# Patient Record
Sex: Male | Born: 1940 | Race: White | Hispanic: No | Marital: Married | Smoking: Never smoker
Health system: Southern US, Community
[De-identification: ages and names within clinical notes are randomized; demographics above are authoritative.]

## PROBLEM LIST (undated history)

## (undated) DIAGNOSIS — S065X9A Traumatic subdural hemorrhage with loss of consciousness of unspecified duration, initial encounter: Secondary | ICD-10-CM

## (undated) DIAGNOSIS — E785 Hyperlipidemia, unspecified: Secondary | ICD-10-CM

## (undated) DIAGNOSIS — I10 Essential (primary) hypertension: Secondary | ICD-10-CM

## (undated) DIAGNOSIS — I493 Ventricular premature depolarization: Secondary | ICD-10-CM

## (undated) DIAGNOSIS — E1140 Type 2 diabetes mellitus with diabetic neuropathy, unspecified: Secondary | ICD-10-CM

## (undated) DIAGNOSIS — M1990 Unspecified osteoarthritis, unspecified site: Secondary | ICD-10-CM

## (undated) DIAGNOSIS — I2510 Atherosclerotic heart disease of native coronary artery without angina pectoris: Secondary | ICD-10-CM

## (undated) DIAGNOSIS — E669 Obesity, unspecified: Secondary | ICD-10-CM

## (undated) DIAGNOSIS — N189 Chronic kidney disease, unspecified: Secondary | ICD-10-CM

## (undated) DIAGNOSIS — S301XXA Contusion of abdominal wall, initial encounter: Secondary | ICD-10-CM

## (undated) HISTORY — DX: Essential (primary) hypertension: I10

## (undated) HISTORY — DX: Contusion of abdominal wall, initial encounter: S30.1XXA

## (undated) HISTORY — DX: Hyperlipidemia, unspecified: E78.5

## (undated) HISTORY — DX: Ventricular premature depolarization: I49.3

## (undated) HISTORY — DX: Traumatic subdural hemorrhage with loss of consciousness of unspecified duration, initial encounter: S06.5X9A

## (undated) HISTORY — DX: Chronic kidney disease, unspecified: N18.9

## (undated) HISTORY — PX: HYPOTHENAR FAT PAD TRANSFER: SHX6408

## (undated) HISTORY — DX: Obesity, unspecified: E66.9

## (undated) HISTORY — DX: Type 2 diabetes mellitus with diabetic neuropathy, unspecified: E11.40

## (undated) NOTE — *Deleted (*Deleted)
NAME:  Roberto Haley, MRN:  [id number], DOB:  12/28/1940, LOS: 0 ADMISSION DATE:  06/28/2020, CONSULTATION DATE:  11/9 REFERRING MD:  Dr. Lorrin Goodell (Neurology), CHIEF COMPLAINT:  COVID/Stroke   Brief History   72 year old male admitted to [facility] with COVID pneumonia. He had abrupt change in mental status diagnosed as stroke. Transferred to [name] 11/9 for MRI/MRA.  History of present illness   28 year old male with PMH as below, which is significant for CKD, DM, HTN, and DM2. He has received both doses of [place] vaccination. Presented to [facility] ED 11/4 with complaints of SOB with oxygen saturations found to be in the 60s on room air. COVID tested positive. He was admitted and quickly escalated to requirement of 100% NRB. Treatment regimen included decadron, remdesivir, and baricitinib. He initially ahd improved and oxygen was weaning down. 11/8 he had acute change in mental status and CT was concerning for stroke. 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 No CTA due to contrast allergy. The outside hospital was unable to obtain MRA for COVID positive patient, and thus he was transferred to [facility] ICU for further evaluation. He was admitted to the neurology service and PCCM was consulted for concerns of respiratory decline.   Past Medical History   has a past medical history of Arthritis, Chronic kidney disease, Coronary artery disease, Diabetes mellitus, Groin hematoma (02/19/12), Hyperlipidemia, Hypertension, Neuropathy due to type 2 diabetes mellitus ([place]), Obesity, PVC (premature ventricular contraction), and SDH (subdural hematoma) ([place]) (02/06/2013).   Significant Hospital Events   11/4 admit 11/9 tx to [facility]  Consults:    Procedures:    Significant Diagnostic Tests:  11/8 CT head > Low-density in the left occipital lobe suggesting acute to subacute infarction.  Possible infarction in inferior left cerebellum.  No hemorrhage  or mass-effect.  Chronic small vessel ischemic changes elsewhere throughout the brain. 11/9 MRI/MRA >   Micro Data:    Antimicrobials:  ***  Interim history/subjective:    Objective   Blood pressure (!) 146/85, pulse (!) 106, temperature 98.1 F (36.7 C), temperature source Axillary, resp. rate (!) 26, SpO2 (!) 87 %.    FiO2 (%):  [100 %] 100 %   Intake/Output Summary (Last 24 hours) at 06/12/2020 2158 Last data filed at 06/15/2020 1900 Gross per 24 hour  Intake -  Output 50 ml  Net -50 ml   There were no vitals filed for this visit.  Examination: General: *** HENT: *** Lungs: *** Cardiovascular: *** Abdomen: *** Extremities: *** Neuro: *** GU: ***  Resolved Hospital Problem list     Assessment & Plan:   Acute hypoxemic respiratory failure secondary to COVID-10 pneumonia. - Supplemental O2 to keep SpO2 > 88% - may require intubation - ongoing steroids, baricitinib - s/p 5 days remdesivir   CVA - MRI pending - Plan per neurology  PAF on Eliquis CAD s/p CABG - Will transition to heparin infusion as he is not able to take PO - Amiodarone   Hypothyroid - home synthroid   DM - hold metformin - CBG monitoring and SSI   Best practice:  Diet: NPO Pain/Anxiety/Delirium protocol (if indicated): *** VAP protocol (if indicated): *** DVT prophylaxis: heparin infusion GI prophylaxis: PPI Glucose control: CBG monitoring and SSI Mobility: BR Code Status: FULL COBE Family Communication: *** Disposition: ICU  Labs   CBC: No results for input(s): WBC, NEUTROABS, HGB, HCT, MCV, PLT in the last 168 hours.  Basic Metabolic Panel: No results for input(s): NA, K, CL, CO2, GLUCOSE, BUN, CREATININE, CALCIUM, MG, PHOS in the last 168 hours. GFR: CrCl cannot be calculated (Patient's most recent lab result is older than the maximum 21 days allowed.). No results for input(s): PROCALCITON, WBC, LATICACIDVEN in the last 168 hours.  Liver Function Tests: No  results for input(s): AST, ALT, ALKPHOS, BILITOT, PROT, ALBUMIN in the last 168 hours. No results for input(s): LIPASE, AMYLASE in the last 168 hours. No results for input(s): AMMONIA in the last 168 hours.  ABG No results found for: PHART, PCO2ART, PO2ART, HCO3, TCO2, ACIDBASEDEF, O2SAT   Coagulation Profile: No results for input(s): INR, PROTIME in the last 168 hours.  Cardiac Enzymes: No results for input(s): CKTOTAL, CKMB, CKMBINDEX, TROPONINI in the last 168 hours.  HbA1C: Hgb A1c MFr Bld  Date/Time Value Ref Range Status  11/01/2014 12:00 AM 10.7 (A) 4.0 - 6.0 % Final    CBG: Recent Labs  Lab 06/10/2020 2107  GLUCAP 204*    Review of Systems:   ***  Past Medical History  He,  has a past medical history of Arthritis, Chronic kidney disease, Coronary artery disease, Diabetes mellitus, Groin hematoma (02/19/12), Hyperlipidemia, Hypertension, Neuropathy due to type 2 diabetes mellitus ([place]), Obesity, PVC (premature ventricular contraction), and SDH (subdural hematoma) ([place]) (02/06/2013).   Surgical History    Past Surgical History:  Procedure Laterality Date  . CARDIAC CATHETERIZATION  01/14/12   LV FXN EF 50-55% W/MILD DISTAL INFERIOR HYPOKINESIS; PCI: LAD PTCA/STENT W/NEW 2.75X22 RESOLUTE DES IN MID LAD POST DIALTED TO 3.08 TO 2.97 TAPER: 99%-80% TO 0; LCX: PTCA VIA LM STENT W/2.25X12 SPRINTER BALLOON: 90%-5; LAD: PATENT PROXIMAL STENT EXTENDING INTO LM W/30-40% SMOOTH NARROWING IN DISTAL PORTION OF STENT; 99% IN STENT RESTENOSIS OF MID LAD STENT   . CARDIAC CATHETERIZATION N/A 12/02/2015   Procedure: Left Heart Cath and Coronary Angiography;  Surgeon: Troy Sine, MD;  Location: [place] CV LAB;  Service: Cardiovascular;  Laterality: N/A;  . CARDIAC CATHETERIZATION N/A 12/02/2015   Procedure: Coronary Stent Intervention;  Surgeon: Troy Sine, MD;  Location: [place] CV LAB;  Service: Cardiovascular;  Laterality: N/A;  . CARDIOVERSION N/A 09/26/2018   Procedure:  CARDIOVERSION;  Surgeon: Troy Sine, MD;  Location: [facility] ENDOSCOPY;  Service: Cardiovascular;  Laterality: N/A;  . CORONARY ARTERY BYPASS GRAFT  1997   LIMA to the LAD and vein to the RCA;  . CORONARY STENT PLACEMENT  12/02/2015   PAD  . DOPPLER ECHOCARDIOGRAPHY  01/09/12   LV NORMAL IN SIZE, NORMAL WAL THICKNESS, EF 50-55%; MILD POSTERIOR WALL HYOPKINESIS, THERE IS MILD INFERIOR WALL HYPOKINESIS  . HYPOTHENAR FAT PAD TRANSFER    . LEFT HEART CATHETERIZATION WITH CORONARY/GRAFT ANGIOGRAM N/A 01/14/2012   Procedure: LEFT HEART CATHETERIZATION WITH Beatrix Fetters;  Surgeon: Troy Sine, MD;  Location: [facility] CATH LAB;  Service: Cardiovascular;  Laterality: N/A;  . LOWER ARTERIAL DOPPLER  01/24/12   ESSENTIALLY NORMAL RIGHT GROIN DUPLEX EVALUATION S/P THROMBIN INJECTION  . LOWER VENOUS DUPLEX  02/05/12   ESSENTIALLY NORMAL RIGHT LOWER EXTRIMTY VENOUS DUPLEX DOPPLER EVALUATION  . NM MYOVIEW LTD  01/09/12   HIGH RISK SCAN. COMPARED TO PREVIOUS STUDY, THERE IS NOW ISCHEMIA PRESENT. ABNORMAL MYOCARDIAL PERFUSION STUDY. THERE IS NEW MILD INFEROLATERAL ISCHEMIA TOWARDS THE BASE; PT TO FOLLOW UP WITH DR. [name]  . PERCUTANEOUS CORONARY STENT INTERVENTION (PCI-S) N/A 01/14/2012   Procedure: PERCUTANEOUS CORONARY STENT INTERVENTION (PCI-S);  Surgeon: Troy Sine, MD;  Location: [place] CATH LAB;  Service: Cardiovascular;  Laterality: N/A;     Social History   reports that he has never smoked. He has never used smokeless tobacco. He reports that he does not drink alcohol and does not use drugs.   Family History   His family history includes Cancer in his father; Diabetes in his paternal aunt. There is no history of Heart disease.   Allergies Allergies  Allergen Reactions  . Diltiazem Other (See Comments)    Unknown  . Lovastatin Other (See Comments)    Unknown  . Proton Pump Inhibitors Other (See Comments)    Unknown   . Tramadol Nausea And Vomiting  . Codeine Rash     Home Medications  Prior  to Admission medications   Medication Sig Start Date End Date Taking? Authorizing Provider  atorvastatin (LIPITOR) 40 MG tablet TAKE 1 TABLET(40 MG) BY MOUTH DAILY 05/06/20   Troy Sine, MD  acetaminophen (TYLENOL) 500 MG tablet Take 500 mg by mouth every 6 (six) hours as needed.    [provider]  amiodarone (PACERONE) 200 MG tablet TAKE 1 TABLET(200 MG) BY MOUTH TWICE DAILY 06/06/20   Troy Sine, MD  amLODipine (NORVASC) 5 MG tablet Take 1 tablet (5 mg total) by mouth daily. 03/14/20 06/12/20  Troy Sine, MD  benazepril (LOTENSIN) 40 MG tablet Take 1 tablet by mouth daily. 01/19/19   [provider]  cholecalciferol (VITAMIN D) 1000 UNITS tablet Take 1,000 Units by mouth daily.    [provider]  clopidogrel (PLAVIX) 75 MG tablet TAKE 1 TABLET(75 MG) BY MOUTH DAILY WITH BREAKFAST 01/05/20   Troy Sine, MD  DUREZOL 0.05 % EMUL  06/23/19   [provider]  ELIQUIS 5 MG TABS tablet TAKE 1 TABLET(5 MG) BY MOUTH TWICE DAILY 04/04/20   Troy Sine, MD  furosemide (LASIX) 40 MG tablet Take one tablet (40 mg) by mouth every morning and half a tablet (20 mg) by mouth EVERY afternoon. 04/22/20   Troy Sine, MD  hydrALAZINE (APRESOLINE) 50 MG tablet TAKE 1 AND 1/2 TABLETS(75 MG) BY MOUTH TWICE DAILY 06/06/20   Troy Sine, MD  insulin aspart (NOVOLOG) 100 UNIT/ML FlexPen Inject 14 Units into the skin 2 (two) times daily with a meal.     [provider]  isosorbide mononitrate (IMDUR) 60 MG 24 hr tablet TAKE 1 AND 1/2 TABLETS BY MOUTH EVERY AM AND TAKE 0.5 TABLET BY MOUTH EVERY EVENING 01/05/20   Troy Sine, MD  ketoconazole (NIZORAL) 2 % cream Apply 1 fingertip amount to each foot daily. 12/29/19   Evelina Bucy, DPM  Lancets (ONETOUCH DELICA PLUS [id number]) MISC USE AS DIRECTED TO TEST BLOOD GLUCOSE BID 05/17/19   [provider]  LEVEMIR FLEXTOUCH 100 UNIT/ML Pen Inject 44 Units into the skin 2 (two) times daily.  10/07/14    [provider]  levothyroxine (SYNTHROID) 150 MCG tablet Take 150 mcg by mouth every morning. 10/13/19   [provider]  LINZESS 145 MCG CAPS capsule Take 145 mcg by mouth daily before breakfast.  11/15/15   [provider]  metFORMIN (GLUCOPHAGE-XR) 750 MG 24 hr tablet Take 750 mg by mouth daily with breakfast.    [provider]  metoprolol succinate (TOPROL-XL) 50 MG 24 hr tablet TAKE 1 TABLET(50 MG) BY MOUTH DAILY 04/15/20   Troy Sine, MD  Multiple Vitamin (MULTIVITAMIN WITH MINERALS) TABS Take 1 tablet by mouth daily.  [provider]  nitroGLYCERIN (NITROSTAT) 0.4 MG SL tablet Place 1 tablet (0.4 mg total) under the tongue every 5 (five) minutes as needed. For chest pain 12/03/15 04/22/20  Barrett, Evelene Croon, PA-C  NOVOFINE AUTOCOVER 30G X 8 MM MISC  05/26/14   [provider]  ofloxacin (OCUFLOX) 0.3 % ophthalmic solution  06/23/19   [provider]  ONE TOUCH ULTRA TEST test strip  09/01/14   [provider]  oxybutynin (DITROPAN) 5 MG tablet Take 1 tablet by mouth daily. 06/26/13   [provider]  potassium chloride SA (KLOR-CON) 20 MEQ tablet TAKE 1 TABLET BY MOUTH DAILY AS NEEDED 02/09/20   Almyra Deforest, PA  TRULICITY 1.5 0000000 SOPN Inject 1.5 mg as directed once a week.  03/25/15   [provider]  vitamin C (ASCORBIC ACID) 500 MG tablet Take 500 mg by mouth daily.    [provider]     Critical care time: ***      Georgann Housekeeper, AGACNP-BC [place] for personal pager PCCM on call pager [phone]  06/28/2020 9:58 PM

---

## 1995-08-07 HISTORY — PX: CORONARY ARTERY BYPASS GRAFT: SHX141

## 2002-11-16 ENCOUNTER — Inpatient Hospital Stay (HOSPITAL_COMMUNITY): Admission: EM | Admit: 2002-11-16 | Discharge: 2002-11-18 | Payer: Self-pay | Admitting: Emergency Medicine

## 2011-09-26 DIAGNOSIS — E119 Type 2 diabetes mellitus without complications: Secondary | ICD-10-CM | POA: Diagnosis not present

## 2011-09-26 DIAGNOSIS — L84 Corns and callosities: Secondary | ICD-10-CM | POA: Diagnosis not present

## 2011-10-23 DIAGNOSIS — J019 Acute sinusitis, unspecified: Secondary | ICD-10-CM | POA: Diagnosis not present

## 2011-11-07 DIAGNOSIS — Z125 Encounter for screening for malignant neoplasm of prostate: Secondary | ICD-10-CM | POA: Diagnosis not present

## 2011-11-07 DIAGNOSIS — E785 Hyperlipidemia, unspecified: Secondary | ICD-10-CM | POA: Diagnosis not present

## 2011-11-07 DIAGNOSIS — Z79899 Other long term (current) drug therapy: Secondary | ICD-10-CM | POA: Diagnosis not present

## 2011-11-07 DIAGNOSIS — I10 Essential (primary) hypertension: Secondary | ICD-10-CM | POA: Diagnosis not present

## 2011-11-07 DIAGNOSIS — E119 Type 2 diabetes mellitus without complications: Secondary | ICD-10-CM | POA: Diagnosis not present

## 2011-11-07 DIAGNOSIS — Z6835 Body mass index (BMI) 35.0-35.9, adult: Secondary | ICD-10-CM | POA: Diagnosis not present

## 2011-12-24 DIAGNOSIS — E785 Hyperlipidemia, unspecified: Secondary | ICD-10-CM | POA: Diagnosis not present

## 2011-12-24 DIAGNOSIS — R079 Chest pain, unspecified: Secondary | ICD-10-CM | POA: Diagnosis not present

## 2011-12-24 DIAGNOSIS — I2510 Atherosclerotic heart disease of native coronary artery without angina pectoris: Secondary | ICD-10-CM | POA: Diagnosis not present

## 2011-12-24 DIAGNOSIS — E119 Type 2 diabetes mellitus without complications: Secondary | ICD-10-CM | POA: Diagnosis not present

## 2012-01-09 DIAGNOSIS — R0602 Shortness of breath: Secondary | ICD-10-CM | POA: Diagnosis not present

## 2012-01-09 DIAGNOSIS — E785 Hyperlipidemia, unspecified: Secondary | ICD-10-CM | POA: Diagnosis not present

## 2012-01-09 DIAGNOSIS — R079 Chest pain, unspecified: Secondary | ICD-10-CM | POA: Diagnosis not present

## 2012-01-09 DIAGNOSIS — I2510 Atherosclerotic heart disease of native coronary artery without angina pectoris: Secondary | ICD-10-CM | POA: Diagnosis not present

## 2012-01-09 DIAGNOSIS — R0609 Other forms of dyspnea: Secondary | ICD-10-CM | POA: Diagnosis not present

## 2012-01-09 DIAGNOSIS — R42 Dizziness and giddiness: Secondary | ICD-10-CM | POA: Diagnosis not present

## 2012-01-09 DIAGNOSIS — Z79899 Other long term (current) drug therapy: Secondary | ICD-10-CM | POA: Diagnosis not present

## 2012-01-09 HISTORY — PX: DOPPLER ECHOCARDIOGRAPHY: SHX263

## 2012-01-09 HISTORY — PX: NM MYOVIEW LTD: HXRAD82

## 2012-01-11 ENCOUNTER — Ambulatory Visit
Admission: RE | Admit: 2012-01-11 | Discharge: 2012-01-11 | Disposition: A | Discharge status: Home / Self Care | Payer: Medicare Other | Source: Ambulatory Visit | Attending: Cardiovascular Disease | Admitting: Cardiovascular Disease

## 2012-01-11 ENCOUNTER — Other Ambulatory Visit: Payer: Self-pay | Admitting: Cardiovascular Disease

## 2012-01-11 DIAGNOSIS — J984 Other disorders of lung: Secondary | ICD-10-CM | POA: Diagnosis not present

## 2012-01-11 DIAGNOSIS — Z01811 Encounter for preprocedural respiratory examination: Secondary | ICD-10-CM

## 2012-01-11 DIAGNOSIS — E119 Type 2 diabetes mellitus without complications: Secondary | ICD-10-CM | POA: Diagnosis not present

## 2012-01-11 DIAGNOSIS — R9430 Abnormal result of cardiovascular function study, unspecified: Secondary | ICD-10-CM | POA: Diagnosis not present

## 2012-01-11 DIAGNOSIS — I517 Cardiomegaly: Secondary | ICD-10-CM | POA: Diagnosis not present

## 2012-01-11 DIAGNOSIS — D689 Coagulation defect, unspecified: Secondary | ICD-10-CM | POA: Diagnosis not present

## 2012-01-11 DIAGNOSIS — Z01818 Encounter for other preprocedural examination: Secondary | ICD-10-CM | POA: Diagnosis not present

## 2012-01-11 DIAGNOSIS — R079 Chest pain, unspecified: Secondary | ICD-10-CM | POA: Diagnosis not present

## 2012-01-11 IMAGING — CR DG CHEST 2V
2 series · 2 of 2 positions shown · non-contrast
Comparison: None.

CLINICAL DATA: Preop for cardiac catheterization.  Chest pain and
shortness of breath.  Prior median sternotomy for CABG.

CHEST - 2 VIEW

[w chest pa]
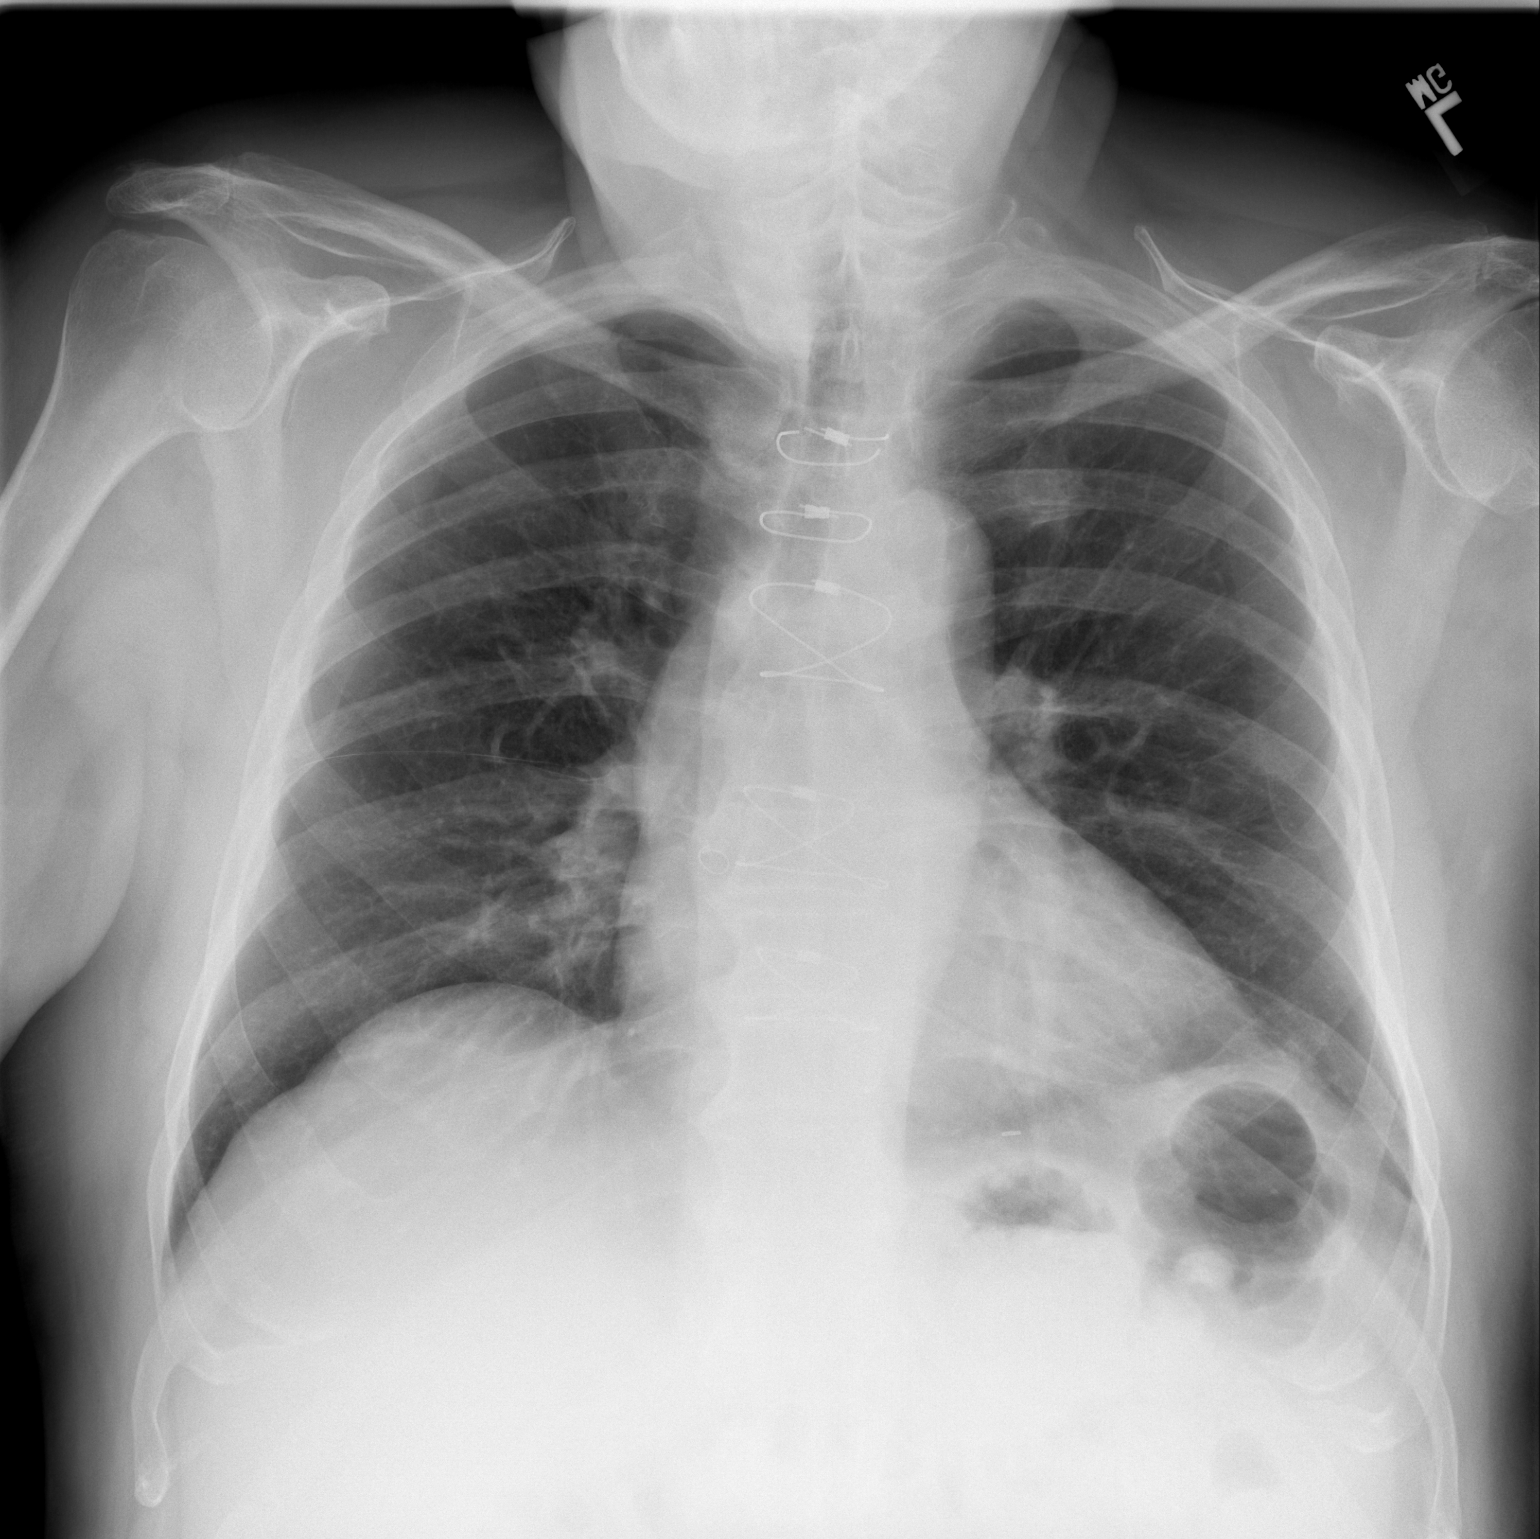

[w chest lat]
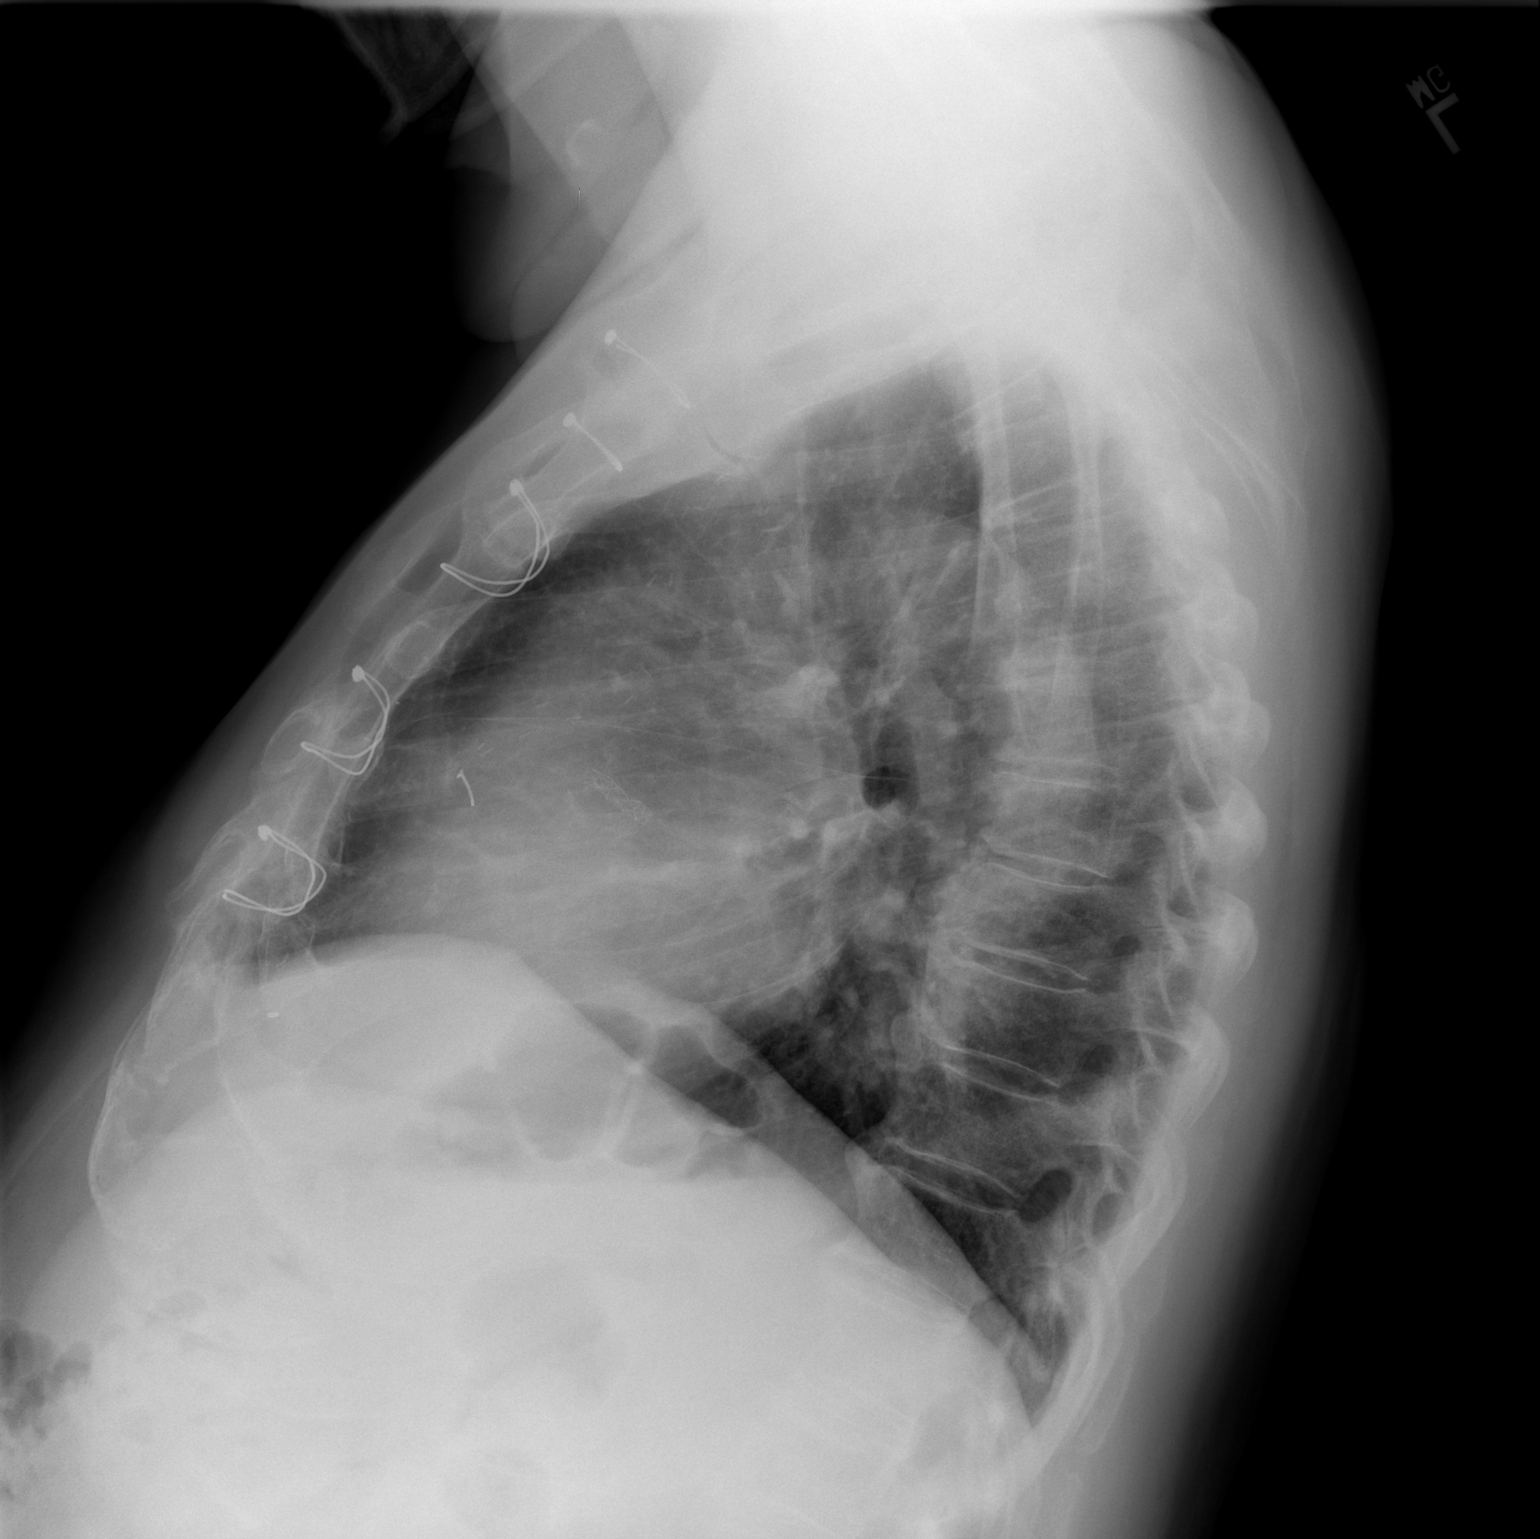

[2 of 2 positions shown; findings below may reference images not displayed]

FINDINGS: Prior median sternotomy.  Moderate lower thoracic
spondylosis.  The superior-most mediastinal wire is fractured.
Mild cardiomegaly. Mediastinal contours otherwise within normal
limits.  No congestive failure.

Mild left base volume loss/scarring.
IMPRESSION: Cardiomegaly without congestive failure.

## 2012-01-14 ENCOUNTER — Ambulatory Visit (HOSPITAL_COMMUNITY)
Admission: RE | Admit: 2012-01-14 | Discharge: 2012-01-16 | Disposition: A | Discharge status: Home / Self Care | Payer: Medicare Other | Source: Ambulatory Visit | Attending: Cardiovascular Disease | Admitting: Cardiovascular Disease

## 2012-01-14 ENCOUNTER — Encounter (HOSPITAL_COMMUNITY): Payer: Self-pay | Admitting: *Deleted

## 2012-01-14 ENCOUNTER — Encounter: Payer: Self-pay | Admitting: Cardiovascular Disease

## 2012-01-14 ENCOUNTER — Encounter (HOSPITAL_COMMUNITY)
Admission: RE | Disposition: A | Discharge status: Home / Self Care | Payer: Self-pay | Source: Ambulatory Visit | Attending: Cardiovascular Disease

## 2012-01-14 DIAGNOSIS — E119 Type 2 diabetes mellitus without complications: Secondary | ICD-10-CM | POA: Insufficient documentation

## 2012-01-14 DIAGNOSIS — I2510 Atherosclerotic heart disease of native coronary artery without angina pectoris: Secondary | ICD-10-CM | POA: Diagnosis not present

## 2012-01-14 DIAGNOSIS — N289 Disorder of kidney and ureter, unspecified: Secondary | ICD-10-CM | POA: Diagnosis present

## 2012-01-14 DIAGNOSIS — I25810 Atherosclerosis of coronary artery bypass graft(s) without angina pectoris: Secondary | ICD-10-CM | POA: Diagnosis not present

## 2012-01-14 DIAGNOSIS — E785 Hyperlipidemia, unspecified: Secondary | ICD-10-CM | POA: Insufficient documentation

## 2012-01-14 DIAGNOSIS — R9430 Abnormal result of cardiovascular function study, unspecified: Secondary | ICD-10-CM | POA: Diagnosis not present

## 2012-01-14 DIAGNOSIS — T82897A Other specified complication of cardiac prosthetic devices, implants and grafts, initial encounter: Secondary | ICD-10-CM | POA: Insufficient documentation

## 2012-01-14 DIAGNOSIS — Y831 Surgical operation with implant of artificial internal device as the cause of abnormal reaction of the patient, or of later complication, without mention of misadventure at the time of the procedure: Secondary | ICD-10-CM | POA: Insufficient documentation

## 2012-01-14 DIAGNOSIS — R079 Chest pain, unspecified: Secondary | ICD-10-CM | POA: Diagnosis not present

## 2012-01-14 DIAGNOSIS — I10 Essential (primary) hypertension: Secondary | ICD-10-CM | POA: Diagnosis present

## 2012-01-14 DIAGNOSIS — Z955 Presence of coronary angioplasty implant and graft: Secondary | ICD-10-CM

## 2012-01-14 DIAGNOSIS — I493 Ventricular premature depolarization: Secondary | ICD-10-CM | POA: Diagnosis present

## 2012-01-14 HISTORY — PX: PERCUTANEOUS CORONARY STENT INTERVENTION (PCI-S): SHX5485

## 2012-01-14 HISTORY — DX: Atherosclerotic heart disease of native coronary artery without angina pectoris: I25.10

## 2012-01-14 HISTORY — PX: LEFT HEART CATHETERIZATION WITH CORONARY/GRAFT ANGIOGRAM: SHX5450

## 2012-01-14 HISTORY — PX: CARDIAC CATHETERIZATION: SHX172

## 2012-01-14 LAB — GLUCOSE, CAPILLARY: Glucose-Capillary: 189 mg/dL — ABNORMAL HIGH (ref 70–99)

## 2012-01-14 SURGERY — LEFT HEART CATHETERIZATION WITH CORONARY/GRAFT ANGIOGRAM
Anesthesia: LOCAL

## 2012-01-14 MED ORDER — MIDAZOLAM HCL 2 MG/2ML IJ SOLN
INTRAMUSCULAR | Status: AC
  Filled 2012-01-14: qty 2

## 2012-01-14 MED ORDER — SODIUM CHLORIDE 0.9 % IV SOLN
INTRAVENOUS | Status: DC
  Administered 2012-01-14: 08:00:00 via INTRAVENOUS

## 2012-01-14 MED ORDER — SODIUM CHLORIDE 0.9 % IV SOLN: 250.0000 mL | INTRAVENOUS | Status: DC | PRN

## 2012-01-14 MED ORDER — SODIUM CHLORIDE 0.9 % IJ SOLN: 3.0000 mL | Freq: Two times a day (BID) | INTRAMUSCULAR | Status: DC

## 2012-01-14 MED ORDER — ACETAMINOPHEN 325 MG PO TABS
650.0000 mg | ORAL_TABLET | ORAL | Status: DC | PRN
  Administered 2012-01-15: 650 mg via ORAL
  Filled 2012-01-14: qty 2

## 2012-01-14 MED ORDER — CLOPIDOGREL BISULFATE 75 MG PO TABS: 75.0000 mg | ORAL_TABLET | Freq: Every day | ORAL | Status: DC

## 2012-01-14 MED ORDER — FELODIPINE ER 5 MG PO TB24
5.0000 mg | ORAL_TABLET | Freq: Every day | ORAL | Status: DC
  Administered 2012-01-15 – 2012-01-16 (×2): 5 mg via ORAL
  Filled 2012-01-14 (×3): qty 1

## 2012-01-14 MED ORDER — DIAZEPAM 5 MG PO TABS
ORAL_TABLET | ORAL | Status: AC
  Administered 2012-01-14: 5 mg
  Filled 2012-01-14: qty 1

## 2012-01-14 MED ORDER — SODIUM CHLORIDE 0.9 % IV SOLN
1.7500 mg/kg/h | INTRAVENOUS | Status: AC
  Administered 2012-01-14: 1.75 mg/kg/h via INTRAVENOUS
  Filled 2012-01-14: qty 250

## 2012-01-14 MED ORDER — BIVALIRUDIN 250 MG IV SOLR
INTRAVENOUS | Status: AC
  Filled 2012-01-14: qty 250

## 2012-01-14 MED ORDER — FENTANYL CITRATE 0.05 MG/ML IJ SOLN
INTRAMUSCULAR | Status: AC
  Filled 2012-01-14: qty 2

## 2012-01-14 MED ORDER — ATORVASTATIN CALCIUM 40 MG PO TABS
40.0000 mg | ORAL_TABLET | Freq: Every day | ORAL | Status: DC
  Filled 2012-01-14 (×3): qty 1

## 2012-01-14 MED ORDER — RANOLAZINE ER 500 MG PO TB12
500.0000 mg | ORAL_TABLET | Freq: Two times a day (BID) | ORAL | Status: DC
  Administered 2012-01-14 – 2012-01-16 (×4): 500 mg via ORAL
  Filled 2012-01-14 (×8): qty 1

## 2012-01-14 MED ORDER — NITROGLYCERIN 0.2 MG/ML ON CALL CATH LAB
INTRAVENOUS | Status: AC
  Filled 2012-01-14: qty 1

## 2012-01-14 MED ORDER — ATORVASTATIN CALCIUM 40 MG PO TABS
40.0000 mg | ORAL_TABLET | Freq: Every day | ORAL | Status: DC
  Filled 2012-01-14 (×2): qty 1

## 2012-01-14 MED ORDER — MORPHINE SULFATE 4 MG/ML IJ SOLN: 2.0000 mg | INTRAMUSCULAR | Status: DC | PRN

## 2012-01-14 MED ORDER — HEPARIN (PORCINE) IN NACL 2-0.9 UNIT/ML-% IJ SOLN
INTRAMUSCULAR | Status: AC
  Filled 2012-01-14: qty 2000

## 2012-01-14 MED ORDER — VITAMIN C 500 MG PO TABS
500.0000 mg | ORAL_TABLET | Freq: Every evening | ORAL | Status: DC | PRN
  Administered 2012-01-14 – 2012-01-15 (×2): 500 mg via ORAL
  Filled 2012-01-14 (×6): qty 1

## 2012-01-14 MED ORDER — METOPROLOL SUCCINATE ER 50 MG PO TB24
50.0000 mg | ORAL_TABLET | Freq: Every day | ORAL | Status: DC
  Administered 2012-01-14 – 2012-01-15 (×2): 50 mg via ORAL
  Filled 2012-01-14 (×3): qty 1

## 2012-01-14 MED ORDER — NITROGLYCERIN IN D5W 200-5 MCG/ML-% IV SOLN
20.0000 ug/min | INTRAVENOUS | Status: DC
  Administered 2012-01-14: 20 ug/min via INTRAVENOUS

## 2012-01-14 MED ORDER — ISOSORBIDE DINITRATE 30 MG PO TABS
30.0000 mg | ORAL_TABLET | Freq: Every day | ORAL | Status: DC
  Administered 2012-01-15 – 2012-01-16 (×2): 30 mg via ORAL
  Filled 2012-01-14 (×3): qty 1

## 2012-01-14 MED ORDER — ASPIRIN 81 MG PO CHEW
CHEWABLE_TABLET | ORAL | Status: AC
  Administered 2012-01-14: 324 mg via ORAL
  Filled 2012-01-14: qty 4

## 2012-01-14 MED ORDER — LIDOCAINE HCL (PF) 1 % IJ SOLN
INTRAMUSCULAR | Status: AC
  Filled 2012-01-14: qty 30

## 2012-01-14 MED ORDER — CLOPIDOGREL BISULFATE 75 MG PO TABS
75.0000 mg | ORAL_TABLET | Freq: Every day | ORAL | Status: DC
  Administered 2012-01-15 – 2012-01-16 (×2): 75 mg via ORAL
  Filled 2012-01-14 (×2): qty 1

## 2012-01-14 MED ORDER — DIAZEPAM 5 MG PO TABS: 5.0000 mg | ORAL_TABLET | ORAL | Status: DC

## 2012-01-14 MED ORDER — METOPROLOL SUCCINATE ER 50 MG PO TB24: 50.0000 mg | ORAL_TABLET | Freq: Two times a day (BID) | ORAL | Status: DC

## 2012-01-14 MED ORDER — SODIUM CHLORIDE 0.9 % IV SOLN
INTRAVENOUS | Status: DC
  Administered 2012-01-14 (×2): via INTRAVENOUS

## 2012-01-14 MED ORDER — INSULIN ASPART 100 UNIT/ML ~~LOC~~ SOLN
0.0000 [IU] | Freq: Three times a day (TID) | SUBCUTANEOUS | Status: DC
  Administered 2012-01-14: 8 [IU] via SUBCUTANEOUS
  Administered 2012-01-15 (×2): 2 [IU] via SUBCUTANEOUS
  Administered 2012-01-15: 3 [IU] via SUBCUTANEOUS
  Administered 2012-01-16: 5 [IU] via SUBCUTANEOUS

## 2012-01-14 MED ORDER — ONDANSETRON HCL 4 MG/2ML IJ SOLN: 4.0000 mg | Freq: Four times a day (QID) | INTRAMUSCULAR | Status: DC | PRN

## 2012-01-14 MED ORDER — SODIUM CHLORIDE 0.9 % IJ SOLN: 3.0000 mL | INTRAMUSCULAR | Status: DC | PRN

## 2012-01-14 MED ORDER — ASPIRIN 81 MG PO CHEW
324.0000 mg | CHEWABLE_TABLET | ORAL | Status: AC
  Administered 2012-01-14: 324 mg via ORAL

## 2012-01-14 MED ORDER — ASPIRIN EC 81 MG PO TBEC
81.0000 mg | DELAYED_RELEASE_TABLET | Freq: Every day | ORAL | Status: DC
  Administered 2012-01-16: 81 mg via ORAL
  Filled 2012-01-14 (×3): qty 1

## 2012-01-14 MED ORDER — METOPROLOL SUCCINATE ER 100 MG PO TB24
100.0000 mg | ORAL_TABLET | Freq: Every day | ORAL | Status: DC
  Administered 2012-01-15 – 2012-01-16 (×2): 100 mg via ORAL
  Filled 2012-01-14 (×3): qty 1

## 2012-01-14 MED ORDER — ASPIRIN 81 MG PO TABS: 81.0000 mg | ORAL_TABLET | Freq: Every day | ORAL | Status: DC

## 2012-01-14 NOTE — Progress Notes (Signed)
Site area: right groin  Site Prior to Removal:  Level 0  Pressure Applied For 84 MINUTES    Minutes Beginning at 1445  Manual:   yes  Patient Status During Pull:  stable  Post Pull Groin Site:  Level 1  Post Pull Instructions Given:  yes  Post Pull Pulses Present:  yes  Dressing Applied:  yes  Comments: Extra time for holding due bleeding and developed a hematoma.  Hematoma soften then reappeared again held  for additional time and a total of 45 min.

## 2012-01-14 NOTE — CV Procedure (Signed)
Cardiac Catherization and 2 vessel PCI   Roberto Haley, 72 y.o., male  Full note dictated; see diagram in chart  DICTATION # [phone], [id number]  LM: nl with patent stent in distal aspect from LAD LAD: patent proximal stent extending into LM with 30-405 smooth narrowing in distal portion of stent; 99% in stent restenosis of mid LAD strent LCX: 90 new mid stenosi RCA: diffuse 40 - 50% lesions SVG-PDA: patent Old occluded LIMA graft  LV FXN EF 50-55% with mild distal inferior hypokinesis AOrtoilis: nl  PCI: LAD PTCA/stent WITH NEW 2.75X22 resolute des IN MID LAD post dilated to 3.08 to 2.97 taper: 99%-80% to 0         LCX: PTCA via LM stent with 2.25x12 Sprinter balloon: 90% - 5  Roberto Barnard A, MD, [facility] 01/14/2012 11:30 AM

## 2012-01-14 NOTE — Cardiovascular Report (Signed)
NAME:  ASHFORD, MEDCALF NO.:  [id number]  MEDICAL RECORD NO.:  [id number]  LOCATION:  [facility]                         FACILITY:  [place]  PHYSICIAN:  Shelva Majestic, M.D.     DATE OF BIRTH:  1940/12/23  DATE OF PROCEDURE:  01/14/2012 DATE OF DISCHARGE:                           CARDIAC CATHETERIZATION   INDICATIONS:  Mr. Yechezkel Salva is a 71 year old gentleman who underwent CABG revascularization surgery in 1997.  At which time, he had a LIMA placed to the LAD and a vein graft to his right coronary artery.  He developed significant stenosis in his LIMA graft, which ultimately led to conclusion and consequently, in March 1998, he underwent complex coronary artery intervention with high-speed rotational atherectomy and stenting of his LAD and PTCA of his diagonal vessel.  His last catheterization was in April 2004.  At that time, he did have a patent stent at the LAD ostium, which also extended to cover the distal portion of the left main.  He had a patent LAD stent proximally and a patent diagonal vessel with an occluded small second diagonal 2.  At that time, he had 99% stenosis in the mid LAD and he underwent stenting at this site with a 2.75 x 16 mm Taxus stent.  He had mild disease in the circumflex.  He had a patent RCA graft to the right coronary artery.  In 2007, the patient had experienced 7 episodes of recurrent chest pain and he markedly benefit from EECP therapy.  He has a history of diabetes mellitus, hyperlipidemia, hypertension.  Recently, he has noticed more episodes of chest discomfort.  A nuclear perfusion study on January 09, 2012, showed an old defect in the distal anteroseptal region, but now he had additional ischemia in inferolateral region.  The patient now presents for definitive cardiac catheterization.  PROCEDURE:  After premedication with Versed 2 mg plus fentanyl 25 mcg, the patient was prepped and draped in usual fashion.  His right  femoral artery is punctured anteriorly and a 5-French sheath was inserted without difficulty.  Diagnostic cardiac catheterization was done utilizing 5-French Judkins 4 left and right coronary catheters.  Right bypass graft catheter was necessary for optimal angiography into the right vein graft.  Selective angiography was also performed to the left subclavian artery to reconfirm the LIMA occlusion.  A 5-French pigtail catheter was used for RAO ventriculography.  With the patient's risk factors of hypertension history, distal aortography was also performed to further evaluate his renal arteries.  At this time, I reviewed all angiographic findings with the patient.  He did have a 95-99% in-stent restenosis extending proximally to the mid LAD stent with mild narrowing in the proximal stent.  He also had developed progressive 90% stenosis in the bypass circumflex vessel, which was jailed by the stent in the distal left main.  The decision was made to attempt intervention.  The arterial sheath was upgraded to a 6-French sheath.  The patient received an additional 1 mg of Versed plus 25 mcg of fentanyl.  Angiomax bolus plus infusion was administered.  The patient already had received 75 mg oral dose of Plavix this morning and received an additional 75  mg of Plavix in the laboratory.  After ACT was documented to be therapeutic.  The Prowater wire was able to be advanced down the LAD and across the 99% mid stenosis.  A 2.0 x 12 mm Emerge balloon was used for predilatation.  At this point, since it did appear perhaps that there may have been some air outside the stent narrowed, decision was made to cover the entire previously placed 2.75 x 16 mm Taxus stent with a 2.75 x 22 mm Resolute DES stent.  This was deployed x2 up to 13 atmospheres. A 3.0 x 15 mm noncompliant sprinter balloon was used for post-stent dilatation with a tapering from 3.8 down to 2.97.  [facility] angiography confirmed an  excellent angiographic result.  At this point, the decision was made to attempt balloon angioplasty to the native circumflex vessel.  The Prowater wire was able to traverse the stent strut and navigate down the stent strut into the distal left main.  I was able to navigate down beyond the mid circumflex stenosis. A 2.25 x 12 mm sprinter balloon was used and 2 long inflations were made up to a maximum of 2 minutes, which resulted in excellent PTCA result. Decision was made to keep the patient on the bivalirudin until the current bag runs out.  The arterial sheath was sutured in place with plans for sheath removal later today.  HEMODYNAMIC DATA:  Central aortic pressure was 155/76, left ventricle pressure 155/80, post A-wave 27.  During the procedure, aortic pressure did increase to 176.  The patient was started on IC and IV nitroglycerin and was titrated up to 20 mcg.  ANGIOGRAPHIC DATA:  Left main coronary artery was angiographically normal and bifurcated into an LAD and left circumflex system.  The LAD had a stent proximally extending ostially and actually covering the distal left main.  This stent was patent, but there was narrowing, that was 30-40%, in the distal aspect of the stent just beyond the diagonal takeoff.  The diagonal vessel, the first was large caliber and was not restenosed.  Second diagonal vessel was very small caliber and unchanged from previously.  At the site of the mid LAD stent, there was 99% in-stent restenosis, followed by 80% in-stent restenosis.  The circumflex vessel gave rise to 2 high-marginal vessels.  The second high-marginal vessel had 40-50% narrowing.  The mid AV groove circumflex had 90% focal stenosis.  The right coronary artery had diffuse 40-50% stenoses proximally and 40% stenosis after the acute marginal branch and diffuse 40-50% stenosis distally.  The vein graft supplying the right coronary artery was widely patent and anastomosed into  the very proximal PDA.  There was luminal irregularities of 30-40% in the PDA vessel supplied by this graft.  The LIMA was again confirmed to be occluded.  RAO ventriculography revealed preserved global LV function with mild mid distal  inferior hypocontractility.  Distal aortography did reveal widely patent renal arteries without evidence for stenosis.  There was no evidence for aneurysm.  Following intervention to the LAD with PTCA and ultimate stenting with insertion of a 2.75 x 22 mm Resolute DES stent post-dilated with the stent tapered from 3.08 to 2.97, the entire region of 80-90% stenoses, were reduced to 0%.  With reference to the circumflex vessel, the 90% stenosis underwent successful PTCA with residual narrowing of 5% with brisk TIMI-3 flow and no evidence for dissection.  IMPRESSION: 1. Normal left ventricular function with mild distal inferior     hypocontractility. 2. Multivessel  coronary artery disease with widely patent stent in the     left anterior descending proximal ostium segment extending into the     distal left main with smooth 30-40% narrowing in the distal aspect     of the stent, no evidence for significant in-stent restenosis in     the mid left anterior descending stent, which had been placed in     2004 (2.75 x 16 mm Taxus stent.) 3. New 90% stenosis in the atrioventricular groove circumflex with 40%     narrowing in the high-marginal obtuse marginal 2 vessel. 4. Diffuse narrowing in the right coronary artery with 40-50% stenosis     in the proximal-to-mid segment as well as distally. 5. Patent vein graft supplying the posterior descending artery with     scattered 30-40% posterior descending artery stenosis. 6. 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 7. Successful percutaneous transluminal coronary angioplasty/stenting     of the mid left anterior descending in-stent restenosis site with     ultimate insertion of a new 2.75 x 22 mm  Resolute drug-eluting     stent post-dilated with stent taper from 3.08 to 2.97, but the     stenoses being reduced to 0%. 8. Successful percutaneous transluminal coronary angioplasty of the     native circumflex vessel done via the stent strut in the distal     left main with a 90% stenosis being reduced to 5%. 9. Bivalirudin/IC and IV nitroglycerin/Plavix.          ______________________________ Shelva Majestic, M.D.     TK/MEDQ  D:  01/14/2012  T:  01/14/2012  Job:  [id number]

## 2012-01-14 NOTE — H&P (Signed)
  Updated H&P:  See dictated note from office visit of 01/11/12.  Pt has tolerated increased medical therapy. With a rare episode of chest pain since.  No change in PEx.  Labs reviewed. Discussed procedure again with patient and family. Plan cath with possible PCI if indicated this morning.

## 2012-01-15 DIAGNOSIS — I10 Essential (primary) hypertension: Secondary | ICD-10-CM | POA: Diagnosis not present

## 2012-01-15 DIAGNOSIS — I2510 Atherosclerotic heart disease of native coronary artery without angina pectoris: Secondary | ICD-10-CM | POA: Diagnosis not present

## 2012-01-15 DIAGNOSIS — R9430 Abnormal result of cardiovascular function study, unspecified: Secondary | ICD-10-CM | POA: Diagnosis not present

## 2012-01-15 DIAGNOSIS — T82897A Other specified complication of cardiac prosthetic devices, implants and grafts, initial encounter: Secondary | ICD-10-CM | POA: Diagnosis not present

## 2012-01-15 DIAGNOSIS — R079 Chest pain, unspecified: Secondary | ICD-10-CM | POA: Diagnosis not present

## 2012-01-15 DIAGNOSIS — I25810 Atherosclerosis of coronary artery bypass graft(s) without angina pectoris: Secondary | ICD-10-CM | POA: Diagnosis not present

## 2012-01-15 DIAGNOSIS — E119 Type 2 diabetes mellitus without complications: Secondary | ICD-10-CM | POA: Diagnosis not present

## 2012-01-15 LAB — BASIC METABOLIC PANEL
BUN: 12 mg/dL (ref 6–23)
Calcium: 8.6 mg/dL (ref 8.4–10.5)
Creatinine, Ser: 0.97 mg/dL (ref 0.50–1.35)
GFR calc non Af Amer: 82 mL/min — ABNORMAL LOW (ref >90)
Glucose, Bld: 154 mg/dL — ABNORMAL HIGH (ref 70–99)
Sodium: 138 mEq/L (ref 135–145)

## 2012-01-15 LAB — CBC
HCT: 34 % — ABNORMAL LOW (ref 39.0–52.0)
Hemoglobin: 11.9 g/dL — ABNORMAL LOW (ref 13.0–17.0)
MCH: 30.6 pg (ref 26.0–34.0)
MCHC: 35 g/dL (ref 30.0–36.0)

## 2012-01-15 LAB — GLUCOSE, CAPILLARY
Glucose-Capillary: 195 mg/dL — ABNORMAL HIGH (ref 70–99)
Glucose-Capillary: 300 mg/dL — ABNORMAL HIGH (ref 70–99)
Glucose-Capillary: 284 mg/dL — ABNORMAL HIGH (ref 70–99)
Glucose-Capillary: 212 mg/dL — ABNORMAL HIGH (ref 70–99)

## 2012-01-15 MED ORDER — ASPIRIN 81 MG PO CHEW
CHEWABLE_TABLET | ORAL | Status: AC
  Filled 2012-01-15: qty 1

## 2012-01-15 MED ORDER — ALUM & MAG HYDROXIDE-SIMETH 200-200-20 MG/5ML PO SUSP
30.0000 mL | Freq: Four times a day (QID) | ORAL | Status: DC | PRN
Start: 2012-01-15 — End: 2012-01-16
  Administered 2012-01-15: 30 mL via ORAL
  Filled 2012-01-15: qty 30

## 2012-01-15 MED ORDER — KETOROLAC TROMETHAMINE 15 MG/ML IJ SOLN
15.0000 mg | Freq: Four times a day (QID) | INTRAMUSCULAR | Status: DC | PRN
  Administered 2012-01-15: 15 mg via INTRAVENOUS
  Filled 2012-01-15: qty 1

## 2012-01-15 MED FILL — Dextrose Inj 5%: INTRAVENOUS | Qty: 50 | Status: AC

## 2012-01-15 NOTE — Progress Notes (Signed)
R groin pain now a 2/10. Pressure dressing  intact. VSS 66 18 153/73

## 2012-01-15 NOTE — Progress Notes (Signed)
CARDIAC REHAB PHASE I   PRE:  Rate/Rhythm: 64 SR  BP:  Supine: 159/77  Sitting:   Standing:    SaO2:   MODE:  Ambulation: 340 ft   POST:  Rate/Rhythem: 74 SR  BP:  Supine:   Sitting: 156/56  Standing:    SaO2:  0750-0840 Assisted X 1 to ambulate. Gait steady VS stable. Pt states that groin site is tender with movement. Completed discharge education with pt and wife. Pt agrees to [place]. CRP in [place], will send referral.  Deon Pilling

## 2012-01-15 NOTE — Progress Notes (Signed)
Subjective:  Only issue last night was difficulty with groin, rebleed and groin hold. Mild groin pain this am.  Objective:  Temp:  [98 F (36.7 C)-98.7 F (37.1 C)] 98.1 F (36.7 C) (06/11 0730) Pulse Rate:  [51-63] 61  (06/11 0730) Resp:  [12-20] 18  (06/11 0700) BP: (129-178)/(69-86) 159/77 mmHg (06/11 0730) SpO2:  [92 %-97 %] 93 % (06/11 0730) Weight:  [102.5 kg (225 lb 15.5 oz)] 102.5 kg (225 lb 15.5 oz) (06/11 0350) Weight change:   Intake/Output from previous day: 06/10 0701 - 06/11 0700 In: 2558.8 [P.O.:325; I.V.:2233.8] Out: 1075 [Urine:1075]  Intake/Output from this shift:    Physical Exam: General appearance: alert and cooperative Neck: no adenopathy, no carotid bruit, no JVD, supple, symmetrical, trachea midline and thyroid not enlarged, symmetric, no tenderness/mass/nodules Lungs: clear to auscultation bilaterally Heart: regular rate and rhythm, S1, S2 normal, no murmur, click, rub or gallop Extremities: extremities normal, atraumatic, no cyanosis or edema and moderate ecchymosis right groin without bruit Pulses: 2+ and symmetric  Lab Results: Results for orders placed during the hospital encounter of 01/14/12 (from the past 48 hour(s))  POCT ACTIVATED CLOTTING TIME     Status: Normal   Collection Time   01/14/12 10:09 AM      Component Value Range Comment   Activated Clotting Time 562     GLUCOSE, CAPILLARY     Status: Abnormal   Collection Time   01/14/12 11:17 AM      Component Value Range Comment   Glucose-Capillary 189 (*) 70 - 99 (mg/dL)   CBC     Status: Abnormal   Collection Time   01/15/12  4:00 AM      Component Value Range Comment   WBC 8.4  4.0 - 10.5 (K/uL)    RBC 3.89 (*) 4.22 - 5.81 (MIL/uL)    Hemoglobin 11.9 (*) 13.0 - 17.0 (g/dL)    HCT 34.0 (*) 39.0 - 52.0 (%)    MCV 87.4  78.0 - 100.0 (fL)    MCH 30.6  26.0 - 34.0 (pg)    MCHC 35.0  30.0 - 36.0 (g/dL)    RDW 13.6  11.5 - 15.5 (%)    Platelets 161  150 - 400 (K/uL)   BASIC METABOLIC  PANEL     Status: Abnormal   Collection Time   01/15/12  4:00 AM      Component Value Range Comment   Sodium 138  135 - 145 (mEq/L)    Potassium 3.5  3.5 - 5.1 (mEq/L)    Chloride 101  96 - 112 (mEq/L)    CO2 28  19 - 32 (mEq/L)    Glucose, Bld 154 (*) 70 - 99 (mg/dL)    BUN 12  6 - 23 (mg/dL)    Creatinine, Ser 0.97  0.50 - 1.35 (mg/dL)    Calcium 8.6  8.4 - 10.5 (mg/dL)    GFR calc non Af Amer 82 (*) >90 (mL/min)    GFR calc Af Amer >90  >90 (mL/min)     Imaging: Imaging results have been reviewed  Assessment/Plan:   1. Active Problems: 2.  * No active hospital problems. *  3.   Time Spent Directly with Patient:  20 minutes  Length of Stay:  LOS: 1 day   S/P 2 Vessel PCI/stent. No CP. Labs OK. Exam benign except for ecchymosis right groin. No evidence of hematoma or pseudoaneurysm. Given difficulty with groin  Will keep one additional day. Home tomorrow AM.  Lorretta Harp 01/15/2012, 8:09 AM

## 2012-01-15 NOTE — Progress Notes (Signed)
Pt c/o increasing R groin burning and pain . Pressure dressing removed. Hematoma now a level 2. Pressure held for 25 minutes. Call placed to [facility] . Made aware of increase in size of hematoma and B/P 176/98 NTG presently at 92mcgs. New orders obtained.

## 2012-01-16 DIAGNOSIS — I10 Essential (primary) hypertension: Secondary | ICD-10-CM | POA: Diagnosis not present

## 2012-01-16 DIAGNOSIS — E119 Type 2 diabetes mellitus without complications: Secondary | ICD-10-CM | POA: Diagnosis not present

## 2012-01-16 DIAGNOSIS — I2510 Atherosclerotic heart disease of native coronary artery without angina pectoris: Secondary | ICD-10-CM | POA: Diagnosis not present

## 2012-01-16 DIAGNOSIS — I25810 Atherosclerosis of coronary artery bypass graft(s) without angina pectoris: Secondary | ICD-10-CM | POA: Diagnosis not present

## 2012-01-16 DIAGNOSIS — I493 Ventricular premature depolarization: Secondary | ICD-10-CM | POA: Diagnosis present

## 2012-01-16 DIAGNOSIS — R079 Chest pain, unspecified: Secondary | ICD-10-CM | POA: Diagnosis not present

## 2012-01-16 DIAGNOSIS — T82897A Other specified complication of cardiac prosthetic devices, implants and grafts, initial encounter: Secondary | ICD-10-CM | POA: Diagnosis not present

## 2012-01-16 DIAGNOSIS — R9430 Abnormal result of cardiovascular function study, unspecified: Secondary | ICD-10-CM | POA: Diagnosis not present

## 2012-01-16 DIAGNOSIS — N289 Disorder of kidney and ureter, unspecified: Secondary | ICD-10-CM | POA: Diagnosis present

## 2012-01-16 LAB — HEMOGLOBIN AND HEMATOCRIT, BLOOD: Hemoglobin: 11.5 g/dL — ABNORMAL LOW (ref 13.0–17.0)

## 2012-01-16 MED ORDER — NITROGLYCERIN 0.4 MG SL SUBL: 0.4000 mg | SUBLINGUAL_TABLET | SUBLINGUAL | Status: DC | PRN

## 2012-01-16 MED ORDER — GLYBURIDE-METFORMIN 5-500 MG PO TABS: 2.0000 | ORAL_TABLET | Freq: Two times a day (BID) | ORAL | Status: DC

## 2012-01-16 MED ORDER — ACETAMINOPHEN 325 MG PO TABS: 650.0000 mg | ORAL_TABLET | ORAL | Status: DC | PRN

## 2012-01-16 NOTE — Progress Notes (Signed)
[facility]  DAILY PROGRESS NOTE   Subjective:  No events overnight. Groin pain improved.  Objective:  Temp:  [97.5 F (36.4 C)-98 F (36.7 C)] 97.7 F (36.5 C) (06/12 0803) Pulse Rate:  [56-63] 63  (06/12 0803) Resp:  [17-23] 18  (06/12 0803) BP: (110-153)/(58-83) 145/67 mmHg (06/12 0803) SpO2:  [93 %-97 %] 95 % (06/12 0803) Weight:  [102.2 kg (225 lb 5 oz)] 102.2 kg (225 lb 5 oz) (06/12 0009) Weight change: 4.223 kg (9 lb 5 oz)  Intake/Output from previous day: 06/11 0701 - 06/12 0700 In: 480 [P.O.:480] Out: -   Intake/Output from this shift:    Medications: Current Facility-Administered Medications  Medication Dose Route Frequency Provider Last Rate Last Dose  . 0.9 %  sodium chloride infusion   Intravenous Continuous Troy Sine, MD 125 mL/hr at 01/14/12 1926    . acetaminophen (TYLENOL) tablet 650 mg  650 mg Oral Q4H PRN Troy Sine, MD   650 mg at 01/15/12 0357  . alum & mag hydroxide-simeth (MAALOX/MYLANTA) 200-200-20 MG/5ML suspension 30 mL  30 mL Oral Q6H PRN Troy Sine, MD   30 mL at 01/15/12 0503  . aspirin EC tablet 81 mg  81 mg Oral Daily Troy Sine, MD   81 mg at 01/16/12 0950  . atorvastatin (LIPITOR) tablet 40 mg  40 mg Oral QHS Troy Sine, MD      . clopidogrel (PLAVIX) tablet 75 mg  75 mg Oral Q breakfast Troy Sine, MD   75 mg at 01/16/12 0742  . felodipine (PLENDIL) 24 hr tablet 5 mg  5 mg Oral Daily Troy Sine, MD   5 mg at 01/16/12 0949  . insulin aspart (novoLOG) injection 0-15 Units  0-15 Units Subcutaneous TID WC Tarri Fuller, PA   5 Units at 01/16/12 0953  . isosorbide dinitrate (ISORDIL) tablet 30 mg  30 mg Oral Daily Troy Sine, MD   30 mg at 01/16/12 0950  . ketorolac (TORADOL) 15 MG/ML injection 15 mg  15 mg Intravenous Q6H PRN Tarri Fuller, PA   15 mg at 01/15/12 0612  . metoprolol succinate (TOPROL-XL) 24 hr tablet 100 mg  100 mg Oral Q breakfast Troy Sine, MD   100 mg at 01/16/12  0741  . metoprolol succinate (TOPROL-XL) 24 hr tablet 50 mg  50 mg Oral Q supper Troy Sine, MD   50 mg at 01/15/12 1842  . morphine 4 MG/ML injection 2 mg  2 mg Intravenous Q3H PRN Troy Sine, MD      . nitroGLYCERIN 0.2 mg/mL in dextrose 5 % infusion  20 mcg/min Intravenous Titrated Tarri Fuller, PA 3 mL/hr at 01/14/12 2300 10 mcg/min at 01/14/12 2300  . ondansetron (ZOFRAN) injection 4 mg  4 mg Intravenous Q6H PRN Troy Sine, MD      . ranolazine (RANEXA) 12 hr tablet 500 mg  500 mg Oral BID Troy Sine, MD   500 mg at 01/16/12 0953  . vitamin C (ASCORBIC ACID) tablet 500 mg  500 mg Oral QHS,MR X 1 Troy Sine, MD   500 mg at 01/15/12 2138    Physical Exam: General appearance: alert and no distress Neck: no adenopathy, no carotid bruit, no JVD, supple, symmetrical, trachea midline and thyroid not enlarged, symmetric, no tenderness/mass/nodules Lungs: clear to auscultation bilaterally Heart: regular rate and rhythm, S1, S2 normal, no murmur, click, rub or gallop Abdomen: soft,  non-tender; bowel sounds normal; no masses,  no organomegaly Extremities: right groin with moderate sized hematoma and ecchymosis, no bruit  Lab Results: Results for orders placed during the hospital encounter of 01/14/12 (from the past 48 hour(s))  GLUCOSE, CAPILLARY     Status: Abnormal   Collection Time   01/14/12 11:17 AM      Component Value Range Comment   Glucose-Capillary 189 (*) 70 - 99 mg/dL   GLUCOSE, CAPILLARY     Status: Abnormal   Collection Time   01/14/12  5:28 PM      Component Value Range Comment   Glucose-Capillary 297 (*) 70 - 99 mg/dL   GLUCOSE, CAPILLARY     Status: Abnormal   Collection Time   01/14/12  9:50 PM      Component Value Range Comment   Glucose-Capillary 202 (*) 70 - 99 mg/dL    Comment 1 Documented in Chart      Comment 2 Notify RN     CBC     Status: Abnormal   Collection Time   01/15/12  4:00 AM      Component Value Range Comment   WBC 8.4  4.0 - 10.5  K/uL    RBC 3.89 (*) 4.22 - 5.81 MIL/uL    Hemoglobin 11.9 (*) 13.0 - 17.0 g/dL    HCT 34.0 (*) 39.0 - 52.0 %    MCV 87.4  78.0 - 100.0 fL    MCH 30.6  26.0 - 34.0 pg    MCHC 35.0  30.0 - 36.0 g/dL    RDW 13.6  11.5 - 15.5 %    Platelets 161  150 - 400 K/uL   BASIC METABOLIC PANEL     Status: Abnormal   Collection Time   01/15/12  4:00 AM      Component Value Range Comment   Sodium 138  135 - 145 mEq/L    Potassium 3.5  3.5 - 5.1 mEq/L    Chloride 101  96 - 112 mEq/L    CO2 28  19 - 32 mEq/L    Glucose, Bld 154 (*) 70 - 99 mg/dL    BUN 12  6 - 23 mg/dL    Creatinine, Ser 0.97  0.50 - 1.35 mg/dL    Calcium 8.6  8.4 - 10.5 mg/dL    GFR calc non Af Amer 82 (*) >90 mL/min    GFR calc Af Amer >90  >90 mL/min   GLUCOSE, CAPILLARY     Status: Abnormal   Collection Time   01/15/12  7:55 AM      Component Value Range Comment   Glucose-Capillary 195 (*) 70 - 99 mg/dL   GLUCOSE, CAPILLARY     Status: Abnormal   Collection Time   01/15/12 12:12 PM      Component Value Range Comment   Glucose-Capillary 300 (*) 70 - 99 mg/dL   GLUCOSE, CAPILLARY     Status: Abnormal   Collection Time   01/15/12  5:55 PM      Component Value Range Comment   Glucose-Capillary 284 (*) 70 - 99 mg/dL   GLUCOSE, CAPILLARY     Status: Abnormal   Collection Time   01/15/12  9:36 PM      Component Value Range Comment   Glucose-Capillary 212 (*) 70 - 99 mg/dL    Comment 1 Notify RN      Comment 2 Documented in Chart     GLUCOSE, CAPILLARY     Status:  Abnormal   Collection Time   01/16/12  7:44 AM      Component Value Range Comment   Glucose-Capillary 234 (*) 70 - 99 mg/dL     Imaging: No results found.  Assessment:  1. Active Problems: 2.  * No active hospital problems. *  3.   CAD s/p PCI PCI: LAD PTCA/stent WITH NEW 2.75X22 resolute des IN MID LAD post dilated to 3.08 to 2.97 taper: 99%-80% to 0 LCX: PTCA via LM stent with 2.25x12 Sprinter balloon: 90% - 5  4.   Right groin  hematoma  Plan:  1. Check STAT H/H.  If stable, likely okay for discharge today. Follow-up with Dr. Claiborne Billings as an outpatient.  Time Spent Directly with Patient:  15 minutes  Length of Stay:  LOS: 2 days   Pixie Casino, MD, [facility] Attending Cardiologist [facility] 01/16/2012, 10:09 AM

## 2012-01-16 NOTE — Discharge Instructions (Signed)
Angiogram, Angioplasty, or Stent Placement Care After One of the following procedures was done today. ANGIOGRAM: A catheter was placed through the blood vessel in your groin, contrast was injected into the vessels, and pictures were taken. ANGIOPLASTY: A catheter was placed through the blood vessel in your groin and directed to an area of blocked blood flow. A balloon, and possibly a metal stent were used to open the blockage. If no other blockages are present below this area, your symptoms should improve. If blockages are present below this area, surgery may still be necessary. STENT: A catheter was placed in your groin through which a metal mesh tube was placed in a narrowed part of the artery to facilitate blood flow. You were given intravenous sedation. These medications are rapidly cleared from your bloodstream. You may feel some discomfort at the insertion site after the local anesthetic wears off. This discomfort should gradually improve over the next several days.  Only take over-the-counter or prescription medicines for pain, discomfort, or fever as directed by your caregiver.   Complications are very uncommon after this procedure. Go to the nearest emergency department if you develop any of the following symptoms:   Worsening pain.   Bleeding.   Swelling at the puncture site.   Lightheadedness.   Dizziness or fainting.   Fever or chills.   If oozing, bleeding, or a lump appears at the puncture site, apply firm pressure directly to the site steadily for 15 minutes and go to the emergency department.   Keep the skin around the insertion site dry. You may take showers after 24 hours. If the area does get wet, dry the skin completely. Avoid baths until the skin puncture site heals, usually 5 to 7 days.   Development of redness, increased soreness, or swelling may be signs of a skin infection. Contact your physician.   Rest for the remainder of the day and avoid any heavy  lifting (more than 10 pounds or 4.5 kg). Do not operate heavy machinery, drive, or make legal decisions for the first 24 hours after the procedure. Have a responsible person drive you home.   You may resume your usual diet after the procedure. Avoid alcoholic beverages for 24 hours after the procedure.  Document Released: 07/23/2005 Document Revised: 07/12/2011 Document Reviewed: 05/22/2006 [facility] Patient Information 2012 [place].

## 2012-01-16 NOTE — Progress Notes (Signed)
CARDIAC REHAB PHASE I   PRE:  Rate/Rhythm: 66 SR  BP:  Supine:   Sitting: 120/50  Standing:    SaO2:   MODE:  Ambulation: 680 ft   POST:  Rate/Rhythem: 72 SR  BP:  Supine:   Sitting: 136/68  Standing:    SaO2:  0915-0940 Tolerated ambulation well without c/o of cp or SOB. Pt does c/o of groin site being tender, but he states that it is no worse than yesterday.Pt back to side of bed after walk with call light in reach.  Deon Pilling

## 2012-01-16 NOTE — Progress Notes (Signed)
Patient ID: Roberto Haley,  MRN: [id number], DOB/AGE: 11-Sep-1940 71 y.o.  Admit date: 01/14/2012 Discharge date: 01/16/2012  Primary Care Provider: Dr Lucilla Lame Primary Cardiologist: Dr Corky Downs  Discharge Diagnoses Principal Problem:  *Abnormal nuclear cardiac imaging test Active Problems:  CAD, CABG '97, PCI '98, "04, with LAD DES and CFX PTCA 01/15/12 after abn Nuc.  Renal insufficiency, Scr 1.86 01/09/12 and 0.97 on 01/15/12 ?  HTN (hypertension)  Diabetes mellitus  PVC (premature ventricular contraction)    Procedures: Cath/ LAD DES and CFX PTCA 01/14/12   Hospital Course:  71 y/o male with a history of CAD, CABG in '97, PCI '98 and 4/04. He recently saw Dr Claiborne Billings as an OP with complaints of chest pain. A Myoview was done that was abnormal. He is admitted now for cath. This was done 01/14/12 and revealed LAD and CFX disease. He underwent elective LAD DES and CFX PTCA. Dr Debara Pickett feels he can be discharge 01/16/12. Interestingly, his SCr was 1.86 on 01/09/12 but repeat pre cath 01/15/12 was 0.97. For now we will hold his ACE-I and continue HCTZ. His Metformin was held for 48hrs post cath. He will need follow up BMP in week or so as an OP.  Discharge Vitals:  Blood pressure 145/67, pulse 63, temperature 97.7 F (36.5 C), temperature source Oral, resp. rate 18, height 5\' 8"  (1.727 m), weight 102.2 kg (225 lb 5 oz), SpO2 95.00%.    Labs: Results for orders placed during the hospital encounter of 01/14/12 (from the past 48 hour(s))  GLUCOSE, CAPILLARY     Status: Abnormal   Collection Time   01/14/12  5:28 PM      Component Value Range Comment   Glucose-Capillary 297 (*) 70 - 99 mg/dL   GLUCOSE, CAPILLARY     Status: Abnormal   Collection Time   01/14/12  9:50 PM      Component Value Range Comment   Glucose-Capillary 202 (*) 70 - 99 mg/dL    Comment 1 Documented in Chart      Comment 2 Notify RN     CBC     Status: Abnormal   Collection Time   01/15/12  4:00 AM      Component Value  Range Comment   WBC 8.4  4.0 - 10.5 K/uL    RBC 3.89 (*) 4.22 - 5.81 MIL/uL    Hemoglobin 11.9 (*) 13.0 - 17.0 g/dL    HCT 34.0 (*) 39.0 - 52.0 %    MCV 87.4  78.0 - 100.0 fL    MCH 30.6  26.0 - 34.0 pg    MCHC 35.0  30.0 - 36.0 g/dL    RDW 13.6  11.5 - 15.5 %    Platelets 161  150 - 400 K/uL   BASIC METABOLIC PANEL     Status: Abnormal   Collection Time   01/15/12  4:00 AM      Component Value Range Comment   Sodium 138  135 - 145 mEq/L    Potassium 3.5  3.5 - 5.1 mEq/L    Chloride 101  96 - 112 mEq/L    CO2 28  19 - 32 mEq/L    Glucose, Bld 154 (*) 70 - 99 mg/dL    BUN 12  6 - 23 mg/dL    Creatinine, Ser 0.97  0.50 - 1.35 mg/dL    Calcium 8.6  8.4 - 10.5 mg/dL    GFR calc non Af Amer 82 (*) >90 mL/min  GFR calc Af Amer >90  >90 mL/min   GLUCOSE, CAPILLARY     Status: Abnormal   Collection Time   01/15/12  7:55 AM      Component Value Range Comment   Glucose-Capillary 195 (*) 70 - 99 mg/dL   GLUCOSE, CAPILLARY     Status: Abnormal   Collection Time   01/15/12 12:12 PM      Component Value Range Comment   Glucose-Capillary 300 (*) 70 - 99 mg/dL   GLUCOSE, CAPILLARY     Status: Abnormal   Collection Time   01/15/12  5:55 PM      Component Value Range Comment   Glucose-Capillary 284 (*) 70 - 99 mg/dL   GLUCOSE, CAPILLARY     Status: Abnormal   Collection Time   01/15/12  9:36 PM      Component Value Range Comment   Glucose-Capillary 212 (*) 70 - 99 mg/dL    Comment 1 Notify RN      Comment 2 Documented in Chart     GLUCOSE, CAPILLARY     Status: Abnormal   Collection Time   01/16/12  7:44 AM      Component Value Range Comment   Glucose-Capillary 234 (*) 70 - 99 mg/dL   HEMOGLOBIN AND HEMATOCRIT, BLOOD     Status: Abnormal   Collection Time   01/16/12 10:45 AM      Component Value Range Comment   Hemoglobin 11.5 (*) 13.0 - 17.0 g/dL    HCT 33.5 (*) 39.0 - 52.0 %   GLUCOSE, CAPILLARY     Status: Abnormal   Collection Time   01/16/12 12:04 PM      Component Value  Range Comment   Glucose-Capillary 271 (*) 70 - 99 mg/dL     Disposition:  Follow-up Information    Follow up with Shelva Majestic A, MD. (office will call)    Contact information:   [facility] [place] [phone]          Discharge Medications:  Medication List  As of 01/16/2012 12:33 PM   STOP taking these medications         CINNAMON PO      multivitamin with minerals tablet      rosuvastatin 10 MG tablet         TAKE these medications         acetaminophen 325 MG tablet   Commonly known as: TYLENOL   Take 2 tablets (650 mg total) by mouth every 4 (four) hours as needed.      aspirin 81 MG tablet   Take 81 mg by mouth daily.      benazepril 40 MG tablet   Commonly known as: LOTENSIN   Take 40 mg by mouth daily.      clopidogrel 75 MG tablet   Commonly known as: PLAVIX   Take 75 mg by mouth daily.      felodipine 5 MG 24 hr tablet   Commonly known as: PLENDIL   Take 5 mg by mouth daily.      glyBURIDE-metformin 5-500 MG per tablet   Commonly known as: GLUCOVANCE   Take 2 tablets by mouth 2 (two) times daily with a meal.      hydrochlorothiazide 25 MG tablet   Commonly known as: HYDRODIURIL   Take 25 mg by mouth daily.      isosorbide dinitrate 30 MG tablet   Commonly known as: ISORDIL   Take 30  mg by mouth daily.      metoprolol succinate 100 MG 24 hr tablet   Commonly known as: TOPROL-XL   Take 50-100 mg by mouth 2 (two) times daily. Takes 100 mg in am and 50 mg at night. Take with or immediately following a meal.      nitroGLYCERIN 0.4 MG SL tablet   Commonly known as: NITROSTAT   Place 1 tablet (0.4 mg total) under the tongue every 5 (five) minutes as needed for chest pain.      omeprazole 40 MG capsule   Commonly known as: PRILOSEC   Take 40 mg by mouth daily.      ONGLYZA 5 MG Tabs tablet   Generic drug: saxagliptin HCl   Take by mouth daily.      RANEXA 500 MG 12 hr tablet   Generic drug:  ranolazine   Take 500 mg by mouth 2 (two) times daily.      sitaGLIPtin 100 MG tablet   Commonly known as: JANUVIA   Take 100 mg by mouth daily.      vitamin C 500 MG tablet   Commonly known as: ASCORBIC ACID   Take 500 mg by mouth at bedtime and may repeat dose one time if needed.             Duration of Discharge Encounter: Greater than 30 minutes including physician time.  Angelena Form PA-C 01/16/2012 12:33 PM

## 2012-01-18 ENCOUNTER — Observation Stay (HOSPITAL_COMMUNITY)
Admission: AD | Admit: 2012-01-18 | Discharge: 2012-01-19 | Disposition: A | Discharge status: Home / Self Care | Payer: Medicare Other | Source: Ambulatory Visit | Attending: Internal Medicine | Admitting: Internal Medicine

## 2012-01-18 DIAGNOSIS — I2510 Atherosclerotic heart disease of native coronary artery without angina pectoris: Secondary | ICD-10-CM | POA: Diagnosis not present

## 2012-01-18 DIAGNOSIS — I999 Unspecified disorder of circulatory system: Principal | ICD-10-CM | POA: Insufficient documentation

## 2012-01-18 DIAGNOSIS — N289 Disorder of kidney and ureter, unspecified: Secondary | ICD-10-CM | POA: Diagnosis not present

## 2012-01-18 DIAGNOSIS — R1900 Intra-abdominal and pelvic swelling, mass and lump, unspecified site: Secondary | ICD-10-CM | POA: Diagnosis not present

## 2012-01-18 DIAGNOSIS — I493 Ventricular premature depolarization: Secondary | ICD-10-CM | POA: Diagnosis present

## 2012-01-18 DIAGNOSIS — I729 Aneurysm of unspecified site: Secondary | ICD-10-CM | POA: Diagnosis present

## 2012-01-18 DIAGNOSIS — E119 Type 2 diabetes mellitus without complications: Secondary | ICD-10-CM | POA: Diagnosis present

## 2012-01-18 DIAGNOSIS — I10 Essential (primary) hypertension: Secondary | ICD-10-CM | POA: Diagnosis not present

## 2012-01-18 DIAGNOSIS — I4949 Other premature depolarization: Secondary | ICD-10-CM | POA: Insufficient documentation

## 2012-01-18 DIAGNOSIS — Z48812 Encounter for surgical aftercare following surgery on the circulatory system: Secondary | ICD-10-CM

## 2012-01-18 DIAGNOSIS — M79609 Pain in unspecified limb: Secondary | ICD-10-CM | POA: Diagnosis not present

## 2012-01-18 DIAGNOSIS — Y849 Medical procedure, unspecified as the cause of abnormal reaction of the patient, or of later complication, without mention of misadventure at the time of the procedure: Secondary | ICD-10-CM | POA: Insufficient documentation

## 2012-01-18 LAB — BASIC METABOLIC PANEL
BUN: 23 mg/dL (ref 6–23)
CO2: 28 mEq/L (ref 19–32)
Calcium: 9.1 mg/dL (ref 8.4–10.5)
Chloride: 100 mEq/L (ref 96–112)
Creatinine, Ser: 1.22 mg/dL (ref 0.50–1.35)
GFR calc Af Amer: 68 mL/min — ABNORMAL LOW (ref >90)
GFR calc non Af Amer: 59 mL/min — ABNORMAL LOW (ref >90)
Glucose, Bld: 223 mg/dL — ABNORMAL HIGH (ref 70–99)
Potassium: 3.7 mEq/L (ref 3.5–5.1)
Sodium: 138 mEq/L (ref 135–145)

## 2012-01-18 LAB — CBC
HCT: 30.3 % — ABNORMAL LOW (ref 39.0–52.0)
Hemoglobin: 10.2 g/dL — ABNORMAL LOW (ref 13.0–17.0)
MCH: 29.8 pg (ref 26.0–34.0)
MCHC: 33.7 g/dL (ref 30.0–36.0)
MCV: 88.6 fL (ref 78.0–100.0)
Platelets: 202 10*3/uL (ref 150–400)
RBC: 3.42 MIL/uL — ABNORMAL LOW (ref 4.22–5.81)
RDW: 13.9 % (ref 11.5–15.5)
WBC: 8.9 10*3/uL (ref 4.0–10.5)

## 2012-01-18 LAB — GLUCOSE, CAPILLARY
Glucose-Capillary: 340 mg/dL — ABNORMAL HIGH (ref 70–99)
Glucose-Capillary: 182 mg/dL — ABNORMAL HIGH (ref 70–99)
Glucose-Capillary: 194 mg/dL — ABNORMAL HIGH (ref 70–99)

## 2012-01-18 LAB — PROTIME-INR
INR: 1.19 (ref 0.00–1.49)
Prothrombin Time: 15.4 seconds — ABNORMAL HIGH (ref 11.6–15.2)

## 2012-01-18 MED ORDER — ZOLPIDEM TARTRATE 5 MG PO TABS: 5.0000 mg | ORAL_TABLET | Freq: Every evening | ORAL | Status: DC | PRN

## 2012-01-18 MED ORDER — PANTOPRAZOLE SODIUM 40 MG PO TBEC: 40.0000 mg | DELAYED_RELEASE_TABLET | Freq: Every day | ORAL | Status: DC

## 2012-01-18 MED ORDER — SODIUM CHLORIDE 0.9 % IJ SOLN
3.0000 mL | Freq: Two times a day (BID) | INTRAMUSCULAR | Status: DC
  Administered 2012-01-18: 3 mL via INTRAVENOUS

## 2012-01-18 MED ORDER — ISOSORBIDE MONONITRATE ER 30 MG PO TB24
30.0000 mg | ORAL_TABLET | Freq: Every day | ORAL | Status: DC
  Filled 2012-01-18 (×2): qty 1

## 2012-01-18 MED ORDER — SITAGLIPTIN PHOSPHATE 100 MG PO TABS
100.0000 mg | ORAL_TABLET | Freq: Every day | ORAL | Status: DC
  Administered 2012-01-18: 100 mg via ORAL
  Filled 2012-01-18 (×3): qty 1

## 2012-01-18 MED ORDER — METOPROLOL SUCCINATE ER 50 MG PO TB24: 50.0000 mg | ORAL_TABLET | Freq: Two times a day (BID) | ORAL | Status: DC

## 2012-01-18 MED ORDER — SODIUM CHLORIDE 0.9 % IJ SOLN: 3.0000 mL | INTRAMUSCULAR | Status: DC | PRN

## 2012-01-18 MED ORDER — ACETAMINOPHEN 325 MG PO TABS: 650.0000 mg | ORAL_TABLET | ORAL | Status: DC | PRN

## 2012-01-18 MED ORDER — HYDROMORPHONE HCL PF 1 MG/ML IJ SOLN
1.0000 mg | Freq: Once | INTRAMUSCULAR | Status: AC
  Administered 2012-01-18: 0.5 mg via INTRAVENOUS
  Filled 2012-01-18 (×2): qty 1

## 2012-01-18 MED ORDER — CLOPIDOGREL BISULFATE 75 MG PO TABS
75.0000 mg | ORAL_TABLET | Freq: Every day | ORAL | Status: DC
  Administered 2012-01-19: 75 mg via ORAL
  Filled 2012-01-18: qty 1

## 2012-01-18 MED ORDER — SODIUM CHLORIDE 0.9 % IV SOLN: 250.0000 mL | INTRAVENOUS | Status: DC | PRN

## 2012-01-18 MED ORDER — LIDOCAINE HCL 1 % IJ SOLN
5.0000 mL | Freq: Once | INTRAMUSCULAR | Status: DC
  Filled 2012-01-18: qty 5

## 2012-01-18 MED ORDER — HYDROCHLOROTHIAZIDE 25 MG PO TABS
25.0000 mg | ORAL_TABLET | Freq: Every day | ORAL | Status: DC
  Filled 2012-01-18 (×2): qty 1

## 2012-01-18 MED ORDER — METFORMIN HCL 500 MG PO TABS
1000.0000 mg | ORAL_TABLET | Freq: Two times a day (BID) | ORAL | Status: DC
  Administered 2012-01-18 – 2012-01-19 (×2): 1000 mg via ORAL
  Filled 2012-01-18 (×4): qty 2

## 2012-01-18 MED ORDER — FELODIPINE ER 5 MG PO TB24
5.0000 mg | ORAL_TABLET | Freq: Every day | ORAL | Status: DC
  Filled 2012-01-18 (×2): qty 1

## 2012-01-18 MED ORDER — RANOLAZINE ER 500 MG PO TB12
500.0000 mg | ORAL_TABLET | Freq: Two times a day (BID) | ORAL | Status: DC
  Administered 2012-01-18 – 2012-01-19 (×2): 500 mg via ORAL
  Filled 2012-01-18 (×3): qty 1

## 2012-01-18 MED ORDER — ISOSORBIDE DINITRATE 30 MG PO TABS: 30.0000 mg | ORAL_TABLET | Freq: Every day | ORAL | Status: DC

## 2012-01-18 MED ORDER — VITAMIN C 500 MG PO TABS
500.0000 mg | ORAL_TABLET | Freq: Every evening | ORAL | Status: DC | PRN
  Administered 2012-01-18: 500 mg via ORAL
  Filled 2012-01-18 (×4): qty 1

## 2012-01-18 MED ORDER — METOPROLOL SUCCINATE ER 50 MG PO TB24
50.0000 mg | ORAL_TABLET | Freq: Every day | ORAL | Status: DC
  Filled 2012-01-18 (×3): qty 1

## 2012-01-18 MED ORDER — ASPIRIN 81 MG PO CHEW
81.0000 mg | CHEWABLE_TABLET | Freq: Every day | ORAL | Status: DC
  Administered 2012-01-19: 81 mg via ORAL
  Filled 2012-01-18: qty 1

## 2012-01-18 MED ORDER — NITROGLYCERIN 0.4 MG SL SUBL: 0.4000 mg | SUBLINGUAL_TABLET | SUBLINGUAL | Status: DC | PRN

## 2012-01-18 MED ORDER — LIDOCAINE HCL (PF) 1 % IJ SOLN
5.0000 mL | Freq: Once | INTRAMUSCULAR | Status: AC
  Administered 2012-01-18: 5 mL via INTRADERMAL
  Filled 2012-01-18: qty 5

## 2012-01-18 MED ORDER — INSULIN ASPART 100 UNIT/ML ~~LOC~~ SOLN: 0.0000 [IU] | Freq: Every day | SUBCUTANEOUS | Status: DC

## 2012-01-18 MED ORDER — INSULIN ASPART 100 UNIT/ML ~~LOC~~ SOLN
0.0000 [IU] | Freq: Three times a day (TID) | SUBCUTANEOUS | Status: DC
  Administered 2012-01-18 – 2012-01-19 (×2): 2 [IU] via SUBCUTANEOUS
  Administered 2012-01-19: 3 [IU] via SUBCUTANEOUS

## 2012-01-18 MED ORDER — ATORVASTATIN CALCIUM 10 MG PO TABS
10.0000 mg | ORAL_TABLET | Freq: Every day | ORAL | Status: DC
  Administered 2012-01-18: 10 mg via ORAL
  Filled 2012-01-18 (×2): qty 1

## 2012-01-18 MED ORDER — GLYBURIDE-METFORMIN 5-500 MG PO TABS: 2.0000 | ORAL_TABLET | Freq: Two times a day (BID) | ORAL | Status: DC

## 2012-01-18 MED ORDER — ASPIRIN 81 MG PO TABS: 81.0000 mg | ORAL_TABLET | Freq: Every day | ORAL | Status: DC

## 2012-01-18 MED ORDER — THROMBIN 5000 UNITS EX KIT
1000.0000 [IU] | PACK | Freq: Once | CUTANEOUS | Status: AC
  Administered 2012-01-18: 1000 [IU] via TOPICAL
  Filled 2012-01-18: qty 1

## 2012-01-18 MED ORDER — METOPROLOL SUCCINATE ER 100 MG PO TB24
100.0000 mg | ORAL_TABLET | Freq: Every day | ORAL | Status: DC
  Filled 2012-01-18 (×3): qty 1

## 2012-01-18 MED ORDER — MIDAZOLAM HCL 2 MG/2ML IJ SOLN
2.0000 mg | Freq: Once | INTRAMUSCULAR | Status: DC
  Filled 2012-01-18: qty 2

## 2012-01-18 MED ORDER — GLYBURIDE 5 MG PO TABS
10.0000 mg | ORAL_TABLET | Freq: Two times a day (BID) | ORAL | Status: DC
  Administered 2012-01-18 – 2012-01-19 (×2): 10 mg via ORAL
  Filled 2012-01-18 (×4): qty 2

## 2012-01-18 NOTE — CV Procedure (Signed)
US Guided Thrombin Injection  Roberto Haley is a 71 year old Caucasian male patient of Dr. Ellouise Newer is who recently had a cardiac intervention performed earlier this week right femoral approach. He developed groin pain yesterday and presented to the office today ultrasound visualized a right common femoral pseudoaneurysm. He presents today for ultrasound-guided thrombin injection.  Procedure description: Access was obtained. The patient received 0.5 mg of IV Dilaudid for sedation. The pseudoaneurysm was visualized and ultrasound. The groin was then and in the usual sterile fashion style drape and Betadine. The area was anesthetized with Xylocaine. Using the includes lumbar puncture needle the pseudoaneurysm was accessed under ultrasound visualization . Agitated saline location of the needle tip was documented within the pseudoaneurysm and the neck. Following this thrombin was then injected slowly and the swelling right in the pseudoaneurysm became thrombosed. A triphasic pulses demonstrate an ultrasound of the case. The patient tolerated the procedure procedure well.  Impression: Successful ultrasound-guided thrombin injection of the right common femoral pseudoaneurysm as a result of a recent cardiac catheterization and PCI earlier this week. I shall, hours. Observed overnight and discharged home in the morning.  Lorretta Harp, M.D., [place] [place], [place]  [id number]  [phone] 01/18/2012 2:26 PM

## 2012-01-18 NOTE — H&P (Signed)
Patient ID: Roberto Haley MRN: [id number], DOB/AGE: [birth year]   Admit date: 01/18/2012   Primary Physician: Nicoletta Dress, MD Primary Cardiologist: Dr Claiborne Billings  HPI: 71 y/o male with a history of CAD, CABG in '97, PCI '98 and 4/04. He recently saw Dr Claiborne Billings as an OP with complaints of chest pain. A Myoview was done that was abnormal. He is admitted 01/14/12  for cath. This was done and revealed LAD and CFX disease. He underwent elective LAD DES and CFX PTCA. Dr Debara Pickett felt he could be discharge 01/16/12. Interestingly, his SCr was 1.86 on 01/09/12 but repeat pre cath 01/15/12 was 0.97.  We will held his ACE-I and continued HCTZ. His Metformin was held for 48hrs post cath. He will need follow up BMP in week or so as an OP. On 01/17/12 afternoon he called complaining of increasing pain and swelling in his Rt groin. He says he did have some ecchymosis at the site at discharge but no swelling. We asked him to come to the office 01/18/12. US revealed 3.1 X 3.4 X 3.1 pseudoaneurysm with surrounding hematoma. He is admitted now for thrombin injection.    Problem List: Past Medical History  Diagnosis Date  . Coronary artery disease   . Diabetes mellitus     Past Surgical History  Procedure Date  . Coronary artery bypass graft 2009     Allergies:  Allergies  Allergen Reactions  . Codeine Rash     Home Medications Prescriptions prior to admission  Medication Sig Dispense Refill  . acetaminophen (TYLENOL) 325 MG tablet Take 650 mg by mouth every 4 (four) hours as needed. For pain      . aspirin 81 MG tablet Take 81 mg by mouth daily.      . benazepril (LOTENSIN) 40 MG tablet Take 40 mg by mouth daily.      . clopidogrel (PLAVIX) 75 MG tablet Take 75 mg by mouth daily.      . felodipine (PLENDIL) 5 MG 24 hr tablet Take 5 mg by mouth daily.      Marland Kitchen glyBURIDE-metformin (GLUCOVANCE) 5-500 MG per tablet Take 2 tablets by mouth 2 (two) times daily with a meal.      . hydrochlorothiazide (HYDRODIURIL) 25  MG tablet Take 25 mg by mouth daily.      . isosorbide dinitrate (ISORDIL) 30 MG tablet Take 30 mg by mouth daily.      . metoprolol succinate (TOPROL-XL) 100 MG 24 hr tablet Take 50-100 mg by mouth 2 (two) times daily. Takes 100 mg in am and 50 mg at night. Take with or immediately following a meal.      . nitroGLYCERIN (NITROSTAT) 0.4 MG SL tablet Place 0.4 mg under the tongue every 5 (five) minutes as needed. For chest pain      . omeprazole (PRILOSEC) 40 MG capsule Take 40 mg by mouth daily.      . ranolazine (RANEXA) 500 MG 12 hr tablet Take 500 mg by mouth 2 (two) times daily.      . rosuvastatin (CRESTOR) 10 MG tablet Take 10 mg by mouth daily.      . saxagliptin HCl (ONGLYZA) 5 MG TABS tablet Take 5 mg by mouth daily.       . sitaGLIPtin (JANUVIA) 100 MG tablet Take 100 mg by mouth daily.      . vitamin C (ASCORBIC ACID) 500 MG tablet Take 500 mg by mouth at bedtime and may repeat dose one time if needed.      Marland Kitchen  DISCONTD: acetaminophen (TYLENOL) 325 MG tablet Take 2 tablets (650 mg total) by mouth every 4 (four) hours as needed.      Marland Kitchen DISCONTD: glyBURIDE-metformin (GLUCOVANCE) 5-500 MG per tablet Take 2 tablets by mouth 2 (two) times daily with a meal.      . DISCONTD: nitroGLYCERIN (NITROSTAT) 0.4 MG SL tablet Place 1 tablet (0.4 mg total) under the tongue every 5 (five) minutes as needed for chest pain.  25 tablet  2     No family history on file.   History   Social History  . Marital Status: Married    Spouse Name: N/A    Number of Children: N/A  . Years of Education: N/A   Occupational History  . Not on file.   Social History Main Topics  . Smoking status: Never Smoker   . Smokeless tobacco: Not on file  . Alcohol Use: No  . Drug Use: No  . Sexually Active:    Other Topics Concern  . Not on file   Social History Narrative  . No narrative on file     Review of Systems: General: negative for chills, fever, night sweats or weight changes.  Cardiovascular:  negative for chest pain, dyspnea on exertion, edema, orthopnea, palpitations, paroxysmal nocturnal dyspnea or shortness of breath Dermatological: negative for rash Respiratory: negative for cough or wheezing Urologic: negative for hematuria Abdominal: negative for nausea, vomiting, diarrhea, bright red blood per rectum, melena, or hematemesis Neurologic: negative for visual changes, syncope, or dizziness All other systems reviewed and are otherwise negative except as noted above.  Physical Exam: There were no vitals taken for this visit.  General appearance: alert, cooperative, no distress and moderately obese Neck: no carotid bruit, no JVD, supple, symmetrical, trachea midline and thyroid not enlarged, symmetric, no tenderness/mass/nodules Lungs: clear to auscultation bilaterally Heart: regular rate and rhythm Abdomen: obese Extremities: Rt femoral artery site echymotic with firm swelling Pulses: 2+ and symmetric Skin: Skin color, texture, turgor normal. 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  Renal insufficiency, Scr 1.86 01/09/12 and 0.97 on 01/15/12 ?  HTN (hypertension)  Diabetes mellitus  PVC (premature ventricular contraction)  Plan: Dr Gwenlyn Found to see for Thrombin injection today. Check BMP and CBC, (see note re: renal insufficiency). Continue to hold ACE-I for now.  Upon reviewing pt's medications, he lists both Januvia (300.00/month) and Onglyza, (900.00/month) as home meds.  This was likely secondary  to automatic pharmacy substitution that we are unable to override without pharmacy permission.  I have spoken to pharmacy and asked that Januvia be ordered to avoid confusion.   I have asked the patient to bring in all meds from home to review before discharge.  Henri Medal, PA-C 01/18/2012, 1:10 PM  Agree with note written by Kerin Ransom [facility]  Pt with recent cath PCI earlier this week with development of right groin pain yesterday and documentation of  a large pseudoaneurysm RCFA today in our office with anatomy suitable for US guided thrombin injection. The pt presents for that today. Otherwise he is stable w/o complaints. Exam was benign except for a tense right groin w/o bruit and a 2+ R pp.  Lorretta Harp 01/18/2012 2:15 PM

## 2012-01-18 NOTE — Progress Notes (Signed)
Utilization review completed.  

## 2012-01-19 DIAGNOSIS — R079 Chest pain, unspecified: Secondary | ICD-10-CM | POA: Diagnosis not present

## 2012-01-19 DIAGNOSIS — I2510 Atherosclerotic heart disease of native coronary artery without angina pectoris: Secondary | ICD-10-CM | POA: Diagnosis not present

## 2012-01-19 DIAGNOSIS — R9430 Abnormal result of cardiovascular function study, unspecified: Secondary | ICD-10-CM | POA: Diagnosis not present

## 2012-01-19 LAB — CBC
HCT: 30.1 % — ABNORMAL LOW (ref 39.0–52.0)
Hemoglobin: 10.1 g/dL — ABNORMAL LOW (ref 13.0–17.0)
MCH: 29.6 pg (ref 26.0–34.0)
MCHC: 33.6 g/dL (ref 30.0–36.0)
MCV: 88.3 fL (ref 78.0–100.0)
Platelets: 190 10*3/uL (ref 150–400)
RBC: 3.41 MIL/uL — ABNORMAL LOW (ref 4.22–5.81)
RDW: 14 % (ref 11.5–15.5)
WBC: 9.1 10*3/uL (ref 4.0–10.5)

## 2012-01-19 LAB — GLUCOSE, CAPILLARY: Glucose-Capillary: 188 mg/dL — ABNORMAL HIGH (ref 70–99)

## 2012-01-19 MED ORDER — PANTOPRAZOLE SODIUM 40 MG PO TBEC: 40.0000 mg | DELAYED_RELEASE_TABLET | Freq: Every day | ORAL | Status: DC

## 2012-01-19 NOTE — Plan of Care (Signed)
Problem: Phase III Progression Outcomes Goal: Vascular site scale level 0 - I Vascular Site Scale Level 0: No bruising/bleeding/hematoma Level I (Mild): Bruising/Ecchymosis, minimal bleeding/ooozing, palpable hematoma < 3 cm Level II (Moderate): Bleeding not affecting hemodynamic parameters, pseudoaneurysm, palpable hematoma > 3 cm Outcome: Adequate for Discharge Hematoma on R groin site continues but in stages of healing. MD Croitoru aware of site on his assesment. Groin at level II.

## 2012-01-19 NOTE — Discharge Instructions (Signed)
Heart Healthy Diabetic Diet.  No strenuous activity over the weekend.   Have the blood work already planned for Wed. Done on Thursday before you see Dr. Claiborne Billings.   Call if further problems with the right groin.  We will recheck your groin in the office on Thursday before or after your appt with Dr. Claiborne Billings.  The office will call you on Monday with the time.

## 2012-01-19 NOTE — Progress Notes (Signed)
Subjective: S/p thrombin injection for pseudoaneurysm.   Objective: Vital signs in last 24 hours: Temp:  [98 F (36.7 C)-98.8 F (37.1 C)] 98 F (36.7 C) (06/15 0500) Pulse Rate:  [54-69] 54  (06/15 0500) Resp:  [18] 18  (06/15 0500) BP: (134-160)/(68-82) 154/82 mmHg (06/15 0500) SpO2:  [94 %-97 %] 95 % (06/15 0500) Weight change:  Last BM Date: 01/17/12 Intake/Output from previous day:   Intake/Output this shift:    PE: General:alert and oriented, no throbbing pain now after thrombin injection. Heart:S1S2, RRR Lungs:clear Abd:+ BS, soft, non tender Ext:Rt. Groin with large hematoma, with ecchymosis to bend of the knee.    Lab Results:  Basename 01/19/12 0530 01/18/12 1500  WBC 9.1 8.9  HGB 10.1* 10.2*  HCT 30.1* 30.3*  PLT 190 202   BMET  Basename 01/18/12 1500  NA 138  K 3.7  CL 100  CO2 28  GLUCOSE 223*  BUN 23  CREATININE 1.22  CALCIUM 9.1   No results found for this basename: TROPONINI:2,CK,MB:2 in the last 72 hours  No results found for this basename: CHOL, HDL, LDLCALC, LDLDIRECT, TRIG, CHOLHDL   No results found for this basename: HGBA1C     No results found for this basename: TSH    Hepatic Function Panel No results found for this basename: PROT,ALBUMIN,AST,ALT,ALKPHOS,BILITOT,BILIDIR,IBILI in the last 72 hours No results found for this basename: CHOL in the last 72 hours No results found for this basename: PROTIME in the last 72 hours    EKG: Orders placed during the hospital encounter of 01/14/12  . EKG 12-LEAD  . EKG 12-LEAD  . EKG 12-LEAD  . EKG 12-LEAD  . EKG 12-LEAD  . EKG 12-LEAD  . EKG 12-LEAD  . EKG 12-LEAD  . EKG    Studies/Results: No results found.  Medications: I have reviewed the patient's current medications.    Marland Kitchen aspirin  81 mg Oral Daily  . atorvastatin  10 mg Oral q1800  . clopidogrel  75 mg Oral Q breakfast  . felodipine  5 mg Oral Daily  . glyBURIDE  10 mg Oral BID WC   And  . metFORMIN  1,000 mg Oral  BID WC  . hydrochlorothiazide  25 mg Oral Daily  .  HYDROmorphone (DILAUDID) injection  1 mg Intravenous Once  . insulin aspart  0-5 Units Subcutaneous QHS  . insulin aspart  0-9 Units Subcutaneous TID WC  . isosorbide mononitrate  30 mg Oral Daily  . lidocaine  5 mL Intradermal Once  . metoprolol succinate  100 mg Oral Q breakfast  . metoprolol succinate  50 mg Oral Q supper  . midazolam  2 mg Intravenous Once  . pantoprazole  40 mg Oral Q1200  . ranolazine  500 mg Oral BID  . sitaGLIPtin  100 mg Oral Daily  . sodium chloride  3 mL Intravenous Q12H  . thrombin  1,000 Units Topical Once  . vitamin C  500 mg Oral QHS,MR X 1  . DISCONTD: aspirin  81 mg Oral Daily  . DISCONTD: glyBURIDE-metformin  2 tablet Oral BID WC  . DISCONTD: isosorbide dinitrate  30 mg Oral Daily  . DISCONTD: lidocaine  5 mL Intradermal Once  . DISCONTD: metoprolol succinate  50-100 mg Oral BID   Assessment/Plan: Patient Active Problem List  Diagnosis  . CAD, CABG '97, PCI '98, "04, with LAD DES and CFX PTCA 01/15/12 after abn Nuc.  Marland Kitchen HTN (hypertension)  . Diabetes mellitus  . Renal insufficiency, Scr  1.86 01/09/12 and 0.97 on 01/15/12 ?  Marland Kitchen PVC (premature ventricular contraction)  . 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  Plan to d/c home and follow up with Dr. Claiborne Billings next week.  LOS: 1 day   INGOLD,LAURA R 01/19/2012, 9:00 AM   I have seen and examined the patient along with [facility] R, NP.  I have reviewed the chart, notes and new data.  I agree with NP's note.  Key new complaints: less sore Key examination changes: very large hematoma and extensive ecchymosis, not growing since thrombin injection as per marked edges Key new findings / data: Hgb stable at 10.1-10.2  PLAN: DC home. Recheck Doppler next Thursday when he has follow up appointment with Dr. Claiborne Billings.  Sanda Klein, MD, [place] [phone] 01/19/2012,  10:02 AM

## 2012-01-21 NOTE — Discharge Summary (Signed)
Physician Discharge Summary  Patient ID: Roberto Haley MRN: [id number] DOB/AGE: 1940/10/30 71 y.o.  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 Active Problems:  CAD, CABG '97, PCI '98, "04, with LAD DES and CFX PTCA 01/15/12 after abn Nuc.  HTN (hypertension)  Diabetes mellitus  Renal insufficiency, Scr 1.86 01/09/12 and 0.97 on 01/15/12 ?  PVC (premature ventricular contraction)   Discharged Condition: good  Hospital Course: 71 y/o male with a history of CAD, CABG in '97, PCI '98 and 4/04. He recently saw Dr Claiborne Billings as an OP with complaints of chest pain. A Myoview was done that was abnormal. He was admitted 01/14/12 for cath. This was done and revealed LAD and CFX disease. He underwent elective LAD DES and CFX PTCA. Dr Debara Pickett felt he could be discharge 01/16/12. Interestingly, his SCr was 1.86 on 01/09/12 but repeat pre cath 01/15/12 was 0.97. We will held his ACE-I and continued HCTZ. His Metformin was held for 48hrs post cath.  On 01/17/12 afternoon he called complaining of increasing pain and swelling in his Rt groin. He says he did have some ecchymosis at the site at discharge but no swelling. We asked him to come to the office 01/18/12. US revealed 3.1 X 3.4 X 3.1 pseudoaneurysm with surrounding hematoma. He was then admitted for thrombin injection.  He underwent thrombin injection without complications and had closure of his pseudoaneurysm.  He was kept overnight in the hospital for monitoring. The pain that he had previously was resolved he continued with moderate size hematoma of the right groin.  Pedal pulse on the right was 2+.  By the next morning 01/19/2012 he was stable and doing in the hall without any complications. He was seen by Dr. Sallyanne Kuster felt he was stable and ready for discharge home.    We will followup outpatient arterial Doppler of the  right groin on Thursday when he follows up with Dr. Claiborne Billings.  His creatinine remains stable we will recheck that also on Thursday prior to the appointment with Dr. Claiborne Billings.   Consults: None  Significant Diagnostic Studies: Labs at discharge sodium 138 potassium 3.7 chloride 100 CO2 28 BUN 23 creatinine 1.22 calcium 9.1 glucose 223 hemoglobin 10.1 hematocrit 30.1 WBC 9.1 platelets 190   Patient maintained sinus rhythm in the hospital.  Discharge Exam: Blood pressure 154/82, pulse 54, temperature 98 F (36.7 C), temperature source Oral, resp. rate 18, SpO2 95.00%.   General:alert and oriented, no throbbing pain now after thrombin injection.  Heart:S1S2, RRR  Lungs:clear  Abd:+ BS, soft, non tender  Ext:Rt. Groin with large hematoma, with ecchymosis to bend of the knee.     Disposition: 01-Home or Self Care   Medication List  As of 01/21/2012  2:22 PM   STOP taking these medications         omeprazole 40 MG capsule      ONGLYZA 5 MG Tabs tablet         TAKE these medications         acetaminophen 325 MG tablet   Commonly known as: TYLENOL   Take 650 mg by mouth every 4 (four) hours as needed. For pain      aspirin 81 MG tablet   Take 81 mg by mouth daily.      benazepril 40 MG tablet   Commonly known as: LOTENSIN   Take 40 mg by mouth  daily.      clopidogrel 75 MG tablet   Commonly known as: PLAVIX   Take 75 mg by mouth daily.      felodipine 10 MG 24 hr tablet   Commonly known as: PLENDIL   Take 10 mg by mouth daily.      glyBURIDE-metformin 5-500 MG per tablet   Commonly known as: GLUCOVANCE   Take 2 tablets by mouth 2 (two) times daily with a meal.      hydrochlorothiazide 25 MG tablet   Commonly known as: HYDRODIURIL   Take 25 mg by mouth daily.      isosorbide mononitrate 30 MG 24 hr tablet   Commonly known as: IMDUR   Take 30 mg by mouth daily.      metoprolol succinate 100 MG 24 hr tablet   Commonly known as: TOPROL-XL   Take 50-100 mg by mouth 2  (two) times daily. Takes 100 mg in am and 50 mg at night. Take with or immediately following a meal.      multivitamin with minerals Tabs   Take 1 tablet by mouth daily.      nitroGLYCERIN 0.4 MG SL tablet   Commonly known as: NITROSTAT   Place 0.4 mg under the tongue every 5 (five) minutes as needed. For chest pain      pantoprazole 40 MG tablet   Commonly known as: PROTONIX   Take 1 tablet (40 mg total) by mouth daily at 12 noon.      RANEXA 500 MG 12 hr tablet   Generic drug: ranolazine   Take 500 mg by mouth 2 (two) times daily.      rosuvastatin 10 MG tablet   Commonly known as: CRESTOR   Take 10 mg by mouth daily.      sitaGLIPtin 100 MG tablet   Commonly known as: JANUVIA   Take 100 mg by mouth daily.      vitamin C 500 MG tablet   Commonly known as: ASCORBIC ACID   Take 500 mg by mouth at bedtime and may repeat dose one time if needed.           Follow-up Information    Follow up with Troy Sine, MD on 01/24/2012. (at 11:30AM)    Contact information:   [address] [place] [phone]         Discharge instructions: Heart Healthy Diabetic Diet.  No strenuous activity over the weekend.   Have the blood work already planned for Wed. Done on Thursday before you see Dr. Claiborne Billings.   Call if further problems with the right groin.  We will recheck your groin in the office on Thursday before or after your appt with Dr. Claiborne Billings.  The office will call you on Monday with the time.    SignedIsaiah Serge 01/21/2012, 2:22 PM

## 2012-01-24 DIAGNOSIS — I724 Aneurysm of artery of lower extremity: Secondary | ICD-10-CM | POA: Diagnosis not present

## 2012-01-24 DIAGNOSIS — Z79899 Other long term (current) drug therapy: Secondary | ICD-10-CM | POA: Diagnosis not present

## 2012-01-24 DIAGNOSIS — E119 Type 2 diabetes mellitus without complications: Secondary | ICD-10-CM | POA: Diagnosis not present

## 2012-01-24 DIAGNOSIS — I119 Hypertensive heart disease without heart failure: Secondary | ICD-10-CM | POA: Diagnosis not present

## 2012-01-24 DIAGNOSIS — I2510 Atherosclerotic heart disease of native coronary artery without angina pectoris: Secondary | ICD-10-CM | POA: Diagnosis not present

## 2012-01-24 HISTORY — PX: OTHER SURGICAL HISTORY: SHX169

## 2012-02-05 DIAGNOSIS — I2510 Atherosclerotic heart disease of native coronary artery without angina pectoris: Secondary | ICD-10-CM | POA: Diagnosis not present

## 2012-02-05 DIAGNOSIS — M79609 Pain in unspecified limb: Secondary | ICD-10-CM | POA: Diagnosis not present

## 2012-02-05 DIAGNOSIS — R609 Edema, unspecified: Secondary | ICD-10-CM | POA: Diagnosis not present

## 2012-02-05 DIAGNOSIS — M7989 Other specified soft tissue disorders: Secondary | ICD-10-CM | POA: Diagnosis not present

## 2012-02-05 DIAGNOSIS — Z79899 Other long term (current) drug therapy: Secondary | ICD-10-CM | POA: Diagnosis not present

## 2012-02-05 DIAGNOSIS — R5381 Other malaise: Secondary | ICD-10-CM | POA: Diagnosis not present

## 2012-02-05 HISTORY — PX: OTHER SURGICAL HISTORY: SHX169

## 2012-02-13 DIAGNOSIS — I2510 Atherosclerotic heart disease of native coronary artery without angina pectoris: Secondary | ICD-10-CM | POA: Diagnosis not present

## 2012-02-13 DIAGNOSIS — E119 Type 2 diabetes mellitus without complications: Secondary | ICD-10-CM | POA: Diagnosis not present

## 2012-02-13 DIAGNOSIS — E785 Hyperlipidemia, unspecified: Secondary | ICD-10-CM | POA: Diagnosis not present

## 2012-02-13 DIAGNOSIS — Z6834 Body mass index (BMI) 34.0-34.9, adult: Secondary | ICD-10-CM | POA: Diagnosis not present

## 2012-02-14 DIAGNOSIS — E785 Hyperlipidemia, unspecified: Secondary | ICD-10-CM | POA: Diagnosis not present

## 2012-02-14 DIAGNOSIS — Z79899 Other long term (current) drug therapy: Secondary | ICD-10-CM | POA: Diagnosis not present

## 2012-02-14 DIAGNOSIS — E119 Type 2 diabetes mellitus without complications: Secondary | ICD-10-CM | POA: Diagnosis not present

## 2012-02-14 DIAGNOSIS — I2510 Atherosclerotic heart disease of native coronary artery without angina pectoris: Secondary | ICD-10-CM | POA: Diagnosis not present

## 2012-02-19 DIAGNOSIS — I2510 Atherosclerotic heart disease of native coronary artery without angina pectoris: Secondary | ICD-10-CM | POA: Diagnosis not present

## 2012-02-19 DIAGNOSIS — R002 Palpitations: Secondary | ICD-10-CM | POA: Diagnosis not present

## 2012-02-19 DIAGNOSIS — Z9861 Coronary angioplasty status: Secondary | ICD-10-CM | POA: Diagnosis not present

## 2012-02-19 DIAGNOSIS — Z79899 Other long term (current) drug therapy: Secondary | ICD-10-CM | POA: Diagnosis not present

## 2012-02-19 DIAGNOSIS — R5383 Other fatigue: Secondary | ICD-10-CM | POA: Diagnosis not present

## 2012-02-19 DIAGNOSIS — E782 Mixed hyperlipidemia: Secondary | ICD-10-CM | POA: Diagnosis not present

## 2012-02-19 DIAGNOSIS — S301XXA Contusion of abdominal wall, initial encounter: Secondary | ICD-10-CM

## 2012-02-19 HISTORY — DX: Contusion of abdominal wall, initial encounter: S30.1XXA

## 2012-03-07 DIAGNOSIS — Z6834 Body mass index (BMI) 34.0-34.9, adult: Secondary | ICD-10-CM | POA: Diagnosis not present

## 2012-03-07 DIAGNOSIS — R21 Rash and other nonspecific skin eruption: Secondary | ICD-10-CM | POA: Diagnosis not present

## 2012-03-08 DIAGNOSIS — T7840XA Allergy, unspecified, initial encounter: Secondary | ICD-10-CM | POA: Diagnosis not present

## 2012-03-18 DIAGNOSIS — R0989 Other specified symptoms and signs involving the circulatory and respiratory systems: Secondary | ICD-10-CM | POA: Diagnosis not present

## 2012-03-18 DIAGNOSIS — I2510 Atherosclerotic heart disease of native coronary artery without angina pectoris: Secondary | ICD-10-CM | POA: Diagnosis not present

## 2012-03-18 DIAGNOSIS — R609 Edema, unspecified: Secondary | ICD-10-CM | POA: Diagnosis not present

## 2012-03-25 DIAGNOSIS — I2510 Atherosclerotic heart disease of native coronary artery without angina pectoris: Secondary | ICD-10-CM | POA: Diagnosis not present

## 2012-03-25 DIAGNOSIS — R5381 Other malaise: Secondary | ICD-10-CM | POA: Diagnosis not present

## 2012-03-25 DIAGNOSIS — E119 Type 2 diabetes mellitus without complications: Secondary | ICD-10-CM | POA: Diagnosis not present

## 2012-03-25 DIAGNOSIS — R0602 Shortness of breath: Secondary | ICD-10-CM | POA: Diagnosis not present

## 2012-03-25 DIAGNOSIS — Z79899 Other long term (current) drug therapy: Secondary | ICD-10-CM | POA: Diagnosis not present

## 2012-03-25 DIAGNOSIS — R5383 Other fatigue: Secondary | ICD-10-CM | POA: Diagnosis not present

## 2012-03-25 DIAGNOSIS — I509 Heart failure, unspecified: Secondary | ICD-10-CM | POA: Diagnosis not present

## 2012-04-09 DIAGNOSIS — E119 Type 2 diabetes mellitus without complications: Secondary | ICD-10-CM | POA: Diagnosis not present

## 2012-04-09 DIAGNOSIS — E782 Mixed hyperlipidemia: Secondary | ICD-10-CM | POA: Diagnosis not present

## 2012-04-09 DIAGNOSIS — I2510 Atherosclerotic heart disease of native coronary artery without angina pectoris: Secondary | ICD-10-CM | POA: Diagnosis not present

## 2012-04-14 DIAGNOSIS — R002 Palpitations: Secondary | ICD-10-CM | POA: Diagnosis not present

## 2012-04-14 DIAGNOSIS — Z6834 Body mass index (BMI) 34.0-34.9, adult: Secondary | ICD-10-CM | POA: Diagnosis not present

## 2012-04-14 DIAGNOSIS — E119 Type 2 diabetes mellitus without complications: Secondary | ICD-10-CM | POA: Diagnosis not present

## 2012-04-14 DIAGNOSIS — D649 Anemia, unspecified: Secondary | ICD-10-CM | POA: Diagnosis not present

## 2012-04-22 DIAGNOSIS — R002 Palpitations: Secondary | ICD-10-CM | POA: Diagnosis not present

## 2012-05-07 DIAGNOSIS — E119 Type 2 diabetes mellitus without complications: Secondary | ICD-10-CM | POA: Diagnosis not present

## 2012-05-07 DIAGNOSIS — R5381 Other malaise: Secondary | ICD-10-CM | POA: Diagnosis not present

## 2012-05-07 DIAGNOSIS — I2510 Atherosclerotic heart disease of native coronary artery without angina pectoris: Secondary | ICD-10-CM | POA: Diagnosis not present

## 2012-05-07 DIAGNOSIS — R5383 Other fatigue: Secondary | ICD-10-CM | POA: Diagnosis not present

## 2012-05-12 DIAGNOSIS — E039 Hypothyroidism, unspecified: Secondary | ICD-10-CM | POA: Diagnosis not present

## 2012-05-30 DIAGNOSIS — B356 Tinea cruris: Secondary | ICD-10-CM | POA: Diagnosis not present

## 2012-06-06 DIAGNOSIS — D649 Anemia, unspecified: Secondary | ICD-10-CM | POA: Diagnosis not present

## 2012-06-06 DIAGNOSIS — Z23 Encounter for immunization: Secondary | ICD-10-CM | POA: Diagnosis not present

## 2012-06-06 DIAGNOSIS — E039 Hypothyroidism, unspecified: Secondary | ICD-10-CM | POA: Diagnosis not present

## 2012-06-06 DIAGNOSIS — Z6834 Body mass index (BMI) 34.0-34.9, adult: Secondary | ICD-10-CM | POA: Diagnosis not present

## 2012-06-06 DIAGNOSIS — E785 Hyperlipidemia, unspecified: Secondary | ICD-10-CM | POA: Diagnosis not present

## 2012-06-06 DIAGNOSIS — IMO0001 Reserved for inherently not codable concepts without codable children: Secondary | ICD-10-CM | POA: Diagnosis not present

## 2012-06-06 DIAGNOSIS — I2510 Atherosclerotic heart disease of native coronary artery without angina pectoris: Secondary | ICD-10-CM | POA: Diagnosis not present

## 2012-06-09 DIAGNOSIS — E119 Type 2 diabetes mellitus without complications: Secondary | ICD-10-CM | POA: Diagnosis not present

## 2012-06-09 DIAGNOSIS — E782 Mixed hyperlipidemia: Secondary | ICD-10-CM | POA: Diagnosis not present

## 2012-06-09 DIAGNOSIS — R609 Edema, unspecified: Secondary | ICD-10-CM | POA: Diagnosis not present

## 2012-06-09 DIAGNOSIS — I2510 Atherosclerotic heart disease of native coronary artery without angina pectoris: Secondary | ICD-10-CM | POA: Diagnosis not present

## 2012-09-23 DIAGNOSIS — E039 Hypothyroidism, unspecified: Secondary | ICD-10-CM | POA: Diagnosis not present

## 2012-09-23 DIAGNOSIS — E785 Hyperlipidemia, unspecified: Secondary | ICD-10-CM | POA: Diagnosis not present

## 2012-09-23 DIAGNOSIS — I2510 Atherosclerotic heart disease of native coronary artery without angina pectoris: Secondary | ICD-10-CM | POA: Diagnosis not present

## 2012-09-23 DIAGNOSIS — Z1331 Encounter for screening for depression: Secondary | ICD-10-CM | POA: Diagnosis not present

## 2012-09-23 DIAGNOSIS — D649 Anemia, unspecified: Secondary | ICD-10-CM | POA: Diagnosis not present

## 2012-09-23 DIAGNOSIS — Z6835 Body mass index (BMI) 35.0-35.9, adult: Secondary | ICD-10-CM | POA: Diagnosis not present

## 2012-09-23 DIAGNOSIS — Z9181 History of falling: Secondary | ICD-10-CM | POA: Diagnosis not present

## 2012-10-17 DIAGNOSIS — Z6834 Body mass index (BMI) 34.0-34.9, adult: Secondary | ICD-10-CM | POA: Diagnosis not present

## 2012-10-17 DIAGNOSIS — I10 Essential (primary) hypertension: Secondary | ICD-10-CM | POA: Diagnosis not present

## 2012-10-17 DIAGNOSIS — T887XXA Unspecified adverse effect of drug or medicament, initial encounter: Secondary | ICD-10-CM | POA: Diagnosis not present

## 2012-11-03 DIAGNOSIS — E039 Hypothyroidism, unspecified: Secondary | ICD-10-CM | POA: Diagnosis not present

## 2012-11-03 DIAGNOSIS — I2510 Atherosclerotic heart disease of native coronary artery without angina pectoris: Secondary | ICD-10-CM | POA: Diagnosis not present

## 2012-11-03 DIAGNOSIS — Z6836 Body mass index (BMI) 36.0-36.9, adult: Secondary | ICD-10-CM | POA: Diagnosis not present

## 2012-11-13 DIAGNOSIS — R51 Headache: Secondary | ICD-10-CM | POA: Diagnosis not present

## 2012-11-13 DIAGNOSIS — J019 Acute sinusitis, unspecified: Secondary | ICD-10-CM | POA: Diagnosis not present

## 2012-11-13 DIAGNOSIS — Z6834 Body mass index (BMI) 34.0-34.9, adult: Secondary | ICD-10-CM | POA: Diagnosis not present

## 2012-11-17 DIAGNOSIS — R4182 Altered mental status, unspecified: Secondary | ICD-10-CM | POA: Diagnosis not present

## 2012-11-17 DIAGNOSIS — R0602 Shortness of breath: Secondary | ICD-10-CM | POA: Diagnosis not present

## 2012-11-17 DIAGNOSIS — G319 Degenerative disease of nervous system, unspecified: Secondary | ICD-10-CM | POA: Diagnosis not present

## 2012-11-17 DIAGNOSIS — Z6834 Body mass index (BMI) 34.0-34.9, adult: Secondary | ICD-10-CM | POA: Diagnosis not present

## 2012-11-17 DIAGNOSIS — I517 Cardiomegaly: Secondary | ICD-10-CM | POA: Diagnosis not present

## 2012-11-20 DIAGNOSIS — G309 Alzheimer's disease, unspecified: Secondary | ICD-10-CM | POA: Diagnosis not present

## 2012-11-20 DIAGNOSIS — J90 Pleural effusion, not elsewhere classified: Secondary | ICD-10-CM | POA: Diagnosis not present

## 2012-11-20 DIAGNOSIS — Z6833 Body mass index (BMI) 33.0-33.9, adult: Secondary | ICD-10-CM | POA: Diagnosis not present

## 2012-11-20 DIAGNOSIS — F0280 Dementia in other diseases classified elsewhere without behavioral disturbance: Secondary | ICD-10-CM | POA: Diagnosis not present

## 2012-11-20 DIAGNOSIS — I10 Essential (primary) hypertension: Secondary | ICD-10-CM | POA: Diagnosis not present

## 2012-12-01 DIAGNOSIS — R32 Unspecified urinary incontinence: Secondary | ICD-10-CM | POA: Diagnosis not present

## 2012-12-01 DIAGNOSIS — J019 Acute sinusitis, unspecified: Secondary | ICD-10-CM | POA: Diagnosis not present

## 2012-12-01 DIAGNOSIS — Z6833 Body mass index (BMI) 33.0-33.9, adult: Secondary | ICD-10-CM | POA: Diagnosis not present

## 2012-12-09 DIAGNOSIS — H524 Presbyopia: Secondary | ICD-10-CM | POA: Diagnosis not present

## 2012-12-09 DIAGNOSIS — E119 Type 2 diabetes mellitus without complications: Secondary | ICD-10-CM | POA: Diagnosis not present

## 2012-12-12 DIAGNOSIS — R51 Headache: Secondary | ICD-10-CM | POA: Diagnosis not present

## 2012-12-12 DIAGNOSIS — R413 Other amnesia: Secondary | ICD-10-CM | POA: Diagnosis not present

## 2012-12-16 ENCOUNTER — Telehealth: Payer: Self-pay | Admitting: Cardiovascular Disease

## 2012-12-16 DIAGNOSIS — R51 Headache: Secondary | ICD-10-CM | POA: Diagnosis not present

## 2012-12-16 NOTE — Telephone Encounter (Signed)
Spoke with [name] informing her per Kerin Ransom that it is okay for the patient to get his MRI done.

## 2012-12-16 NOTE — Telephone Encounter (Signed)
Yes, OK to have MRI

## 2012-12-16 NOTE — Telephone Encounter (Signed)
S/W patients dtr, she called on behalf of her father. He couldn't come to the phone due to having a severe headache. She called to question if okay to have MRI head. States he had bypass surgery in 1997 and some "wires" holding his chest together. He also had some stent placements in June 2013. Wants to know if it is safe for MRI. I informed her that I will need to speak with the PA and call her back with recommendations.

## 2012-12-16 NOTE — Telephone Encounter (Signed)
Wants to know if it is all right for him to have a mri?

## 2012-12-18 DIAGNOSIS — Z6832 Body mass index (BMI) 32.0-32.9, adult: Secondary | ICD-10-CM | POA: Diagnosis not present

## 2012-12-18 DIAGNOSIS — I10 Essential (primary) hypertension: Secondary | ICD-10-CM | POA: Diagnosis not present

## 2012-12-18 DIAGNOSIS — G4730 Sleep apnea, unspecified: Secondary | ICD-10-CM | POA: Diagnosis not present

## 2012-12-18 DIAGNOSIS — R413 Other amnesia: Secondary | ICD-10-CM | POA: Diagnosis not present

## 2012-12-19 DIAGNOSIS — R413 Other amnesia: Secondary | ICD-10-CM | POA: Diagnosis not present

## 2012-12-19 DIAGNOSIS — R51 Headache: Secondary | ICD-10-CM | POA: Diagnosis not present

## 2012-12-22 DIAGNOSIS — J329 Chronic sinusitis, unspecified: Secondary | ICD-10-CM | POA: Diagnosis not present

## 2012-12-22 DIAGNOSIS — R51 Headache: Secondary | ICD-10-CM | POA: Diagnosis not present

## 2012-12-22 DIAGNOSIS — J3489 Other specified disorders of nose and nasal sinuses: Secondary | ICD-10-CM | POA: Diagnosis not present

## 2012-12-25 ENCOUNTER — Encounter: Payer: Self-pay | Admitting: *Deleted

## 2012-12-26 ENCOUNTER — Ambulatory Visit (INDEPENDENT_AMBULATORY_CARE_PROVIDER_SITE_OTHER): Payer: Medicare Other | Admitting: Cardiovascular Disease

## 2012-12-26 ENCOUNTER — Encounter: Payer: Self-pay | Admitting: Cardiovascular Disease

## 2012-12-26 VITALS — BP 138/82 | HR 63 | Ht 68.0 in | Wt 213.0 lb

## 2012-12-26 DIAGNOSIS — N289 Disorder of kidney and ureter, unspecified: Secondary | ICD-10-CM

## 2012-12-26 DIAGNOSIS — E119 Type 2 diabetes mellitus without complications: Secondary | ICD-10-CM

## 2012-12-26 DIAGNOSIS — I4949 Other premature depolarization: Secondary | ICD-10-CM

## 2012-12-26 DIAGNOSIS — I10 Essential (primary) hypertension: Secondary | ICD-10-CM

## 2012-12-26 DIAGNOSIS — R0602 Shortness of breath: Secondary | ICD-10-CM

## 2012-12-26 DIAGNOSIS — I2510 Atherosclerotic heart disease of native coronary artery without angina pectoris: Secondary | ICD-10-CM | POA: Diagnosis not present

## 2012-12-26 DIAGNOSIS — I493 Ventricular premature depolarization: Secondary | ICD-10-CM

## 2012-12-26 MED ORDER — METOPROLOL SUCCINATE ER 100 MG PO TB24: 100.0000 mg | ORAL_TABLET | Freq: Every day | ORAL | Status: DC

## 2012-12-26 NOTE — Patient Instructions (Signed)
Your physician has requested that you have an [facility]. For further information please visit [email or web address]. Please follow instruction sheet, as given.  Your physician has requested that you have an echocardiogram. Echocardiography is a painless test that uses sound waves to create images of your heart. It provides your doctor with information about the size and shape of your heart and how well your heart's chambers and valves are working. This procedure takes approximately one hour. There are no restrictions for this procedure.  Your physician has recommended you make the following change in your medication: Increase metoprolol to 100 mg in the morning and 50 mg in the evening.  Your physician recommends that you schedule a follow-up appointment in: 4 weeks to discuss findings of tests.

## 2012-12-28 ENCOUNTER — Encounter: Payer: Self-pay | Admitting: Cardiovascular Disease

## 2012-12-28 NOTE — Progress Notes (Signed)
Patient ID: RAESHAUN CROSS, male   DOB: [birth year], 72 y.o.   MRN: [id number]  HPI: TAYSEAN STANCZAK, is a 72 y.o. male who presents to the office for six-month cardiology followup evaluation. Patient has known coronary artery disease and underwent CABG revascularization surgery in 1997 with LIMA to LAD, vein to the right artery. In March 1998 he underwent complex intervention to his native LAD and HiSpeed rotational atherectomy and stenting of his LAD and PTCA of his diagonal vessel after he was found to have stenosis in the LIMA graft. In June 2013 due to 99% in-stent restenosis in the LAD stent and a new stenosis of 90% the distal circumflex, he underwent two-vessel intervention with insertion of a 2.75x22 mm resident stent in the LAD and PTCA of the distal circumflex via his previously placed stent which started in the mid left main coronary artery. A cardiopulmonary test did show a reduced maximum oxygen consumption. I did reduce his beta blocker therapy due to significant chronotropic incompetence, and further titrate his Ranexa to 1000 mg twice a day.  Over the past 6 months, he has had some memory issues.he has had some mild leg swelling has been taking his Lasix 20 mg every other day. He has been taking Toprol 100 mg in the morning. He does note some occasional shortness of breath with exercise mild activity. He has not been able to be active.at times he also has noticed chest pressure. He presents for evaluation.   Past Medical History  Diagnosis Date  . Coronary artery disease   . Diabetes mellitus   . Groin hematoma 02/19/12    RIGHT  . Hyperlipidemia   . Hypertension   . Obesity   . PVC (premature ventricular contraction)   . Chronic kidney disease     CKD STAGE 3;  CHRONIC RENAL INSUFFICIENCY    Past Surgical History  Procedure Laterality Date  . Coronary artery bypass graft  1997    LIMA to the LAD and vein to the RCA;  . Cardiac catheterization  01/14/12    LV FXN EF 50-55% W/MILD  DISTAL INFERIOR HYPOKINESIS; PCI: LAD PTCA/STENT W/NEW 2.75X22 RESOLUTE DES IN MID LAD POST DIALTED TO 3.08 TO 2.97 TAPER: 99%-80% TO 0; LCX: PTCA VIA LM STENT W/2.25X12 SPRINTER BALLOON: 90%-5; LAD: PATENT PROXIMAL STENT EXTENDING INTO LM W/30-40% SMOOTH NARROWING IN DISTAL PORTION OF STENT; 99% IN STENT RESTENOSIS OF MID LAD STENT   . Lower venous duplex  02/05/12    ESSENTIALLY NORMAL RIGHT LOWER EXTRIMTY VENOUS DUPLEX DOPPLER EVALUATION  . Lower arterial doppler  01/24/12    ESSENTIALLY NORMAL RIGHT GROIN DUPLEX EVALUATION S/P THROMBIN INJECTION  . Nm myoview ltd  01/09/12    HIGH RISK SCAN. COMPARED TO PREVIOUS STUDY, THERE IS NOW ISCHEMIA PRESENT. ABNORMAL MYOCARDIAL PERFUSION STUDY. THERE IS NEW MILD INFEROLATERAL ISCHEMIA TOWARDS THE BASE; PT TO FOLLOW UP WITH DR. TK  . Doppler echocardiography  01/09/12    LV NORMAL IN SIZE, NORMAL WAL THICKNESS, EF 50-55%; MILD POSTERIOR WALL HYOPKINESIS, THERE IS MILD INFERIOR WALL HYPOKINESIS    Allergies  Allergen Reactions  . Tramadol Nausea And Vomiting  . Codeine Rash    Current Outpatient Prescriptions  Medication Sig Dispense Refill  . acetaminophen (TYLENOL) 325 MG tablet Take 650 mg by mouth every 4 (four) hours as needed. For pain      . aspirin 81 MG tablet Take 81 mg by mouth daily.      . benazepril (LOTENSIN) 40 MG tablet  Take 40 mg by mouth daily.      . bisacodyl (DULCOLAX) 5 MG EC tablet Take 5 mg by mouth at bedtime as needed for constipation.      . clopidogrel (PLAVIX) 75 MG tablet Take 75 mg by mouth daily.      Marland Kitchen docusate sodium (COLACE) 100 MG capsule Take 100 mg by mouth 2 (two) times daily as needed for constipation.      . fluticasone (FLONASE) 50 MCG/ACT nasal spray 2 sprays daily.      . furosemide (LASIX) 20 MG tablet 1 tablet as needed.      . gabapentin (NEURONTIN) 100 MG capsule 1 capsule at bedtime.      Marland Kitchen glipiZIDE (GLUCOTROL) 10 MG tablet 1 tablet 2 (two) times daily.      . isosorbide mononitrate (IMDUR) 30 MG 24  hr tablet Take 30 mg by mouth daily.      . metoprolol succinate (TOPROL-XL) 100 MG 24 hr tablet Take 1 tablet (100 mg total) by mouth daily. Takes 100 mg in am and 50 mg at night. Take with or immediately following a meal.  45 tablet  6  . mirabegron ER (MYRBETRIQ) 50 MG TB24 Take 50 mg by mouth daily.      . Multiple Vitamin (MULTIVITAMIN WITH MINERALS) TABS Take 1 tablet by mouth daily.      . nitroGLYCERIN (NITROSTAT) 0.4 MG SL tablet Place 0.4 mg under the tongue every 5 (five) minutes as needed. For chest pain      . potassium chloride SA (K-DUR,KLOR-CON) 20 MEQ tablet 1 tablet daily.      . ranitidine (ZANTAC) 150 MG tablet Take 150 mg by mouth 2 (two) times daily.      . rosuvastatin (CRESTOR) 10 MG tablet Take 10 mg by mouth daily.      . sitaGLIPtin (JANUVIA) 100 MG tablet Take 100 mg by mouth daily.      Marland Kitchen SYNTHROID 75 MCG tablet 1 tablet daily.      . vitamin C (ASCORBIC ACID) 500 MG tablet Take 500 mg by mouth at bedtime and may repeat dose one time if needed.       No current facility-administered medications for this visit.    Socially, he is married has one child 2 grandchildren and 2 great-grandchildren. There is no tobacco or alcohol use.  ROV is negative for fever chills night sweats. He denies palpitations. He does note shortness of breath with activity and also does note occasional chest discomfort. At times he has some mild constipation. He does note some issues. Time she does note a headache. He denies paresthesias. He does note some mild swelling. Other systems review is negative.  PE BP 138/82  Pulse 63  Ht 5\' 8"  (1.727 m)  Wt 213 lb (96.616 kg)  BMI 32.39 kg/m2  General: Alert, oriented, no distress.  HEENT: Normocephalic, atraumatic. Pupils round and reactive; sclera anicteric; F Nose without nasal septal hypertrophy Mouth/Parynx benign; Mallinpatti scale 3 Neck: No JVD, no carotid briuts Lungs: clear to ausculatation and percussion; no wheezing or  rales Heart: RRR, s1 s2 normal 2/6 sem, unchanged Abdomen: soft, nontender; no hepatosplenomehaly, BS+; abdominal aorta nontender and not dilated by palpation. Mild central adiposity Pulses 2+ Extremities: no clubbinbg cyanosis or edema, Homan's sign negative  Neurologic: grossly nonfocal    ECG reveals sinus rhythm with premature atrial complexes. He had LVH by voltage criteria in aVL. QTc interval 452 ms.  LABS:  BMET  Component Value Date/Time   NA 138 01/18/2012 1500   K 3.7 01/18/2012 1500   CL 100 01/18/2012 1500   CO2 28 01/18/2012 1500   GLUCOSE 223* 01/18/2012 1500   BUN 23 01/18/2012 1500   CREATININE 1.22 01/18/2012 1500   CALCIUM 9.1 01/18/2012 1500   GFRNONAA 59* 01/18/2012 1500   GFRAA 68* 01/18/2012 1500     Hepatic Function Panel  No results found for this basename: prot, albumin, ast, alt, alkphos, bilitot, bilidir, ibili     CBC    Component Value Date/Time   WBC 9.1 01/19/2012 0530   RBC 3.41* 01/19/2012 0530   HGB 10.1* 01/19/2012 0530   HCT 30.1* 01/19/2012 0530   PLT 190 01/19/2012 0530   MCV 88.3 01/19/2012 0530   MCH 29.6 01/19/2012 0530   MCHC 33.6 01/19/2012 0530   RDW 14.0 01/19/2012 0530     BNP No results found for this basename: probnp    Lipid Panel  No results found for this basename: chol, trig, hdl, cholhdl, vldl, ldlcalc     RADIOLOGY: No results found.    ASSESSMENT AND PLAN: Mr. Etheredge is now 17 years status post CABG revascularization surgery as ha a documented occluded LIMA. He previously undergone intervention both to his proximal LAD extending into the left main with stenting and stenting of his mid LAD. In June 2000 at 14 he developed severe in-stent restenosis of his LAD stent and a DES stent was in place at that time. We also went through the stent struts in the left main and a PTCA of the distal circumflex. He did have a mild/moderate disease in his PDA after his SVG to his PDA insertion. He does note shortness of breath. He  has noted some episodes of chest tightness. I am scheduling  a Myoview study for further evaluation to assess for restenosis particularly since his circumflex vessel only underwent PTCA and stenting. He has lost 5 pounds since his last office visit. He is taking Lasix as needed for his leg swelling at least every other day and I did instruct that he does no additional swelling this can be taken care of. Laboratory will be rechecked. I will see him back in the office for followup evaluation.      Troy Sine, MD, [facility]  12/28/2012 11:00 AM

## 2013-01-01 DIAGNOSIS — I10 Essential (primary) hypertension: Secondary | ICD-10-CM | POA: Diagnosis not present

## 2013-01-01 DIAGNOSIS — E039 Hypothyroidism, unspecified: Secondary | ICD-10-CM | POA: Diagnosis not present

## 2013-01-01 DIAGNOSIS — Z6832 Body mass index (BMI) 32.0-32.9, adult: Secondary | ICD-10-CM | POA: Diagnosis not present

## 2013-01-01 DIAGNOSIS — Z125 Encounter for screening for malignant neoplasm of prostate: Secondary | ICD-10-CM | POA: Diagnosis not present

## 2013-01-01 DIAGNOSIS — E785 Hyperlipidemia, unspecified: Secondary | ICD-10-CM | POA: Diagnosis not present

## 2013-01-13 ENCOUNTER — Ambulatory Visit (HOSPITAL_COMMUNITY)
Admission: RE | Admit: 2013-01-13 | Discharge: 2013-01-13 | Disposition: A | Discharge status: Home / Self Care | Payer: Medicare Other | Source: Ambulatory Visit | Attending: Cardiovascular Disease | Admitting: Cardiovascular Disease

## 2013-01-13 DIAGNOSIS — N289 Disorder of kidney and ureter, unspecified: Secondary | ICD-10-CM | POA: Insufficient documentation

## 2013-01-13 DIAGNOSIS — R5381 Other malaise: Secondary | ICD-10-CM | POA: Diagnosis not present

## 2013-01-13 DIAGNOSIS — R42 Dizziness and giddiness: Secondary | ICD-10-CM | POA: Insufficient documentation

## 2013-01-13 DIAGNOSIS — R079 Chest pain, unspecified: Secondary | ICD-10-CM | POA: Diagnosis not present

## 2013-01-13 DIAGNOSIS — E785 Hyperlipidemia, unspecified: Secondary | ICD-10-CM | POA: Diagnosis not present

## 2013-01-13 DIAGNOSIS — I10 Essential (primary) hypertension: Secondary | ICD-10-CM | POA: Insufficient documentation

## 2013-01-13 DIAGNOSIS — I2510 Atherosclerotic heart disease of native coronary artery without angina pectoris: Secondary | ICD-10-CM | POA: Diagnosis not present

## 2013-01-13 DIAGNOSIS — I379 Nonrheumatic pulmonary valve disorder, unspecified: Secondary | ICD-10-CM | POA: Diagnosis not present

## 2013-01-13 DIAGNOSIS — R002 Palpitations: Secondary | ICD-10-CM | POA: Diagnosis not present

## 2013-01-13 DIAGNOSIS — R0602 Shortness of breath: Secondary | ICD-10-CM | POA: Diagnosis not present

## 2013-01-13 DIAGNOSIS — E119 Type 2 diabetes mellitus without complications: Secondary | ICD-10-CM | POA: Insufficient documentation

## 2013-01-13 DIAGNOSIS — R0609 Other forms of dyspnea: Secondary | ICD-10-CM | POA: Insufficient documentation

## 2013-01-13 DIAGNOSIS — R0989 Other specified symptoms and signs involving the circulatory and respiratory systems: Secondary | ICD-10-CM | POA: Insufficient documentation

## 2013-01-13 DIAGNOSIS — I080 Rheumatic disorders of both mitral and aortic valves: Secondary | ICD-10-CM | POA: Insufficient documentation

## 2013-01-13 DIAGNOSIS — I079 Rheumatic tricuspid valve disease, unspecified: Secondary | ICD-10-CM | POA: Insufficient documentation

## 2013-01-13 DIAGNOSIS — I4949 Other premature depolarization: Secondary | ICD-10-CM | POA: Diagnosis not present

## 2013-01-13 MED ORDER — AMINOPHYLLINE 25 MG/ML IV SOLN
75.0000 mg | Freq: Once | INTRAVENOUS | Status: AC
  Administered 2013-01-13: 75 mg via INTRAVENOUS

## 2013-01-13 MED ORDER — TECHNETIUM TC 99M SESTAMIBI GENERIC - CARDIOLITE
10.0000 | Freq: Once | INTRAVENOUS | Status: AC | PRN
  Administered 2013-01-13: 10 via INTRAVENOUS

## 2013-01-13 MED ORDER — REGADENOSON 0.4 MG/5ML IV SOLN
0.4000 mg | Freq: Once | INTRAVENOUS | Status: AC
  Administered 2013-01-13: 0.4 mg via INTRAVENOUS

## 2013-01-13 MED ORDER — TECHNETIUM TC 99M SESTAMIBI GENERIC - CARDIOLITE
30.0000 | Freq: Once | INTRAVENOUS | Status: AC | PRN
  Administered 2013-01-13: 30 via INTRAVENOUS

## 2013-01-13 NOTE — Progress Notes (Signed)
2D Echo Performed 01/13/2013    Marygrace Drought, RCS

## 2013-01-13 NOTE — Procedures (Addendum)
[facility] [address] [place] [id number]  Cardiology Nuclear Med Study  Roberto Haley is a 72 y.o. male     MRN : [id number]     DOB: January 13, 1941  Procedure Date: 01/13/2013  Nuclear Med Background Indication for Stress Test:  Graft Patency and Stent Patency History:  CAD;CABG X2--1997;STENT LAD--1998 AND 2004;[id number] AND 2013 Cardiac Risk Factors: Family History - CAD, Hypertension, Lipids, NIDDM and Obesity  Symptoms:  Chest Pain, Dizziness, DOE, Fatigue, Light-Headedness, Palpitations and SOB   Nuclear Pre-Procedure Caffeine/Decaff Intake:  10:00pm NPO After: 8:00am   IV Site: R Forearm  IV 0.9% NS with Angio Cath:  22g  Chest Size (in):  40"  IV Started by: Azucena Cecil, RN  Height: 5\' 8"  (1.727 m)  Cup Size: n/a  BMI:  Body mass index is 32.39 kg/(m^2). Weight:  213 lb (96.616 kg)   Tech Comments:  N/A    Nuclear Med Study 1 or 2 day study: 1 day  Stress Test Type:  [place]  Order Authorizing Provider:  Shelva Majestic, MD   Resting Radionuclide: Technetium 31m Sestamibi  Resting Radionuclide Dose: 10.4 mCi   Stress Radionuclide:  Technetium 8m Sestamibi  Stress Radionuclide Dose: 30.4 mCi           Stress Protocol Rest HR: 60 Stress HR: 76  Rest BP: 187/107 Stress BP: 196/111  Exercise Time (min): n/a METS: n/a   Predicted Max HR: 150 bpm % Max HR: 50.67 bpm Rate Pressure Product: 16188  Dose of Adenosine (mg):  n/a Dose of Lexiscan: 0.4 mg  Dose of Atropine (mg): n/a Dose of Dobutamine: n/a mcg/kg/min (at max HR)  Stress Test Technologist: Leane Para, CCT Nuclear Technologist: Otho Perl, CNMT   Rest Procedure:  Myocardial perfusion imaging was performed at rest 45 minutes following the intravenous administration of Technetium 16m Sestamibi. Stress Procedure:  The patient received IV Lexiscan 0.4 mg over 15-seconds.  Technetium 67m Sestamibi injected at 30-seconds. The  patient experienced marked SOB and hypertensive response; 75 mg IV Aminophylline was administered with resolution of symptom.  There were no significant changes with Lexiscan.  Quantitative spect images were obtained after a 45 minute delay.  Transient Ischemic Dilatation (Normal <1.22):  1.07 Lung/Heart Ratio (Normal <0.45):  0.44 QGS EDV:  n/a ml QGS ESV:  n/a ml LV Ejection Fraction: Study not gated  Signed by      Rest ECG: NSR - Normal EKG  Stress ECG: Sinus rhythm with PAC's and PVC's  QPS Raw Data Images:  Normal; no motion artifact; normal heart/lung ratio. Stress Images:  Distal anteroseptal defect, small in size, moderate in intensity. Rest Images:  Comparison with the stress images reveals no significant change. Subtraction (SDS):  No evidence of ischemia.  Impression Exercise Capacity:  Lexiscan with no exercise. BP Response:  Hypertensive blood pressure response. Clinical Symptoms:  Mild shortness of breath ECG Impression:  No significant ST segment change suggestive of ischemia; occasional PAC's, PVC's. Comparison with Prior Nuclear Study: No images to compare  Overall Impression:  Low risk stress nuclear study demonstrating distal anteroseptal scar (extent 10%) without significant ischemia.  LV Wall Motion:  Not gated secondary to ectopy.   Troy Sine, MD  01/13/2013 7:57 PM

## 2013-01-14 DIAGNOSIS — S5010XA Contusion of unspecified forearm, initial encounter: Secondary | ICD-10-CM | POA: Diagnosis not present

## 2013-01-21 DIAGNOSIS — Z6833 Body mass index (BMI) 33.0-33.9, adult: Secondary | ICD-10-CM | POA: Diagnosis not present

## 2013-01-25 DIAGNOSIS — S20219A Contusion of unspecified front wall of thorax, initial encounter: Secondary | ICD-10-CM | POA: Diagnosis not present

## 2013-01-28 ENCOUNTER — Encounter: Payer: Self-pay | Admitting: *Deleted

## 2013-01-28 NOTE — Progress Notes (Signed)
Quick Note:  S/w wife gave results of nuc and echo. ______

## 2013-02-06 DIAGNOSIS — S065X0A Traumatic subdural hemorrhage without loss of consciousness, initial encounter: Secondary | ICD-10-CM | POA: Diagnosis not present

## 2013-02-06 DIAGNOSIS — IMO0002 Reserved for concepts with insufficient information to code with codable children: Secondary | ICD-10-CM | POA: Diagnosis not present

## 2013-02-06 DIAGNOSIS — Z9181 History of falling: Secondary | ICD-10-CM | POA: Diagnosis not present

## 2013-02-06 DIAGNOSIS — I491 Atrial premature depolarization: Secondary | ICD-10-CM | POA: Diagnosis not present

## 2013-02-06 DIAGNOSIS — R296 Repeated falls: Secondary | ICD-10-CM | POA: Diagnosis not present

## 2013-02-06 DIAGNOSIS — F29 Unspecified psychosis not due to a substance or known physiological condition: Secondary | ICD-10-CM | POA: Diagnosis not present

## 2013-02-06 DIAGNOSIS — S065X9A Traumatic subdural hemorrhage with loss of consciousness of unspecified duration, initial encounter: Secondary | ICD-10-CM

## 2013-02-06 DIAGNOSIS — M7989 Other specified soft tissue disorders: Secondary | ICD-10-CM | POA: Diagnosis not present

## 2013-02-06 DIAGNOSIS — M546 Pain in thoracic spine: Secondary | ICD-10-CM | POA: Diagnosis not present

## 2013-02-06 DIAGNOSIS — I6200 Nontraumatic subdural hemorrhage, unspecified: Secondary | ICD-10-CM | POA: Diagnosis not present

## 2013-02-06 DIAGNOSIS — Z5189 Encounter for other specified aftercare: Secondary | ICD-10-CM | POA: Diagnosis not present

## 2013-02-06 DIAGNOSIS — S065XAA Traumatic subdural hemorrhage with loss of consciousness status unknown, initial encounter: Secondary | ICD-10-CM | POA: Diagnosis not present

## 2013-02-06 DIAGNOSIS — R143 Flatulence: Secondary | ICD-10-CM | POA: Diagnosis not present

## 2013-02-06 DIAGNOSIS — D72829 Elevated white blood cell count, unspecified: Secondary | ICD-10-CM | POA: Diagnosis not present

## 2013-02-06 DIAGNOSIS — E785 Hyperlipidemia, unspecified: Secondary | ICD-10-CM | POA: Diagnosis not present

## 2013-02-06 DIAGNOSIS — Z7982 Long term (current) use of aspirin: Secondary | ICD-10-CM | POA: Diagnosis not present

## 2013-02-06 DIAGNOSIS — R4182 Altered mental status, unspecified: Secondary | ICD-10-CM | POA: Diagnosis not present

## 2013-02-06 DIAGNOSIS — R569 Unspecified convulsions: Secondary | ICD-10-CM | POA: Diagnosis not present

## 2013-02-06 DIAGNOSIS — F0390 Unspecified dementia without behavioral disturbance: Secondary | ICD-10-CM | POA: Diagnosis not present

## 2013-02-06 DIAGNOSIS — R6889 Other general symptoms and signs: Secondary | ICD-10-CM | POA: Diagnosis not present

## 2013-02-06 DIAGNOSIS — S0993XA Unspecified injury of face, initial encounter: Secondary | ICD-10-CM | POA: Diagnosis not present

## 2013-02-06 DIAGNOSIS — Z7902 Long term (current) use of antithrombotics/antiplatelets: Secondary | ICD-10-CM | POA: Diagnosis not present

## 2013-02-06 DIAGNOSIS — N390 Urinary tract infection, site not specified: Secondary | ICD-10-CM | POA: Diagnosis present

## 2013-02-06 DIAGNOSIS — E119 Type 2 diabetes mellitus without complications: Secondary | ICD-10-CM | POA: Diagnosis not present

## 2013-02-06 DIAGNOSIS — R29810 Facial weakness: Secondary | ICD-10-CM | POA: Diagnosis present

## 2013-02-06 DIAGNOSIS — Z781 Physical restraint status: Secondary | ICD-10-CM | POA: Diagnosis not present

## 2013-02-06 DIAGNOSIS — I10 Essential (primary) hypertension: Secondary | ICD-10-CM | POA: Diagnosis present

## 2013-02-06 DIAGNOSIS — M542 Cervicalgia: Secondary | ICD-10-CM | POA: Diagnosis not present

## 2013-02-06 DIAGNOSIS — R1310 Dysphagia, unspecified: Secondary | ICD-10-CM | POA: Diagnosis not present

## 2013-02-06 DIAGNOSIS — E039 Hypothyroidism, unspecified: Secondary | ICD-10-CM | POA: Diagnosis not present

## 2013-02-06 DIAGNOSIS — R509 Fever, unspecified: Secondary | ICD-10-CM | POA: Diagnosis not present

## 2013-02-06 DIAGNOSIS — Z79899 Other long term (current) drug therapy: Secondary | ICD-10-CM | POA: Diagnosis not present

## 2013-02-06 DIAGNOSIS — R0789 Other chest pain: Secondary | ICD-10-CM | POA: Diagnosis not present

## 2013-02-06 DIAGNOSIS — R269 Unspecified abnormalities of gait and mobility: Secondary | ICD-10-CM | POA: Diagnosis not present

## 2013-02-06 DIAGNOSIS — R197 Diarrhea, unspecified: Secondary | ICD-10-CM | POA: Diagnosis not present

## 2013-02-06 DIAGNOSIS — M25439 Effusion, unspecified wrist: Secondary | ICD-10-CM | POA: Diagnosis not present

## 2013-02-06 DIAGNOSIS — R489 Unspecified symbolic dysfunctions: Secondary | ICD-10-CM | POA: Diagnosis not present

## 2013-02-06 DIAGNOSIS — I2510 Atherosclerotic heart disease of native coronary artery without angina pectoris: Secondary | ICD-10-CM | POA: Diagnosis not present

## 2013-02-06 DIAGNOSIS — R279 Unspecified lack of coordination: Secondary | ICD-10-CM | POA: Diagnosis not present

## 2013-02-06 DIAGNOSIS — E7800 Pure hypercholesterolemia, unspecified: Secondary | ICD-10-CM | POA: Diagnosis not present

## 2013-02-06 DIAGNOSIS — M6281 Muscle weakness (generalized): Secondary | ICD-10-CM | POA: Diagnosis not present

## 2013-02-06 DIAGNOSIS — Z951 Presence of aortocoronary bypass graft: Secondary | ICD-10-CM | POA: Diagnosis not present

## 2013-02-06 DIAGNOSIS — R1011 Right upper quadrant pain: Secondary | ICD-10-CM | POA: Diagnosis not present

## 2013-02-06 HISTORY — DX: Traumatic subdural hemorrhage with loss of consciousness of unspecified duration, initial encounter: S06.5X9A

## 2013-02-06 HISTORY — DX: Traumatic subdural hemorrhage with loss of consciousness status unknown, initial encounter: S06.5XAA

## 2013-02-11 DIAGNOSIS — Z9181 History of falling: Secondary | ICD-10-CM | POA: Diagnosis not present

## 2013-02-11 DIAGNOSIS — I609 Nontraumatic subarachnoid hemorrhage, unspecified: Secondary | ICD-10-CM | POA: Diagnosis not present

## 2013-02-11 DIAGNOSIS — F411 Generalized anxiety disorder: Secondary | ICD-10-CM | POA: Diagnosis not present

## 2013-02-11 DIAGNOSIS — F22 Delusional disorders: Secondary | ICD-10-CM | POA: Diagnosis not present

## 2013-02-11 DIAGNOSIS — Z79899 Other long term (current) drug therapy: Secondary | ICD-10-CM | POA: Diagnosis not present

## 2013-02-11 DIAGNOSIS — N390 Urinary tract infection, site not specified: Secondary | ICD-10-CM | POA: Diagnosis not present

## 2013-02-11 DIAGNOSIS — E1159 Type 2 diabetes mellitus with other circulatory complications: Secondary | ICD-10-CM | POA: Diagnosis not present

## 2013-02-11 DIAGNOSIS — R269 Unspecified abnormalities of gait and mobility: Secondary | ICD-10-CM | POA: Diagnosis not present

## 2013-02-11 DIAGNOSIS — M6281 Muscle weakness (generalized): Secondary | ICD-10-CM | POA: Diagnosis not present

## 2013-02-11 DIAGNOSIS — R509 Fever, unspecified: Secondary | ICD-10-CM | POA: Diagnosis not present

## 2013-02-11 DIAGNOSIS — F29 Unspecified psychosis not due to a substance or known physiological condition: Secondary | ICD-10-CM | POA: Diagnosis not present

## 2013-02-11 DIAGNOSIS — S065X9A Traumatic subdural hemorrhage with loss of consciousness of unspecified duration, initial encounter: Secondary | ICD-10-CM | POA: Diagnosis not present

## 2013-02-11 DIAGNOSIS — R4182 Altered mental status, unspecified: Secondary | ICD-10-CM | POA: Diagnosis not present

## 2013-02-11 DIAGNOSIS — I2510 Atherosclerotic heart disease of native coronary artery without angina pectoris: Secondary | ICD-10-CM | POA: Diagnosis not present

## 2013-02-11 DIAGNOSIS — R279 Unspecified lack of coordination: Secondary | ICD-10-CM | POA: Diagnosis not present

## 2013-02-11 DIAGNOSIS — R569 Unspecified convulsions: Secondary | ICD-10-CM | POA: Diagnosis not present

## 2013-02-11 DIAGNOSIS — E039 Hypothyroidism, unspecified: Secondary | ICD-10-CM | POA: Diagnosis not present

## 2013-02-11 DIAGNOSIS — R6889 Other general symptoms and signs: Secondary | ICD-10-CM | POA: Diagnosis not present

## 2013-02-11 DIAGNOSIS — R489 Unspecified symbolic dysfunctions: Secondary | ICD-10-CM | POA: Diagnosis not present

## 2013-02-11 DIAGNOSIS — Z5189 Encounter for other specified aftercare: Secondary | ICD-10-CM | POA: Diagnosis not present

## 2013-02-11 DIAGNOSIS — E785 Hyperlipidemia, unspecified: Secondary | ICD-10-CM | POA: Diagnosis not present

## 2013-02-11 DIAGNOSIS — R1310 Dysphagia, unspecified: Secondary | ICD-10-CM | POA: Diagnosis not present

## 2013-02-11 DIAGNOSIS — I10 Essential (primary) hypertension: Secondary | ICD-10-CM | POA: Diagnosis not present

## 2013-02-11 DIAGNOSIS — N039 Chronic nephritic syndrome with unspecified morphologic changes: Secondary | ICD-10-CM | POA: Diagnosis not present

## 2013-02-11 DIAGNOSIS — Z7901 Long term (current) use of anticoagulants: Secondary | ICD-10-CM | POA: Diagnosis not present

## 2013-02-11 DIAGNOSIS — F0391 Unspecified dementia with behavioral disturbance: Secondary | ICD-10-CM | POA: Diagnosis not present

## 2013-02-11 DIAGNOSIS — Z951 Presence of aortocoronary bypass graft: Secondary | ICD-10-CM | POA: Diagnosis not present

## 2013-02-11 DIAGNOSIS — I6200 Nontraumatic subdural hemorrhage, unspecified: Secondary | ICD-10-CM | POA: Diagnosis not present

## 2013-02-11 DIAGNOSIS — E119 Type 2 diabetes mellitus without complications: Secondary | ICD-10-CM | POA: Diagnosis not present

## 2013-02-13 DIAGNOSIS — E1159 Type 2 diabetes mellitus with other circulatory complications: Secondary | ICD-10-CM | POA: Diagnosis not present

## 2013-02-13 DIAGNOSIS — S065X9A Traumatic subdural hemorrhage with loss of consciousness of unspecified duration, initial encounter: Secondary | ICD-10-CM | POA: Diagnosis not present

## 2013-02-13 DIAGNOSIS — I2510 Atherosclerotic heart disease of native coronary artery without angina pectoris: Secondary | ICD-10-CM | POA: Diagnosis not present

## 2013-02-13 DIAGNOSIS — E039 Hypothyroidism, unspecified: Secondary | ICD-10-CM | POA: Diagnosis not present

## 2013-02-24 DIAGNOSIS — F0391 Unspecified dementia with behavioral disturbance: Secondary | ICD-10-CM | POA: Diagnosis not present

## 2013-02-26 DIAGNOSIS — I609 Nontraumatic subarachnoid hemorrhage, unspecified: Secondary | ICD-10-CM | POA: Diagnosis not present

## 2013-02-26 DIAGNOSIS — R4182 Altered mental status, unspecified: Secondary | ICD-10-CM | POA: Diagnosis not present

## 2013-02-26 DIAGNOSIS — F411 Generalized anxiety disorder: Secondary | ICD-10-CM | POA: Diagnosis not present

## 2013-02-27 DIAGNOSIS — F0391 Unspecified dementia with behavioral disturbance: Secondary | ICD-10-CM | POA: Diagnosis not present

## 2013-03-02 DIAGNOSIS — S065X9A Traumatic subdural hemorrhage with loss of consciousness of unspecified duration, initial encounter: Secondary | ICD-10-CM | POA: Diagnosis not present

## 2013-03-03 DIAGNOSIS — I6200 Nontraumatic subdural hemorrhage, unspecified: Secondary | ICD-10-CM | POA: Diagnosis not present

## 2013-03-26 DIAGNOSIS — F0390 Unspecified dementia without behavioral disturbance: Secondary | ICD-10-CM | POA: Diagnosis not present

## 2013-03-26 DIAGNOSIS — E1159 Type 2 diabetes mellitus with other circulatory complications: Secondary | ICD-10-CM | POA: Diagnosis not present

## 2013-03-26 DIAGNOSIS — E785 Hyperlipidemia, unspecified: Secondary | ICD-10-CM | POA: Diagnosis not present

## 2013-03-26 DIAGNOSIS — I130 Hypertensive heart and chronic kidney disease with heart failure and stage 1 through stage 4 chronic kidney disease, or unspecified chronic kidney disease: Secondary | ICD-10-CM | POA: Diagnosis not present

## 2013-03-27 DIAGNOSIS — F29 Unspecified psychosis not due to a substance or known physiological condition: Secondary | ICD-10-CM | POA: Diagnosis not present

## 2013-04-03 DIAGNOSIS — S065X9A Traumatic subdural hemorrhage with loss of consciousness of unspecified duration, initial encounter: Secondary | ICD-10-CM | POA: Diagnosis not present

## 2013-04-08 DIAGNOSIS — I6200 Nontraumatic subdural hemorrhage, unspecified: Secondary | ICD-10-CM | POA: Diagnosis not present

## 2013-05-01 DIAGNOSIS — R413 Other amnesia: Secondary | ICD-10-CM | POA: Diagnosis not present

## 2013-05-01 DIAGNOSIS — I10 Essential (primary) hypertension: Secondary | ICD-10-CM | POA: Diagnosis not present

## 2013-05-01 DIAGNOSIS — I2510 Atherosclerotic heart disease of native coronary artery without angina pectoris: Secondary | ICD-10-CM | POA: Diagnosis not present

## 2013-05-01 DIAGNOSIS — R269 Unspecified abnormalities of gait and mobility: Secondary | ICD-10-CM | POA: Diagnosis not present

## 2013-05-01 DIAGNOSIS — F329 Major depressive disorder, single episode, unspecified: Secondary | ICD-10-CM | POA: Diagnosis not present

## 2013-05-01 DIAGNOSIS — M6281 Muscle weakness (generalized): Secondary | ICD-10-CM | POA: Diagnosis not present

## 2013-05-01 DIAGNOSIS — E785 Hyperlipidemia, unspecified: Secondary | ICD-10-CM | POA: Diagnosis not present

## 2013-05-01 DIAGNOSIS — F0281 Dementia in other diseases classified elsewhere with behavioral disturbance: Secondary | ICD-10-CM | POA: Diagnosis not present

## 2013-05-01 DIAGNOSIS — R279 Unspecified lack of coordination: Secondary | ICD-10-CM | POA: Diagnosis not present

## 2013-05-03 DIAGNOSIS — E785 Hyperlipidemia, unspecified: Secondary | ICD-10-CM | POA: Diagnosis not present

## 2013-05-03 DIAGNOSIS — M6281 Muscle weakness (generalized): Secondary | ICD-10-CM | POA: Diagnosis not present

## 2013-05-03 DIAGNOSIS — F0281 Dementia in other diseases classified elsewhere with behavioral disturbance: Secondary | ICD-10-CM | POA: Diagnosis not present

## 2013-05-03 DIAGNOSIS — I10 Essential (primary) hypertension: Secondary | ICD-10-CM | POA: Diagnosis not present

## 2013-05-03 DIAGNOSIS — F329 Major depressive disorder, single episode, unspecified: Secondary | ICD-10-CM | POA: Diagnosis not present

## 2013-05-03 DIAGNOSIS — I2510 Atherosclerotic heart disease of native coronary artery without angina pectoris: Secondary | ICD-10-CM | POA: Diagnosis not present

## 2013-05-04 DIAGNOSIS — F0281 Dementia in other diseases classified elsewhere with behavioral disturbance: Secondary | ICD-10-CM | POA: Diagnosis not present

## 2013-05-04 DIAGNOSIS — E785 Hyperlipidemia, unspecified: Secondary | ICD-10-CM | POA: Diagnosis not present

## 2013-05-04 DIAGNOSIS — F329 Major depressive disorder, single episode, unspecified: Secondary | ICD-10-CM | POA: Diagnosis not present

## 2013-05-04 DIAGNOSIS — I10 Essential (primary) hypertension: Secondary | ICD-10-CM | POA: Diagnosis not present

## 2013-05-04 DIAGNOSIS — I2510 Atherosclerotic heart disease of native coronary artery without angina pectoris: Secondary | ICD-10-CM | POA: Diagnosis not present

## 2013-05-04 DIAGNOSIS — M6281 Muscle weakness (generalized): Secondary | ICD-10-CM | POA: Diagnosis not present

## 2013-05-05 DIAGNOSIS — I259 Chronic ischemic heart disease, unspecified: Secondary | ICD-10-CM | POA: Diagnosis not present

## 2013-05-05 DIAGNOSIS — I2510 Atherosclerotic heart disease of native coronary artery without angina pectoris: Secondary | ICD-10-CM | POA: Diagnosis not present

## 2013-05-05 DIAGNOSIS — F0281 Dementia in other diseases classified elsewhere with behavioral disturbance: Secondary | ICD-10-CM | POA: Diagnosis not present

## 2013-05-05 DIAGNOSIS — M6281 Muscle weakness (generalized): Secondary | ICD-10-CM | POA: Diagnosis not present

## 2013-05-05 DIAGNOSIS — E785 Hyperlipidemia, unspecified: Secondary | ICD-10-CM | POA: Diagnosis not present

## 2013-05-05 DIAGNOSIS — F329 Major depressive disorder, single episode, unspecified: Secondary | ICD-10-CM | POA: Diagnosis not present

## 2013-05-05 DIAGNOSIS — E119 Type 2 diabetes mellitus without complications: Secondary | ICD-10-CM | POA: Diagnosis not present

## 2013-05-05 DIAGNOSIS — I10 Essential (primary) hypertension: Secondary | ICD-10-CM | POA: Diagnosis not present

## 2013-05-06 DIAGNOSIS — R279 Unspecified lack of coordination: Secondary | ICD-10-CM | POA: Diagnosis not present

## 2013-05-06 DIAGNOSIS — F0281 Dementia in other diseases classified elsewhere with behavioral disturbance: Secondary | ICD-10-CM | POA: Diagnosis not present

## 2013-05-06 DIAGNOSIS — I2510 Atherosclerotic heart disease of native coronary artery without angina pectoris: Secondary | ICD-10-CM | POA: Diagnosis not present

## 2013-05-06 DIAGNOSIS — M6281 Muscle weakness (generalized): Secondary | ICD-10-CM | POA: Diagnosis not present

## 2013-05-06 DIAGNOSIS — E785 Hyperlipidemia, unspecified: Secondary | ICD-10-CM | POA: Diagnosis not present

## 2013-05-06 DIAGNOSIS — F329 Major depressive disorder, single episode, unspecified: Secondary | ICD-10-CM | POA: Diagnosis not present

## 2013-05-06 DIAGNOSIS — I10 Essential (primary) hypertension: Secondary | ICD-10-CM | POA: Diagnosis not present

## 2013-05-06 DIAGNOSIS — R269 Unspecified abnormalities of gait and mobility: Secondary | ICD-10-CM | POA: Diagnosis not present

## 2013-05-07 DIAGNOSIS — F0281 Dementia in other diseases classified elsewhere with behavioral disturbance: Secondary | ICD-10-CM | POA: Diagnosis not present

## 2013-05-07 DIAGNOSIS — I10 Essential (primary) hypertension: Secondary | ICD-10-CM | POA: Diagnosis not present

## 2013-05-07 DIAGNOSIS — M6281 Muscle weakness (generalized): Secondary | ICD-10-CM | POA: Diagnosis not present

## 2013-05-07 DIAGNOSIS — F329 Major depressive disorder, single episode, unspecified: Secondary | ICD-10-CM | POA: Diagnosis not present

## 2013-05-07 DIAGNOSIS — I2510 Atherosclerotic heart disease of native coronary artery without angina pectoris: Secondary | ICD-10-CM | POA: Diagnosis not present

## 2013-05-07 DIAGNOSIS — E785 Hyperlipidemia, unspecified: Secondary | ICD-10-CM | POA: Diagnosis not present

## 2013-05-08 DIAGNOSIS — M6281 Muscle weakness (generalized): Secondary | ICD-10-CM | POA: Diagnosis not present

## 2013-05-08 DIAGNOSIS — E785 Hyperlipidemia, unspecified: Secondary | ICD-10-CM | POA: Diagnosis not present

## 2013-05-08 DIAGNOSIS — F329 Major depressive disorder, single episode, unspecified: Secondary | ICD-10-CM | POA: Diagnosis not present

## 2013-05-08 DIAGNOSIS — F0281 Dementia in other diseases classified elsewhere with behavioral disturbance: Secondary | ICD-10-CM | POA: Diagnosis not present

## 2013-05-08 DIAGNOSIS — I2510 Atherosclerotic heart disease of native coronary artery without angina pectoris: Secondary | ICD-10-CM | POA: Diagnosis not present

## 2013-05-08 DIAGNOSIS — I10 Essential (primary) hypertension: Secondary | ICD-10-CM | POA: Diagnosis not present

## 2013-05-10 DIAGNOSIS — I2510 Atherosclerotic heart disease of native coronary artery without angina pectoris: Secondary | ICD-10-CM | POA: Diagnosis not present

## 2013-05-10 DIAGNOSIS — F329 Major depressive disorder, single episode, unspecified: Secondary | ICD-10-CM | POA: Diagnosis not present

## 2013-05-10 DIAGNOSIS — E785 Hyperlipidemia, unspecified: Secondary | ICD-10-CM | POA: Diagnosis not present

## 2013-05-10 DIAGNOSIS — I10 Essential (primary) hypertension: Secondary | ICD-10-CM | POA: Diagnosis not present

## 2013-05-10 DIAGNOSIS — M6281 Muscle weakness (generalized): Secondary | ICD-10-CM | POA: Diagnosis not present

## 2013-05-10 DIAGNOSIS — F0281 Dementia in other diseases classified elsewhere with behavioral disturbance: Secondary | ICD-10-CM | POA: Diagnosis not present

## 2013-05-11 DIAGNOSIS — F0281 Dementia in other diseases classified elsewhere with behavioral disturbance: Secondary | ICD-10-CM | POA: Diagnosis not present

## 2013-05-11 DIAGNOSIS — M6281 Muscle weakness (generalized): Secondary | ICD-10-CM | POA: Diagnosis not present

## 2013-05-11 DIAGNOSIS — I2510 Atherosclerotic heart disease of native coronary artery without angina pectoris: Secondary | ICD-10-CM | POA: Diagnosis not present

## 2013-05-11 DIAGNOSIS — E785 Hyperlipidemia, unspecified: Secondary | ICD-10-CM | POA: Diagnosis not present

## 2013-05-11 DIAGNOSIS — F329 Major depressive disorder, single episode, unspecified: Secondary | ICD-10-CM | POA: Diagnosis not present

## 2013-05-11 DIAGNOSIS — I10 Essential (primary) hypertension: Secondary | ICD-10-CM | POA: Diagnosis not present

## 2013-05-12 DIAGNOSIS — F0281 Dementia in other diseases classified elsewhere with behavioral disturbance: Secondary | ICD-10-CM | POA: Diagnosis not present

## 2013-05-12 DIAGNOSIS — M6281 Muscle weakness (generalized): Secondary | ICD-10-CM | POA: Diagnosis not present

## 2013-05-12 DIAGNOSIS — E785 Hyperlipidemia, unspecified: Secondary | ICD-10-CM | POA: Diagnosis not present

## 2013-05-12 DIAGNOSIS — I2510 Atherosclerotic heart disease of native coronary artery without angina pectoris: Secondary | ICD-10-CM | POA: Diagnosis not present

## 2013-05-12 DIAGNOSIS — I10 Essential (primary) hypertension: Secondary | ICD-10-CM | POA: Diagnosis not present

## 2013-05-12 DIAGNOSIS — F329 Major depressive disorder, single episode, unspecified: Secondary | ICD-10-CM | POA: Diagnosis not present

## 2013-05-13 DIAGNOSIS — I10 Essential (primary) hypertension: Secondary | ICD-10-CM | POA: Diagnosis not present

## 2013-05-13 DIAGNOSIS — I2510 Atherosclerotic heart disease of native coronary artery without angina pectoris: Secondary | ICD-10-CM | POA: Diagnosis not present

## 2013-05-13 DIAGNOSIS — F329 Major depressive disorder, single episode, unspecified: Secondary | ICD-10-CM | POA: Diagnosis not present

## 2013-05-13 DIAGNOSIS — E785 Hyperlipidemia, unspecified: Secondary | ICD-10-CM | POA: Diagnosis not present

## 2013-05-13 DIAGNOSIS — F0281 Dementia in other diseases classified elsewhere with behavioral disturbance: Secondary | ICD-10-CM | POA: Diagnosis not present

## 2013-05-13 DIAGNOSIS — M6281 Muscle weakness (generalized): Secondary | ICD-10-CM | POA: Diagnosis not present

## 2013-05-14 DIAGNOSIS — I2510 Atherosclerotic heart disease of native coronary artery without angina pectoris: Secondary | ICD-10-CM | POA: Diagnosis not present

## 2013-05-14 DIAGNOSIS — I10 Essential (primary) hypertension: Secondary | ICD-10-CM | POA: Diagnosis not present

## 2013-05-14 DIAGNOSIS — F0281 Dementia in other diseases classified elsewhere with behavioral disturbance: Secondary | ICD-10-CM | POA: Diagnosis not present

## 2013-05-14 DIAGNOSIS — E785 Hyperlipidemia, unspecified: Secondary | ICD-10-CM | POA: Diagnosis not present

## 2013-05-14 DIAGNOSIS — M6281 Muscle weakness (generalized): Secondary | ICD-10-CM | POA: Diagnosis not present

## 2013-05-14 DIAGNOSIS — F329 Major depressive disorder, single episode, unspecified: Secondary | ICD-10-CM | POA: Diagnosis not present

## 2013-05-15 DIAGNOSIS — I2510 Atherosclerotic heart disease of native coronary artery without angina pectoris: Secondary | ICD-10-CM | POA: Diagnosis not present

## 2013-05-15 DIAGNOSIS — M6281 Muscle weakness (generalized): Secondary | ICD-10-CM | POA: Diagnosis not present

## 2013-05-15 DIAGNOSIS — F0281 Dementia in other diseases classified elsewhere with behavioral disturbance: Secondary | ICD-10-CM | POA: Diagnosis not present

## 2013-05-15 DIAGNOSIS — E785 Hyperlipidemia, unspecified: Secondary | ICD-10-CM | POA: Diagnosis not present

## 2013-05-15 DIAGNOSIS — F329 Major depressive disorder, single episode, unspecified: Secondary | ICD-10-CM | POA: Diagnosis not present

## 2013-05-15 DIAGNOSIS — I10 Essential (primary) hypertension: Secondary | ICD-10-CM | POA: Diagnosis not present

## 2013-05-17 DIAGNOSIS — F329 Major depressive disorder, single episode, unspecified: Secondary | ICD-10-CM | POA: Diagnosis not present

## 2013-05-17 DIAGNOSIS — I10 Essential (primary) hypertension: Secondary | ICD-10-CM | POA: Diagnosis not present

## 2013-05-17 DIAGNOSIS — E785 Hyperlipidemia, unspecified: Secondary | ICD-10-CM | POA: Diagnosis not present

## 2013-05-17 DIAGNOSIS — M6281 Muscle weakness (generalized): Secondary | ICD-10-CM | POA: Diagnosis not present

## 2013-05-17 DIAGNOSIS — I2510 Atherosclerotic heart disease of native coronary artery without angina pectoris: Secondary | ICD-10-CM | POA: Diagnosis not present

## 2013-05-17 DIAGNOSIS — F0281 Dementia in other diseases classified elsewhere with behavioral disturbance: Secondary | ICD-10-CM | POA: Diagnosis not present

## 2013-05-18 DIAGNOSIS — F0281 Dementia in other diseases classified elsewhere with behavioral disturbance: Secondary | ICD-10-CM | POA: Diagnosis not present

## 2013-05-18 DIAGNOSIS — I10 Essential (primary) hypertension: Secondary | ICD-10-CM | POA: Diagnosis not present

## 2013-05-18 DIAGNOSIS — F329 Major depressive disorder, single episode, unspecified: Secondary | ICD-10-CM | POA: Diagnosis not present

## 2013-05-18 DIAGNOSIS — I2510 Atherosclerotic heart disease of native coronary artery without angina pectoris: Secondary | ICD-10-CM | POA: Diagnosis not present

## 2013-05-18 DIAGNOSIS — M6281 Muscle weakness (generalized): Secondary | ICD-10-CM | POA: Diagnosis not present

## 2013-05-18 DIAGNOSIS — E785 Hyperlipidemia, unspecified: Secondary | ICD-10-CM | POA: Diagnosis not present

## 2013-05-19 DIAGNOSIS — I10 Essential (primary) hypertension: Secondary | ICD-10-CM | POA: Diagnosis not present

## 2013-05-19 DIAGNOSIS — M6281 Muscle weakness (generalized): Secondary | ICD-10-CM | POA: Diagnosis not present

## 2013-05-19 DIAGNOSIS — F329 Major depressive disorder, single episode, unspecified: Secondary | ICD-10-CM | POA: Diagnosis not present

## 2013-05-19 DIAGNOSIS — F0281 Dementia in other diseases classified elsewhere with behavioral disturbance: Secondary | ICD-10-CM | POA: Diagnosis not present

## 2013-05-19 DIAGNOSIS — E785 Hyperlipidemia, unspecified: Secondary | ICD-10-CM | POA: Diagnosis not present

## 2013-05-19 DIAGNOSIS — I2510 Atherosclerotic heart disease of native coronary artery without angina pectoris: Secondary | ICD-10-CM | POA: Diagnosis not present

## 2013-05-20 DIAGNOSIS — F329 Major depressive disorder, single episode, unspecified: Secondary | ICD-10-CM | POA: Diagnosis not present

## 2013-05-20 DIAGNOSIS — M6281 Muscle weakness (generalized): Secondary | ICD-10-CM | POA: Diagnosis not present

## 2013-05-20 DIAGNOSIS — I2510 Atherosclerotic heart disease of native coronary artery without angina pectoris: Secondary | ICD-10-CM | POA: Diagnosis not present

## 2013-05-20 DIAGNOSIS — I10 Essential (primary) hypertension: Secondary | ICD-10-CM | POA: Diagnosis not present

## 2013-05-20 DIAGNOSIS — E785 Hyperlipidemia, unspecified: Secondary | ICD-10-CM | POA: Diagnosis not present

## 2013-05-20 DIAGNOSIS — F0281 Dementia in other diseases classified elsewhere with behavioral disturbance: Secondary | ICD-10-CM | POA: Diagnosis not present

## 2013-05-21 DIAGNOSIS — M6281 Muscle weakness (generalized): Secondary | ICD-10-CM | POA: Diagnosis not present

## 2013-05-21 DIAGNOSIS — F0281 Dementia in other diseases classified elsewhere with behavioral disturbance: Secondary | ICD-10-CM | POA: Diagnosis not present

## 2013-05-21 DIAGNOSIS — E785 Hyperlipidemia, unspecified: Secondary | ICD-10-CM | POA: Diagnosis not present

## 2013-05-21 DIAGNOSIS — I2510 Atherosclerotic heart disease of native coronary artery without angina pectoris: Secondary | ICD-10-CM | POA: Diagnosis not present

## 2013-05-21 DIAGNOSIS — I10 Essential (primary) hypertension: Secondary | ICD-10-CM | POA: Diagnosis not present

## 2013-05-21 DIAGNOSIS — F329 Major depressive disorder, single episode, unspecified: Secondary | ICD-10-CM | POA: Diagnosis not present

## 2013-05-22 DIAGNOSIS — F329 Major depressive disorder, single episode, unspecified: Secondary | ICD-10-CM | POA: Diagnosis not present

## 2013-05-22 DIAGNOSIS — M6281 Muscle weakness (generalized): Secondary | ICD-10-CM | POA: Diagnosis not present

## 2013-05-22 DIAGNOSIS — I2510 Atherosclerotic heart disease of native coronary artery without angina pectoris: Secondary | ICD-10-CM | POA: Diagnosis not present

## 2013-05-22 DIAGNOSIS — E785 Hyperlipidemia, unspecified: Secondary | ICD-10-CM | POA: Diagnosis not present

## 2013-05-22 DIAGNOSIS — F0281 Dementia in other diseases classified elsewhere with behavioral disturbance: Secondary | ICD-10-CM | POA: Diagnosis not present

## 2013-05-22 DIAGNOSIS — I10 Essential (primary) hypertension: Secondary | ICD-10-CM | POA: Diagnosis not present

## 2013-05-25 DIAGNOSIS — M6281 Muscle weakness (generalized): Secondary | ICD-10-CM | POA: Diagnosis not present

## 2013-05-25 DIAGNOSIS — I10 Essential (primary) hypertension: Secondary | ICD-10-CM | POA: Diagnosis not present

## 2013-05-25 DIAGNOSIS — E785 Hyperlipidemia, unspecified: Secondary | ICD-10-CM | POA: Diagnosis not present

## 2013-05-25 DIAGNOSIS — F0281 Dementia in other diseases classified elsewhere with behavioral disturbance: Secondary | ICD-10-CM | POA: Diagnosis not present

## 2013-05-25 DIAGNOSIS — F329 Major depressive disorder, single episode, unspecified: Secondary | ICD-10-CM | POA: Diagnosis not present

## 2013-05-25 DIAGNOSIS — I2510 Atherosclerotic heart disease of native coronary artery without angina pectoris: Secondary | ICD-10-CM | POA: Diagnosis not present

## 2013-06-06 DIAGNOSIS — I10 Essential (primary) hypertension: Secondary | ICD-10-CM | POA: Diagnosis not present

## 2013-06-06 DIAGNOSIS — I2510 Atherosclerotic heart disease of native coronary artery without angina pectoris: Secondary | ICD-10-CM | POA: Diagnosis not present

## 2013-06-06 DIAGNOSIS — E119 Type 2 diabetes mellitus without complications: Secondary | ICD-10-CM | POA: Diagnosis not present

## 2013-06-06 DIAGNOSIS — M545 Low back pain, unspecified: Secondary | ICD-10-CM | POA: Diagnosis not present

## 2013-06-08 DIAGNOSIS — M5137 Other intervertebral disc degeneration, lumbosacral region: Secondary | ICD-10-CM | POA: Diagnosis not present

## 2013-06-08 DIAGNOSIS — I6200 Nontraumatic subdural hemorrhage, unspecified: Secondary | ICD-10-CM | POA: Diagnosis not present

## 2013-06-08 DIAGNOSIS — M545 Low back pain, unspecified: Secondary | ICD-10-CM | POA: Diagnosis not present

## 2013-06-08 DIAGNOSIS — H6120 Impacted cerumen, unspecified ear: Secondary | ICD-10-CM | POA: Diagnosis not present

## 2013-06-08 DIAGNOSIS — I10 Essential (primary) hypertension: Secondary | ICD-10-CM | POA: Diagnosis not present

## 2013-06-08 DIAGNOSIS — E039 Hypothyroidism, unspecified: Secondary | ICD-10-CM | POA: Diagnosis not present

## 2013-06-27 DIAGNOSIS — N390 Urinary tract infection, site not specified: Secondary | ICD-10-CM | POA: Diagnosis not present

## 2013-06-27 DIAGNOSIS — R32 Unspecified urinary incontinence: Secondary | ICD-10-CM | POA: Diagnosis not present

## 2013-07-06 ENCOUNTER — Ambulatory Visit: Payer: Medicare Other | Admitting: Cardiovascular Disease

## 2013-07-08 ENCOUNTER — Encounter: Payer: Self-pay | Admitting: Cardiovascular Disease

## 2013-07-08 ENCOUNTER — Ambulatory Visit (INDEPENDENT_AMBULATORY_CARE_PROVIDER_SITE_OTHER): Payer: Medicare Other | Admitting: Cardiovascular Disease

## 2013-07-08 VITALS — BP 190/100 | Ht 68.0 in | Wt 215.2 lb

## 2013-07-08 DIAGNOSIS — E782 Mixed hyperlipidemia: Secondary | ICD-10-CM

## 2013-07-08 DIAGNOSIS — I2510 Atherosclerotic heart disease of native coronary artery without angina pectoris: Secondary | ICD-10-CM

## 2013-07-08 DIAGNOSIS — I4949 Other premature depolarization: Secondary | ICD-10-CM | POA: Diagnosis not present

## 2013-07-08 DIAGNOSIS — E119 Type 2 diabetes mellitus without complications: Secondary | ICD-10-CM

## 2013-07-08 DIAGNOSIS — S0990XA Unspecified injury of head, initial encounter: Secondary | ICD-10-CM

## 2013-07-08 DIAGNOSIS — E039 Hypothyroidism, unspecified: Secondary | ICD-10-CM

## 2013-07-08 DIAGNOSIS — R600 Localized edema: Secondary | ICD-10-CM

## 2013-07-08 DIAGNOSIS — I10 Essential (primary) hypertension: Secondary | ICD-10-CM

## 2013-07-08 DIAGNOSIS — I493 Ventricular premature depolarization: Secondary | ICD-10-CM

## 2013-07-08 DIAGNOSIS — R609 Edema, unspecified: Secondary | ICD-10-CM

## 2013-07-08 MED ORDER — TORSEMIDE 20 MG PO TABS: 20.0000 mg | ORAL_TABLET | Freq: Two times a day (BID) | ORAL | Status: DC

## 2013-07-08 NOTE — Patient Instructions (Signed)
Your physician recommends that you schedule a follow-up appointment in: 6 weeks  Your physician recommends that you return for lab work CMP, CBC, A1C, LIPIDS, [place] has recommended you make the following change in your medication: Stop Furosemide and Start taking an Aspirin 81 mg and start Torsemide 20 mg take 2 tablets in the morning for 3 days then 1 tablets twice a day

## 2013-07-08 NOTE — Progress Notes (Addendum)
Patient ID: Roberto Haley, male   DOB: August 25, 1940, 72 y.o.   MRN: [id number]   HPI: Roberto Haley, is a 72 y.o. male who presents to the office for six-month cardiology followup evaluation. Since I last saw him in May 2014, he apparently had fallen and developed "blood on the brain" and was hospitalized at [facility] for a week, and then spent 3 months in rehabilitation.  Patient has known coronary artery disease and underwent CABG revascularization surgery in 1997 with LIMA to LAD, vein to the right artery. In March 1998 he underwent complex intervention to his native LAD and HiSpeed rotational atherectomy and stenting of his LAD and PTCA of his diagonal vessel after he was found to have stenosis in the LIMA graft. In June 2013 due to 99% in-stent restenosis in the LAD stent and a new stenosis of 90% the distal circumflex, he underwent two-vessel intervention with insertion of a 2.75x22 mm resident stent in the LAD and PTCA of the distal circumflex via his previously placed stent which started in the mid left main coronary artery. A cardiopulmonary met test did show a reduced maximum oxygen consumption. I did reduce his beta blocker therapy due to significant chronotropic incompetence, and further titrate his Ranexa to 1000 mg twice a day.  He did undergo a nuclear perfusion study in June 2014 which showed distal anteroseptal scar without significant ischemia. He did have PACs and PVCs in gating was not done.  He apparently fell on the Fourth of July and landed on his head. He states he initially was seen at urgent care, transferred to [facility], and then transferred to [facility] where was hospitalized for a week. He did not have neurosurgery. 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 He then spent 2 months at [facility] rehabilitation unit and has spent additional time a [facility] in [place] until October 31.  He denies recent chest pain. He continues to experience  significant leg swelling. He does have memory issues. He has regained his motor strength. He presents for evaluation.   Past Medical History  Diagnosis Date  . Coronary artery disease   . Diabetes mellitus   . Groin hematoma 02/19/12    RIGHT  . Hyperlipidemia   . Hypertension   . Obesity   . PVC (premature ventricular contraction)   . Chronic kidney disease     CKD STAGE 3;  CHRONIC RENAL INSUFFICIENCY    Past Surgical History  Procedure Laterality Date  . Coronary artery bypass graft  1997    LIMA to the LAD and vein to the RCA;  . Cardiac catheterization  01/14/12    LV FXN EF 50-55% W/MILD DISTAL INFERIOR HYPOKINESIS; PCI: LAD PTCA/STENT W/NEW 2.75X22 RESOLUTE DES IN MID LAD POST DIALTED TO 3.08 TO 2.97 TAPER: 99%-80% TO 0; LCX: PTCA VIA LM STENT W/2.25X12 SPRINTER BALLOON: 90%-5; LAD: PATENT PROXIMAL STENT EXTENDING INTO LM W/30-40% SMOOTH NARROWING IN DISTAL PORTION OF STENT; 99% IN STENT RESTENOSIS OF MID LAD STENT   . Lower venous duplex  02/05/12    ESSENTIALLY NORMAL RIGHT LOWER EXTRIMTY VENOUS DUPLEX DOPPLER EVALUATION  . Lower arterial doppler  01/24/12    ESSENTIALLY NORMAL RIGHT GROIN DUPLEX EVALUATION S/P THROMBIN INJECTION  . Nm myoview ltd  01/09/12    HIGH RISK SCAN. COMPARED TO PREVIOUS STUDY, THERE IS NOW ISCHEMIA PRESENT. ABNORMAL MYOCARDIAL PERFUSION STUDY. THERE IS NEW MILD INFEROLATERAL ISCHEMIA TOWARDS THE BASE; PT TO FOLLOW UP WITH DR. [name]  . Doppler  echocardiography  01/09/12    LV NORMAL IN SIZE, NORMAL WAL THICKNESS, EF 50-55%; MILD POSTERIOR WALL HYOPKINESIS, THERE IS MILD INFERIOR WALL HYPOKINESIS    Allergies  Allergen Reactions  . Tramadol Nausea And Vomiting  . Codeine Rash    Current Outpatient Prescriptions  Medication Sig Dispense Refill  . aspirin 81 MG tablet Take 81 mg by mouth daily.      . benazepril (LOTENSIN) 40 MG tablet Take 40 mg by mouth daily.      . cholecalciferol (VITAMIN D) 1000 UNITS tablet Take 1,000 Units by mouth daily.      .  citalopram (CELEXA) 10 MG tablet Take 1 tablet by mouth daily.      . fluticasone (FLONASE) 50 MCG/ACT nasal spray 2 sprays daily.      . furosemide (LASIX) 20 MG tablet Take 1 tablet by mouth every other day.       . isosorbide mononitrate (IMDUR) 30 MG 24 hr tablet Take 30 mg by mouth daily.      . metoprolol succinate (TOPROL-XL) 100 MG 24 hr tablet Take 100 mg by mouth daily.      . Multiple Vitamin (MULTIVITAMIN WITH MINERALS) TABS Take 1 tablet by mouth daily.      Marland Kitchen NAMENDA 10 MG tablet Take 1 tablet by mouth daily.      Marland Kitchen NOVOLIN R 100 UNIT/ML injection Inject 5 Units into the skin at bedtime.      Marland Kitchen oxybutynin (DITROPAN) 5 MG tablet Take 1 tablet by mouth daily.      . polyethylene glycol powder (GLYCOLAX/MIRALAX) powder       . pravastatin (PRAVACHOL) 40 MG tablet Take 40 mg by mouth daily.      . QUEtiapine (SEROQUEL) 25 MG tablet Take 1.5 tablets by mouth daily.      . ranitidine (ZANTAC) 150 MG tablet Take 150 mg by mouth 2 (two) times daily.      . sitaGLIPtin (JANUVIA) 100 MG tablet Take 100 mg by mouth daily.      Marland Kitchen SYNTHROID 75 MCG tablet 1 tablet daily.      . VENTOLIN HFA 108 (90 BASE) MCG/ACT inhaler Inhale 1 puff into the lungs as needed.      . vitamin C (ASCORBIC ACID) 500 MG tablet Take 500 mg by mouth at bedtime and may repeat dose one time if needed.      . nitroGLYCERIN (NITROSTAT) 0.4 MG SL tablet Place 0.4 mg under the tongue every 5 (five) minutes as needed. For chest pain       No current facility-administered medications for this visit.    Socially, he is married has one child 2 grandchildren and 2 great-grandchildren. There is no tobacco or alcohol use.  ROV is negative for fever chills night sweats. He denies skin rash. He denies visual changes. He denies palpitations. He does note shortness of breath with activity.  He denies recent chest pain or angina. At times he has some mild constipation. Apparently he had leg swelling and was taking Lasix 40 mg but  recently has been taking 20 mg every other day. He has had significant leg swelling recur. He denies recent headache.. He denies paresthesias. He is diabetic per he has a history of hyperlipidemia. He also has a history of hypothyroidism on Synthroid replacement. Other comprehensive 12 point systems review is negative.  PE BP 190/100  Ht 5\' 8"  (1.727 m)  Wt 215 lb 3.2 oz (97.614 kg)  BMI 32.73 kg/m2  Repeat blood pressure was 148/88 General: Alert, oriented, no distress.  HEENT: Normocephalic, atraumatic. Pupils round and reactive; sclera anicteric; F Nose without nasal septal hypertrophy Mouth/Parynx benign; Mallinpatti scale 3 Neck: No JVD, no carotid briuts Lungs: clear to ausculatation and percussion; no wheezing or rales Heart: RRR, s1 s2 normal 2/6 sem, unchanged Abdomen: soft, nontender; no hepatosplenomehaly, BS+; abdominal aorta nontender and not dilated by palpation. Mild central adiposity Pulses 2+ Extremities: 2-3+ tense edema bilaterally  in his lower extremities extending up to his knees appear no clubbinbg cyanosis, Homan's sign negative  Neurologic: grossly nonfocal Psychologic: Normal affect and mood    ECG reveals sinus rhythm at 57 beats per minute. QTc interval 453 ms.  LABS:  BMET    Component Value Date/Time   NA 138 01/18/2012 1500   K 3.7 01/18/2012 1500   CL 100 01/18/2012 1500   CO2 28 01/18/2012 1500   GLUCOSE 223* 01/18/2012 1500   BUN 23 01/18/2012 1500   CREATININE 1.22 01/18/2012 1500   CALCIUM 9.1 01/18/2012 1500   GFRNONAA 59* 01/18/2012 1500   GFRAA 68* 01/18/2012 1500     Hepatic Function Panel  No results found for this basename: prot,  albumin,  ast,  alt,  alkphos,  bilitot,  bilidir,  ibili     CBC    Component Value Date/Time   WBC 9.1 01/19/2012 0530   RBC 3.41* 01/19/2012 0530   HGB 10.1* 01/19/2012 0530   HCT 30.1* 01/19/2012 0530   PLT 190 01/19/2012 0530   MCV 88.3 01/19/2012 0530   MCH 29.6 01/19/2012 0530   MCHC 33.6 01/19/2012  0530   RDW 14.0 01/19/2012 0530     BNP No results found for this basename: probnp    Lipid Panel  No results found for this basename: chol,  trig,  hdl,  cholhdl,  vldl,  ldlcalc     RADIOLOGY: No results found.    ASSESSMENT AND PLAN:   Mr. Fagnani is now 18 years status post CABG revascularization surgery as has a documented occluded LIMA. He previously undergone intervention both to his proximal LAD extending into the left main with stenting and stenting of his mid LAD. In June 2013 he developed severe in-stent restenosis of his LAD stent and a DES stent was in place at that time. We also went through the stent struts in the left main and a PTCA of the distal circumflex. He did have a mild/moderate disease in his PDA after his SVG to his PDA insertion. He did undergo a nuclear perfusion study in June after experiencing some vague episodes of chest pain. This was considered low risk and showed distal anteroseptal scar with the extent of 10% without associated ischemia. He did sustain trauma leading to injury of his brain. I am not certain if he had a subarachnoid bleed or subdural hematoma that ultimately he never required surgery. He's regained neurologic function. He presently has significant low some of the edema. I am discontinuing furosemide. I'm starting him on torsemide 40 mg in the morning for the next 3 days and then we'll change this to 20 mg twice a day. Please the laboratory we checked a fasting state. I see him back in the office for six-week followup evaluation or sooner problems arise.   Troy Sine, MD, [facility]  07/08/2013 11:17 AM

## 2013-07-09 ENCOUNTER — Encounter: Payer: Self-pay | Admitting: Cardiovascular Disease

## 2013-07-14 DIAGNOSIS — E785 Hyperlipidemia, unspecified: Secondary | ICD-10-CM | POA: Diagnosis not present

## 2013-07-14 DIAGNOSIS — Z6832 Body mass index (BMI) 32.0-32.9, adult: Secondary | ICD-10-CM | POA: Diagnosis not present

## 2013-07-14 DIAGNOSIS — I2510 Atherosclerotic heart disease of native coronary artery without angina pectoris: Secondary | ICD-10-CM | POA: Diagnosis not present

## 2013-07-14 DIAGNOSIS — E119 Type 2 diabetes mellitus without complications: Secondary | ICD-10-CM | POA: Diagnosis not present

## 2013-07-14 DIAGNOSIS — E039 Hypothyroidism, unspecified: Secondary | ICD-10-CM | POA: Diagnosis not present

## 2013-07-14 DIAGNOSIS — I10 Essential (primary) hypertension: Secondary | ICD-10-CM | POA: Diagnosis not present

## 2013-07-14 DIAGNOSIS — Z79899 Other long term (current) drug therapy: Secondary | ICD-10-CM | POA: Diagnosis not present

## 2013-07-16 DIAGNOSIS — IMO0001 Reserved for inherently not codable concepts without codable children: Secondary | ICD-10-CM | POA: Diagnosis not present

## 2013-07-16 DIAGNOSIS — R269 Unspecified abnormalities of gait and mobility: Secondary | ICD-10-CM | POA: Diagnosis not present

## 2013-07-16 DIAGNOSIS — M6281 Muscle weakness (generalized): Secondary | ICD-10-CM | POA: Diagnosis not present

## 2013-07-23 DIAGNOSIS — M6281 Muscle weakness (generalized): Secondary | ICD-10-CM | POA: Diagnosis not present

## 2013-07-23 DIAGNOSIS — IMO0001 Reserved for inherently not codable concepts without codable children: Secondary | ICD-10-CM | POA: Diagnosis not present

## 2013-07-23 DIAGNOSIS — R269 Unspecified abnormalities of gait and mobility: Secondary | ICD-10-CM | POA: Diagnosis not present

## 2013-07-24 DIAGNOSIS — M6281 Muscle weakness (generalized): Secondary | ICD-10-CM | POA: Diagnosis not present

## 2013-07-24 DIAGNOSIS — IMO0001 Reserved for inherently not codable concepts without codable children: Secondary | ICD-10-CM | POA: Diagnosis not present

## 2013-07-24 DIAGNOSIS — R269 Unspecified abnormalities of gait and mobility: Secondary | ICD-10-CM | POA: Diagnosis not present

## 2013-08-04 DIAGNOSIS — IMO0001 Reserved for inherently not codable concepts without codable children: Secondary | ICD-10-CM | POA: Diagnosis not present

## 2013-08-04 DIAGNOSIS — R269 Unspecified abnormalities of gait and mobility: Secondary | ICD-10-CM | POA: Diagnosis not present

## 2013-08-04 DIAGNOSIS — M6281 Muscle weakness (generalized): Secondary | ICD-10-CM | POA: Diagnosis not present

## 2013-08-12 DIAGNOSIS — M6281 Muscle weakness (generalized): Secondary | ICD-10-CM | POA: Diagnosis not present

## 2013-08-12 DIAGNOSIS — R269 Unspecified abnormalities of gait and mobility: Secondary | ICD-10-CM | POA: Diagnosis not present

## 2013-08-12 DIAGNOSIS — IMO0001 Reserved for inherently not codable concepts without codable children: Secondary | ICD-10-CM | POA: Diagnosis not present

## 2013-08-19 ENCOUNTER — Ambulatory Visit: Payer: Medicare Other | Admitting: Cardiovascular Disease

## 2013-08-21 DIAGNOSIS — R269 Unspecified abnormalities of gait and mobility: Secondary | ICD-10-CM | POA: Diagnosis not present

## 2013-08-21 DIAGNOSIS — M6281 Muscle weakness (generalized): Secondary | ICD-10-CM | POA: Diagnosis not present

## 2013-08-21 DIAGNOSIS — IMO0001 Reserved for inherently not codable concepts without codable children: Secondary | ICD-10-CM | POA: Diagnosis not present

## 2013-08-24 DIAGNOSIS — R269 Unspecified abnormalities of gait and mobility: Secondary | ICD-10-CM | POA: Diagnosis not present

## 2013-08-24 DIAGNOSIS — M6281 Muscle weakness (generalized): Secondary | ICD-10-CM | POA: Diagnosis not present

## 2013-08-24 DIAGNOSIS — IMO0001 Reserved for inherently not codable concepts without codable children: Secondary | ICD-10-CM | POA: Diagnosis not present

## 2013-09-02 DIAGNOSIS — R269 Unspecified abnormalities of gait and mobility: Secondary | ICD-10-CM | POA: Diagnosis not present

## 2013-09-02 DIAGNOSIS — M6281 Muscle weakness (generalized): Secondary | ICD-10-CM | POA: Diagnosis not present

## 2013-09-02 DIAGNOSIS — IMO0001 Reserved for inherently not codable concepts without codable children: Secondary | ICD-10-CM | POA: Diagnosis not present

## 2013-09-04 DIAGNOSIS — IMO0001 Reserved for inherently not codable concepts without codable children: Secondary | ICD-10-CM | POA: Diagnosis not present

## 2013-09-04 DIAGNOSIS — R269 Unspecified abnormalities of gait and mobility: Secondary | ICD-10-CM | POA: Diagnosis not present

## 2013-09-04 DIAGNOSIS — M6281 Muscle weakness (generalized): Secondary | ICD-10-CM | POA: Diagnosis not present

## 2013-09-10 DIAGNOSIS — R262 Difficulty in walking, not elsewhere classified: Secondary | ICD-10-CM | POA: Diagnosis not present

## 2013-09-10 DIAGNOSIS — M6281 Muscle weakness (generalized): Secondary | ICD-10-CM | POA: Diagnosis not present

## 2013-09-10 DIAGNOSIS — R269 Unspecified abnormalities of gait and mobility: Secondary | ICD-10-CM | POA: Diagnosis not present

## 2013-09-10 DIAGNOSIS — IMO0001 Reserved for inherently not codable concepts without codable children: Secondary | ICD-10-CM | POA: Diagnosis not present

## 2013-09-16 ENCOUNTER — Ambulatory Visit (INDEPENDENT_AMBULATORY_CARE_PROVIDER_SITE_OTHER): Payer: Medicare Other | Admitting: Cardiovascular Disease

## 2013-09-16 ENCOUNTER — Encounter: Payer: Self-pay | Admitting: Cardiovascular Disease

## 2013-09-16 VITALS — BP 116/70 | HR 54 | Ht 64.0 in | Wt 206.4 lb

## 2013-09-16 DIAGNOSIS — I2510 Atherosclerotic heart disease of native coronary artery without angina pectoris: Secondary | ICD-10-CM | POA: Diagnosis not present

## 2013-09-16 DIAGNOSIS — E039 Hypothyroidism, unspecified: Secondary | ICD-10-CM

## 2013-09-16 DIAGNOSIS — E119 Type 2 diabetes mellitus without complications: Secondary | ICD-10-CM

## 2013-09-16 DIAGNOSIS — S0990XA Unspecified injury of head, initial encounter: Secondary | ICD-10-CM

## 2013-09-16 DIAGNOSIS — I10 Essential (primary) hypertension: Secondary | ICD-10-CM

## 2013-09-16 DIAGNOSIS — R609 Edema, unspecified: Secondary | ICD-10-CM

## 2013-09-16 DIAGNOSIS — R600 Localized edema: Secondary | ICD-10-CM

## 2013-09-16 NOTE — Patient Instructions (Signed)
Your physician has recommended you make the following change in your medication: decrease the metoprolol 50 mg night dose down to 25 mg ( 1/2 tablet)  Your physician recommends that you schedule a follow-up appointment in: 4 months.

## 2013-09-21 ENCOUNTER — Encounter: Payer: Self-pay | Admitting: Cardiovascular Disease

## 2013-09-21 NOTE — Progress Notes (Signed)
Patient ID: Roberto Haley, male   DOB: [birth year], 73 y.o.   MRN: [id number]    HPI: Roberto Haley, is a 73 y.o. male who presents to the office for 2 month cardiology followup evaluation.   Roberto Haley has known coronary artery disease and underwent CABG revascularization surgery in 1997 with LIMA to LAD, vein to the right artery. In March 1998 he underwent complex intervention to his native LAD and high speed rotational atherectomy (HSRA) and stenting of his LAD and PTCA of his diagonal vessel after he was found to have stenosis in the LIMA graft. In June 2013 due to 99% in-stent restenosis in the LAD stent and a new stenosis of 90% the distal circumflex, he underwent two-vessel intervention with insertion of a 2.75x22 mm resident stent in the LAD and PTCA of the distal circumflex via his previously placed stent which started in the mid left main coronary artery. A cardiopulmonary met test did show a reduced maximum oxygen consumption. I did reduce his beta blocker therapy due to significant chronotropic incompetence, and further titrate his Ranexa to 1000 mg twice a day.  He underwent a nuclear perfusion study in June 2014 which showed distal anteroseptal scar without significant ischemia. He did have PACs and PVCs in gating was not done.  He  fell on the Fourth of July and landed on his head. He states he initially was seen at urgent care, transferred to [facility], and then transferred to [facility] where was hospitalized for a week. He did not have neurosurgery. He was told of having "blood on his brain . " He did have a left-sided per hemiparesis. He then spent 2 months at [facility] rehabilitation unit and has spent additional time a [facility] rehabilitation in [place] until October 31.  He denies recent chest pain. He continues to experience significant leg swelling. He does have memory issues. He has regained his motor strength. I last saw him 2 months ago at which time he was  having significant 2-3+ tense edema of his lower extremities. I discontinued his furosemide and started him on torsemide 40 mg in the morning for 3 days and then 20 mg twice a day. He presents now for followup evaluation.   Past Medical History  Diagnosis Date  . Coronary artery disease   . Diabetes mellitus   . Groin hematoma 02/19/12    RIGHT  . Hyperlipidemia   . Hypertension   . Obesity   . PVC (premature ventricular contraction)   . Chronic kidney disease     CKD STAGE 3;  CHRONIC RENAL INSUFFICIENCY    Past Surgical History  Procedure Laterality Date  . Coronary artery bypass graft  1997    LIMA to the LAD and vein to the RCA;  . Cardiac catheterization  01/14/12    LV FXN EF 50-55% W/MILD DISTAL INFERIOR HYPOKINESIS; PCI: LAD PTCA/STENT W/NEW 2.75X22 RESOLUTE DES IN MID LAD POST DIALTED TO 3.08 TO 2.97 TAPER: 99%-80% TO 0; LCX: PTCA VIA LM STENT W/2.25X12 SPRINTER BALLOON: 90%-5; LAD: PATENT PROXIMAL STENT EXTENDING INTO LM W/30-40% SMOOTH NARROWING IN DISTAL PORTION OF STENT; 99% IN STENT RESTENOSIS OF MID LAD STENT   . Lower venous duplex  02/05/12    ESSENTIALLY NORMAL RIGHT LOWER EXTRIMTY VENOUS DUPLEX DOPPLER EVALUATION  . Lower arterial doppler  01/24/12    ESSENTIALLY NORMAL RIGHT GROIN DUPLEX EVALUATION S/P THROMBIN INJECTION  . Nm myoview ltd  01/09/12    HIGH RISK SCAN. COMPARED TO PREVIOUS  STUDY, THERE IS NOW ISCHEMIA PRESENT. ABNORMAL MYOCARDIAL PERFUSION STUDY. THERE IS NEW MILD INFEROLATERAL ISCHEMIA TOWARDS THE BASE; PT TO FOLLOW UP WITH DR. TK  . Doppler echocardiography  01/09/12    LV NORMAL IN SIZE, NORMAL WAL THICKNESS, EF 50-55%; MILD POSTERIOR WALL HYOPKINESIS, THERE IS MILD INFERIOR WALL HYPOKINESIS    Allergies  Allergen Reactions  . Tramadol Nausea And Vomiting  . Codeine Rash    Current Outpatient Prescriptions  Medication Sig Dispense Refill  . amLODipine (NORVASC) 5 MG tablet Take 1 tablet by mouth daily.      Marland Kitchen aspirin 81 MG tablet Take 81 mg by  mouth daily.      . benazepril (LOTENSIN) 40 MG tablet Take 40 mg by mouth daily.      . cholecalciferol (VITAMIN D) 1000 UNITS tablet Take 1,000 Units by mouth daily.      . fluticasone (FLONASE) 50 MCG/ACT nasal spray 2 sprays daily.      . isosorbide mononitrate (IMDUR) 30 MG 24 hr tablet Take 30 mg by mouth daily.      . metoprolol succinate (TOPROL-XL) 100 MG 24 hr tablet Take 100 mg by mouth daily.      . metoprolol succinate (TOPROL-XL) 50 MG 24 hr tablet Take 50 mg by mouth. Take 1/2 tablet in the PM      . Multiple Vitamin (MULTIVITAMIN WITH MINERALS) TABS Take 1 tablet by mouth daily.      Marland Kitchen NOVOLIN R 100 UNIT/ML injection Inject 5 Units into the skin at bedtime.      Marland Kitchen oxybutynin (DITROPAN) 5 MG tablet Take 1 tablet by mouth daily.      . polyethylene glycol powder (GLYCOLAX/MIRALAX) powder       . potassium chloride SA (K-DUR,KLOR-CON) 20 MEQ tablet Take 1 tablet by mouth daily.      . QUEtiapine (SEROQUEL) 25 MG tablet Take 25 mg by mouth. Take 1/2 tablet in the morning and 1 at bedtime.      . ranitidine (ZANTAC) 150 MG tablet Take 150 mg by mouth 2 (two) times daily.      . sitaGLIPtin (JANUVIA) 100 MG tablet Take 100 mg by mouth daily.      Marland Kitchen SYNTHROID 75 MCG tablet 1 tablet daily.      Marland Kitchen torsemide (DEMADEX) 20 MG tablet Take 20 mg by mouth daily.      . VENTOLIN HFA 108 (90 BASE) MCG/ACT inhaler Inhale 1 puff into the lungs as needed.      . vitamin C (ASCORBIC ACID) 500 MG tablet Take 500 mg by mouth at bedtime and may repeat dose one time if needed.      . nitroGLYCERIN (NITROSTAT) 0.4 MG SL tablet Place 0.4 mg under the tongue every 5 (five) minutes as needed. For chest pain       No current facility-administered medications for this visit.    Socially, he is married has one child 2 grandchildren and 2 great-grandchildren. There is no tobacco or alcohol use.  ROV is negative for fever chills night sweats. He denies skin rash. He denies visual changes. He denies change in  hearing. There is no adenopathy He denies palpitations. He does note shortness of breath with activity.  He denies recent chest pain or angina. At times he has some mild constipation. His leg swelling did improve with torsemide He denies recent headache. He denies paresthesias. He is diabetic. He has a history of hyperlipidemia. He also has a history of hypothyroidism on  Synthroid replacement. There is no claudication. There are no myalgias or Other comprehensive 14 point systems review is negative.  PE BP 116/70  Pulse 54  Ht _0  (1.626 m)  Wt 206 lb 6.4 oz (93.622 kg)  BMI 35.41 kg/m2  General: Alert, oriented, no distress.  HEENT: Normocephalic, atraumatic. Pupils round and reactive; sclera anicteric; no lid lag. Atrophic muscles intact. No xanthelasmas. Nose without nasal septal hypertrophy Mouth/Parynx benign; Mallinpatti scale 3 Neck: No JVD, no carotid bruits with normal carotid Chest wall: Nontender to palpation Lungs: clear to ausculatation and percussion; no wheezing or rales Heart: RRR, s1 s2 normal 2/6 sem, unchanged Abdomen: soft, nontender; no hepatosplenomehaly, BS+; abdominal aorta nontender and not dilated by palpation. Mild central adiposity Pulses 2+ Extremities: Previous 2-3+ tense edema bilaterally  in his lower extremities significantly improved  no clubbing cyanosis, Homan's sign negative  Neurologic: grossly nonfocal Psychologic: Normal affect and mood   ECG (independently read by me): Sinus rhythm at 54 beats per minute. No ectopy. Normal intervals.  ECG from 07/08/2013 : reveals sinus rhythm at 57 beats per minute. QTc interval 453 ms.  LABS:  BMET    Component Value Date/Time   NA 138 01/18/2012 1500   K 3.7 01/18/2012 1500   CL 100 01/18/2012 1500   CO2 28 01/18/2012 1500   GLUCOSE 223* 01/18/2012 1500   BUN 23 01/18/2012 1500   CREATININE 1.22 01/18/2012 1500   CALCIUM 9.1 01/18/2012 1500   GFRNONAA 59* 01/18/2012 1500   GFRAA 68* 01/18/2012 1500      Hepatic Function Panel  No results found for this basename: prot,  albumin,  ast,  alt,  alkphos,  bilitot,  bilidir,  ibili     CBC    Component Value Date/Time   WBC 9.1 01/19/2012 0530   RBC 3.41* 01/19/2012 0530   HGB 10.1* 01/19/2012 0530   HCT 30.1* 01/19/2012 0530   PLT 190 01/19/2012 0530   MCV 88.3 01/19/2012 0530   MCH 29.6 01/19/2012 0530   MCHC 33.6 01/19/2012 0530   RDW 14.0 01/19/2012 0530     BNP No results found for this basename: probnp    Lipid Panel  No results found for this basename: chol,  trig,  hdl,  cholhdl,  vldl,  ldlcalc     RADIOLOGY: No results found.    ASSESSMENT AND PLAN:   Roberto. Sibal is now 17 years status post CABG revascularization surgery as has a documented occluded LIMA. He previously undergone intervention both to his proximal LAD extending into the left main with stenting and stenting of his mid LAD. In June 2013 he developed severe in-stent restenosis of his LAD stent and a DES stent was in place at that time. We also went through the stent struts in the left main and a PTCA of the distal circumflex. He did have a mild/moderate disease in his PDA after his SVG to his PDA insertion. His last nuclear perfusion study in June after experiencing some vague episodes of chest pain was low risk and showed distal anteroseptal scar with the extent of 10% without associated ischemia. He did sustain trauma leading to injury of his brain. I am not certain if he had a subarachnoid bleed or subdural hematoma that ultimately he never required surgery. He's regained neurologic function. his previous significant tense edema has significantly improved with the change to torsemide from furosemide. He is somewhat bradycardic today I am recommending a dose reduction of his metoprolol succinate from 50  mg daily to 25 mg. He is on Synthroid for thyroid replacement. Insulin for his diabetes. In addition to his metoprolol he is also on amlodipine and Avapro for  blood pressure control. I will see him in 4 months for cardiology reevaluation.  Time spent: 25 minutes  Troy Sine, MD, [facility]  09/21/2013 10:53 AM

## 2013-10-12 DIAGNOSIS — Z1331 Encounter for screening for depression: Secondary | ICD-10-CM | POA: Diagnosis not present

## 2013-10-12 DIAGNOSIS — E119 Type 2 diabetes mellitus without complications: Secondary | ICD-10-CM | POA: Diagnosis not present

## 2013-10-12 DIAGNOSIS — Z6831 Body mass index (BMI) 31.0-31.9, adult: Secondary | ICD-10-CM | POA: Diagnosis not present

## 2013-10-12 DIAGNOSIS — I10 Essential (primary) hypertension: Secondary | ICD-10-CM | POA: Diagnosis not present

## 2013-10-12 DIAGNOSIS — I2510 Atherosclerotic heart disease of native coronary artery without angina pectoris: Secondary | ICD-10-CM | POA: Diagnosis not present

## 2013-10-12 DIAGNOSIS — E039 Hypothyroidism, unspecified: Secondary | ICD-10-CM | POA: Diagnosis not present

## 2013-10-12 DIAGNOSIS — Z9181 History of falling: Secondary | ICD-10-CM | POA: Diagnosis not present

## 2013-10-12 DIAGNOSIS — IMO0001 Reserved for inherently not codable concepts without codable children: Secondary | ICD-10-CM | POA: Diagnosis not present

## 2013-10-12 DIAGNOSIS — E785 Hyperlipidemia, unspecified: Secondary | ICD-10-CM | POA: Diagnosis not present

## 2014-01-05 DIAGNOSIS — IMO0001 Reserved for inherently not codable concepts without codable children: Secondary | ICD-10-CM | POA: Diagnosis not present

## 2014-01-05 DIAGNOSIS — Z6832 Body mass index (BMI) 32.0-32.9, adult: Secondary | ICD-10-CM | POA: Diagnosis not present

## 2014-01-05 DIAGNOSIS — H2589 Other age-related cataract: Secondary | ICD-10-CM | POA: Diagnosis not present

## 2014-01-05 DIAGNOSIS — E11329 Type 2 diabetes mellitus with mild nonproliferative diabetic retinopathy without macular edema: Secondary | ICD-10-CM | POA: Diagnosis not present

## 2014-01-18 DIAGNOSIS — I10 Essential (primary) hypertension: Secondary | ICD-10-CM | POA: Diagnosis not present

## 2014-01-18 DIAGNOSIS — IMO0001 Reserved for inherently not codable concepts without codable children: Secondary | ICD-10-CM | POA: Diagnosis not present

## 2014-01-18 DIAGNOSIS — Z125 Encounter for screening for malignant neoplasm of prostate: Secondary | ICD-10-CM | POA: Diagnosis not present

## 2014-01-18 DIAGNOSIS — E785 Hyperlipidemia, unspecified: Secondary | ICD-10-CM | POA: Diagnosis not present

## 2014-01-18 DIAGNOSIS — E039 Hypothyroidism, unspecified: Secondary | ICD-10-CM | POA: Diagnosis not present

## 2014-01-23 ENCOUNTER — Other Ambulatory Visit: Payer: Self-pay | Admitting: Cardiovascular Disease

## 2014-01-25 ENCOUNTER — Ambulatory Visit (INDEPENDENT_AMBULATORY_CARE_PROVIDER_SITE_OTHER): Payer: Medicare Other | Admitting: Cardiovascular Disease

## 2014-01-25 ENCOUNTER — Encounter: Payer: Self-pay | Admitting: Cardiovascular Disease

## 2014-01-25 ENCOUNTER — Other Ambulatory Visit: Payer: Self-pay

## 2014-01-25 VITALS — BP 130/78 | HR 52 | Ht 68.5 in | Wt 209.3 lb

## 2014-01-25 DIAGNOSIS — I2510 Atherosclerotic heart disease of native coronary artery without angina pectoris: Secondary | ICD-10-CM | POA: Diagnosis not present

## 2014-01-25 DIAGNOSIS — N289 Disorder of kidney and ureter, unspecified: Secondary | ICD-10-CM

## 2014-01-25 DIAGNOSIS — I10 Essential (primary) hypertension: Secondary | ICD-10-CM | POA: Diagnosis not present

## 2014-01-25 DIAGNOSIS — R609 Edema, unspecified: Secondary | ICD-10-CM | POA: Diagnosis not present

## 2014-01-25 DIAGNOSIS — R600 Localized edema: Secondary | ICD-10-CM

## 2014-01-25 NOTE — Patient Instructions (Addendum)
Your physician has recommended you make the following change in your medication: stop the 1/2  metoprolol 50 mg tablet. Take the 100 mg tablet in the morning.  Your physician recommends that you schedule a follow-up appointment in: 6 months.

## 2014-01-26 DIAGNOSIS — H2589 Other age-related cataract: Secondary | ICD-10-CM | POA: Diagnosis not present

## 2014-01-26 DIAGNOSIS — I2510 Atherosclerotic heart disease of native coronary artery without angina pectoris: Secondary | ICD-10-CM | POA: Diagnosis not present

## 2014-01-26 DIAGNOSIS — H9190 Unspecified hearing loss, unspecified ear: Secondary | ICD-10-CM | POA: Diagnosis not present

## 2014-01-26 DIAGNOSIS — H269 Unspecified cataract: Secondary | ICD-10-CM | POA: Diagnosis not present

## 2014-01-26 DIAGNOSIS — G4730 Sleep apnea, unspecified: Secondary | ICD-10-CM | POA: Diagnosis not present

## 2014-01-26 DIAGNOSIS — K219 Gastro-esophageal reflux disease without esophagitis: Secondary | ICD-10-CM | POA: Diagnosis not present

## 2014-01-26 DIAGNOSIS — Z7982 Long term (current) use of aspirin: Secondary | ICD-10-CM | POA: Diagnosis not present

## 2014-01-26 DIAGNOSIS — Z9861 Coronary angioplasty status: Secondary | ICD-10-CM | POA: Diagnosis not present

## 2014-01-26 DIAGNOSIS — E785 Hyperlipidemia, unspecified: Secondary | ICD-10-CM | POA: Diagnosis not present

## 2014-01-26 DIAGNOSIS — M109 Gout, unspecified: Secondary | ICD-10-CM | POA: Diagnosis not present

## 2014-01-26 DIAGNOSIS — H268 Other specified cataract: Secondary | ICD-10-CM | POA: Diagnosis not present

## 2014-01-26 DIAGNOSIS — Z951 Presence of aortocoronary bypass graft: Secondary | ICD-10-CM | POA: Diagnosis not present

## 2014-01-26 DIAGNOSIS — E119 Type 2 diabetes mellitus without complications: Secondary | ICD-10-CM | POA: Diagnosis not present

## 2014-01-26 DIAGNOSIS — I10 Essential (primary) hypertension: Secondary | ICD-10-CM | POA: Diagnosis not present

## 2014-01-26 NOTE — Telephone Encounter (Signed)
Rx was sent to pharmacy electronically. 

## 2014-01-27 DIAGNOSIS — E119 Type 2 diabetes mellitus without complications: Secondary | ICD-10-CM | POA: Diagnosis not present

## 2014-01-27 DIAGNOSIS — H2589 Other age-related cataract: Secondary | ICD-10-CM | POA: Diagnosis not present

## 2014-01-29 ENCOUNTER — Encounter: Payer: Self-pay | Admitting: Cardiovascular Disease

## 2014-01-29 NOTE — Progress Notes (Signed)
Patient ID: Roberto Haley, male   DOB: 1941/07/25, 73 y.o.   MRN: [id number]    HPI: Roberto Haley is a 73 y.o. male who presents to the office for 2 month cardiology followup evaluation.   Mr Ebron has known coronary artery disease and underwent CABG revascularization surgery in 1997 with LIMA to LAD, vein to the right artery. In March 1998 he underwent complex intervention to his native LAD and high speed rotational atherectomy (HSRA) and stenting of his LAD and PTCA of his diagonal vessel after he was found to have stenosis in the LIMA graft. In June 2013 due to 99% in-stent restenosis in the LAD stent and a new stenosis of 90% the distal circumflex, he underwent two-vessel intervention with insertion of a 2.75x22 mm resident stent in the LAD and PTCA of the distal circumflex via his previously placed stent which started in the mid left main coronary artery. A cardiopulmonary met test showed reduced maximum oxygen consumption. I did reduce his beta blocker therapy due to significant chronotropic incompetence and further titrate his Ranexa to 1000 mg twice a day.  A nuclear perfusion study in June 2014 showed distal anteroseptal scar without significant ischemia. He did have PACs and PVCs in gating was not done.  He  fell on the Fourth of July and landed on his head. He states he initially was seen at urgent care, transferred to [facility], and then transferred to [facility] where was hospitalized for a week. He did not have neurosurgery. He was told of having "blood on his brain."  He did have a left-sided per hemiparesis. He then spent 2 months at [facility] rehabilitation unit and has spent additional time a [facility] rehabilitation in [place] until October 31.  I last saw him he denied recent chest pain. He continued to experience significant leg swelling. He does have memory issues. He has regained his motor strength.  Recently, he has been on amlodipine 5 mg, Lotensin 40 mg,  metoprolol succinate 100 mg in the morning and 25 mg at night and torsemide 20 mg.  His peripheral edema has improved.  He is scheduled to undergo cataract surgery.  Past Medical History  Diagnosis Date  . Coronary artery disease   . Diabetes mellitus   . Groin hematoma 02/19/12    RIGHT  . Hyperlipidemia   . Hypertension   . Obesity   . PVC (premature ventricular contraction)   . Chronic kidney disease     CKD STAGE 3;  CHRONIC RENAL INSUFFICIENCY    Past Surgical History  Procedure Laterality Date  . Coronary artery bypass graft  1997    LIMA to the LAD and vein to the RCA;  . Cardiac catheterization  01/14/12    LV FXN EF 50-55% W/MILD DISTAL INFERIOR HYPOKINESIS; PCI: LAD PTCA/STENT W/NEW 2.75X22 RESOLUTE DES IN MID LAD POST DIALTED TO 3.08 TO 2.97 TAPER: 99%-80% TO 0; LCX: PTCA VIA LM STENT W/2.25X12 SPRINTER BALLOON: 90%-5; LAD: PATENT PROXIMAL STENT EXTENDING INTO LM W/30-40% SMOOTH NARROWING IN DISTAL PORTION OF STENT; 99% IN STENT RESTENOSIS OF MID LAD STENT   . Lower venous duplex  02/05/12    ESSENTIALLY NORMAL RIGHT LOWER EXTRIMTY VENOUS DUPLEX DOPPLER EVALUATION  . Lower arterial doppler  01/24/12    ESSENTIALLY NORMAL RIGHT GROIN DUPLEX EVALUATION S/P THROMBIN INJECTION  . Nm myoview ltd  01/09/12    HIGH RISK SCAN. COMPARED TO PREVIOUS STUDY, THERE IS NOW ISCHEMIA PRESENT. ABNORMAL MYOCARDIAL PERFUSION STUDY. THERE IS  NEW MILD INFEROLATERAL ISCHEMIA TOWARDS THE BASE; PT TO FOLLOW UP WITH DR. TK  . Doppler echocardiography  01/09/12    LV NORMAL IN SIZE, NORMAL WAL THICKNESS, EF 50-55%; MILD POSTERIOR WALL HYOPKINESIS, THERE IS MILD INFERIOR WALL HYPOKINESIS    Allergies  Allergen Reactions  . Tramadol Nausea And Vomiting  . Codeine Rash    Current Outpatient Prescriptions  Medication Sig Dispense Refill  . amLODipine (NORVASC) 5 MG tablet Take 1 tablet by mouth daily.      Marland Kitchen aspirin 81 MG tablet Take 81 mg by mouth daily.      . benazepril (LOTENSIN) 40 MG tablet Take  40 mg by mouth daily.      . cholecalciferol (VITAMIN D) 1000 UNITS tablet Take 1,000 Units by mouth daily.      . fluticasone (FLONASE) 50 MCG/ACT nasal spray 2 sprays daily.      . insulin aspart (NOVOLOG) 100 UNIT/ML FlexPen Inject 10 Units into the skin every evening.      . Insulin Glargine (LANTUS SOLOSTAR) 100 UNIT/ML Solostar Pen Inject 35 Units into the skin daily at 10 pm.      . isosorbide mononitrate (IMDUR) 30 MG 24 hr tablet Take 30 mg by mouth daily.      . Multiple Vitamin (MULTIVITAMIN WITH MINERALS) TABS Take 1 tablet by mouth daily.      Marland Kitchen oxybutynin (DITROPAN) 5 MG tablet Take 1 tablet by mouth daily.      . polyethylene glycol powder (GLYCOLAX/MIRALAX) powder       . potassium chloride SA (K-DUR,KLOR-CON) 20 MEQ tablet Take 1 tablet by mouth daily.      . pravastatin (PRAVACHOL) 80 MG tablet Take 80 mg by mouth daily.      . QUEtiapine (SEROQUEL) 25 MG tablet Take 25 mg by mouth. Take 1/2 tablet in the morning and 1 at bedtime.      . ranitidine (ZANTAC) 150 MG tablet Take 150 mg by mouth 2 (two) times daily.      . sitaGLIPtin (JANUVIA) 100 MG tablet Take 100 mg by mouth daily.      Marland Kitchen SYNTHROID 75 MCG tablet 1 tablet daily.      Marland Kitchen torsemide (DEMADEX) 20 MG tablet Take 20 mg by mouth daily.      . VENTOLIN HFA 108 (90 BASE) MCG/ACT inhaler Inhale 1 puff into the lungs as needed.      . vitamin C (ASCORBIC ACID) 500 MG tablet Take 500 mg by mouth at bedtime and may repeat dose one time if needed.      . metoprolol succinate (TOPROL-XL) 100 MG 24 hr tablet Take 1 tablet (100 mg total) by mouth daily.  30 tablet  11  . nitroGLYCERIN (NITROSTAT) 0.4 MG SL tablet Place 0.4 mg under the tongue every 5 (five) minutes as needed. For chest pain       No current facility-administered medications for this visit.    Socially, he is married has one child 2 grandchildren and 2 great-grandchildren. There is no tobacco or alcohol use.  ROS General: Negative; No fevers, chills, or  night sweats;  HEENT: Positive for cataracts; No changes in hearing, sinus congestion, difficulty swallowing Pulmonary: Negative; No cough, wheezing, shortness of breath, hemoptysis Cardiovascular: See history of present illness; No chest pain, presyncope, syncope, palpatations GI: Negative; No nausea, vomiting, diarrhea, or abdominal pain GU: Negative; No dysuria, hematuria, or difficulty voiding Musculoskeletal: Negative; no myalgias, joint pain, or weakness Hematologic/Oncology: Negative; no easy bruising,  bleeding Endocrine: Positive for hypothyroidism, on Synthroid replacement no heat/cold intolerance; no diabetes Neuro: Negative; no changes in balance, headaches Skin: Negative; No rashes or skin lesions Psychiatric: Negative; No behavioral problems, depression Sleep: Negative; No snoring, daytime sleepiness, hypersomnolence, bruxism, restless legs, hypnogognic hallucinations, no cataplexy Other comprehensive 14 point system review is negative.  PE BP 130/78  Pulse 52  Ht 5' 8.5" (1.74 m)  Wt 209 lb 4.8 oz (94.938 kg)  BMI 31.36 kg/m2  General: Alert, oriented, no distress.  HEENT: Normocephalic, atraumatic. Pupils round and reactive; sclera anicteric; no lid lag. Atrophic muscles intact. No xanthelasmas. Nose without nasal septal hypertrophy Mouth/Parynx benign; Mallinpatti scale 3 Neck: No JVD, no carotid bruits with normal carotid Chest wall: Nontender to palpation Lungs: clear to ausculatation and percussion; no wheezing or rales Heart: RRR, s1 s2 normal 2/6 sem, unchanged, no S3, gallop Abdomen: soft, nontender; no hepatosplenomehaly, BS+; abdominal aorta nontender and not dilated by palpation. Mild central adiposity Pulses 2+ Extremities: Trivial residual edema; no clubbing cyanosis, Homan's sign negative  Neurologic: grossly nonfocal Psychologic: Normal affect and mood  ECG (independently read by me): Sinus bradycardia 52 beats per minute.  Borderline first degree AV  block with a PR interval of 200 ms.  December 2014 ECG (independently read by me): Sinus rhythm at 54 beats per minute. No ectopy. Normal intervals.  ECG from 07/08/2013 : reveals sinus rhythm at 57 beats per minute. QTc interval 453 ms.  LABS:  BMET    Component Value Date/Time   NA 138 01/18/2012 1500   K 3.7 01/18/2012 1500   CL 100 01/18/2012 1500   CO2 28 01/18/2012 1500   GLUCOSE 223* 01/18/2012 1500   BUN 23 01/18/2012 1500   CREATININE 1.22 01/18/2012 1500   CALCIUM 9.1 01/18/2012 1500   GFRNONAA 59* 01/18/2012 1500   GFRAA 68* 01/18/2012 1500     Hepatic Function Panel  No results found for this basename: prot,  albumin,  ast,  alt,  alkphos,  bilitot,  bilidir,  ibili     CBC    Component Value Date/Time   WBC 9.1 01/19/2012 0530   RBC 3.41* 01/19/2012 0530   HGB 10.1* 01/19/2012 0530   HCT 30.1* 01/19/2012 0530   PLT 190 01/19/2012 0530   MCV 88.3 01/19/2012 0530   MCH 29.6 01/19/2012 0530   MCHC 33.6 01/19/2012 0530   RDW 14.0 01/19/2012 0530     BNP No results found for this basename: probnp    Lipid Panel  No results found for this basename: chol,  trig,  hdl,  cholhdl,  vldl,  ldlcalc     RADIOLOGY: No results found.    ASSESSMENT AND PLAN:   Mr. Kimoto is 18 years status post CABG revascularization surgery as has a documented occluded LIMA. He previously undergone intervention both to his proximal LAD extending into the left main with stenting and stenting of his mid LAD. In June 2013 he developed severe in-stent restenosis of his LAD stent and a DES stent was in place at that time. We also went through the stent struts in the left main and a PTCA of the distal circumflex. He did have a mild/moderate disease in his PDA after his SVG to his PDA insertion. His last nuclear perfusion study in June after experiencing some vague episodes of chest pain was low risk and showed distal anteroseptal scar with the extent of 10% without associated ischemia. He did  sustain trauma leading to injury of his brain.  He has recovered from his head trauma. Presently, his blood pressure is well controlled on his current medical regimen, consisting of amlodipine, benazepril beta blocker therapy.  However, he is bradycardic, and I recommended he discontinue his evening dose of metoprolol succinate and only take 100 mg in the morning.  He will continue to take the torsemide 20 mg for his peripheral edema, which has significantly improved.  He's not having recent anginal symptoms.  His dose of pravastatin was recently increased by Dr. Elissa Hefty from 40-80 mg.  Subsequent blood work will need to be performed to assess efficacy and if he still is not at LDL target less than 70 I would recommend changing him to a more aggressive statin.  I will see him in 6 months for reevaluation or sooner if problems arise.   Time spent: 25 minutes  Troy Sine, MD, [facility]  01/29/2014 11:16 AM

## 2014-02-22 DIAGNOSIS — E11319 Type 2 diabetes mellitus with unspecified diabetic retinopathy without macular edema: Secondary | ICD-10-CM | POA: Diagnosis not present

## 2014-02-22 DIAGNOSIS — E1139 Type 2 diabetes mellitus with other diabetic ophthalmic complication: Secondary | ICD-10-CM | POA: Diagnosis not present

## 2014-02-22 DIAGNOSIS — H35369 Drusen (degenerative) of macula, unspecified eye: Secondary | ICD-10-CM | POA: Diagnosis not present

## 2014-03-01 DIAGNOSIS — E1129 Type 2 diabetes mellitus with other diabetic kidney complication: Secondary | ICD-10-CM | POA: Diagnosis not present

## 2014-03-01 DIAGNOSIS — R112 Nausea with vomiting, unspecified: Secondary | ICD-10-CM | POA: Diagnosis not present

## 2014-03-01 DIAGNOSIS — Z6831 Body mass index (BMI) 31.0-31.9, adult: Secondary | ICD-10-CM | POA: Diagnosis not present

## 2014-03-08 ENCOUNTER — Ambulatory Visit: Payer: Medicare Other | Admitting: Cardiovascular Disease

## 2014-03-25 DIAGNOSIS — E1039 Type 1 diabetes mellitus with other diabetic ophthalmic complication: Secondary | ICD-10-CM | POA: Diagnosis not present

## 2014-03-25 DIAGNOSIS — E11329 Type 2 diabetes mellitus with mild nonproliferative diabetic retinopathy without macular edema: Secondary | ICD-10-CM | POA: Diagnosis not present

## 2014-03-25 DIAGNOSIS — E11311 Type 2 diabetes mellitus with unspecified diabetic retinopathy with macular edema: Secondary | ICD-10-CM | POA: Diagnosis not present

## 2014-04-15 DIAGNOSIS — E11329 Type 2 diabetes mellitus with mild nonproliferative diabetic retinopathy without macular edema: Secondary | ICD-10-CM | POA: Diagnosis not present

## 2014-04-15 DIAGNOSIS — E1039 Type 1 diabetes mellitus with other diabetic ophthalmic complication: Secondary | ICD-10-CM | POA: Diagnosis not present

## 2014-04-15 DIAGNOSIS — E11311 Type 2 diabetes mellitus with unspecified diabetic retinopathy with macular edema: Secondary | ICD-10-CM | POA: Diagnosis not present

## 2014-04-22 DIAGNOSIS — E1165 Type 2 diabetes mellitus with hyperglycemia: Secondary | ICD-10-CM | POA: Diagnosis not present

## 2014-04-22 DIAGNOSIS — E039 Hypothyroidism, unspecified: Secondary | ICD-10-CM | POA: Diagnosis not present

## 2014-04-22 DIAGNOSIS — I10 Essential (primary) hypertension: Secondary | ICD-10-CM | POA: Diagnosis not present

## 2014-04-22 DIAGNOSIS — I2510 Atherosclerotic heart disease of native coronary artery without angina pectoris: Secondary | ICD-10-CM | POA: Diagnosis not present

## 2014-04-22 DIAGNOSIS — Z79899 Other long term (current) drug therapy: Secondary | ICD-10-CM | POA: Diagnosis not present

## 2014-04-22 DIAGNOSIS — E1129 Type 2 diabetes mellitus with other diabetic kidney complication: Secondary | ICD-10-CM | POA: Diagnosis not present

## 2014-04-22 DIAGNOSIS — E785 Hyperlipidemia, unspecified: Secondary | ICD-10-CM | POA: Diagnosis not present

## 2014-05-11 DIAGNOSIS — H26492 Other secondary cataract, left eye: Secondary | ICD-10-CM | POA: Diagnosis not present

## 2014-05-18 DIAGNOSIS — H26492 Other secondary cataract, left eye: Secondary | ICD-10-CM | POA: Diagnosis not present

## 2014-05-20 DIAGNOSIS — E11331 Type 2 diabetes mellitus with moderate nonproliferative diabetic retinopathy with macular edema: Secondary | ICD-10-CM | POA: Diagnosis not present

## 2014-05-20 DIAGNOSIS — E11329 Type 2 diabetes mellitus with mild nonproliferative diabetic retinopathy without macular edema: Secondary | ICD-10-CM | POA: Diagnosis not present

## 2014-05-25 ENCOUNTER — Encounter: Payer: Self-pay | Admitting: Internal Medicine

## 2014-06-02 ENCOUNTER — Ambulatory Visit (INDEPENDENT_AMBULATORY_CARE_PROVIDER_SITE_OTHER): Payer: Medicare Other | Admitting: Cardiovascular Disease

## 2014-06-02 ENCOUNTER — Encounter: Payer: Self-pay | Admitting: Cardiovascular Disease

## 2014-06-02 VITALS — BP 132/82 | HR 50 | Ht 68.0 in | Wt 203.0 lb

## 2014-06-02 DIAGNOSIS — R600 Localized edema: Secondary | ICD-10-CM

## 2014-06-02 DIAGNOSIS — I10 Essential (primary) hypertension: Secondary | ICD-10-CM | POA: Diagnosis not present

## 2014-06-02 DIAGNOSIS — I2510 Atherosclerotic heart disease of native coronary artery without angina pectoris: Secondary | ICD-10-CM

## 2014-06-02 DIAGNOSIS — I2583 Coronary atherosclerosis due to lipid rich plaque: Principal | ICD-10-CM

## 2014-06-02 DIAGNOSIS — E785 Hyperlipidemia, unspecified: Secondary | ICD-10-CM

## 2014-06-02 NOTE — Patient Instructions (Signed)
Your physician has recommended you make the following change in your medication: Decrease the metoprolol to 50 mg daily. ( 1/2 tablet).   Your physician wants you to follow-up in: 6 months or sooner if needed with Dr. Dow Adolph will receive a reminder letter in the mail two months in advance. If you don't receive a letter, please call our office to schedule the follow-up appointment.

## 2014-06-05 DIAGNOSIS — E785 Hyperlipidemia, unspecified: Secondary | ICD-10-CM | POA: Insufficient documentation

## 2014-06-05 NOTE — Progress Notes (Signed)
Patient ID: Roberto Haley, male   DOB: 1941/07/20, 73 y.o.   MRN: [id number]    HPI: Roberto Haley is a 73 y.o. male who presents to the office for 4 month cardiology followup evaluation.   Mr Crehan has known coronary artery disease and underwent CABG revascularization surgery in 1997 with LIMA to LAD, vein to the RCA. In March 1998 he underwent complex intervention to his native LAD and high speed rotational atherectomy (HSRA) and stenting of his LAD and PTCA of his diagonal vessel after he was found to have stenosis in the LIMA graft. In June 2013 due to 99% in-stent restenosis in the LAD stent and a new stenosis of 90% the distal circumflex, he underwent two-vessel intervention with insertion of a 2.75x22 mm resident stent in the LAD and PTCA of the distal circumflex via his previously placed stent which started in the mid left main coronary artery. A cardiopulmonary met test showed reduced maximum oxygen consumption. I did reduce his beta blocker therapy due to significant chronotropic incompetence and further titrated his Ranexa to 1000 mg twice a day.  A nuclear perfusion study in June 2014 showed distal anteroseptal scar without significant ischemia. He did have PACs and PVCs in gating was not done.  He  fell on the Fourth of July and landed on his head. He states he initially was seen at urgent care, transferred to [facility], and then transferred to [facility] where was hospitalized for a week. He did not have neurosurgery. He was told of having "blood on his brain."  He did have a left-sided per hemiparesis. He then spent 2 months at [facility] rehabilitation unit and [facility] additional [facility] [place] until October 31.  I last saw him he denied recent chest pain. He continued to experience significant leg swelling. He does have memory issues. He has regained his motor strength.  Recently, he has been on amlodipine 5 mg, Lotensin 40 mg,  metoprolol succinate 100 mg in the morning and 25 mg at night and torsemide 20 mg.  His peripheral edema has improved.  He is scheduled to undergo cataract surgery.  Since I last saw him, he states that he is significantly improved following his head trauma.  His memory is back.  He had cataract surgery.  He denies chest pain.  He denies PND, orthopnea.  When I last saw him, I reduced his beta blocker due to the cardia.  He continues to be on pravastatin 80 mg for hyperlipidemia.  States his blood pressure has been controlled with amlodipine 5 mg, Toprol-XL 100 mg torsemide 20 mg and Lotensin 40 mg.  He presents for evaluation.  Past Medical History  Diagnosis Date  . Coronary artery disease   . Diabetes mellitus   . Groin hematoma 02/19/12    RIGHT  . Hyperlipidemia   . Hypertension   . Obesity   . PVC (premature ventricular contraction)   . Chronic kidney disease     CKD STAGE 3;  CHRONIC RENAL INSUFFICIENCY    Past Surgical History  Procedure Laterality Date  . Coronary artery bypass graft  1997    LIMA to the LAD and vein to the RCA;  . Cardiac catheterization  01/14/12    LV FXN EF 50-55% W/MILD DISTAL INFERIOR HYPOKINESIS; PCI: LAD PTCA/STENT W/NEW 2.75X22 RESOLUTE DES IN MID LAD POST DIALTED TO 3.08 TO 2.97 TAPER: 99%-80% TO 0; LCX: PTCA VIA LM STENT W/2.25X12 SPRINTER BALLOON: 90%-5; LAD:  PATENT PROXIMAL STENT EXTENDING INTO LM W/30-40% SMOOTH NARROWING IN DISTAL PORTION OF STENT; 99% IN STENT RESTENOSIS OF MID LAD STENT   . Lower venous duplex  02/05/12    ESSENTIALLY NORMAL RIGHT LOWER EXTRIMTY VENOUS DUPLEX DOPPLER EVALUATION  . Lower arterial doppler  01/24/12    ESSENTIALLY NORMAL RIGHT GROIN DUPLEX EVALUATION S/P THROMBIN INJECTION  . Nm myoview ltd  01/09/12    HIGH RISK SCAN. COMPARED TO PREVIOUS STUDY, THERE IS NOW ISCHEMIA PRESENT. ABNORMAL MYOCARDIAL PERFUSION STUDY. THERE IS NEW MILD INFEROLATERAL ISCHEMIA TOWARDS THE BASE; PT TO FOLLOW UP WITH DR. TK  . Doppler  echocardiography  01/09/12    LV NORMAL IN SIZE, NORMAL WAL THICKNESS, EF 50-55%; MILD POSTERIOR WALL HYOPKINESIS, THERE IS MILD INFERIOR WALL HYPOKINESIS    Allergies  Allergen Reactions  . Tramadol Nausea And Vomiting  . Codeine Rash    Current Outpatient Prescriptions  Medication Sig Dispense Refill  . amLODipine (NORVASC) 5 MG tablet Take 1 tablet by mouth daily.      Marland Kitchen aspirin 81 MG tablet Take 81 mg by mouth daily.      . benazepril (LOTENSIN) 40 MG tablet Take 40 mg by mouth daily.      . cholecalciferol (VITAMIN D) 1000 UNITS tablet Take 1,000 Units by mouth daily.      . fluticasone (FLONASE) 50 MCG/ACT nasal spray 2 sprays daily.      . insulin aspart (NOVOLOG) 100 UNIT/ML FlexPen Inject 15 Units into the skin 2 (two) times daily.       . Insulin Glargine (LANTUS SOLOSTAR) 100 UNIT/ML Solostar Pen Inject 55 Units into the skin daily at 10 pm.       . isosorbide mononitrate (IMDUR) 30 MG 24 hr tablet Take 30 mg by mouth daily.      . metoprolol succinate (TOPROL-XL) 100 MG 24 hr tablet Take 50 mg by mouth daily.      . Multiple Vitamin (MULTIVITAMIN WITH MINERALS) TABS Take 1 tablet by mouth daily.      Marland Kitchen NOVOFINE AUTOCOVER 30G X 8 MM MISC       . oxybutynin (DITROPAN) 5 MG tablet Take 1 tablet by mouth daily.      . polyethylene glycol powder (GLYCOLAX/MIRALAX) powder       . potassium chloride SA (K-DUR,KLOR-CON) 20 MEQ tablet Take 1 tablet by mouth daily.      . pravastatin (PRAVACHOL) 80 MG tablet Take 80 mg by mouth daily.      . ranitidine (ZANTAC) 150 MG tablet Take 150 mg by mouth 2 (two) times daily.      . sitaGLIPtin (JANUVIA) 100 MG tablet Take 100 mg by mouth daily.      Marland Kitchen SYNTHROID 75 MCG tablet 1 tablet daily.      Marland Kitchen torsemide (DEMADEX) 20 MG tablet Take 20 mg by mouth daily.      . VENTOLIN HFA 108 (90 BASE) MCG/ACT inhaler Inhale 1 puff into the lungs as needed.      . nitroGLYCERIN (NITROSTAT) 0.4 MG SL tablet Place 0.4 mg under the tongue every 5 (five)  minutes as needed. For chest pain       No current facility-administered medications for this visit.    Socially, he is married has one child 2 grandchildren and 2 great-grandchildren. There is no tobacco or alcohol use.  ROS General: Negative; No fevers, chills, or night sweats;  HEENT: Positive for cataracts; No changes in hearing, sinus congestion, difficulty swallowing Pulmonary: Negative;  No cough, wheezing, shortness of breath, hemoptysis Cardiovascular: See history of present illness; No chest pain, presyncope, syncope, palpatations GI: Positive for GERD on ranitidine; No nausea, vomiting, diarrhea, or abdominal pain GU: Negative; No dysuria, hematuria, or difficulty voiding Musculoskeletal: Negative; no myalgias, joint pain, or weakness Hematologic/Oncology: Negative; no easy bruising, bleeding Endocrine: Positive for hypothyroidism, on Synthroid replacement no heat/cold intolerance; no diabetes Neuro: Negative; no changes in balance, headaches Skin: Negative; No rashes or skin lesions Psychiatric: Negative; No behavioral problems, depression Sleep: Negative; No snoring, daytime sleepiness, hypersomnolence, bruxism, restless legs, hypnogognic hallucinations, no cataplexy Other comprehensive 14 point system review is negative.  PE BP 132/82  Pulse 50  Ht '5\' 8"'  (1.727 m)  Wt 203 lb (92.08 kg)  BMI 30.87 kg/m2  General: Alert, oriented, no distress.  HEENT: Normocephalic, atraumatic. Pupils round and reactive; sclera anicteric; no lid lag. Atrophic muscles intact. No xanthelasmas. Nose without nasal septal hypertrophy Mouth/Parynx benign; Mallinpatti scale 3 Neck: No JVD, no carotid bruits with normal carotid upstroke Chest wall: Nontender to palpation Lungs: clear to ausculatation and percussion; no wheezing or rales Heart: RRR, s1 s2 normal 2/6 sem, unchanged, no S3, gallop; no diastolic murmur.  No rubs, thrills or heaves Abdomen: soft, nontender; no hepatosplenomehaly,  BS+; abdominal aorta nontender and not dilated by palpation. Mild central adiposity Pulses 2+ Extremities: Trivial residual edema; no clubbing cyanosis, Homan's sign negative  Neurologic: grossly nonfocal Psychologic: Normal affect and mood  ECG (independent read by me): Sinus bradycardia at 50 bpm.  No ectopy.  PR interval 196 msec,   Borderline LVH.  Prior June 2015 ECG (independently read by me): Sinus bradycardia 52 beats per minute.  Borderline first degree AV block with a PR interval of 200 ms.  December 2014 ECG (independently read by me): Sinus rhythm at 54 beats per minute. No ectopy. Normal intervals.  ECG from 07/08/2013 : reveals sinus rhythm at 57 beats per minute. QTc interval 453 ms.  LABS:  BMET    Component Value Date/Time   NA 138 01/18/2012 1500   K 3.7 01/18/2012 1500   CL 100 01/18/2012 1500   CO2 28 01/18/2012 1500   GLUCOSE 223* 01/18/2012 1500   BUN 23 01/18/2012 1500   CREATININE 1.22 01/18/2012 1500   CALCIUM 9.1 01/18/2012 1500   GFRNONAA 59* 01/18/2012 1500   GFRAA 68* 01/18/2012 1500     Hepatic Function Panel  No results found for this basename: prot,  albumin,  ast,  alt,  alkphos,  bilitot,  bilidir,  ibili     CBC    Component Value Date/Time   WBC 9.1 01/19/2012 0530   RBC 3.41* 01/19/2012 0530   HGB 10.1* 01/19/2012 0530   HCT 30.1* 01/19/2012 0530   PLT 190 01/19/2012 0530   MCV 88.3 01/19/2012 0530   MCH 29.6 01/19/2012 0530   MCHC 33.6 01/19/2012 0530   RDW 14.0 01/19/2012 0530     BNP No results found for this basename: probnp    Lipid Panel  No results found for this basename: chol,  trig,  hdl,  cholhdl,  vldl,  ldlcalc     RADIOLOGY: No results found.    ASSESSMENT AND PLAN:   Mr. Bretado is 19 years status post CABG revascularization surgery as has a documented occluded LIMA. He previously undergone intervention both to his proximal LAD extending into the left main with stenting and stenting of his mid LAD. In June 2013 he  developed severe in-stent restenosis of his  LAD stent and a DES stent was in place at that time. We also went through the stent struts in the left main and a PTCA of the distal circumflex. He did have a mild/moderate disease in his PDA after his SVG to his PDA insertion. His last nuclear perfusion study in June after experiencing some vague episodes of chest pain was low risk and showed distal anteroseptal scar with the extent of 10% without associated ischemia. He did sustain trauma leading to injury of his brain.  He has recovered from his head trauma. Presently, his blood pressure is well controlled on his current medical regimen, consisting of amlodipine, benazepril,beta blocker, and diuretic therapy.  He continues to be bradycardic and I will now further reduce his Toprol from 100 mg to just 50 mg in the morning.  He feels that he is significantly recovered following his head trauma and his memory has returned.  He now is on pravastatin 80 mg.  Target LDL is less than 70.  If on subsequent testing.  He is not at goal.  I would recommend changing him to a more aggressive statin and adding Zetia to this if necessary.  He is diabetic and tolerating Januvia in addition to his NovoLog and Lantus insulin.  He denies recent GERD symptoms on ranitidine.  I will see him in 6 months for reevaluation or sooner if problem arise.   Time spent: 25 minutes  Troy Sine, MD, [facility]  06/05/2014 3:00 PM

## 2014-06-18 DIAGNOSIS — E1165 Type 2 diabetes mellitus with hyperglycemia: Secondary | ICD-10-CM | POA: Diagnosis not present

## 2014-06-18 DIAGNOSIS — S61432A Puncture wound without foreign body of left hand, initial encounter: Secondary | ICD-10-CM | POA: Diagnosis not present

## 2014-06-18 DIAGNOSIS — L0390 Cellulitis, unspecified: Secondary | ICD-10-CM | POA: Diagnosis not present

## 2014-06-21 DIAGNOSIS — L02512 Cutaneous abscess of left hand: Secondary | ICD-10-CM | POA: Diagnosis not present

## 2014-06-21 DIAGNOSIS — Z6832 Body mass index (BMI) 32.0-32.9, adult: Secondary | ICD-10-CM | POA: Diagnosis not present

## 2014-06-21 DIAGNOSIS — S61442A Puncture wound with foreign body of left hand, initial encounter: Secondary | ICD-10-CM | POA: Diagnosis not present

## 2014-06-21 DIAGNOSIS — M79642 Pain in left hand: Secondary | ICD-10-CM | POA: Diagnosis not present

## 2014-06-21 DIAGNOSIS — M795 Residual foreign body in soft tissue: Secondary | ICD-10-CM | POA: Diagnosis not present

## 2014-07-15 ENCOUNTER — Encounter (HOSPITAL_COMMUNITY): Payer: Self-pay | Admitting: Cardiovascular Disease

## 2014-07-16 DIAGNOSIS — Z23 Encounter for immunization: Secondary | ICD-10-CM | POA: Diagnosis not present

## 2014-07-27 DIAGNOSIS — Z6831 Body mass index (BMI) 31.0-31.9, adult: Secondary | ICD-10-CM | POA: Diagnosis not present

## 2014-07-27 DIAGNOSIS — I10 Essential (primary) hypertension: Secondary | ICD-10-CM | POA: Diagnosis not present

## 2014-07-27 DIAGNOSIS — E1129 Type 2 diabetes mellitus with other diabetic kidney complication: Secondary | ICD-10-CM | POA: Diagnosis not present

## 2014-07-27 DIAGNOSIS — E039 Hypothyroidism, unspecified: Secondary | ICD-10-CM | POA: Diagnosis not present

## 2014-07-27 DIAGNOSIS — Z79899 Other long term (current) drug therapy: Secondary | ICD-10-CM | POA: Diagnosis not present

## 2014-07-27 DIAGNOSIS — E785 Hyperlipidemia, unspecified: Secondary | ICD-10-CM | POA: Diagnosis not present

## 2014-07-27 DIAGNOSIS — I2510 Atherosclerotic heart disease of native coronary artery without angina pectoris: Secondary | ICD-10-CM | POA: Diagnosis not present

## 2014-08-05 ENCOUNTER — Encounter: Payer: Self-pay | Admitting: Endocrinology

## 2014-08-05 DIAGNOSIS — E11329 Type 2 diabetes mellitus with mild nonproliferative diabetic retinopathy without macular edema: Secondary | ICD-10-CM | POA: Diagnosis not present

## 2014-08-13 ENCOUNTER — Telehealth: Payer: Self-pay | Admitting: Endocrinology

## 2014-08-16 NOTE — Telephone Encounter (Signed)
error 

## 2014-08-19 ENCOUNTER — Ambulatory Visit: Payer: Medicare Other | Admitting: Endocrinology

## 2014-08-24 ENCOUNTER — Other Ambulatory Visit: Payer: Self-pay | Admitting: Cardiovascular Disease

## 2014-08-26 ENCOUNTER — Ambulatory Visit (INDEPENDENT_AMBULATORY_CARE_PROVIDER_SITE_OTHER): Payer: Medicare Other | Admitting: Endocrinology

## 2014-08-26 ENCOUNTER — Encounter: Payer: Self-pay | Admitting: Endocrinology

## 2014-08-26 VITALS — BP 124/80 | HR 52 | Temp 97.7°F | Ht 68.0 in | Wt 203.0 lb

## 2014-08-26 DIAGNOSIS — IMO0002 Reserved for concepts with insufficient information to code with codable children: Secondary | ICD-10-CM | POA: Insufficient documentation

## 2014-08-26 DIAGNOSIS — E1165 Type 2 diabetes mellitus with hyperglycemia: Secondary | ICD-10-CM

## 2014-08-26 DIAGNOSIS — E785 Hyperlipidemia, unspecified: Secondary | ICD-10-CM

## 2014-08-26 DIAGNOSIS — G629 Polyneuropathy, unspecified: Secondary | ICD-10-CM

## 2014-08-26 DIAGNOSIS — I10 Essential (primary) hypertension: Secondary | ICD-10-CM

## 2014-08-26 DIAGNOSIS — E1142 Type 2 diabetes mellitus with diabetic polyneuropathy: Secondary | ICD-10-CM | POA: Diagnosis not present

## 2014-08-26 MED ORDER — METFORMIN HCL ER 750 MG PO TB24: 1500.0000 mg | ORAL_TABLET | Freq: Every day | ORAL | Status: DC

## 2014-08-26 NOTE — Patient Instructions (Signed)
METformin er 750 mg for 3-4 days and then 2 daily  Please check blood sugars at least half the time about 2 hours after any meal and 3-4  times per week on waking up. Please bring blood sugar monitor to each visit  Take Novolog BEFORE EACH MEAL, 10 units for small meal and 15 for large  Walking regularly as tolerated

## 2014-08-26 NOTE — Progress Notes (Signed)
Patient ID: Roberto Haley, male   DOB: 1940/09/23, 74 y.o.   MRN: [id number]           Reason for Appointment: Consultation for Type 2 Diabetes  Referring physician: Delena Bali  History of Present Illness:          Diagnosis: Type 2 diabetes mellitus, date of diagnosis: 1996?       Past history:  He is unclear about when his diabetes was diagnosed.  Initially he thinks his blood sugars were mildly increased and he gradually got higher. Was on Metformin for quite some time and this was probably discontinued only when he went on insulin in 2014 He is not sure if he has taken any other oral medications and not clear when he was started on Januvia, possibly 1-2 years ago  Recent history: Insulin was started in 2014 when he was in rehabilitation after his fall He thinks his blood sugars were not increased before that with taking metformin However since then his blood sugars have been difficult control despite progressively increasing his insulin doses He has been taking Lantus once a day which has been increased over the last few months from dose of 35 units to 60 units for the last 2-3 weeks He is taking NovoLog but only taking it after lunch and supper.  He takes the insulin up to one hour after eating Did not take any NovoLog at breakfast including today Also he checks his blood sugar only in the morning usually He is not clear what his blood sugar ranges have been, mostly over 200 and today was 293 Glucose in the office is 363 random today, has not eaten since breakfast       Oral hypoglycemic drugs the patient is taking are: Januvia     Side effects from medications have been: none INSULIN regimen is described as: Lantus 60 units, Novolog 15 pcl and hs  Compliance with the medical regimen: Fair Hypoglycemia:   never  Glucose monitoring:  done one time a day in am         Glucometer: One Touch Ultra.      Blood Glucose readings by recall  PREMEAL Breakfast Lunch Dinner Bedtime  Overall    Glucose range: 160-300      Median:        Self-care: The diet that the patient has been following is: tries to limit bread. Has hs snacks at times    Meals: 2-3 meals per day. Breakfast is hash browns, eggs and toast.  Lunch is usually a salad.  Has mixed meal at dinner with some meat and salad.  Snacks are usually fruit           Exercise: none, he thinks he stays relatively active         Dietician visit, most recent:years ago.               Weight history: 192-230  Wt Readings from Last 3 Encounters:  08/26/14 203 lb (92.08 kg)  06/02/14 203 lb (92.08 kg)  01/25/14 209 lb 4.8 oz (94.938 kg)    Glycemic control: A1c 12.8 in 6/15 and 11.7 in 12/15   No results found for: HGBA1C Lab Results  Component Value Date   CREATININE 1.22 01/18/2012   Last LDL 75.  Microalbumin/creatinine ratio was 153 in 12/15 Creatinine 1.32 in 12/15    Medication List       This list is accurate as of: 08/26/14  2:27 PM.  Always use  your most recent med list.               amLODipine 5 MG tablet  Commonly known as:  NORVASC  Take 1 tablet by mouth daily.     aspirin 81 MG tablet  Take 81 mg by mouth daily.     benazepril 40 MG tablet  Commonly known as:  LOTENSIN  Take 40 mg by mouth daily.     cholecalciferol 1000 UNITS tablet  Commonly known as:  VITAMIN D  Take 1,000 Units by mouth daily.     fluticasone 50 MCG/ACT nasal spray  Commonly known as:  FLONASE  2 sprays daily.     insulin aspart 100 UNIT/ML FlexPen  Commonly known as:  NOVOLOG  Inject 15 Units into the skin 2 (two) times daily.     isosorbide mononitrate 30 MG 24 hr tablet  Commonly known as:  IMDUR  Take 30 mg by mouth daily.     LANTUS SOLOSTAR 100 UNIT/ML Solostar Pen  Generic drug:  Insulin Glargine  Inject 55 Units into the skin daily at 10 pm.     metoprolol succinate 100 MG 24 hr tablet  Commonly known as:  TOPROL-XL  Take 50 mg by mouth daily.     multivitamin with minerals Tabs tablet  Take 1  tablet by mouth daily.     nitroGLYCERIN 0.4 MG SL tablet  Commonly known as:  NITROSTAT  Place 0.4 mg under the tongue every 5 (five) minutes as needed. For chest pain     NOVOFINE AUTOCOVER 30G X 8 MM Misc  Generic drug:  Insulin Pen Needle     oxybutynin 5 MG tablet  Commonly known as:  DITROPAN  Take 1 tablet by mouth daily.     polyethylene glycol powder powder  Commonly known as:  GLYCOLAX/MIRALAX     potassium chloride SA 20 MEQ tablet  Commonly known as:  K-DUR,KLOR-CON  Take 1 tablet by mouth daily.     pravastatin 80 MG tablet  Commonly known as:  PRAVACHOL  Take 80 mg by mouth daily.     ranitidine 150 MG tablet  Commonly known as:  ZANTAC  Take 150 mg by mouth 2 (two) times daily.     sitaGLIPtin 100 MG tablet  Commonly known as:  JANUVIA  Take 100 mg by mouth daily.     SYNTHROID 75 MCG tablet  Generic drug:  levothyroxine  1 tablet daily.     torsemide 20 MG tablet  Commonly known as:  DEMADEX  Take 20 mg by mouth daily.     torsemide 20 MG tablet  Commonly known as:  DEMADEX  TAKE 1 TABLET BY MOUTH TWICE DAILY     VENTOLIN HFA 108 (90 BASE) MCG/ACT inhaler  Generic drug:  albuterol  Inhale 1 puff into the lungs as needed.        Allergies:  Allergies  Allergen Reactions  . Tramadol Nausea And Vomiting  . Codeine Rash    Past Medical History  Diagnosis Date  . Coronary artery disease   . Diabetes mellitus   . Groin hematoma 02/19/12    RIGHT  . Hyperlipidemia   . Hypertension   . Obesity   . PVC (premature ventricular contraction)   . Chronic kidney disease     CKD STAGE 3;  CHRONIC RENAL INSUFFICIENCY    Past Surgical History  Procedure Laterality Date  . Coronary artery bypass graft  1997    LIMA to the  LAD and vein to the RCA;  . Cardiac catheterization  01/14/12    LV FXN EF 50-55% W/MILD DISTAL INFERIOR HYPOKINESIS; PCI: LAD PTCA/STENT W/NEW 2.75X22 RESOLUTE DES IN MID LAD POST DIALTED TO 3.08 TO 2.97 TAPER: 99%-80% TO 0;  LCX: PTCA VIA LM STENT W/2.25X12 SPRINTER BALLOON: 90%-5; LAD: PATENT PROXIMAL STENT EXTENDING INTO LM W/30-40% SMOOTH NARROWING IN DISTAL PORTION OF STENT; 99% IN STENT RESTENOSIS OF MID LAD STENT   . Lower venous duplex  02/05/12    ESSENTIALLY NORMAL RIGHT LOWER EXTRIMTY VENOUS DUPLEX DOPPLER EVALUATION  . Lower arterial doppler  01/24/12    ESSENTIALLY NORMAL RIGHT GROIN DUPLEX EVALUATION S/P THROMBIN INJECTION  . Nm myoview ltd  01/09/12    HIGH RISK SCAN. COMPARED TO PREVIOUS STUDY, THERE IS NOW ISCHEMIA PRESENT. ABNORMAL MYOCARDIAL PERFUSION STUDY. THERE IS NEW MILD INFEROLATERAL ISCHEMIA TOWARDS THE BASE; PT TO FOLLOW UP WITH DR. TK  . Doppler echocardiography  01/09/12    LV NORMAL IN SIZE, NORMAL WAL THICKNESS, EF 50-55%; MILD POSTERIOR WALL HYOPKINESIS, THERE IS MILD INFERIOR WALL HYPOKINESIS  . Left heart catheterization with coronary/graft angiogram N/A 01/14/2012    Procedure: LEFT HEART CATHETERIZATION WITH Beatrix Fetters;  Surgeon: Troy Sine, MD;  Location: [facility] CATH LAB;  Service: Cardiovascular;  Laterality: N/A;  . Percutaneous coronary stent intervention (pci-s) N/A 01/14/2012    Procedure: PERCUTANEOUS CORONARY STENT INTERVENTION (PCI-S);  Surgeon: Troy Sine, MD;  Location: [facility] CATH LAB;  Service: Cardiovascular;  Laterality: N/A;    No family history on file.  Social History:  reports that he has never smoked. He has never used smokeless tobacco. He reports that he does not drink alcohol or use illicit drugs.    Review of Systems       Vision is normal. Most recent eye exam was 1/16 at [facility] , no known retinopathy       Lipids: His last LDL was 75 and is taking pravastatin 80 mg      No results found for: CHOL, HDL, LDLCALC, LDLDIRECT, TRIG, CHOLHDL                Skin: No rash or infections     Thyroid:  No  unusual fatigue.  He thinks he has been on thyroid supplements for several years and does not know why.  Did not start feeling any  better with taking thyroid medication initially.  His last TSH in 12/15 was 1.2 from PCP      The blood pressure has been Controlled with Lotensin, metoprolol and amlodipine     No swelling of feet  Recently, has been controlled with Demadex.     No shortness of breath or chest tightness on exertion. Has history of CAD treated with bypass surgery        Bowel habits: Mild chronic constipation present      Left foot joint  pains  At times.       No calf pain On walking          He has a 2 year history of Numbness in his feet,  no significant tingling or burning in feet   .     He may have difficulty with balance at times        He has had difficulty with bladder control or nocturia treated with oxybutynin   Physical Examination:  BP 124/80 mmHg  Pulse 52  Temp(Src) 97.7 F (36.5 C) (Oral)  Ht 5\' 8"  (1.727 m)  Wt 203 lb (92.08 kg)  BMI 30.87 kg/m2  SpO2 97%  GENERAL:         Patient has mild generalized obesity.  looks well, well groomed  HEENT:         Eye exam shows normal external appearance. Fundus exam shows no retinopathy. Oral exam shows normal mucosa .  NECK:         General:  Neck exam shows no lymphadenopathy. Carotids are normal to palpation and no bruit heard.  Thyroid is not enlarged and no nodules felt.   LUNGS:         Chest is symmetrical. Lungs are clear to auscultation.Marland Kitchen   HEART:         Heart sounds:  S1 and S2 are normal. No murmurs or clicks heard., no S3 or S4.   ABDOMEN:   There is no distention present. Liver and spleen are not palpable. No other mass or tenderness present.  EXTREMITIES:     There is no edema. No skin lesions present.Marland Kitchen  NEUROLOGICAL:   Vibration sense is  moderately  reduced in toes. Ankle jerks are absent bilaterally.          Diabetic foot exam shows decreased monofilament sensation in the toes and plantar surfaces, no skin lesions or ulcers on the feet and normal pedal pulses MUSCULOSKELETAL:       There is no enlargement or  deformity of the joints. Spine is normal to inspection.Marland Kitchen   SKIN:       No rash or lesions of concern.        ASSESSMENT:  Diabetes type 2, uncontrolled  With obesity    The patient has significant insulin resistance as he is taking about 90 units of insulin a day with consistently poor control including fasting glucose. Currently only on Januvia in addition to his Basal bolus insulin  Regimen which is unlikely to be helping much. He is not taking his NovoLog in a physiological mother and is generally taking it after his light lunch and evening meal and not at breakfast. Glucose in the office is 363 random, probably higher because of not taking his real-time insulin for his breakfast today He does not appear to have much understanding of insulin especially mealtime doses. Currently not doing any postprandial readings and these are likely to be significantly high also. For his insulin resistance he may respond to adding metformin to his insulin; probably not a candidate for Actos first line because of history of edema He does not have much education regarding day-to-day management of diabetes including meal planning. He is not motivated to exercise.  COMPLICATIONS: peripheral neuropathy with mild sensory loss.  Coronary artery disease   Hyperlipidemia: LDL still 75 and his target should be under 70  Hypertension: Appears well controlled Mild sinus bradycardia secondary to metoprolol.  Probable hypothyroidism by history the last TSH normal on low dose of 75 g  Borderline renal function with creatinine 1.3, not related to diabetic nephropathy  PLAN:   Start walking either outside or on treadmill.  Change NovoLog timing from after meals to at least 10-15 minutes before each meal.  He may need to take as much as 20 units at his main meal especially if postprandial readings are over 200.  Detailed instructions given on this as outlined in patient instructions.  Increase Lantus to at least  65 for now.  May consider increasing this further or using Toujeo for better 24 hour effect  Trial of metformin ER  starting with 750 mg daily and then going up to 1500 mg. discussed benefits, possible side effects and dosage titration.  This should be safe considering his current renal function  May stop the Januvia when finished as it is unlikely to be helping control  Consultation with dietitian for detailed meal planning and general diabetes education.  He can do somewhat better with his snacks and higher fat meals  Consider adding Victoza to help with overall diabetes control especially postprandial and also facilitate weight loss.  Follow-up in 3 weeks.  Start walking, discussed options to do this in the winter months also   Counseling time over 50% of today's 60 minute visit  Patient Instructions  METformin er 750 mg for 3-4 days and then 2 daily  Please check blood sugars at least half the time about 2 hours after any meal and 3-4  times per week on waking up. Please bring blood sugar monitor to each visit  Take Novolog BEFORE EACH MEAL, 10 units for small meal and 15 for large  Walking regularly as tolerated     Shamal Stracener 08/26/2014, 2:27 PM   Note: This office note was prepared with Dragon voice recognition system technology. Any transcriptional errors that result from this process are unintentional.

## 2014-08-27 DIAGNOSIS — E1142 Type 2 diabetes mellitus with diabetic polyneuropathy: Secondary | ICD-10-CM | POA: Insufficient documentation

## 2014-08-27 DIAGNOSIS — I10 Essential (primary) hypertension: Secondary | ICD-10-CM | POA: Insufficient documentation

## 2014-08-27 DIAGNOSIS — E785 Hyperlipidemia, unspecified: Secondary | ICD-10-CM | POA: Insufficient documentation

## 2014-09-30 ENCOUNTER — Encounter: Payer: Self-pay | Admitting: Dietician

## 2014-09-30 ENCOUNTER — Encounter: Payer: Medicare Other | Attending: Endocrinology | Admitting: Dietician

## 2014-09-30 ENCOUNTER — Encounter: Payer: Self-pay | Admitting: Endocrinology

## 2014-09-30 ENCOUNTER — Ambulatory Visit (INDEPENDENT_AMBULATORY_CARE_PROVIDER_SITE_OTHER): Payer: Medicare Other | Admitting: Endocrinology

## 2014-09-30 VITALS — Ht 68.0 in | Wt 202.0 lb

## 2014-09-30 VITALS — BP 122/78 | HR 54 | Temp 98.7°F | Ht 68.0 in | Wt 204.0 lb

## 2014-09-30 DIAGNOSIS — E1165 Type 2 diabetes mellitus with hyperglycemia: Secondary | ICD-10-CM

## 2014-09-30 DIAGNOSIS — Z6830 Body mass index (BMI) 30.0-30.9, adult: Secondary | ICD-10-CM | POA: Diagnosis not present

## 2014-09-30 DIAGNOSIS — IMO0002 Reserved for concepts with insufficient information to code with codable children: Secondary | ICD-10-CM

## 2014-09-30 DIAGNOSIS — Z713 Dietary counseling and surveillance: Secondary | ICD-10-CM | POA: Insufficient documentation

## 2014-09-30 DIAGNOSIS — I10 Essential (primary) hypertension: Secondary | ICD-10-CM | POA: Diagnosis not present

## 2014-09-30 NOTE — Progress Notes (Signed)
  Medical Nutrition Therapy:  Appt start time: 1500 end time:  1600.  Assessment:  Primary concerns today: Patient is here alone today.  He wishes to get better control of his blood sugar.  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   Hx includes type 2 diabetes, renal insufficiency, HTN.     Weight today 202 lbs.  UBW:  195 lbs.  States that he would like to lose to 175 lbs.    Preferred Learning Style:   No preference indicated  Learning Readiness:   Ready  Change in progress  MEDICATIONS: see list   DIETARY INTAKE: Baked or grilled meat 24-hr recall:  B (AM): oatmeal with sweet and low and fruit (pear or 1/2 banana) Snk ( AM):   L ( PM): restaurant:  Side salad with small amount of cheese Snk ( PM):  D ( PM): vegetables, sugar free jello, meat at times, occasional starch Snk ( PM): ice cream (No sugar [place] or no sugar added ice cream Beverages: unsweetened tea and water  Usual physical activity: Neuropathy pain interferes with walking.  Walked 5 miles per day years ago.  Occasional problems with balance.  Estimated energy needs: 1600 calories 170 g carbohydrates 94 g protein 50 g fat  Progress Towards Goal(s):  In progress.   Nutritional Diagnosis:  NB-1.1 Food and nutrition-related knowledge deficit As related to balance of carbohydrate, protein, and fat.  As evidenced by diet hx and patient report.    Intervention:  Nutrition counseling and diabetes education initiated. Discussed Carb Counting by food group as method of portion control, reading food labels, and benefits of increased activity. Also discussed basic physiology of Diabetes, target BG ranges pre and post meals, and A1c.   Aim for 3 Carb Choices per meal (45 grams) +/- 1 either way  Aim for 0-1 Carbs per snack if hungry  Include protein in moderation with your meals and  snacks Consider reading food labels for Total Carbohydrate and Fat Grams of foods Consider walking for 10 minutes after a meal daily. Be sure to have balanced meals with protein at each meal.   Teaching Method Utilized:  Visual Auditory Hands on  Handouts given during visit include:  Meal plan card  Label reading  Snack list  Barriers to learning/adherence to lifestyle change: none  Demonstrated degree of understanding via:  Teach Back   Monitoring/Evaluation:  Dietary intake, exercise, label reading, and body weight prn.

## 2014-09-30 NOTE — Progress Notes (Signed)
Patient ID: Roberto Haley, male   DOB: 1941-04-01, 74 y.o.   MRN: [id number]           Reason for Appointment: Follow-up for Type 2 Diabetes  Referring physician: Delena Bali  History of Present Illness:          Diagnosis: Type 2 diabetes mellitus, date of diagnosis: 1996?       Past history:  He is unclear about when his diabetes was diagnosed.  Initially he thinks his blood sugars were mildly increased and he gradually got higher. Was on Metformin for quite some time and this was probably discontinued only when he went on insulin in 2014 He is not sure if he has taken any other oral medications and not clear when he was started on Januvia, possibly 1-2 years ago Insulin was started in 2014 when he was in rehabilitation after his fall  Recent history:  He was seen in the office for consultation regarding poor control in 08/2014; A1c was 11.7 Because of his insulin resistance and significantly high readings he was told to start metformin ER 750 mg He has done this but did not increase the dose to 2 tablets daily as directed He was also asked to start checking blood sugars after meals but he has not done this Also his current monitor is being shared with his wife and not clear which readings are his.  He did not bring a record of his home blood sugars He is checking his blood sugar early morning when he wakes up and these appear to be mostly over 200  Also the following changes were recommended with his insulin: Lantus 65 and NovoLog 10-15 units before meals consistently He has not increased his Lantus; also has taken the same amount of NovoLog 15 units However he has stopped taking the NovoLog at bedtime which he was doing and also has taken the NovoLog before eating instead of after  He was seen by the dietitian today for me planning education Although he was asked to start walking he says he has difficulty doing this because of lower extremity pain       Oral hypoglycemic drugs the  patient is taking are: Metformin ER 750 mg daily      Side effects from medications have been: none INSULIN regimen is described as: Lantus 60 units, Novolog 15 pcl  Compliance with the medical regimen: Fair Hypoglycemia:   never  Glucose monitoring:  done one time a day in am, recent results range from 192-357, median 262           Glucometer: One Touch Ultra.       Self-care: The diet that the patient has been following is: tries to limit bread. Has hs snacks at times    Meals: 2-3 meals per day. Breakfast is hash browns, eggs and toast.  Lunch is usually a salad.   Has mixed meal at dinner with some meat and salad.  Snacks are usually fruit           Exercise: none, he thinks he stays relatively active         Dietician visit last: 08/30/14                Weight history: 192-230  Wt Readings from Last 3 Encounters:  09/30/14 204 lb (92.534 kg)  09/30/14 202 lb (91.627 kg)  08/26/14 203 lb (92.08 kg)    Glycemic control: A1c 12.8 in 6/15 and 11.7 in 12/15   No  results found for: HGBA1C Lab Results  Component Value Date   CREATININE 1.22 01/18/2012   Last LDL 75.  Microalbumin/creatinine ratio was 153 in 12/15 Creatinine 1.32 in 12/15    Medication List       This list is accurate as of: 09/30/14  8:54 PM.  Always use your most recent med list.               amLODipine 5 MG tablet  Commonly known as:  NORVASC  Take 1 tablet by mouth daily.     aspirin 81 MG tablet  Take 81 mg by mouth daily.     benazepril 40 MG tablet  Commonly known as:  LOTENSIN  Take 40 mg by mouth daily.     cholecalciferol 1000 UNITS tablet  Commonly known as:  VITAMIN D  Take 1,000 Units by mouth daily.     fluticasone 50 MCG/ACT nasal spray  Commonly known as:  FLONASE  2 sprays daily.     insulin aspart 100 UNIT/ML FlexPen  Commonly known as:  NOVOLOG  Inject 15 Units into the skin 2 (two) times daily.     isosorbide mononitrate 30 MG 24 hr tablet  Commonly known as:  IMDUR    Take 30 mg by mouth daily.     LANTUS SOLOSTAR 100 UNIT/ML Solostar Pen  Generic drug:  Insulin Glargine  Inject 55 Units into the skin daily at 10 pm.     metFORMIN 750 MG 24 hr tablet  Commonly known as:  GLUCOPHAGE-XR  Take 2 tablets (1,500 mg total) by mouth daily with breakfast.     metoprolol succinate 100 MG 24 hr tablet  Commonly known as:  TOPROL-XL  Take 50 mg by mouth daily.     multivitamin with minerals Tabs tablet  Take 1 tablet by mouth daily.     nitroGLYCERIN 0.4 MG SL tablet  Commonly known as:  NITROSTAT  Place 0.4 mg under the tongue every 5 (five) minutes as needed. For chest pain     NOVOFINE AUTOCOVER 30G X 8 MM Misc  Generic drug:  Insulin Pen Needle     oxybutynin 5 MG tablet  Commonly known as:  DITROPAN  Take 1 tablet by mouth daily.     polyethylene glycol powder powder  Commonly known as:  GLYCOLAX/MIRALAX     potassium chloride SA 20 MEQ tablet  Commonly known as:  K-DUR,KLOR-CON  Take 1 tablet by mouth daily.     pravastatin 80 MG tablet  Commonly known as:  PRAVACHOL  Take 80 mg by mouth daily.     ranitidine 150 MG tablet  Commonly known as:  ZANTAC  Take 150 mg by mouth 2 (two) times daily.     SYNTHROID 75 MCG tablet  Generic drug:  levothyroxine  1 tablet daily.     torsemide 20 MG tablet  Commonly known as:  DEMADEX  Take 20 mg by mouth daily.     torsemide 20 MG tablet  Commonly known as:  DEMADEX  TAKE 1 TABLET BY MOUTH TWICE DAILY     VENTOLIN HFA 108 (90 BASE) MCG/ACT inhaler  Generic drug:  albuterol  Inhale 1 puff into the lungs as needed.     vitamin C 500 MG tablet  Commonly known as:  ASCORBIC ACID  Take 500 mg by mouth daily.        Allergies:  Allergies  Allergen Reactions  . Tramadol Nausea And Vomiting  . Codeine Rash  Past Medical History  Diagnosis Date  . Coronary artery disease   . Diabetes mellitus   . Groin hematoma 02/19/12    RIGHT  . Hyperlipidemia   . Hypertension   .  Obesity   . PVC (premature ventricular contraction)   . Chronic kidney disease     CKD STAGE 3;  CHRONIC RENAL INSUFFICIENCY  . Neuropathy due to type 2 diabetes mellitus     Past Surgical History  Procedure Laterality Date  . Coronary artery bypass graft  1997    LIMA to the LAD and vein to the RCA;  . Cardiac catheterization  01/14/12    LV FXN EF 50-55% W/MILD DISTAL INFERIOR HYPOKINESIS; PCI: LAD PTCA/STENT W/NEW 2.75X22 RESOLUTE DES IN MID LAD POST DIALTED TO 3.08 TO 2.97 TAPER: 99%-80% TO 0; LCX: PTCA VIA LM STENT W/2.25X12 SPRINTER BALLOON: 90%-5; LAD: PATENT PROXIMAL STENT EXTENDING INTO LM W/30-40% SMOOTH NARROWING IN DISTAL PORTION OF STENT; 99% IN STENT RESTENOSIS OF MID LAD STENT   . Lower venous duplex  02/05/12    ESSENTIALLY NORMAL RIGHT LOWER EXTRIMTY VENOUS DUPLEX DOPPLER EVALUATION  . Lower arterial doppler  01/24/12    ESSENTIALLY NORMAL RIGHT GROIN DUPLEX EVALUATION S/P THROMBIN INJECTION  . Nm myoview ltd  01/09/12    HIGH RISK SCAN. COMPARED TO PREVIOUS STUDY, THERE IS NOW ISCHEMIA PRESENT. ABNORMAL MYOCARDIAL PERFUSION STUDY. THERE IS NEW MILD INFEROLATERAL ISCHEMIA TOWARDS THE BASE; PT TO FOLLOW UP WITH DR. TK  . Doppler echocardiography  01/09/12    LV NORMAL IN SIZE, NORMAL WAL THICKNESS, EF 50-55%; MILD POSTERIOR WALL HYOPKINESIS, THERE IS MILD INFERIOR WALL HYPOKINESIS  . Left heart catheterization with coronary/graft angiogram N/A 01/14/2012    Procedure: LEFT HEART CATHETERIZATION WITH Beatrix Fetters;  Surgeon: Troy Sine, MD;  Location: [facility] CATH LAB;  Service: Cardiovascular;  Laterality: N/A;  . Percutaneous coronary stent intervention (pci-s) N/A 01/14/2012    Procedure: PERCUTANEOUS CORONARY STENT INTERVENTION (PCI-S);  Surgeon: Troy Sine, MD;  Location: [facility] CATH LAB;  Service: Cardiovascular;  Laterality: N/A;    Family History  Problem Relation Age of Onset  . Cancer Father   . Diabetes Paternal Aunt   . Heart disease Neg Hx     Social  History:  reports that he has never smoked. He has never used smokeless tobacco. He reports that he does not drink alcohol or use illicit drugs.    Review of Systems   Most recent eye exam was 1/16 at [facility] , no known retinopathy       Lipids: His last LDL was 75 and is taking pravastatin 80 mg      No results found for: CHOL, HDL, LDLCALC, LDLDIRECT, TRIG, CHOLHDL                Thyroid:  No  unusual fatigue.  He thinks he has been on thyroid supplements for several years and does not know why.   Did not start feeling any better with taking thyroid medication initially.   His last TSH in 12/15 was 1.2 from PCP      The blood pressure has been Controlled with Lotensin, metoprolol and amlodipine  Last foot exam done in 08/2014 showing decreased monofilament sensation in the toes and plantar surfaces, no skin lesions or ulcers on the feet and normal pedal pulses  Last serum creatinine  was 1.3  Physical Examination:  BP 122/78 mmHg  Pulse 54  Temp(Src) 98.7 F (37.1 C) (Oral)  Ht  5\' 8"  (1.727 m)  Wt 204 lb (92.534 kg)  BMI 31.03 kg/m2  SpO2 95%  No ankle edema present  ASSESSMENT:  Diabetes type 2, uncontrolled with mild obesity and history of neuropathy    The patient has has not followed instructions for his insulin doses, adjustment of mealtime doses based on meal size, timing of glucose monitoring or increasing the dose of metformin to 1500 mg He has brought his monitor today and this shows fasting readings averaging about 260. Not clear if some of the readings on his monitor are belonging to his wife  He has little understanding of basic treatment of diabetes especially insulin; was educated by dietitian today regarding meal planning Discussed with him the importance of using metformin for insulin resistance and also need for increased activity for weight loss  PLAN:   His instructions were reviewed on the printout in detail today  Start checking blood  sugars after meals as directed at least once a day  Increase Lantus by 15 units.  Given  titration sheet for adjusting Lantus every 5 days to get morning sugars are below 150 at least.  Discussed how to do this in detail  He will increase suppertime dose of NovoLog to at least 20 units  Discussed that he may need to adjust mealtime doses further if postprandial readings are consistently high  Increase metformin ER to 2 tablets  Consider changing Lantus to Toujeo if covered by insurance and he will check on this He will need to have more education done through the diabetes educator on the next visit  Counseling time over 50% of today's 25 minute visit  Patient Instructions  Metformin 750 mg 2 daily  Please check blood sugars at least half the time about 2 hours after any meal and 3-4 times per week on waking up. Please bring blood sugar monitor to each visit  Use your own meter  Take Novolog (orange) BEFORE EACH MEAL, 15 for Bfst and lunch and 20 before supper, just before eating  LANTUS 75 DAILY and adjust based on am sugar as on work sheet  Walking regularly as tolerated  May stop Januvia  Check coverage for [place] 09/30/2014, 8:54 PM   Note: This office note was prepared with Dragon voice recognition system technology. Any transcriptional errors that result from this process are unintentional.

## 2014-09-30 NOTE — Patient Instructions (Addendum)
Metformin 750 mg 2 daily  Please check blood sugars at least half the time about 2 hours after any meal and 3-4 times per week on waking up. Please bring blood sugar monitor to each visit  Use your own meter  Take Novolog (orange) BEFORE EACH MEAL, 15 for Bfst and lunch and 20 before supper, just before eating  LANTUS 75 DAILY and adjust based on am sugar as on work sheet  Walking regularly as tolerated  May stop Januvia  Check coverage for TOUJEO INSULIN

## 2014-09-30 NOTE — Patient Instructions (Signed)
Plan:  Aim for 3 Carb Choices per meal (45 grams) +/- 1 either way  Aim for 0-1 Carbs per snack if hungry  Include protein in moderation with your meals and snacks Consider reading food labels for Total Carbohydrate and Fat Grams of foods Consider walking for 10 minutes after a meal daily. Be sure to have balanced meals with protein at each meal.

## 2014-10-18 DIAGNOSIS — M79671 Pain in right foot: Secondary | ICD-10-CM | POA: Diagnosis not present

## 2014-10-18 DIAGNOSIS — Z6832 Body mass index (BMI) 32.0-32.9, adult: Secondary | ICD-10-CM | POA: Diagnosis not present

## 2014-10-18 DIAGNOSIS — M79672 Pain in left foot: Secondary | ICD-10-CM | POA: Diagnosis not present

## 2014-10-22 ENCOUNTER — Telehealth: Payer: Self-pay | Admitting: Cardiovascular Disease

## 2014-10-25 NOTE — Telephone Encounter (Signed)
Closed encounter °

## 2014-11-01 ENCOUNTER — Other Ambulatory Visit: Payer: Self-pay | Admitting: *Deleted

## 2014-11-01 ENCOUNTER — Encounter: Payer: Self-pay | Admitting: Endocrinology

## 2014-11-01 ENCOUNTER — Encounter: Payer: Medicare Other | Admitting: Nutrition

## 2014-11-01 ENCOUNTER — Ambulatory Visit (INDEPENDENT_AMBULATORY_CARE_PROVIDER_SITE_OTHER): Payer: Medicare Other | Admitting: Endocrinology

## 2014-11-01 VITALS — BP 130/72 | HR 59 | Temp 98.5°F | Resp 14 | Ht 68.0 in | Wt 204.8 lb

## 2014-11-01 DIAGNOSIS — I2510 Atherosclerotic heart disease of native coronary artery without angina pectoris: Secondary | ICD-10-CM | POA: Diagnosis not present

## 2014-11-01 DIAGNOSIS — E1165 Type 2 diabetes mellitus with hyperglycemia: Secondary | ICD-10-CM | POA: Diagnosis not present

## 2014-11-01 DIAGNOSIS — G629 Polyneuropathy, unspecified: Secondary | ICD-10-CM | POA: Diagnosis not present

## 2014-11-01 DIAGNOSIS — E785 Hyperlipidemia, unspecified: Secondary | ICD-10-CM | POA: Diagnosis not present

## 2014-11-01 DIAGNOSIS — E1142 Type 2 diabetes mellitus with diabetic polyneuropathy: Secondary | ICD-10-CM | POA: Diagnosis not present

## 2014-11-01 DIAGNOSIS — E1129 Type 2 diabetes mellitus with other diabetic kidney complication: Secondary | ICD-10-CM | POA: Diagnosis not present

## 2014-11-01 DIAGNOSIS — I10 Essential (primary) hypertension: Secondary | ICD-10-CM | POA: Diagnosis not present

## 2014-11-01 DIAGNOSIS — Z6831 Body mass index (BMI) 31.0-31.9, adult: Secondary | ICD-10-CM | POA: Diagnosis not present

## 2014-11-01 DIAGNOSIS — E039 Hypothyroidism, unspecified: Secondary | ICD-10-CM | POA: Diagnosis not present

## 2014-11-01 DIAGNOSIS — IMO0002 Reserved for concepts with insufficient information to code with codable children: Secondary | ICD-10-CM

## 2014-11-01 DIAGNOSIS — Z79899 Other long term (current) drug therapy: Secondary | ICD-10-CM | POA: Diagnosis not present

## 2014-11-01 LAB — LIPID PANEL
CHOLESTEROL: 138 mg/dL (ref 0–200)
HDL: 39 mg/dL (ref 35–70)

## 2014-11-01 LAB — BASIC METABOLIC PANEL: Creatinine: 1.6 mg/dL — AB (ref 0.6–1.3)

## 2014-11-01 LAB — HEMOGLOBIN A1C: HEMOGLOBIN A1C: 10.7 % — AB (ref 4.0–6.0)

## 2014-11-01 MED ORDER — INSULIN DEGLUDEC 100 UNIT/ML ~~LOC~~ SOPN
75.0000 [IU] | PEN_INJECTOR | Freq: Every day | SUBCUTANEOUS | Status: DC
Start: 2014-11-01 — End: 2015-01-13

## 2014-11-01 NOTE — Patient Instructions (Addendum)
Please check blood sugars at least half the time about 2 hours after any meal and 3 times per week on waking up. Please bring blood sugar monitor to each visit. Recommended blood sugar levels about 2 hours after meal is 140-180 and on waking up 90-130  Levemir 75 at bedtime and hold pen for 10 sec in skin after finishing pushing knob in  [place] will be same dose   Call sugar reading on Friday

## 2014-11-01 NOTE — Progress Notes (Signed)
Patient ID: Roberto Haley, male   DOB: October 23, 1940, 74 y.o.   MRN: [id number]           Reason for Appointment: Follow-up for Type 2 Diabetes  Referring physician: Delena Bali  History of Present Illness:          Diagnosis: Type 2 diabetes mellitus, date of diagnosis: 1996?       Past history:  He is unclear about when his diabetes was diagnosed.  Initially he thinks his blood sugars were mildly increased and he gradually got higher. Was on Metformin for quite some time and this was probably discontinued only when he went on insulin in 2014 He is not sure if he has taken any other oral medications and not clear when he was started on Januvia, possibly 1-2 years ago Insulin was started in 2014 when he was in rehabilitation after his fall  Recent history:  He was seen in the office for consultation regarding poor control in 08/2014; A1c was 11.7 Because of his insulin resistance and significantly high readings he was started on metformin ER which was increased to 1500 mg on his follow-up Insulin: He was advised to increase his dose of Lantus to 75 units on his last visit because of fasting readings being over 200 He was also asked to start checking blood sugars after meals but he has not done this He is not taking Levemir instead of Lantus because of the cost.  His insulin prescription was changed by his PCP and he does not know how long ago. The dose was supposed to be adjusted based on his fasting blood sugar trend and he was given a flowsheet for this but he has not used it and is still taking the same dose He is concerned about the fact that when he tries to do the Levemir injection some of the medication comes out at the injection site; he is rotating the sites but is probably not holding the insulin pen at the site of injection after finishing the injection process  Not clear why his blood sugars are inconsistent but they are relatively better in the last week or so NOVOLOG insulin: He was  supposed to take 20 units at suppertime but is taking only 15, however he is taking insulin before eating as directed consistently He does think that he felt a little weak last night but was also having abdominal discomfort.  No associated sweating or shakiness and did not check his blood sugar  He has not had an A1c recently but apparently had labs done with PCP today He was seen by the dietitian recently for planning education Although he was asked to start walking he says he has difficulty doing this because of lower extremity pain       Oral hypoglycemic drugs the patient is taking are: Metformin ER 750 mg, 2 daily      Side effects from medications have been: none INSULIN regimen is described as: Levemir 75 units hs, Novolog 15 tid Compliance with the medical regimen: Fair Hypoglycemia: None  Glucose monitoring:  done one time a day in am, recent results range from 98-299, median 220         Glucometer: One Touch Ultra.       Self-care: The diet that the patient has been following is: tries to limit bread. Has hs snacks at times    Meals: 2-3 meals per day. Breakfast is hash browns, eggs and toast.  Lunch is usually a salad.  Has mixed meal at dinner with some meat and salad.  Snacks are usually fruit           Exercise: none, he thinks he is starting now      Dietician visit last: 08/30/14                Weight history: 192-230  Wt Readings from Last 3 Encounters:  11/01/14 204 lb 12.8 oz (92.897 kg)  09/30/14 204 lb (92.534 kg)  09/30/14 202 lb (91.627 kg)    Glycemic control: A1c 12.8 in 6/15 and 11.7 in 12/15   No results found for: HGBA1C Lab Results  Component Value Date   CREATININE 1.22 01/18/2012   Last LDL 75.  Microalbumin/creatinine ratio was 153 in 12/15  Creatinine 1.32 in 12/15    Medication List       This list is accurate as of: 11/01/14  5:26 PM.  Always use your most recent med list.               amLODipine 5 MG tablet  Commonly known as:   NORVASC  Take 1 tablet by mouth daily.     aspirin 81 MG tablet  Take 81 mg by mouth daily.     benazepril 40 MG tablet  Commonly known as:  LOTENSIN  Take 40 mg by mouth daily.     Vitamin D (Cholecalciferol) 1000 UNITS Caps     cholecalciferol 1000 UNITS tablet  Commonly known as:  VITAMIN D  Take 1,000 Units by mouth daily.     fluticasone 50 MCG/ACT nasal spray  Commonly known as:  FLONASE  2 sprays daily.     insulin aspart 100 UNIT/ML FlexPen  Commonly known as:  NOVOLOG  Inject 15 Units into the skin 2 (two) times daily.     Insulin Degludec 100 UNIT/ML Sopn  Commonly known as:  TRESIBA FLEXTOUCH  Inject 75 Units into the skin at bedtime.     isosorbide mononitrate 30 MG 24 hr tablet  Commonly known as:  IMDUR  Take 30 mg by mouth daily.     JANUVIA 100 MG tablet  Generic drug:  sitaGLIPtin     LANTUS SOLOSTAR 100 UNIT/ML Solostar Pen  Generic drug:  Insulin Glargine  Inject 55 Units into the skin daily at 10 pm.     LEVEMIR FLEXTOUCH 100 UNIT/ML Pen  Generic drug:  Insulin Detemir     metFORMIN 750 MG 24 hr tablet  Commonly known as:  GLUCOPHAGE-XR  Take 2 tablets (1,500 mg total) by mouth daily with breakfast.     metoprolol succinate 100 MG 24 hr tablet  Commonly known as:  TOPROL-XL  Take 50 mg by mouth daily.     multivitamin with minerals Tabs tablet  Take 1 tablet by mouth daily.     nitroGLYCERIN 0.4 MG SL tablet  Commonly known as:  NITROSTAT  Place 0.4 mg under the tongue every 5 (five) minutes as needed. For chest pain     NOVOFINE AUTOCOVER 30G X 8 MM Misc  Generic drug:  Insulin Pen Needle     ONE TOUCH ULTRA TEST test strip  Generic drug:  glucose blood     oxybutynin 5 MG tablet  Commonly known as:  DITROPAN  Take 1 tablet by mouth daily.     polyethylene glycol powder powder  Commonly known as:  GLYCOLAX/MIRALAX     potassium chloride SA 20 MEQ tablet  Commonly known as:  K-DUR,KLOR-CON  Take 1  tablet by mouth daily.      pravastatin 80 MG tablet  Commonly known as:  PRAVACHOL  Take 80 mg by mouth daily.     ranitidine 150 MG tablet  Commonly known as:  ZANTAC  Take 150 mg by mouth 2 (two) times daily.     SYNTHROID 75 MCG tablet  Generic drug:  levothyroxine  1 tablet daily.     torsemide 20 MG tablet  Commonly known as:  DEMADEX  Take 20 mg by mouth daily.     torsemide 20 MG tablet  Commonly known as:  DEMADEX  TAKE 1 TABLET BY MOUTH TWICE DAILY     VENTOLIN HFA 108 (90 BASE) MCG/ACT inhaler  Generic drug:  albuterol  Inhale 1 puff into the lungs as needed.     vitamin C 500 MG tablet  Commonly known as:  ASCORBIC ACID  Take 500 mg by mouth daily.        Allergies:  Allergies  Allergen Reactions  . Tramadol Nausea And Vomiting  . Codeine Rash    Past Medical History  Diagnosis Date  . Coronary artery disease   . Diabetes mellitus   . Groin hematoma 02/19/12    RIGHT  . Hyperlipidemia   . Hypertension   . Obesity   . PVC (premature ventricular contraction)   . Chronic kidney disease     CKD STAGE 3;  CHRONIC RENAL INSUFFICIENCY  . Neuropathy due to type 2 diabetes mellitus     Past Surgical History  Procedure Laterality Date  . Coronary artery bypass graft  1997    LIMA to the LAD and vein to the RCA;  . Cardiac catheterization  01/14/12    LV FXN EF 50-55% W/MILD DISTAL INFERIOR HYPOKINESIS; PCI: LAD PTCA/STENT W/NEW 2.75X22 RESOLUTE DES IN MID LAD POST DIALTED TO 3.08 TO 2.97 TAPER: 99%-80% TO 0; LCX: PTCA VIA LM STENT W/2.25X12 SPRINTER BALLOON: 90%-5; LAD: PATENT PROXIMAL STENT EXTENDING INTO LM W/30-40% SMOOTH NARROWING IN DISTAL PORTION OF STENT; 99% IN STENT RESTENOSIS OF MID LAD STENT   . Lower venous duplex  02/05/12    ESSENTIALLY NORMAL RIGHT LOWER EXTRIMTY VENOUS DUPLEX DOPPLER EVALUATION  . Lower arterial doppler  01/24/12    ESSENTIALLY NORMAL RIGHT GROIN DUPLEX EVALUATION S/P THROMBIN INJECTION  . Nm myoview ltd  01/09/12    HIGH RISK SCAN. COMPARED TO PREVIOUS  STUDY, THERE IS NOW ISCHEMIA PRESENT. ABNORMAL MYOCARDIAL PERFUSION STUDY. THERE IS NEW MILD INFEROLATERAL ISCHEMIA TOWARDS THE BASE; PT TO FOLLOW UP WITH DR. TK  . Doppler echocardiography  01/09/12    LV NORMAL IN SIZE, NORMAL WAL THICKNESS, EF 50-55%; MILD POSTERIOR WALL HYOPKINESIS, THERE IS MILD INFERIOR WALL HYPOKINESIS  . Left heart catheterization with coronary/graft angiogram N/A 01/14/2012    Procedure: LEFT HEART CATHETERIZATION WITH Beatrix Fetters;  Surgeon: Troy Sine, MD;  Location: [facility] CATH LAB;  Service: Cardiovascular;  Laterality: N/A;  . Percutaneous coronary stent intervention (pci-s) N/A 01/14/2012    Procedure: PERCUTANEOUS CORONARY STENT INTERVENTION (PCI-S);  Surgeon: Troy Sine, MD;  Location: [facility] CATH LAB;  Service: Cardiovascular;  Laterality: N/A;    Family History  Problem Relation Age of Onset  . Cancer Father   . Diabetes Paternal Aunt   . Heart disease Neg Hx     Social History:  reports that he has never smoked. He has never used smokeless tobacco. He reports that he does not drink alcohol or use illicit drugs.    Review of  Systems   Last eye exam was 1/16 at [facility] , no known retinopathy       Lipids: His last LDL was 75 and is taking pravastatin 80 mg      No results found for: CHOL, HDL, LDLCALC, LDLDIRECT, TRIG, CHOLHDL                Thyroid:  No  unusual fatigue.  He thinks he has been on thyroid supplements for several years and does not know why.   Did not start feeling any better with taking thyroid medication initially.   His last TSH in 12/15 was 1.2 from PCP      The blood pressure has been treated with Lotensin, metoprolol and amlodipine  Last foot exam done in 08/2014 showing decreased monofilament sensation in the toes and plantar surfaces, no skin lesions or ulcers on the feet and normal pedal pulses    Physical Examination:  BP 130/72 mmHg  Pulse 59  Temp(Src) 98.5 F (36.9 C)  Resp 14  Ht 5\' 8"   (1.727 m)  Wt 204 lb 12.8 oz (92.897 kg)  BMI 31.15 kg/m2  SpO2 97%  No ankle edema present  ASSESSMENT:  Diabetes type 2, uncontrolled with mild obesity and history of neuropathy    His blood sugars are starting to improve with increasing his basal insulin and also metformin He was changed to Levemir instead of Lantus by PCP because of insurance preference However he is not liking the new FlexPen as the injection is more rapid and somewhat the insulin comes out of the injection site; discussed correct technique of doing the injection and holding the pen at the site for 10 seconds after the injection Discussed again the need for postprandial monitoring to help adjust mealtime insulin Also needs to start more regular exercise  PLAN:   His instructions were reviewed twice to make sure he understands  Start checking blood sugars after meals alternating with fasting  Use the correct technique for doing the injection with Levemir as discussed above; meanwhile he can try splitting the dose into 2 different sites  Try Tresiba when Levemir runs out as this is also covered by his insurance  He will call blood sugar readings for review on Friday to help adjust his mealtime dose  Consistent diet and exercise  Counseling time over 50% of today's 25 minute visit  Patient Instructions  Please check blood sugars at least half the time about 2 hours after any meal and 3 times per week on waking up. Please bring blood sugar monitor to each visit. Recommended blood sugar levels about 2 hours after meal is 140-180 and on waking up 90-130  Levemir 75 at bedtime and hold pen for 10 sec in skin after finishing pushing knob in  [place] will be same dose   Call sugar reading on Friday  Please check blood sugars at least half the time about 2 hours after any meal and 3 times per week on waking up. Please bring blood sugar monitor to each visit. Recommended blood sugar levels about 2 hours after  meal is 140-180 and on waking up 90-130     Aanika Defoor 11/01/2014, 5:26 PM   Note: This office note was prepared with Estate agent. Any transcriptional errors that result from this process are unintentional.

## 2014-11-04 ENCOUNTER — Encounter: Payer: Self-pay | Admitting: *Deleted

## 2014-11-17 DIAGNOSIS — Z23 Encounter for immunization: Secondary | ICD-10-CM | POA: Diagnosis not present

## 2014-11-25 ENCOUNTER — Ambulatory Visit: Payer: Medicare Other | Admitting: Cardiovascular Disease

## 2014-11-29 ENCOUNTER — Ambulatory Visit: Payer: Medicare Other | Admitting: Cardiovascular Disease

## 2014-12-02 DIAGNOSIS — I10 Essential (primary) hypertension: Secondary | ICD-10-CM | POA: Diagnosis not present

## 2014-12-02 DIAGNOSIS — E1129 Type 2 diabetes mellitus with other diabetic kidney complication: Secondary | ICD-10-CM | POA: Diagnosis not present

## 2014-12-02 DIAGNOSIS — N183 Chronic kidney disease, stage 3 (moderate): Secondary | ICD-10-CM | POA: Diagnosis not present

## 2014-12-02 DIAGNOSIS — Z6831 Body mass index (BMI) 31.0-31.9, adult: Secondary | ICD-10-CM | POA: Diagnosis not present

## 2014-12-02 DIAGNOSIS — E039 Hypothyroidism, unspecified: Secondary | ICD-10-CM | POA: Diagnosis not present

## 2014-12-13 ENCOUNTER — Encounter: Payer: Self-pay | Admitting: Endocrinology

## 2014-12-13 ENCOUNTER — Ambulatory Visit (INDEPENDENT_AMBULATORY_CARE_PROVIDER_SITE_OTHER): Payer: Medicare Other | Admitting: Endocrinology

## 2014-12-13 ENCOUNTER — Other Ambulatory Visit: Payer: Self-pay | Admitting: *Deleted

## 2014-12-13 VITALS — BP 126/64 | HR 66 | Temp 98.2°F | Resp 16 | Ht 68.0 in | Wt 203.8 lb

## 2014-12-13 DIAGNOSIS — N289 Disorder of kidney and ureter, unspecified: Secondary | ICD-10-CM

## 2014-12-13 DIAGNOSIS — E1165 Type 2 diabetes mellitus with hyperglycemia: Secondary | ICD-10-CM

## 2014-12-13 DIAGNOSIS — IMO0002 Reserved for concepts with insufficient information to code with codable children: Secondary | ICD-10-CM

## 2014-12-13 DIAGNOSIS — I10 Essential (primary) hypertension: Secondary | ICD-10-CM

## 2014-12-13 MED ORDER — INSULIN ISOPHANE HUMAN 100 UNIT/ML KWIKPEN: PEN_INJECTOR | SUBCUTANEOUS | Status: DC

## 2014-12-13 NOTE — Patient Instructions (Addendum)
Please check blood sugars at least half the time about 2 hours after any meal and 3 times per week on waking up.  Please bring blood sugar monitor to each visit.  Recommended blood sugar levels about 2 hours after meal is 140-180 and on waking up 90-130  REDUCE BENAZEPRIL TO 1/2 TAB  STOP GLIMEPERIDE  Green pen: take 50 units at night and 35 in am  NOVOLN N WILL REPLACE GREEN PEN  Humalog or Novolog 20 at bf and lunch and 25 at supper  Start exercise of any kind, see foot doctor

## 2014-12-13 NOTE — Progress Notes (Signed)
Patient ID: Roberto Haley, male   DOB: Jun 21, 1941, 74 y.o.   MRN: [id number]           Reason for Appointment: Follow-up for Type 2 Diabetes  Referring physician: Delena Bali  History of Present Illness:          Diagnosis: Type 2 diabetes mellitus, date of diagnosis: 1996?       Past history:  He is unclear about when his diabetes was diagnosed.  Initially he thinks his blood sugars were mildly increased and he gradually got higher. Was on Metformin for quite some time and this was probably discontinued only when he went on insulin in 2014 He is not sure if he has taken any other oral medications and not clear when he was started on Januvia, possibly 1-2 years ago Insulin was started in 2014 when he was in rehabilitation after his fall He was seen in the office for consultation regarding poor control in 08/2014; A1c was 11.7  Recent history:   INSULIN regimen is described as: Levemir 35 bid, Novolog 20 tid  Because of his insulin resistance and significantly high readings he was started on metformin ER which was increased to 1500 mg on his follow-up He was also asked to start checking blood sugars after meals but he has not done this He is still taking Levemir instead of the [place] that was prescribed after his last visit and not clear why he did not change He was also asked to take 15-20 units of Novolog based on his meal size at every meal consistently  Current problems identified and blood sugar patterns:  His primary care physician stopped his metformin possibly because of renal dysfunction although his GFR is not below 35   Since then his fasting blood sugars are much higher at home and he did not call to report this  His insulin dose was supposed to be 75 units of Levemir at bedtime but he is taking 30 units twice a day instead  He does not want to continue Levemir because of the cost in the donut hole    He has been told several times to check his blood sugars at different  times of the day and he still checks them only in the morning  He is still reluctant to start any kind of exercise despite reminders  Not able to lose any weight  He was seen by the dietitian  for planning education       Oral hypoglycemic drugs the patient is taking are: not on Metformin ER 750 mg, 2 daily      Side effects from medications have been: none Compliance with the medical regimen: Fair Hypoglycemia: None  Glucose monitoring:  done one time a day in am,   PRE-MEAL Breakfast Lunch Dinner Bedtime Overall  Glucose range:  246-364       Mean/median:  295          Glucometer: One Touch Ultra.       Self-care: The diet that the patient has been following is: tries to limit bread. Has hs snacks at times    Meals: 2-3 meals per day. Breakfast is hash browns, eggs and toast.  Lunch is usually a salad.   Has mixed meal at dinner with some meat and salad.  Snacks are usually fruit           Exercise:  he is not doing now      Dietician visit last: 08/30/14  Weight history:    Wt Readings from Last 3 Encounters:  12/13/14 203 lb 12.8 oz (92.443 kg)  11/01/14 204 lb 12.8 oz (92.897 kg)  09/30/14 204 lb (92.534 kg)    Glycemic control: A1c 12.8 in 6/15 and 11.7 in 12/15    Lab Results  Component Value Date   HGBA1C 10.7* 11/01/2014   Lab Results  Component Value Date   CREATININE 1.6* 11/01/2014   Last LDL 75.  Microalbumin/creatinine ratio was 153 in 12/15     Medication List       This list is accurate as of: 12/13/14  9:07 PM.  Always use your most recent med list.               amLODipine 5 MG tablet  Commonly known as:  NORVASC  Take 1 tablet by mouth daily.     aspirin 81 MG tablet  Take 81 mg by mouth daily.     atorvastatin 40 MG tablet  Commonly known as:  LIPITOR     benazepril 40 MG tablet  Commonly known as:  LOTENSIN  Take 40 mg by mouth daily.     Vitamin D (Cholecalciferol) 1000 UNITS Caps     cholecalciferol 1000  UNITS tablet  Commonly known as:  VITAMIN D  Take 1,000 Units by mouth daily.     fluticasone 50 MCG/ACT nasal spray  Commonly known as:  FLONASE  2 sprays daily.     insulin aspart 100 UNIT/ML FlexPen  Commonly known as:  NOVOLOG  Inject 20 Units into the skin 2 (two) times daily.     Insulin Degludec 100 UNIT/ML Sopn  Commonly known as:  TRESIBA FLEXTOUCH  Inject 75 Units into the skin at bedtime.     Insulin NPH (Human) (Isophane) 100 UNIT/ML Kiwkpen  Commonly known as:  HUMULIN N PEN  Inject 35 units every morning and 50 units at night     isosorbide mononitrate 30 MG 24 hr tablet  Commonly known as:  IMDUR  Take 30 mg by mouth daily.     LANTUS SOLOSTAR 100 UNIT/ML Solostar Pen  Generic drug:  Insulin Glargine  Inject 55 Units into the skin daily at 10 pm.     LEVEMIR FLEXTOUCH 100 UNIT/ML Pen  Generic drug:  Insulin Detemir  Inject 35 Units into the skin 2 (two) times daily.     metFORMIN 750 MG 24 hr tablet  Commonly known as:  GLUCOPHAGE-XR  Take 2 tablets (1,500 mg total) by mouth daily with breakfast.     metoprolol succinate 100 MG 24 hr tablet  Commonly known as:  TOPROL-XL  Take 50 mg by mouth daily.     multivitamin with minerals Tabs tablet  Take 1 tablet by mouth daily.     nitroGLYCERIN 0.4 MG SL tablet  Commonly known as:  NITROSTAT  Place 0.4 mg under the tongue every 5 (five) minutes as needed. For chest pain     NOVOFINE AUTOCOVER 30G X 8 MM Misc  Generic drug:  Insulin Pen Needle     ONE TOUCH ULTRA TEST test strip  Generic drug:  glucose blood     oxybutynin 5 MG tablet  Commonly known as:  DITROPAN  Take 1 tablet by mouth daily.     polyethylene glycol powder powder  Commonly known as:  GLYCOLAX/MIRALAX     potassium chloride SA 20 MEQ tablet  Commonly known as:  K-DUR,KLOR-CON  Take 1 tablet by mouth daily.  PREVNAR 13 Susp injection  Generic drug:  pneumococcal 13-valent conjugate vaccine     ranitidine 150 MG tablet    Commonly known as:  ZANTAC  Take 150 mg by mouth 2 (two) times daily.     SYNTHROID 75 MCG tablet  Generic drug:  levothyroxine  1 tablet daily.     torsemide 20 MG tablet  Commonly known as:  DEMADEX  Take 20 mg by mouth daily.     VENTOLIN HFA 108 (90 BASE) MCG/ACT inhaler  Generic drug:  albuterol  Inhale 1 puff into the lungs as needed.     vitamin C 500 MG tablet  Commonly known as:  ASCORBIC ACID  Take 500 mg by mouth daily.        Allergies:  Allergies  Allergen Reactions  . Tramadol Nausea And Vomiting  . Codeine Rash    Past Medical History  Diagnosis Date  . Coronary artery disease   . Diabetes mellitus   . Groin hematoma 02/19/12    RIGHT  . Hyperlipidemia   . Hypertension   . Obesity   . PVC (premature ventricular contraction)   . Chronic kidney disease     CKD STAGE 3;  CHRONIC RENAL INSUFFICIENCY  . Neuropathy due to type 2 diabetes mellitus     Past Surgical History  Procedure Laterality Date  . Coronary artery bypass graft  1997    LIMA to the LAD and vein to the RCA;  . Cardiac catheterization  01/14/12    LV FXN EF 50-55% W/MILD DISTAL INFERIOR HYPOKINESIS; PCI: LAD PTCA/STENT W/NEW 2.75X22 RESOLUTE DES IN MID LAD POST DIALTED TO 3.08 TO 2.97 TAPER: 99%-80% TO 0; LCX: PTCA VIA LM STENT W/2.25X12 SPRINTER BALLOON: 90%-5; LAD: PATENT PROXIMAL STENT EXTENDING INTO LM W/30-40% SMOOTH NARROWING IN DISTAL PORTION OF STENT; 99% IN STENT RESTENOSIS OF MID LAD STENT   . Lower venous duplex  02/05/12    ESSENTIALLY NORMAL RIGHT LOWER EXTRIMTY VENOUS DUPLEX DOPPLER EVALUATION  . Lower arterial doppler  01/24/12    ESSENTIALLY NORMAL RIGHT GROIN DUPLEX EVALUATION S/P THROMBIN INJECTION  . Nm myoview ltd  01/09/12    HIGH RISK SCAN. COMPARED TO PREVIOUS STUDY, THERE IS NOW ISCHEMIA PRESENT. ABNORMAL MYOCARDIAL PERFUSION STUDY. THERE IS NEW MILD INFEROLATERAL ISCHEMIA TOWARDS THE BASE; PT TO FOLLOW UP WITH DR. TK  . Doppler echocardiography  01/09/12    LV NORMAL  IN SIZE, NORMAL WAL THICKNESS, EF 50-55%; MILD POSTERIOR WALL HYOPKINESIS, THERE IS MILD INFERIOR WALL HYPOKINESIS  . Left heart catheterization with coronary/graft angiogram N/A 01/14/2012    Procedure: LEFT HEART CATHETERIZATION WITH Beatrix Fetters;  Surgeon: Troy Sine, MD;  Location: [facility] CATH LAB;  Service: Cardiovascular;  Laterality: N/A;  . Percutaneous coronary stent intervention (pci-s) N/A 01/14/2012    Procedure: PERCUTANEOUS CORONARY STENT INTERVENTION (PCI-S);  Surgeon: Troy Sine, MD;  Location: [facility] CATH LAB;  Service: Cardiovascular;  Laterality: N/A;    Family History  Problem Relation Age of Onset  . Cancer Father   . Diabetes Paternal Aunt   . Heart disease Neg Hx     Social History:  reports that he has never smoked. He has never used smokeless tobacco. He reports that he does not drink alcohol or use illicit drugs.    Review of Systems   Last eye exam was 1/16 at [facility] , no known retinopathy       Lipids: His last LDL was 75 and is taking pravastatin 80 mg  Lab Results  Component Value Date   CHOL 138 11/01/2014   HDL 39 11/01/2014                  Thyroid:  No  unusual fatigue.  He thinks he has been on thyroid supplements for several years and does not know why.   Did not start feeling any better with taking thyroid medication initially.   His last TSH in 12/15 was 1.2 from PCP      The blood pressure has been treated with Lotensin, metoprolol and amlodipine with good control  Last foot exam done in 08/2014 showing decreased monofilament sensation in the toes and plantar surfaces, no skin lesions or ulcers on the feet and normal pedal pulses    Physical Examination:  BP 126/64 mmHg  Pulse 66  Temp(Src) 98.2 F (36.8 C)  Resp 16  Ht 5\' 8"  (1.727 m)  Wt 203 lb 12.8 oz (92.443 kg)  BMI 30.99 kg/m2  SpO2 96%  No ankle edema present  ASSESSMENT:  Diabetes type 2, uncontrolled with mild obesity and history of  neuropathy    His blood sugars are markedly increased with his stopping metformin because of instructions from PCP.  He may have had some change in his renal function  For some reason he is taking Levemir twice a day instead of at bedtime, possibly after visiting his PCP He is still appears to be markedly insulin resistant with blood sugars averaging almost 300 in the morning recently He is also concerned about the cost of the Levemir insulin in the donut hole and is not able to get samples  Discussed with the patient that he can take the [employer] brand NPH insulin with a syringe and showed him how to draw and inject with the syringe.  However he is very reluctant to do this and wants to continue using an insulin pen He should be able to switch to [place] when he finishes his donut hole situation and this should be more effective with once a day dosage  Discussed again the need for postprandial monitoring to help adjust mealtime insulin Also needs to start more regular exercise  PLAN:   His instructions were reviewed on the printout to make sure he understands  Start checking blood sugars after meals alternating with fasting  Increase bedtime Levemir to 50 units  After Levemir finished taken switched to Humulin NPH  When starting NPH he will use the same dose of 35 units in the morning and 50 at night  He will call blood sugar readings for review at the end of the week  Check renal function again today and consider at least low dose metformin to help his insulin resistance.    Consistent diet  Start exercise bike or other exercises for improving insulin sensitivity.  He needs to see a podiatrist if having problems with heel spurs  Stop Amaryl as it isn't effective in patients with advanced insulin deficiency and insulin resistance Discussed in detail that metformin does not cause kidney problems and new guidelines are available for metformin use in renal dysfunction  Will forward  information to PCP regarding metformin being  considered safe to GFR 35  Patient Instructions  Please check blood sugars at least half the time about 2 hours after any meal and 3 times per week on waking up.  Please bring blood sugar monitor to each visit.  Recommended blood sugar levels about 2 hours after meal is 140-180 and on waking up  90-130  REDUCE BENAZEPRIL TO 1/2 TAB  STOP GLIMEPERIDE  Green pen: take 50 units at night and 35 in am  NOVOLN N WILL REPLACE GREEN PEN  Humalog or Novolog 20 at bf and lunch and 25 at supper  Start exercise of any kind, see foot doctor   Counseling time on subjects discussed above is over 50% of today's 25 minute visit   Shanda Cadotte 12/13/2014, 9:07 PM   Note: This office note was prepared with Estate agent. Any transcriptional errors that result from this process are unintentional.

## 2014-12-15 ENCOUNTER — Telehealth: Payer: Self-pay | Admitting: Endocrinology

## 2014-12-15 NOTE — Telephone Encounter (Signed)
Please tell the patient that I have checked the labs from Dr. Carolanne Grumbling office and his kidney function will permit him to start taking 1 metformin tablet daily and will inform Dr. Carolanne Grumbling about FDA recommendations

## 2014-12-15 NOTE — Telephone Encounter (Signed)
Noted, patient is aware. 

## 2014-12-23 DIAGNOSIS — E1165 Type 2 diabetes mellitus with hyperglycemia: Secondary | ICD-10-CM | POA: Diagnosis not present

## 2014-12-23 DIAGNOSIS — N183 Chronic kidney disease, stage 3 (moderate): Secondary | ICD-10-CM | POA: Diagnosis not present

## 2014-12-23 DIAGNOSIS — E782 Mixed hyperlipidemia: Secondary | ICD-10-CM | POA: Diagnosis not present

## 2014-12-23 DIAGNOSIS — Z79899 Other long term (current) drug therapy: Secondary | ICD-10-CM | POA: Diagnosis not present

## 2014-12-29 ENCOUNTER — Telehealth: Payer: Self-pay | Admitting: Cardiovascular Disease

## 2014-12-29 DIAGNOSIS — Z794 Long term (current) use of insulin: Secondary | ICD-10-CM | POA: Diagnosis not present

## 2014-12-29 DIAGNOSIS — R809 Proteinuria, unspecified: Secondary | ICD-10-CM | POA: Diagnosis not present

## 2014-12-29 DIAGNOSIS — E1122 Type 2 diabetes mellitus with diabetic chronic kidney disease: Secondary | ICD-10-CM | POA: Diagnosis not present

## 2014-12-29 DIAGNOSIS — E782 Mixed hyperlipidemia: Secondary | ICD-10-CM | POA: Diagnosis not present

## 2014-12-29 DIAGNOSIS — N183 Chronic kidney disease, stage 3 (moderate): Secondary | ICD-10-CM | POA: Diagnosis not present

## 2014-12-29 DIAGNOSIS — E1165 Type 2 diabetes mellitus with hyperglycemia: Secondary | ICD-10-CM | POA: Diagnosis not present

## 2014-12-29 DIAGNOSIS — I10 Essential (primary) hypertension: Secondary | ICD-10-CM | POA: Diagnosis not present

## 2014-12-29 DIAGNOSIS — I2510 Atherosclerotic heart disease of native coronary artery without angina pectoris: Secondary | ICD-10-CM | POA: Diagnosis not present

## 2014-12-29 NOTE — Telephone Encounter (Signed)
Received records from [facility] for appointment with Dr Claiborne Billings on 01/13/15.  Records given to [facility] (medical records) for Dr Evette Georges schedule on 01/13/15.

## 2014-12-30 DIAGNOSIS — E1129 Type 2 diabetes mellitus with other diabetic kidney complication: Secondary | ICD-10-CM | POA: Diagnosis not present

## 2014-12-30 DIAGNOSIS — Z6831 Body mass index (BMI) 31.0-31.9, adult: Secondary | ICD-10-CM | POA: Diagnosis not present

## 2015-01-11 ENCOUNTER — Ambulatory Visit: Payer: Medicare Other | Admitting: Nutrition

## 2015-01-12 ENCOUNTER — Ambulatory Visit: Payer: Medicare Other | Admitting: Nutrition

## 2015-01-12 ENCOUNTER — Ambulatory Visit: Payer: Medicare Other | Admitting: Endocrinology

## 2015-01-12 DIAGNOSIS — E1165 Type 2 diabetes mellitus with hyperglycemia: Secondary | ICD-10-CM | POA: Diagnosis not present

## 2015-01-13 ENCOUNTER — Encounter: Payer: Self-pay | Admitting: Cardiovascular Disease

## 2015-01-13 ENCOUNTER — Ambulatory Visit (INDEPENDENT_AMBULATORY_CARE_PROVIDER_SITE_OTHER): Payer: Medicare Other | Admitting: Cardiovascular Disease

## 2015-01-13 VITALS — BP 126/84 | HR 58 | Ht 68.0 in | Wt 204.3 lb

## 2015-01-13 DIAGNOSIS — E1142 Type 2 diabetes mellitus with diabetic polyneuropathy: Secondary | ICD-10-CM

## 2015-01-13 DIAGNOSIS — E1165 Type 2 diabetes mellitus with hyperglycemia: Secondary | ICD-10-CM

## 2015-01-13 DIAGNOSIS — E785 Hyperlipidemia, unspecified: Secondary | ICD-10-CM

## 2015-01-13 DIAGNOSIS — G629 Polyneuropathy, unspecified: Secondary | ICD-10-CM

## 2015-01-13 DIAGNOSIS — I10 Essential (primary) hypertension: Secondary | ICD-10-CM

## 2015-01-13 DIAGNOSIS — E039 Hypothyroidism, unspecified: Secondary | ICD-10-CM

## 2015-01-13 DIAGNOSIS — I2510 Atherosclerotic heart disease of native coronary artery without angina pectoris: Secondary | ICD-10-CM

## 2015-01-13 DIAGNOSIS — IMO0002 Reserved for concepts with insufficient information to code with codable children: Secondary | ICD-10-CM

## 2015-01-13 DIAGNOSIS — I2583 Coronary atherosclerosis due to lipid rich plaque: Principal | ICD-10-CM

## 2015-01-13 MED ORDER — ISOSORBIDE MONONITRATE ER 60 MG PO TB24: 60.0000 mg | ORAL_TABLET | Freq: Every day | ORAL | Status: DC

## 2015-01-13 NOTE — Patient Instructions (Signed)
Your physician recommends that you schedule a follow-up appointment in: 3 Months  Your physician has recommended you make the following change in your medication: Increase Isosorbide to 60 mg daily

## 2015-01-14 ENCOUNTER — Encounter: Payer: Self-pay | Admitting: Cardiovascular Disease

## 2015-01-14 NOTE — Progress Notes (Signed)
Patient ID: Roberto Haley, male   DOB: 1941-03-29, 74 y.o.   MRN: [id number]    HPI: [name] is a 74 y.o. male who presents to the office for an 8 month cardiology followup evaluation.   [name] has known CAD and underwent CABG revascularization surgery in 1997 with LIMA to LAD, vein to the RCA. In March 1998 he underwent complex intervention to his native LAD and high speed rotational atherectomy (HSRA) and stenting of his LAD and PTCA of his diagonal vessel after he was found to have stenosis in the LIMA graft. In June 2013 due to 99% in-stent restenosis in the LAD stent and a new stenosis of 90% the distal circumflex, he underwent two-vessel intervention with insertion of a 2.75x22 mm resident stent in the LAD and PTCA of the distal circumflex via his previously placed stent which started in the mid left main coronary artery. A cardiopulmonary met test showed reduced maximum oxygen consumption. I  reduced his beta blocker therapy due to significant chronotropic incompetence and further titrated his Ranexa to 1000 mg twice a day.  A nuclear perfusion study in June 2014 showed distal anteroseptal scar without significant ischemia. He did have PACs and PVCs in gating was not done.  He  fell on the Fourth of July 2014 and landed on his head. He states he initially was seen at urgent care, transferred to [facility], and then transferred to [facility] where was hospitalized for a week. He did not have neurosurgery. He was told of having "blood on his brain."  He did have a left-sided per hemiparesis. He then spent 2 months at [facility] rehabilitation unit and has [facility] additional [facility] [place] until October 31.  He is significant improved following his head trauma with return of his memory.  When I last saw him, I reduced his beta blocker therapy due to bradycardia.  Recently, he has been on amlodipine 5 mg, Lotensin 40 mg, metoprolol succinate 50 mg,  isosorbide 30 mg and torsemide 20 mg.  His peripheral edema has improved.   He is now on 8 atorvastatin 40 mg in place of pravastatin.  He also is on thyroid replacement therapy with Synthroid.  He has had significant difficulty with his diabetes control.  He recently has switched from seeing Dr. Dwyane Dee and apparently just established endocrinologic care with Dr. Elyse Hsu on 12/29/2014.  He does admit to occasional palpitations at night.  He also admits to vague symptoms of chest discomfort at night, but none with activity.  Past Medical History  Diagnosis Date  . Coronary artery disease   . Diabetes mellitus   . Groin hematoma 02/19/12    RIGHT  . Hyperlipidemia   . Hypertension   . Obesity   . PVC (premature ventricular contraction)   . Chronic kidney disease     CKD STAGE 3;  CHRONIC RENAL INSUFFICIENCY  . Neuropathy due to type 2 diabetes mellitus     Past Surgical History  Procedure Laterality Date  . Coronary artery bypass graft  1997    LIMA to the LAD and vein to the RCA;  . Cardiac catheterization  01/14/12    LV FXN EF 50-55% W/MILD DISTAL INFERIOR HYPOKINESIS; PCI: LAD PTCA/STENT W/NEW 2.75X22 RESOLUTE DES IN MID LAD POST DIALTED TO 3.08 TO 2.97 TAPER: 99%-80% TO 0; LCX: PTCA VIA LM STENT W/2.25X12 SPRINTER BALLOON: 90%-5; LAD: PATENT PROXIMAL STENT EXTENDING INTO LM W/30-40% SMOOTH NARROWING IN DISTAL PORTION OF  STENT; 99% IN STENT RESTENOSIS OF MID LAD STENT   . Lower venous duplex  02/05/12    ESSENTIALLY NORMAL RIGHT LOWER EXTRIMTY VENOUS DUPLEX DOPPLER EVALUATION  . Lower arterial doppler  01/24/12    ESSENTIALLY NORMAL RIGHT GROIN DUPLEX EVALUATION S/P THROMBIN INJECTION  . Nm myoview ltd  01/09/12    HIGH RISK SCAN. COMPARED TO PREVIOUS STUDY, THERE IS NOW ISCHEMIA PRESENT. ABNORMAL MYOCARDIAL PERFUSION STUDY. THERE IS NEW MILD INFEROLATERAL ISCHEMIA TOWARDS THE BASE; PT TO FOLLOW UP WITH DR. TK  . Doppler echocardiography  01/09/12    LV NORMAL IN SIZE, NORMAL WAL THICKNESS,  EF 50-55%; MILD POSTERIOR WALL HYOPKINESIS, THERE IS MILD INFERIOR WALL HYPOKINESIS  . Left heart catheterization with coronary/graft angiogram N/A 01/14/2012    Procedure: LEFT HEART CATHETERIZATION WITH Beatrix Fetters;  Surgeon: Troy Sine, MD;  Location: [facility] CATH LAB;  Service: Cardiovascular;  Laterality: N/A;  . Percutaneous coronary stent intervention (pci-s) N/A 01/14/2012    Procedure: PERCUTANEOUS CORONARY STENT INTERVENTION (PCI-S);  Surgeon: Troy Sine, MD;  Location: [facility] CATH LAB;  Service: Cardiovascular;  Laterality: N/A;    Allergies  Allergen Reactions  . Tramadol Nausea And Vomiting  . Codeine Rash    Current Outpatient Prescriptions  Medication Sig Dispense Refill  . amLODipine (NORVASC) 5 MG tablet Take 1 tablet by mouth daily.    Marland Kitchen aspirin 81 MG tablet Take 81 mg by mouth daily.    Marland Kitchen atorvastatin (LIPITOR) 40 MG tablet   0  . benazepril (LOTENSIN) 40 MG tablet Take 40 mg by mouth daily.    . cholecalciferol (VITAMIN D) 1000 UNITS tablet Take 1,000 Units by mouth daily.    . Dulaglutide (TRULICITY) 1.61 WR/6.0AV SOPN Inject into the skin. Once weekly on thursday    . fluticasone (FLONASE) 50 MCG/ACT nasal spray 2 sprays daily.    . insulin aspart (NOVOLOG) 100 UNIT/ML FlexPen Inject 10 Units into the skin 3 (three) times daily with meals.     . Insulin Glargine (LANTUS SOLOSTAR) 100 UNIT/ML Solostar Pen Inject 55 Units into the skin daily at 10 pm.     . LEVEMIR FLEXTOUCH 100 UNIT/ML Pen Inject 35 Units into the skin 2 (two) times daily.   12  . metFORMIN (GLUCOPHAGE-XR) 750 MG 24 hr tablet Take 750 mg by mouth daily with breakfast.    . metoprolol succinate (TOPROL-XL) 100 MG 24 hr tablet Take 50 mg by mouth daily.    . Multiple Vitamin (MULTIVITAMIN WITH MINERALS) TABS Take 1 tablet by mouth daily.    Marland Kitchen NOVOFINE AUTOCOVER 30G X 8 MM MISC     . ONE TOUCH ULTRA TEST test strip   4  . oxybutynin (DITROPAN) 5 MG tablet Take 1 tablet by mouth daily.    .  polyethylene glycol powder (GLYCOLAX/MIRALAX) powder     . potassium chloride SA (K-DUR,KLOR-CON) 20 MEQ tablet Take 1 tablet by mouth daily.    Marland Kitchen PREVNAR 13 SUSP injection   0  . ranitidine (ZANTAC) 150 MG tablet Take 150 mg by mouth 2 (two) times daily.    Marland Kitchen SYNTHROID 75 MCG tablet 1 tablet daily.    Marland Kitchen torsemide (DEMADEX) 20 MG tablet Take 20 mg by mouth daily.    . VENTOLIN HFA 108 (90 BASE) MCG/ACT inhaler Inhale 1 puff into the lungs as needed.    . vitamin C (ASCORBIC ACID) 500 MG tablet Take 500 mg by mouth daily.    . Vitamin D, Cholecalciferol, 1000 UNITS  CAPS   12  . isosorbide mononitrate (IMDUR) 60 MG 24 hr tablet Take 1 tablet (60 mg total) by mouth daily. 30 tablet 9  . nitroGLYCERIN (NITROSTAT) 0.4 MG SL tablet Place 0.4 mg under the tongue every 5 (five) minutes as needed. For chest pain     No current facility-administered medications for this visit.    Socially, he is married has one child 2 grandchildren and 2 great-grandchildren. There is no tobacco or alcohol use.  ROS General: Negative; No fevers, chills, or night sweats;  HEENT: Positive for cataracts; No changes in hearing, sinus congestion, difficulty swallowing Pulmonary: Negative; No cough, wheezing, shortness of breath, hemoptysis Cardiovascular: See history of present illness;  GI: Positive for GERD on ranitidine; No nausea, vomiting, diarrhea, or abdominal pain GU: Negative; No dysuria, hematuria, or difficulty voiding Musculoskeletal: Negative; no myalgias, joint pain, or weakness Hematologic/Oncology: Negative; no easy bruising, bleeding Endocrine: Positive for hypothyroidism, on Synthroid replacement; diabetes not well controlled Neuro: Negative; no changes in balance, headaches Skin: Negative; No rashes or skin lesions Psychiatric: Negative; No behavioral problems, depression Sleep: Negative; No snoring, daytime sleepiness, hypersomnolence, bruxism, restless legs, hypnogognic hallucinations, no  cataplexy Other comprehensive 14 point system review is negative.  PE BP 126/84 mmHg  Pulse 58  Ht '5\' 8"'  (1.727 m)  Wt 204 lb 4.8 oz (92.67 kg)  BMI 31.07 kg/m2   Wt Readings from Last 3 Encounters:  01/13/15 204 lb 4.8 oz (92.67 kg)  12/13/14 203 lb 12.8 oz (92.443 kg)  11/01/14 204 lb 12.8 oz (92.897 kg)   General: Alert, oriented, no distress.  HEENT: Normocephalic, atraumatic. Pupils round and reactive; sclera anicteric; no lid lag. Atrophic muscles intact. No xanthelasmas. Nose without nasal septal hypertrophy Mouth/Parynx benign; Mallinpatti scale 3 Neck: No JVD, no carotid bruits with normal carotid upstroke Chest wall: Nontender to palpation Lungs: clear to ausculatation and percussion; no wheezing or rales Heart: RRR, s1 s2 normal 2/6 sem, unchanged, no S3, gallop; no diastolic murmur.  No rubs, thrills or heaves Abdomen: soft, nontender; no hepatosplenomehaly, BS+; abdominal aorta nontender and not dilated by palpation. Mild central adiposity Pulses 2+ Extremities: Trivial residual edema; no clubbing cyanosis, Homan's sign negative  Neurologic: grossly nonfocal Psychologic: Normal affect and mood  ECG (independently read by me): Sinus bradycardia 58 bpm.  Normal intervals.  October 2015 ECG (independent read by me): Sinus bradycardia at 50 bpm.  No ectopy.  PR interval 196 msec,   Borderline LVH.  Prior June 2015 ECG (independently read by me): Sinus bradycardia 52 beats per minute.  Borderline first degree AV block with a PR interval of 200 ms.  December 2014 ECG (independently read by me): Sinus rhythm at 54 beats per minute. No ectopy. Normal intervals.  ECG from 07/08/2013 : reveals sinus rhythm at 57 beats per minute. QTc interval 453 ms.  LABS: BMP Latest Ref Rng 11/01/2014 01/18/2012 01/15/2012  Glucose 70 - 99 mg/dL - 223(H) 154(H)  BUN 6 - 23 mg/dL - 23 12  Creatinine 0.6 - 1.3 mg/dL 1.6(A) 1.22 0.97  Sodium 135 - 145 mEq/L - 138 138  Potassium 3.5 - 5.1  mEq/L - 3.7 3.5  Chloride 96 - 112 mEq/L - 100 101  CO2 19 - 32 mEq/L - 28 28  Calcium 8.4 - 10.5 mg/dL - 9.1 8.6   No flowsheet data found. CBC Latest Ref Rng 01/19/2012 01/18/2012 01/16/2012  WBC 4.0 - 10.5 K/uL 9.1 8.9 -  Hemoglobin 13.0 - 17.0 g/dL 10.1(L) 10.2(L)  11.5(L)  Hematocrit 39.0 - 52.0 % 30.1(L) 30.3(L) 33.5(L)  Platelets 150 - 400 K/uL 190 202 -   Lab Results  Component Value Date   MCV 88.3 01/19/2012   MCV 88.6 01/18/2012   MCV 87.4 01/15/2012   No results found for: TSH  Lab Results  Component Value Date   HGBA1C 10.7* 11/01/2014   Lipid Panel     Component Value Date/Time   CHOL 138 11/01/2014   HDL 39 11/01/2014     RADIOLOGY: No results found.    ASSESSMENT AND PLAN:   Mr. Felten is a 74 year old white male who is 19 years status post CABG revascularization surgery as has a documented occluded LIMA. He previously undergone intervention both to his proximal LAD extending into the left main with stenting and stenting of his mid LAD. In June 2013 he developed severe in-stent restenosis of his LAD stent and a DES stent was in place at that time. We also went through the stent struts in the left main and a PTCA of the distal circumflex. He did have a mild/moderate disease in his PDA after his SVG to his PDA insertion. His last nuclear perfusion study  after experiencing some vague episodes of chest pain was low risk and showed distal anteroseptal scar with the extent of 10% without associated ischemia. He did sustain trauma leading to injury of his brain.  He has recovered from his head trauma. His blood pressure is well controlled on his current medical regimen, consisting of amlodipine, benazepril, beta blocker, and diuretic therapy.  His significant bradycardia has improved with reduction of his beta blocker dose.  Her, he has noticed some vague mild palpitations at nighttime.  He also notes some vague chest sensation at night, but denies any definitive  exertional precipitation.  I am electing to increase his isosorbide mononitrate to 60 mg daily.  He tells me that yesterday he had a complete set of blood work obtained.  He is now following Dr. Elyse Hsu for his diabetes has recently been poorly controlled.  He is mildly obese with a body mass index of 31.07, and weight reduction and increased exercise was recommended.  He is on lipid-lowering therapy, now consisting of atorvastatin 40 mg for hyperlipidemia with target LDL less than 70.  I will see him in 2-3 months for follow-up evaluation.    Time spent: 25 minutes  Troy Sine, MD, [facility]  01/14/2015 8:08 AM

## 2015-01-18 DIAGNOSIS — I2510 Atherosclerotic heart disease of native coronary artery without angina pectoris: Secondary | ICD-10-CM | POA: Diagnosis not present

## 2015-01-18 DIAGNOSIS — E1165 Type 2 diabetes mellitus with hyperglycemia: Secondary | ICD-10-CM | POA: Diagnosis not present

## 2015-01-18 DIAGNOSIS — E1122 Type 2 diabetes mellitus with diabetic chronic kidney disease: Secondary | ICD-10-CM | POA: Diagnosis not present

## 2015-01-18 DIAGNOSIS — Z794 Long term (current) use of insulin: Secondary | ICD-10-CM | POA: Diagnosis not present

## 2015-01-18 DIAGNOSIS — R809 Proteinuria, unspecified: Secondary | ICD-10-CM | POA: Diagnosis not present

## 2015-01-18 DIAGNOSIS — N183 Chronic kidney disease, stage 3 (moderate): Secondary | ICD-10-CM | POA: Diagnosis not present

## 2015-01-18 DIAGNOSIS — I10 Essential (primary) hypertension: Secondary | ICD-10-CM | POA: Diagnosis not present

## 2015-01-18 DIAGNOSIS — E782 Mixed hyperlipidemia: Secondary | ICD-10-CM | POA: Diagnosis not present

## 2015-01-24 ENCOUNTER — Telehealth: Payer: Self-pay | Admitting: Cardiovascular Disease

## 2015-01-24 NOTE — Telephone Encounter (Signed)
Received records from [facility] Endocr[facility]ology for appointment on 04/22/15 with Dr Claiborne Billings.  Records given to [facility] (medical records) for Dr Evette Georges schedulel on 04/22/15.  lp

## 2015-02-03 DIAGNOSIS — E039 Hypothyroidism, unspecified: Secondary | ICD-10-CM | POA: Diagnosis not present

## 2015-02-03 DIAGNOSIS — L989 Disorder of the skin and subcutaneous tissue, unspecified: Secondary | ICD-10-CM | POA: Diagnosis not present

## 2015-02-03 DIAGNOSIS — E785 Hyperlipidemia, unspecified: Secondary | ICD-10-CM | POA: Diagnosis not present

## 2015-02-03 DIAGNOSIS — E1129 Type 2 diabetes mellitus with other diabetic kidney complication: Secondary | ICD-10-CM | POA: Diagnosis not present

## 2015-02-03 DIAGNOSIS — Z125 Encounter for screening for malignant neoplasm of prostate: Secondary | ICD-10-CM | POA: Diagnosis not present

## 2015-02-03 DIAGNOSIS — Z6831 Body mass index (BMI) 31.0-31.9, adult: Secondary | ICD-10-CM | POA: Diagnosis not present

## 2015-02-03 DIAGNOSIS — I10 Essential (primary) hypertension: Secondary | ICD-10-CM | POA: Diagnosis not present

## 2015-02-15 DIAGNOSIS — E1165 Type 2 diabetes mellitus with hyperglycemia: Secondary | ICD-10-CM | POA: Diagnosis not present

## 2015-02-15 DIAGNOSIS — E782 Mixed hyperlipidemia: Secondary | ICD-10-CM | POA: Diagnosis not present

## 2015-02-15 DIAGNOSIS — N183 Chronic kidney disease, stage 3 (moderate): Secondary | ICD-10-CM | POA: Diagnosis not present

## 2015-02-15 DIAGNOSIS — R809 Proteinuria, unspecified: Secondary | ICD-10-CM | POA: Diagnosis not present

## 2015-02-15 DIAGNOSIS — I10 Essential (primary) hypertension: Secondary | ICD-10-CM | POA: Diagnosis not present

## 2015-02-15 DIAGNOSIS — Z794 Long term (current) use of insulin: Secondary | ICD-10-CM | POA: Diagnosis not present

## 2015-02-15 DIAGNOSIS — E1122 Type 2 diabetes mellitus with diabetic chronic kidney disease: Secondary | ICD-10-CM | POA: Diagnosis not present

## 2015-02-15 DIAGNOSIS — I2510 Atherosclerotic heart disease of native coronary artery without angina pectoris: Secondary | ICD-10-CM | POA: Diagnosis not present

## 2015-02-16 ENCOUNTER — Telehealth: Payer: Self-pay | Admitting: Cardiovascular Disease

## 2015-02-16 NOTE — Telephone Encounter (Signed)
Received records from [facility]ocr[facility] for appointment on 04/22/15 with Dr Claiborne Billings.  Records given to [facility] (medical records) for Dr Evette Georges schedule on 04/22/15. lp

## 2015-03-30 DIAGNOSIS — E1165 Type 2 diabetes mellitus with hyperglycemia: Secondary | ICD-10-CM | POA: Diagnosis not present

## 2015-03-31 DIAGNOSIS — L821 Other seborrheic keratosis: Secondary | ICD-10-CM | POA: Diagnosis not present

## 2015-03-31 DIAGNOSIS — L570 Actinic keratosis: Secondary | ICD-10-CM | POA: Diagnosis not present

## 2015-04-01 DIAGNOSIS — R809 Proteinuria, unspecified: Secondary | ICD-10-CM | POA: Diagnosis not present

## 2015-04-01 DIAGNOSIS — E782 Mixed hyperlipidemia: Secondary | ICD-10-CM | POA: Diagnosis not present

## 2015-04-01 DIAGNOSIS — I2510 Atherosclerotic heart disease of native coronary artery without angina pectoris: Secondary | ICD-10-CM | POA: Diagnosis not present

## 2015-04-01 DIAGNOSIS — E1165 Type 2 diabetes mellitus with hyperglycemia: Secondary | ICD-10-CM | POA: Diagnosis not present

## 2015-04-01 DIAGNOSIS — E1122 Type 2 diabetes mellitus with diabetic chronic kidney disease: Secondary | ICD-10-CM | POA: Diagnosis not present

## 2015-04-01 DIAGNOSIS — I10 Essential (primary) hypertension: Secondary | ICD-10-CM | POA: Diagnosis not present

## 2015-04-01 DIAGNOSIS — N183 Chronic kidney disease, stage 3 (moderate): Secondary | ICD-10-CM | POA: Diagnosis not present

## 2015-04-01 DIAGNOSIS — Z794 Long term (current) use of insulin: Secondary | ICD-10-CM | POA: Diagnosis not present

## 2015-04-08 DIAGNOSIS — K5909 Other constipation: Secondary | ICD-10-CM | POA: Diagnosis not present

## 2015-04-08 DIAGNOSIS — Z6831 Body mass index (BMI) 31.0-31.9, adult: Secondary | ICD-10-CM | POA: Diagnosis not present

## 2015-04-08 DIAGNOSIS — E860 Dehydration: Secondary | ICD-10-CM | POA: Diagnosis not present

## 2015-04-08 DIAGNOSIS — Z1211 Encounter for screening for malignant neoplasm of colon: Secondary | ICD-10-CM | POA: Diagnosis not present

## 2015-04-22 ENCOUNTER — Ambulatory Visit (INDEPENDENT_AMBULATORY_CARE_PROVIDER_SITE_OTHER): Payer: Medicare Other | Admitting: Cardiovascular Disease

## 2015-04-22 VITALS — BP 170/82 | HR 65 | Ht 67.0 in | Wt 197.0 lb

## 2015-04-22 DIAGNOSIS — IMO0002 Reserved for concepts with insufficient information to code with codable children: Secondary | ICD-10-CM

## 2015-04-22 DIAGNOSIS — I25810 Atherosclerosis of coronary artery bypass graft(s) without angina pectoris: Secondary | ICD-10-CM

## 2015-04-22 DIAGNOSIS — E1165 Type 2 diabetes mellitus with hyperglycemia: Secondary | ICD-10-CM

## 2015-04-22 DIAGNOSIS — N289 Disorder of kidney and ureter, unspecified: Secondary | ICD-10-CM

## 2015-04-22 DIAGNOSIS — E785 Hyperlipidemia, unspecified: Secondary | ICD-10-CM | POA: Diagnosis not present

## 2015-04-22 DIAGNOSIS — I10 Essential (primary) hypertension: Secondary | ICD-10-CM | POA: Diagnosis not present

## 2015-04-22 DIAGNOSIS — E1142 Type 2 diabetes mellitus with diabetic polyneuropathy: Secondary | ICD-10-CM

## 2015-04-22 DIAGNOSIS — G629 Polyneuropathy, unspecified: Secondary | ICD-10-CM

## 2015-04-22 DIAGNOSIS — I2510 Atherosclerotic heart disease of native coronary artery without angina pectoris: Secondary | ICD-10-CM

## 2015-04-22 MED ORDER — AMLODIPINE BESYLATE 5 MG PO TABS: 7.5000 mg | ORAL_TABLET | Freq: Every day | ORAL | Status: DC

## 2015-04-22 NOTE — Patient Instructions (Addendum)
Your physician has recommended you make the following change in your medication: increase the amlodipine to 7.5 mg daily ( 1& 1/2 tablet)  Your physician recommends that you schedule a follow-up appointment and Lexiscan Myoview in 6 months.

## 2015-04-25 ENCOUNTER — Encounter: Payer: Self-pay | Admitting: Cardiovascular Disease

## 2015-04-25 NOTE — Progress Notes (Signed)
Patient ID: Roberto Haley, male   DOB: 1941-07-24, 74 y.o.   MRN: [id number]     HPI: Roberto Haley is a 74 y.o. male who presents to the office for a 3 month cardiology followup evaluation.   Roberto Haley has known CAD and underwent CABG revascularization surgery in 1997 with LIMA to LAD, vein to the RCA. In March 1998 he underwent complex intervention to his native LAD and high speed rotational atherectomy (HSRA) and stenting of his LAD and PTCA of his diagonal vessel after he was found to have stenosis in the LIMA graft. In June 2013 due to 99% in-stent restenosis in the LAD stent and a new stenosis of 90% the distal circumflex, he underwent two-vessel intervention with insertion of a 2.75x22 mm resident stent in the LAD and PTCA of the distal circumflex via his previously placed stent which started in the mid left main coronary artery. A cardiopulmonary met test showed reduced maximum oxygen consumption. I  reduced his beta blocker therapy due to significant chronotropic incompetence and further titrated his Ranexa to 1000 mg twice a day.  A nuclear perfusion study in June 2014 showed distal anteroseptal scar without significant ischemia. He did have PACs and PVCs in gating was not done.  He  fell on the Fourth of July 2014 and landed on his head. He states he initially was seen at urgent care, transferred to [facility], and then transferred to [facility] where was hospitalized for a week. He did not have neurosurgery. He was told of having "blood on his brain."  He did have a left-sided per hemiparesis. He then spent 2 months at [facility] rehabilitation unit and has [facility] additional [facility] [place] until October 31.  He is significant improved following his head trauma with return of his memory.  Last year, I reduced his beta blocker therapy due to bradycardia.  He now sees Dr. Adele Barthel or for his endocrinology and remotely had seen Dr. Dwyane Dee.  He is on  thyroid place and 4.  Hypothyroidism.  He states that his blood sugars are slightly improving , and his hemoglobin has improved from 10 down to 8.  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  He denies recent chest pain.  He does note some mild shortness of breath. He is unaware of significant swelling. He tells me he had blood work done by Dr. Delena Bali 2 weeks ago. He presents for evaluation.  Past Medical History  Diagnosis Date  . Coronary artery disease   . Diabetes mellitus   . Groin hematoma 02/19/12    RIGHT  . Hyperlipidemia   . Hypertension   . Obesity   . PVC (premature ventricular contraction)   . Chronic kidney disease     CKD STAGE 3;  CHRONIC RENAL INSUFFICIENCY  . Neuropathy due to type 2 diabetes mellitus     Past Surgical History  Procedure Laterality Date  . Coronary artery bypass graft  1997    LIMA to the LAD and vein to the RCA;  . Cardiac catheterization  01/14/12    LV FXN EF 50-55% W/MILD DISTAL INFERIOR HYPOKINESIS; PCI: LAD PTCA/STENT W/NEW 2.75X22 RESOLUTE DES IN MID LAD POST DIALTED TO 3.08 TO 2.97 TAPER: 99%-80% TO 0; LCX: PTCA VIA LM STENT W/2.25X12 SPRINTER BALLOON: 90%-5; LAD: PATENT PROXIMAL STENT EXTENDING INTO LM W/30-40% SMOOTH NARROWING IN DISTAL PORTION OF STENT; 99% IN STENT RESTENOSIS OF MID LAD STENT   . Lower venous duplex  02/05/12    ESSENTIALLY NORMAL RIGHT LOWER EXTRIMTY VENOUS DUPLEX DOPPLER EVALUATION  . Lower arterial doppler  01/24/12    ESSENTIALLY NORMAL RIGHT GROIN DUPLEX EVALUATION S/P THROMBIN INJECTION  . Nm myoview ltd  01/09/12    HIGH RISK SCAN. COMPARED TO PREVIOUS STUDY, THERE IS NOW ISCHEMIA PRESENT. ABNORMAL MYOCARDIAL PERFUSION STUDY. THERE IS NEW MILD INFEROLATERAL ISCHEMIA TOWARDS THE BASE; PT TO FOLLOW UP WITH DR. TK  . Doppler echocardiography  01/09/12    LV NORMAL IN SIZE, NORMAL WAL THICKNESS, EF 50-55%; MILD POSTERIOR WALL HYOPKINESIS, THERE IS MILD INFERIOR WALL HYPOKINESIS  . Left heart catheterization with  coronary/graft angiogram N/A 01/14/2012    Procedure: LEFT HEART CATHETERIZATION WITH Beatrix Fetters;  Surgeon: Troy Sine, MD;  Location: [facility] CATH LAB;  Service: Cardiovascular;  Laterality: N/A;  . Percutaneous coronary stent intervention (pci-s) N/A 01/14/2012    Procedure: PERCUTANEOUS CORONARY STENT INTERVENTION (PCI-S);  Surgeon: Troy Sine, MD;  Location: [facility] CATH LAB;  Service: Cardiovascular;  Laterality: N/A;    Allergies  Allergen Reactions  . Tramadol Nausea And Vomiting  . Codeine Rash    Current Outpatient Prescriptions  Medication Sig Dispense Refill  . amLODipine (NORVASC) 5 MG tablet Take 1.5 tablets (7.5 mg total) by mouth daily. 45 tablet 11  . aspirin 81 MG tablet Take 81 mg by mouth daily.    Marland Kitchen atorvastatin (LIPITOR) 40 MG tablet   0  . benazepril (LOTENSIN) 40 MG tablet Take 40 mg by mouth daily.    . cholecalciferol (VITAMIN D) 1000 UNITS tablet Take 1,000 Units by mouth daily.    . Dulaglutide (TRULICITY) 2.12 YQ/8.2NO SOPN Inject into the skin. Once weekly on thursday    . fluticasone (FLONASE) 50 MCG/ACT nasal spray 2 sprays daily.    . insulin aspart (NOVOLOG) 100 UNIT/ML FlexPen Inject 10 Units into the skin 3 (three) times daily with meals.     . Insulin Glargine (LANTUS SOLOSTAR) 100 UNIT/ML Solostar Pen Inject 55 Units into the skin daily at 10 pm.     . isosorbide mononitrate (IMDUR) 30 MG 24 hr tablet Take 1 tablet by mouth daily.  12  . LEVEMIR FLEXTOUCH 100 UNIT/ML Pen Inject 35 Units into the skin 2 (two) times daily.   12  . metFORMIN (GLUCOPHAGE-XR) 750 MG 24 hr tablet Take 750 mg by mouth daily with breakfast.    . metoprolol succinate (TOPROL-XL) 100 MG 24 hr tablet Take 50 mg by mouth daily.    . Multiple Vitamin (MULTIVITAMIN WITH MINERALS) TABS Take 1 tablet by mouth daily.    Marland Kitchen NOVOFINE AUTOCOVER 30G X 8 MM MISC     . ONE TOUCH ULTRA TEST test strip   4  . oxybutynin (DITROPAN) 5 MG tablet Take 1 tablet by mouth daily.    .  polyethylene glycol powder (GLYCOLAX/MIRALAX) powder     . PREVNAR 13 SUSP injection   0  . ranitidine (ZANTAC) 150 MG tablet Take 150 mg by mouth 2 (two) times daily.    Marland Kitchen SYNTHROID 75 MCG tablet 1 tablet daily.    . TRULICITY 1.5 IB/7.0WU SOPN Inject 1.6 mg as directed once a week.  12  . VENTOLIN HFA 108 (90 BASE) MCG/ACT inhaler Inhale 1 puff into the lungs as needed.    . vitamin C (ASCORBIC ACID) 500 MG tablet Take 500 mg by mouth daily.    . Vitamin D, Cholecalciferol, 1000 UNITS CAPS   12  . nitroGLYCERIN (NITROSTAT) 0.4 MG  SL tablet Place 0.4 mg under the tongue every 5 (five) minutes as needed. For chest pain     No current facility-administered medications for this visit.    Socially, he is married has one child 2 grandchildren and 2 great-grandchildren. There is no tobacco or alcohol use.  ROS General: Negative; No fevers, chills, or night sweats;  HEENT: Positive for cataracts; No changes in hearing, sinus congestion, difficulty swallowing Pulmonary: Negative; No cough, wheezing, shortness of breath, hemoptysis Cardiovascular: See history of present illness;  GI: Positive for GERD on ranitidine; No nausea, vomiting, diarrhea, or abdominal pain GU: Negative; No dysuria, hematuria, or difficulty voiding Musculoskeletal: Negative; no myalgias, joint pain, or weakness Hematologic/Oncology: Negative; no easy bruising, bleeding Endocrine: Positive for hypothyroidism, on Synthroid replacement; diabetes not well controlled Neuro: Negative; no changes in balance, headaches Skin: Negative; No rashes or skin lesions Psychiatric: Negative; No behavioral problems, depression Sleep: Negative; No snoring, daytime sleepiness, hypersomnolence, bruxism, restless legs, hypnogognic hallucinations, no cataplexy Other comprehensive 14 point system review is negative.  PE BP 170/82 mmHg  Pulse 65  Ht '5\' 7"'  (1.702 m)  Wt 197 lb (89.359 kg)  BMI 30.85 kg/m2  Repeat blood pressure by me was  152/80. He states that he did not sleep well last night  Wt Readings from Last 3 Encounters:  04/22/15 197 lb (89.359 kg)  01/13/15 204 lb 4.8 oz (92.67 kg)  12/13/14 203 lb 12.8 oz (92.443 kg)   General: Alert, oriented, no distress.  HEENT: Normocephalic, atraumatic. Pupils round and reactive; sclera anicteric; no lid lag. Atrophic muscles intact. No xanthelasmas. Nose without nasal septal hypertrophy Mouth/Parynx benign; Mallinpatti scale 3 Neck: No JVD, no carotid bruits with normal carotid upstroke Chest wall: Nontender to palpation Lungs: clear to ausculatation and percussion; no wheezing or rales Heart: RRR, s1 s2 normal 2/6 sem, unchanged, no S3, gallop; no diastolic murmur.  No rubs, thrills or heaves Abdomen: soft, nontender; no hepatosplenomehaly, BS+; abdominal aorta nontender and not dilated by palpation. Mild central adiposity Pulses 2+ Extremities: Trivial residual edema; no clubbing cyanosis, Homan's sign negative  Neurologic: grossly nonfocal Psychologic: Normal affect and mood  ECG (independently read by me):  Normal sinus rhythm at 65 bpm. Mild voltage for LVH.  01/13/2015 ECG (independently read by me): Sinus bradycardia 58 bpm.  Normal intervals.  October 2015 ECG (independent read by me): Sinus bradycardia at 50 bpm.  No ectopy.  PR interval 196 msec,   Borderline LVH.  Prior June 2015 ECG (independently read by me): Sinus bradycardia 52 beats per minute.  Borderline first degree AV block with a PR interval of 200 ms.  December 2014 ECG (independently read by me): Sinus rhythm at 54 beats per minute. No ectopy. Normal intervals.  ECG from 07/08/2013 : reveals sinus rhythm at 57 beats per minute. QTc interval 453 ms.  LABS: BMP Latest Ref Rng 11/01/2014 01/18/2012 01/15/2012  Glucose 70 - 99 mg/dL - 223(H) 154(H)  BUN 6 - 23 mg/dL - 23 12  Creatinine 0.6 - 1.3 mg/dL 1.6(A) 1.22 0.97  Sodium 135 - 145 mEq/L - 138 138  Potassium 3.5 - 5.1 mEq/L - 3.7 3.5    Chloride 96 - 112 mEq/L - 100 101  CO2 19 - 32 mEq/L - 28 28  Calcium 8.4 - 10.5 mg/dL - 9.1 8.6   No flowsheet data found. CBC Latest Ref Rng 01/19/2012 01/18/2012 01/16/2012  WBC 4.0 - 10.5 K/uL 9.1 8.9 -  Hemoglobin 13.0 - 17.0 g/dL 10.1(L) 10.2(L)  11.5(L)  Hematocrit 39.0 - 52.0 % 30.1(L) 30.3(L) 33.5(L)  Platelets 150 - 400 K/uL 190 202 -   Lab Results  Component Value Date   MCV 88.3 01/19/2012   MCV 88.6 01/18/2012   MCV 87.4 01/15/2012   No results found for: TSH  Lab Results  Component Value Date   HGBA1C 10.7* 11/01/2014   Lipid Panel     Component Value Date/Time   CHOL 138 11/01/2014   HDL 39 11/01/2014     RADIOLOGY: No results found.    ASSESSMENT AND PLAN:  Roberto Haley is a 74 year old white male who is 19 years status post CABG revascularization surgery as has a documented occluded LIMA. He previously undergone intervention both to his proximal LAD extending into the left main with stenting and stenting of his mid LAD. In June 2013 he developed severe in-stent restenosis of his LAD stent and a DES stent was in place at that time. We also went through the stent struts in the left main and a PTCA of the distal circumflex. He did have a mild/moderate disease in his PDA after his SVG to his PDA insertion. His last nuclear perfusion study was in June 2014 after experiencing some vague episodes of chest pain was low risk and showed distal anteroseptal scar with the extent of 10% without associated ischemia.  His blood pressure  Today is elevated, but he states he slept very poorly last night.  He has been on Toprol-XL 50 mg, isosorbide 30 mg, benazepril 40 mg and amlodipine 5 mg.  He never increased his isosorbide from his visit.  I will try to obtain the results of his blood work.  Target LDL is less than 70.  He denies myalgias on atorvastatin. I am recommending further titration of his amlodipine to 7.5 mg for improved blood pressure control.  He does have renal  insufficiency with creatinine of 1.6 in March 2016.  He is diabetic  And is now followed by Dr. Adele Barthel or and has recently had some improvement in his previous significant glucose elevations.  In 6 months, I am recommending that he undergo a low nuclear perfusion study.  I will see him back in the office for follow-up evaluation.  Time spent: 25 minutes  Troy Sine, MD, [facility]  04/25/2015 5:02 PM

## 2015-04-28 DIAGNOSIS — K219 Gastro-esophageal reflux disease without esophagitis: Secondary | ICD-10-CM | POA: Diagnosis not present

## 2015-05-18 DIAGNOSIS — E1129 Type 2 diabetes mellitus with other diabetic kidney complication: Secondary | ICD-10-CM | POA: Diagnosis not present

## 2015-05-18 DIAGNOSIS — E785 Hyperlipidemia, unspecified: Secondary | ICD-10-CM | POA: Diagnosis not present

## 2015-05-18 DIAGNOSIS — E039 Hypothyroidism, unspecified: Secondary | ICD-10-CM | POA: Diagnosis not present

## 2015-05-18 DIAGNOSIS — Z6831 Body mass index (BMI) 31.0-31.9, adult: Secondary | ICD-10-CM | POA: Diagnosis not present

## 2015-05-18 DIAGNOSIS — I10 Essential (primary) hypertension: Secondary | ICD-10-CM | POA: Diagnosis not present

## 2015-05-26 DIAGNOSIS — E1165 Type 2 diabetes mellitus with hyperglycemia: Secondary | ICD-10-CM | POA: Diagnosis not present

## 2015-05-26 DIAGNOSIS — R809 Proteinuria, unspecified: Secondary | ICD-10-CM | POA: Diagnosis not present

## 2015-05-26 DIAGNOSIS — N183 Chronic kidney disease, stage 3 (moderate): Secondary | ICD-10-CM | POA: Diagnosis not present

## 2015-05-26 DIAGNOSIS — Z794 Long term (current) use of insulin: Secondary | ICD-10-CM | POA: Diagnosis not present

## 2015-05-26 DIAGNOSIS — E782 Mixed hyperlipidemia: Secondary | ICD-10-CM | POA: Diagnosis not present

## 2015-05-26 DIAGNOSIS — I2510 Atherosclerotic heart disease of native coronary artery without angina pectoris: Secondary | ICD-10-CM | POA: Diagnosis not present

## 2015-05-26 DIAGNOSIS — E1122 Type 2 diabetes mellitus with diabetic chronic kidney disease: Secondary | ICD-10-CM | POA: Diagnosis not present

## 2015-05-26 DIAGNOSIS — I10 Essential (primary) hypertension: Secondary | ICD-10-CM | POA: Diagnosis not present

## 2015-06-03 DIAGNOSIS — E1139 Type 2 diabetes mellitus with other diabetic ophthalmic complication: Secondary | ICD-10-CM | POA: Diagnosis not present

## 2015-06-09 DIAGNOSIS — K219 Gastro-esophageal reflux disease without esophagitis: Secondary | ICD-10-CM | POA: Diagnosis not present

## 2015-06-09 DIAGNOSIS — K5900 Constipation, unspecified: Secondary | ICD-10-CM | POA: Diagnosis not present

## 2015-06-23 DIAGNOSIS — T63301A Toxic effect of unspecified spider venom, accidental (unintentional), initial encounter: Secondary | ICD-10-CM | POA: Diagnosis not present

## 2015-07-21 DIAGNOSIS — E113391 Type 2 diabetes mellitus with moderate nonproliferative diabetic retinopathy without macular edema, right eye: Secondary | ICD-10-CM | POA: Diagnosis not present

## 2015-07-21 DIAGNOSIS — E113312 Type 2 diabetes mellitus with moderate nonproliferative diabetic retinopathy with macular edema, left eye: Secondary | ICD-10-CM | POA: Diagnosis not present

## 2015-07-25 DIAGNOSIS — E782 Mixed hyperlipidemia: Secondary | ICD-10-CM | POA: Diagnosis not present

## 2015-07-25 DIAGNOSIS — E1165 Type 2 diabetes mellitus with hyperglycemia: Secondary | ICD-10-CM | POA: Diagnosis not present

## 2015-07-26 DIAGNOSIS — I2510 Atherosclerotic heart disease of native coronary artery without angina pectoris: Secondary | ICD-10-CM | POA: Diagnosis not present

## 2015-07-26 DIAGNOSIS — I10 Essential (primary) hypertension: Secondary | ICD-10-CM | POA: Diagnosis not present

## 2015-07-26 DIAGNOSIS — E782 Mixed hyperlipidemia: Secondary | ICD-10-CM | POA: Diagnosis not present

## 2015-07-26 DIAGNOSIS — Z794 Long term (current) use of insulin: Secondary | ICD-10-CM | POA: Diagnosis not present

## 2015-07-26 DIAGNOSIS — E1122 Type 2 diabetes mellitus with diabetic chronic kidney disease: Secondary | ICD-10-CM | POA: Diagnosis not present

## 2015-07-26 DIAGNOSIS — R809 Proteinuria, unspecified: Secondary | ICD-10-CM | POA: Diagnosis not present

## 2015-07-26 DIAGNOSIS — E1165 Type 2 diabetes mellitus with hyperglycemia: Secondary | ICD-10-CM | POA: Diagnosis not present

## 2015-07-26 DIAGNOSIS — N183 Chronic kidney disease, stage 3 (moderate): Secondary | ICD-10-CM | POA: Diagnosis not present

## 2015-08-17 DIAGNOSIS — E113312 Type 2 diabetes mellitus with moderate nonproliferative diabetic retinopathy with macular edema, left eye: Secondary | ICD-10-CM | POA: Diagnosis not present

## 2015-08-25 DIAGNOSIS — Z6831 Body mass index (BMI) 31.0-31.9, adult: Secondary | ICD-10-CM | POA: Diagnosis not present

## 2015-08-25 DIAGNOSIS — E1129 Type 2 diabetes mellitus with other diabetic kidney complication: Secondary | ICD-10-CM | POA: Diagnosis not present

## 2015-08-25 DIAGNOSIS — I10 Essential (primary) hypertension: Secondary | ICD-10-CM | POA: Diagnosis not present

## 2015-08-25 DIAGNOSIS — E785 Hyperlipidemia, unspecified: Secondary | ICD-10-CM | POA: Diagnosis not present

## 2015-08-25 DIAGNOSIS — E039 Hypothyroidism, unspecified: Secondary | ICD-10-CM | POA: Diagnosis not present

## 2015-08-25 DIAGNOSIS — E669 Obesity, unspecified: Secondary | ICD-10-CM | POA: Diagnosis not present

## 2015-11-09 ENCOUNTER — Telehealth (HOSPITAL_COMMUNITY): Payer: Self-pay

## 2015-11-09 NOTE — Telephone Encounter (Signed)
Encounter complete. 

## 2015-11-10 ENCOUNTER — Inpatient Hospital Stay (HOSPITAL_COMMUNITY): Admission: RE | Admit: 2015-11-10 | Payer: Medicare Other | Source: Ambulatory Visit

## 2015-11-16 ENCOUNTER — Telehealth (HOSPITAL_COMMUNITY): Payer: Self-pay | Admitting: *Deleted

## 2015-11-16 NOTE — Telephone Encounter (Signed)
Left message on voicemail in reference to upcoming appointment scheduled for 11/22/15. Phone number given for a call back so details instructions can be given. Hubbard Robinson, RN

## 2015-11-17 DIAGNOSIS — E113393 Type 2 diabetes mellitus with moderate nonproliferative diabetic retinopathy without macular edema, bilateral: Secondary | ICD-10-CM | POA: Diagnosis not present

## 2015-11-21 ENCOUNTER — Telehealth (HOSPITAL_COMMUNITY): Payer: Self-pay | Admitting: *Deleted

## 2015-11-21 NOTE — Telephone Encounter (Signed)
Patient given detailed instructions per Myocardial Perfusion Study Information Sheet for the test on 11/22/15 at 9:45. Patient notified to arrive 15 minutes early and that it is imperative to arrive on time for appointment to keep from having the test rescheduled.  If you need to cancel or reschedule your appointment, please call the office within 24 hours of your appointment. Failure to do so may result in a cancellation of your appointment, and a $50 no show fee. Patient verbalized understanding.Roberto Haley

## 2015-11-22 ENCOUNTER — Encounter (HOSPITAL_COMMUNITY): Payer: Medicare Other

## 2015-11-22 ENCOUNTER — Ambulatory Visit (HOSPITAL_COMMUNITY): Payer: Medicare Other | Attending: Internal Medicine

## 2015-11-22 DIAGNOSIS — I10 Essential (primary) hypertension: Secondary | ICD-10-CM | POA: Diagnosis not present

## 2015-11-22 DIAGNOSIS — R079 Chest pain, unspecified: Secondary | ICD-10-CM | POA: Diagnosis not present

## 2015-11-22 DIAGNOSIS — I25810 Atherosclerosis of coronary artery bypass graft(s) without angina pectoris: Secondary | ICD-10-CM | POA: Diagnosis not present

## 2015-11-22 DIAGNOSIS — R0602 Shortness of breath: Secondary | ICD-10-CM

## 2015-11-22 DIAGNOSIS — E109 Type 1 diabetes mellitus without complications: Secondary | ICD-10-CM | POA: Diagnosis not present

## 2015-11-22 DIAGNOSIS — R9439 Abnormal result of other cardiovascular function study: Secondary | ICD-10-CM | POA: Insufficient documentation

## 2015-11-22 LAB — MYOCARDIAL PERFUSION IMAGING
CHL CUP RESTING HR STRESS: 61 {beats}/min
CSEPPHR: 81 {beats}/min
LV sys vol: 75 mL
LVDIAVOL: 127 mL (ref 62–150)
NUC STRESS TID: 1.07
RATE: 0.34
SDS: 0
SRS: 19
SSS: 19

## 2015-11-22 IMAGING — NM NM MISC PROCEDURE
9 series · 54 of 54 positions shown · non-contrast
Comparison: none

[Series 1: wbr_r-proj_st rest · 6.51mm/px · 6 of 64 frames shown]
[frame 6/64]
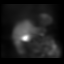
[frame 16/64]
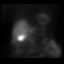
[frame 27/64]
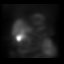
[frame 38/64]
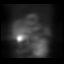
[frame 48/64]
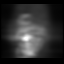
[frame 59/64]
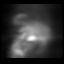

[Series 1: wbr_r-proj_st rest_(id)_sa · 6.5mm · 6.51mm/px · 6 of 64 frames shown]
[frame 6/64]
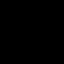
[frame 16/64]
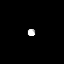
[frame 27/64]
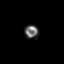
[frame 38/64]
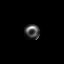
[frame 48/64]
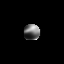
[frame 59/64]
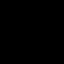

[Series 1: wbr_s-proj_st stress_(id)_sa · 6.5mm · 6.51mm/px · 6 of 512 frames shown (1 of 2)]
[frame 43/512]
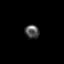
[frame 128/512]
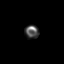
[frame 214/512]
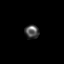
[frame 299/512]
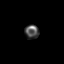
[frame 384/512]
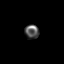
[frame 470/512]
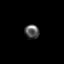

[Series 1: rest · 6.51mm/px · 6 of 64 frames shown]
[frame 6/64]
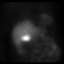
[frame 16/64]
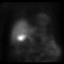
[frame 27/64]
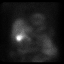
[frame 38/64]
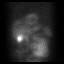
[frame 48/64]
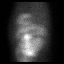
[frame 59/64]
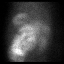

[Series 1: wbr_s-proj_st stress_(id)_sa · 6.5mm · 6.51mm/px · 6 of 64 frames shown (2 of 2)]
[frame 6/64]
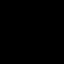
[frame 16/64]
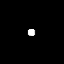
[frame 27/64]
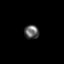
[frame 38/64]
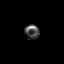
[frame 48/64]
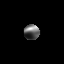
[frame 59/64]
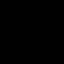

[Series 2: stress · 6.51mm/px · 6 of 512 frames shown (1 of 2)]
[frame 43/512]
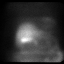
[frame 128/512]
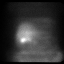
[frame 214/512]
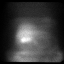
[frame 299/512]
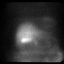
[frame 384/512]
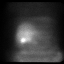
[frame 470/512]
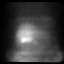

[Series 2: stress · 6.51mm/px · 6 of 64 frames shown (2 of 2)]
[frame 6/64]
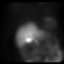
[frame 16/64]
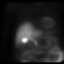
[frame 27/64]
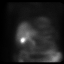
[frame 38/64]
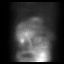
[frame 48/64]
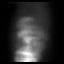
[frame 59/64]
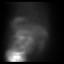

[Series 2: wbr_s-proj_st stress · 6.51mm/px · 6 of 512 frames shown (1 of 2)]
[frame 43/512]
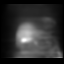
[frame 128/512]
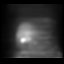
[frame 214/512]
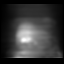
[frame 299/512]
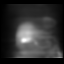
[frame 384/512]
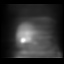
[frame 470/512]
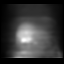

[Series 2: wbr_s-proj_st stress · 6.51mm/px · 6 of 64 frames shown (2 of 2)]
[frame 6/64]
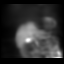
[frame 16/64]
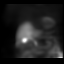
[frame 27/64]
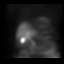
[frame 38/64]
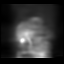
[frame 48/64]
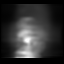
[frame 59/64]
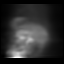

[54 of 54 positions shown; findings below may reference images not displayed]

Canned report from images found in remote index.

Refer to host system for actual result text.

## 2015-11-22 MED ORDER — TECHNETIUM TC 99M SESTAMIBI GENERIC - CARDIOLITE
33.0000 | Freq: Once | INTRAVENOUS | Status: AC | PRN
  Administered 2015-11-22: 33 via INTRAVENOUS

## 2015-11-22 MED ORDER — REGADENOSON 0.4 MG/5ML IV SOLN
0.4000 mg | Freq: Once | INTRAVENOUS | Status: AC
  Administered 2015-11-22: 0.4 mg via INTRAVENOUS

## 2015-11-22 MED ORDER — AMINOPHYLLINE 25 MG/ML IV SOLN
75.0000 mg | Freq: Once | INTRAVENOUS | Status: AC
  Administered 2015-11-22: 75 mg via INTRAVENOUS

## 2015-11-22 MED ORDER — TECHNETIUM TC 99M SESTAMIBI GENERIC - CARDIOLITE
10.7000 | Freq: Once | INTRAVENOUS | Status: AC | PRN
  Administered 2015-11-22: 11 via INTRAVENOUS

## 2015-11-24 DIAGNOSIS — Z6831 Body mass index (BMI) 31.0-31.9, adult: Secondary | ICD-10-CM | POA: Diagnosis not present

## 2015-11-24 DIAGNOSIS — E785 Hyperlipidemia, unspecified: Secondary | ICD-10-CM | POA: Diagnosis not present

## 2015-11-24 DIAGNOSIS — I10 Essential (primary) hypertension: Secondary | ICD-10-CM | POA: Diagnosis not present

## 2015-11-24 DIAGNOSIS — E669 Obesity, unspecified: Secondary | ICD-10-CM | POA: Diagnosis not present

## 2015-11-24 DIAGNOSIS — E039 Hypothyroidism, unspecified: Secondary | ICD-10-CM | POA: Diagnosis not present

## 2015-11-24 DIAGNOSIS — Z1389 Encounter for screening for other disorder: Secondary | ICD-10-CM | POA: Diagnosis not present

## 2015-11-24 DIAGNOSIS — Z9181 History of falling: Secondary | ICD-10-CM | POA: Diagnosis not present

## 2015-11-24 DIAGNOSIS — E1129 Type 2 diabetes mellitus with other diabetic kidney complication: Secondary | ICD-10-CM | POA: Diagnosis not present

## 2015-11-25 ENCOUNTER — Ambulatory Visit
Admission: RE | Admit: 2015-11-25 | Discharge: 2015-11-25 | Disposition: A | Discharge status: Home / Self Care | Payer: Medicare Other | Source: Ambulatory Visit | Attending: Cardiovascular Disease | Admitting: Cardiovascular Disease

## 2015-11-25 ENCOUNTER — Ambulatory Visit (INDEPENDENT_AMBULATORY_CARE_PROVIDER_SITE_OTHER): Payer: Medicare Other | Admitting: Cardiovascular Disease

## 2015-11-25 ENCOUNTER — Encounter: Payer: Self-pay | Admitting: Cardiovascular Disease

## 2015-11-25 VITALS — BP 102/70 | HR 72 | Ht 67.0 in | Wt 203.0 lb

## 2015-11-25 DIAGNOSIS — I493 Ventricular premature depolarization: Secondary | ICD-10-CM

## 2015-11-25 DIAGNOSIS — Z01812 Encounter for preprocedural laboratory examination: Secondary | ICD-10-CM | POA: Diagnosis not present

## 2015-11-25 DIAGNOSIS — D689 Coagulation defect, unspecified: Secondary | ICD-10-CM

## 2015-11-25 DIAGNOSIS — R9439 Abnormal result of other cardiovascular function study: Secondary | ICD-10-CM

## 2015-11-25 DIAGNOSIS — I25810 Atherosclerosis of coronary artery bypass graft(s) without angina pectoris: Secondary | ICD-10-CM | POA: Diagnosis not present

## 2015-11-25 DIAGNOSIS — N289 Disorder of kidney and ureter, unspecified: Secondary | ICD-10-CM

## 2015-11-25 DIAGNOSIS — E785 Hyperlipidemia, unspecified: Secondary | ICD-10-CM

## 2015-11-25 DIAGNOSIS — Z01818 Encounter for other preprocedural examination: Secondary | ICD-10-CM | POA: Diagnosis not present

## 2015-11-25 DIAGNOSIS — Z7901 Long term (current) use of anticoagulants: Secondary | ICD-10-CM | POA: Diagnosis not present

## 2015-11-25 DIAGNOSIS — I10 Essential (primary) hypertension: Secondary | ICD-10-CM

## 2015-11-25 DIAGNOSIS — R931 Abnormal findings on diagnostic imaging of heart and coronary circulation: Secondary | ICD-10-CM

## 2015-11-25 DIAGNOSIS — E1142 Type 2 diabetes mellitus with diabetic polyneuropathy: Secondary | ICD-10-CM

## 2015-11-25 IMAGING — CR DG CHEST 2V
2 series · 2 of 2 positions shown · non-contrast
Comparison: [DATE]

CLINICAL DATA: Essential hypertension. Preprocedure for cardiac
catheterization

EXAM:
CHEST  2 VIEW

[view not recorded (1 of 2)]
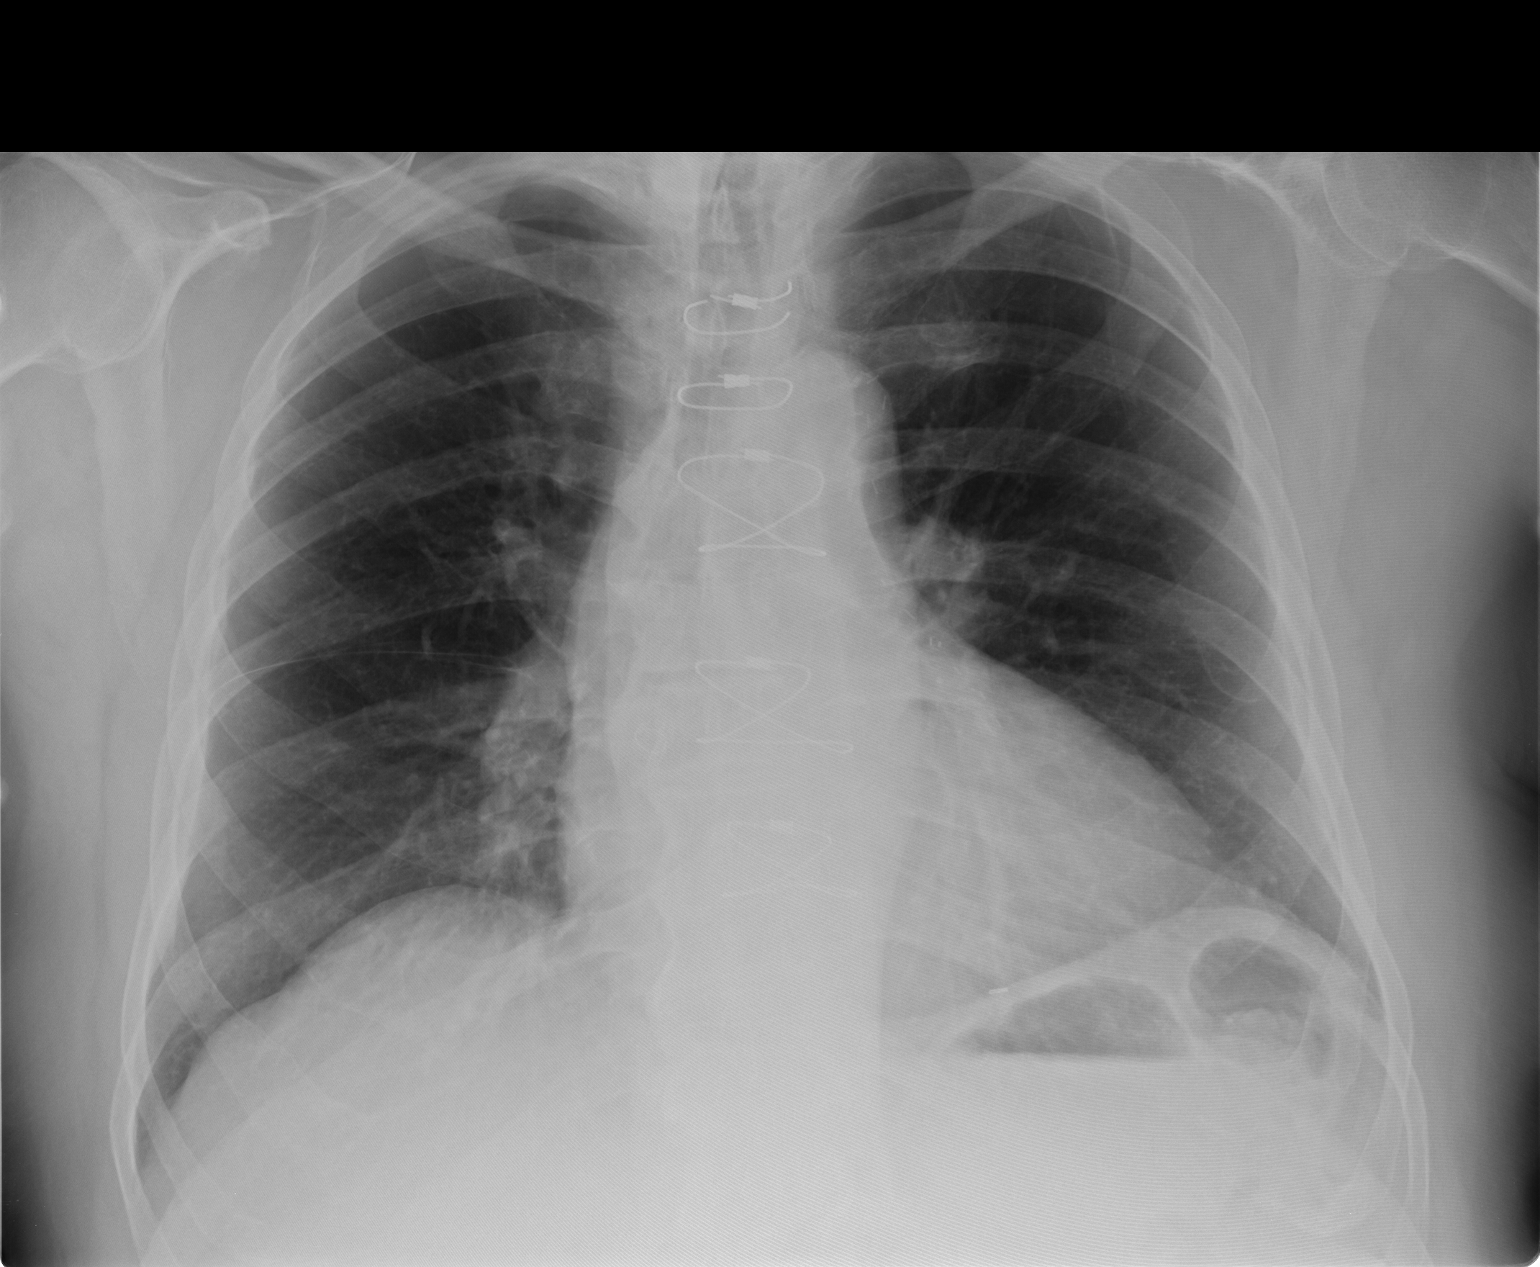

[view not recorded (2 of 2)]
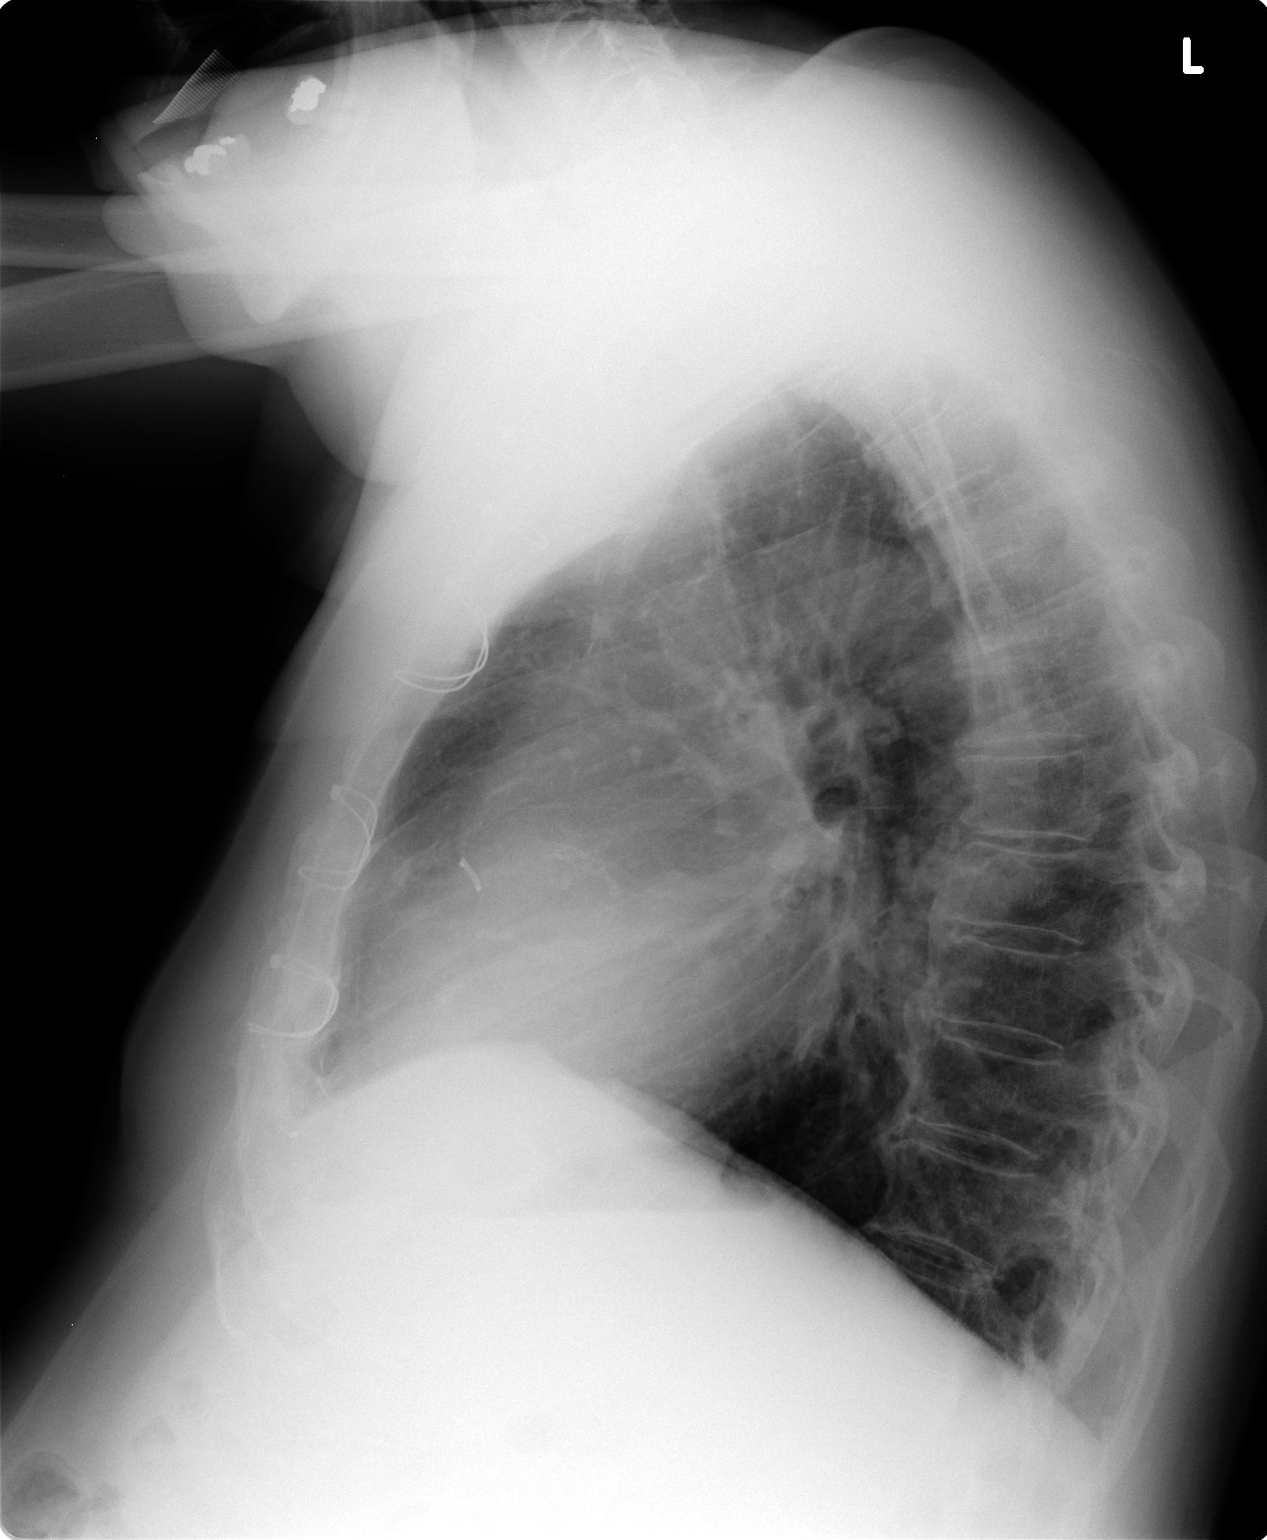

[2 of 2 positions shown; findings below may reference images not displayed]

FINDINGS: Median sternotomy and CABG. Heart size upper normal. Negative for
heart failure

Lungs are clear without infiltrate or effusion. No mass or
adenopathy. Mild hyperinflation lungs suggesting COPD.
IMPRESSION: No active cardiopulmonary disease.

## 2015-11-25 MED ORDER — ISOSORBIDE MONONITRATE ER 60 MG PO TB24: ORAL_TABLET | ORAL | Status: DC

## 2015-11-25 NOTE — Patient Instructions (Addendum)
Your physician has requested that you have a cardiac catheterization. Cardiac catheterization is used to diagnose and/or treat various heart conditions. Doctors may recommend this procedure for a number of different reasons. The most common reason is to evaluate chest pain. Chest pain can be a symptom of coronary artery disease (CAD), and cardiac catheterization can show whether plaque is narrowing or blocking your heart's arteries. This procedure is also used to evaluate the valves, as well as measure the blood flow and oxygen levels in different parts of your heart. For further information please visit [email or web address]. This will be done by Dr Claiborne Billings next week.  Following your catheterization, you will not be allowed to drive for 3 days.  No lifting, pushing, or pulling greater that 10 pounds is allowed for 1 week.  You will be required to have the following tests prior to the procedure:  1. Blood work-the blood work can be done no more than 7 days prior to the procedure.  It can be done at any [facility] lab.  There is one downstairs on the first floor of this building and one in the [facility] building [phone]).  2. Chest Xray-the chest xray order has already been placed at the [facility].      Your physician has recommended you make the following change in your medication: the isosorbide has been changed to 60 mg in the morning and 30 mg in the evening.

## 2015-11-26 LAB — COMPREHENSIVE METABOLIC PANEL
ALK PHOS: 58 U/L (ref 40–115)
ALT: 16 U/L (ref 9–46)
AST: 15 U/L (ref 10–35)
Albumin: 4.3 g/dL (ref 3.6–5.1)
BILIRUBIN TOTAL: 0.6 mg/dL (ref 0.2–1.2)
BUN: 20 mg/dL (ref 7–25)
CO2: 29 mmol/L (ref 20–31)
Calcium: 9.1 mg/dL (ref 8.6–10.3)
Chloride: 103 mmol/L (ref 98–110)
Creat: 1.46 mg/dL — ABNORMAL HIGH (ref 0.70–1.18)
GLUCOSE: 174 mg/dL — AB (ref 65–99)
POTASSIUM: 4.3 mmol/L (ref 3.5–5.3)
Sodium: 140 mmol/L (ref 135–146)
Total Protein: 7.1 g/dL (ref 6.1–8.1)

## 2015-11-26 LAB — CBC
HEMATOCRIT: 41.9 % (ref 38.5–50.0)
HEMOGLOBIN: 14.2 g/dL (ref 13.2–17.1)
MCH: 30.5 pg (ref 27.0–33.0)
MCHC: 33.9 g/dL (ref 32.0–36.0)
MCV: 89.9 fL (ref 80.0–100.0)
MPV: 11 fL (ref 7.5–12.5)
Platelets: 239 10*3/uL (ref 140–400)
RBC: 4.66 MIL/uL (ref 4.20–5.80)
RDW: 13.9 % (ref 11.0–15.0)
WBC: 8.6 10*3/uL (ref 3.8–10.8)

## 2015-11-26 LAB — APTT: aPTT: 33 seconds (ref 24–37)

## 2015-11-26 LAB — PROTIME-INR
INR: 1.07 (ref <1.50)
PROTHROMBIN TIME: 14 s (ref 11.6–15.2)

## 2015-11-27 ENCOUNTER — Encounter: Payer: Self-pay | Admitting: Cardiovascular Disease

## 2015-11-27 DIAGNOSIS — R9439 Abnormal result of other cardiovascular function study: Secondary | ICD-10-CM | POA: Insufficient documentation

## 2015-11-27 NOTE — Progress Notes (Signed)
Patient ID: MARKIS LANGLAND, male   DOB: 1941-04-04, 75 y.o.   MRN: [id number]     Primary M.D.: Dr. Foye Deer  HPI: TREYVEON MOCHIZUKI is a 75 y.o. male who presents to the office for a 7 month cardiology followup evaluation.   Mr Maye has known CAD and underwent CABG revascularization surgery in 1997 with LIMA to LAD, vein to the RCA. In March 1998 he underwent complex intervention to his native LAD and high speed rotational atherectomy (HSRA) and stenting of his LAD and PTCA of his diagonal vessel after he was found to have stenosis in the LIMA graft. In June 2013 due to 99% in-stent restenosis in the LAD stent and a new stenosis of 90% the distal circumflex, he underwent two-vessel intervention with insertion of a 2.75x22 mm resident stent in the LAD and PTCA of the distal circumflex via his previously placed stent which started in the mid left main coronary artery. A cardiopulmonary met test showed reduced maximum oxygen consumption. I  reduced his beta blocker therapy due to significant chronotropic incompetence and further titrated his Ranexa to 1000 mg twice a day.  A nuclear perfusion study in June 2014 showed distal anteroseptal scar without significant ischemia. He did have PACs and PVCs in gating was not done.  He  fell on the Fourth of July 2014 and landed on his head. He ultimately was transferred to [facility] where was hospitalized for a week. He did not have neurosurgery. He was told of having "blood on his brain."  He did have a left-sided per hemiparesis. He then spent 2 months at [facility] rehabilitation unit and has spent additional time a [facility] [place] until October 31.  He is significant improved following his head trauma with return of his memory.  Last year, I reduced his beta blocker therapy due to bradycardia.  I last saw him he was doing well and did not have any problems with recurrent chest pain.  I had recommended that this year he  undergo a follow-up nuclear perfusion study to follow his CAD and grafts.  This was done on 11/22/2015 and now showed an ejection fraction of 42% with inferolateral hypokinesis.  There was a new large defect in the inferolateral wall and there was no change in his previous anteroseptal defect.  There was concern for scar/ischemia inferolaterally which was new.  Upon further questioning, Mr. Baker Pierini admits to experiencing episodes of chest tightness over the last month.  He has noticed increasing shortness of breath with activity.  He presents for evaluation.  Past Medical History  Diagnosis Date  . Coronary artery disease   . Diabetes mellitus   . Groin hematoma 02/19/12    RIGHT  . Hyperlipidemia   . Hypertension   . Obesity   . PVC (premature ventricular contraction)   . Chronic kidney disease     CKD STAGE 3;  CHRONIC RENAL INSUFFICIENCY  . Neuropathy due to type 2 diabetes mellitus [facility])     Past Surgical History  Procedure Laterality Date  . Coronary artery bypass graft  1997    LIMA to the LAD and vein to the RCA;  . Cardiac catheterization  01/14/12    LV FXN EF 50-55% W/MILD DISTAL INFERIOR HYPOKINESIS; PCI: LAD PTCA/STENT W/NEW 2.75X22 RESOLUTE DES IN MID LAD POST DIALTED TO 3.08 TO 2.97 TAPER: 99%-80% TO 0; LCX: PTCA VIA LM STENT W/2.25X12 SPRINTER BALLOON: 90%-5; LAD: PATENT PROXIMAL STENT EXTENDING INTO LM W/30-40% SMOOTH NARROWING  IN DISTAL PORTION OF STENT; 99% IN STENT RESTENOSIS OF MID LAD STENT   . Lower venous duplex  02/05/12    ESSENTIALLY NORMAL RIGHT LOWER EXTRIMTY VENOUS DUPLEX DOPPLER EVALUATION  . Lower arterial doppler  01/24/12    ESSENTIALLY NORMAL RIGHT GROIN DUPLEX EVALUATION S/P THROMBIN INJECTION  . Nm myoview ltd  01/09/12    HIGH RISK SCAN. COMPARED TO PREVIOUS STUDY, THERE IS NOW ISCHEMIA PRESENT. ABNORMAL MYOCARDIAL PERFUSION STUDY. THERE IS NEW MILD INFEROLATERAL ISCHEMIA TOWARDS THE BASE; PT TO FOLLOW UP WITH DR. TK  . Doppler echocardiography  01/09/12     LV NORMAL IN SIZE, NORMAL WAL THICKNESS, EF 50-55%; MILD POSTERIOR WALL HYOPKINESIS, THERE IS MILD INFERIOR WALL HYPOKINESIS  . Left heart catheterization with coronary/graft angiogram N/A 01/14/2012    Procedure: LEFT HEART CATHETERIZATION WITH Beatrix Fetters;  Surgeon: Troy Sine, MD;  Location: [facility] CATH LAB;  Service: Cardiovascular;  Laterality: N/A;  . Percutaneous coronary stent intervention (pci-s) N/A 01/14/2012    Procedure: PERCUTANEOUS CORONARY STENT INTERVENTION (PCI-S);  Surgeon: Troy Sine, MD;  Location: [facility] CATH LAB;  Service: Cardiovascular;  Laterality: N/A;    Allergies  Allergen Reactions  . Tramadol Nausea And Vomiting  . Codeine Rash    Current Outpatient Prescriptions  Medication Sig Dispense Refill  . amLODipine (NORVASC) 5 MG tablet Take 1.5 tablets (7.5 mg total) by mouth daily. 45 tablet 11  . aspirin 81 MG tablet Take 81 mg by mouth daily.    Marland Kitchen atorvastatin (LIPITOR) 40 MG tablet   0  . benazepril (LOTENSIN) 40 MG tablet Take 40 mg by mouth daily.    . cholecalciferol (VITAMIN D) 1000 UNITS tablet Take 1,000 Units by mouth daily.    . Dulaglutide (TRULICITY) 2.97 LG/9.2JJ SOPN Inject into the skin. Once weekly on thursday    . insulin aspart (NOVOLOG) 100 UNIT/ML FlexPen Inject 10 Units into the skin 3 (three) times daily with meals.     . Insulin Glargine (LANTUS SOLOSTAR) 100 UNIT/ML Solostar Pen Inject 55 Units into the skin daily at 10 pm.     . LEVEMIR FLEXTOUCH 100 UNIT/ML Pen Inject 35 Units into the skin 2 (two) times daily.   12  . LINZESS 145 MCG CAPS capsule TK 1 C PO D  11  . metFORMIN (GLUCOPHAGE-XR) 750 MG 24 hr tablet Take 750 mg by mouth daily with breakfast.    . metoprolol succinate (TOPROL-XL) 100 MG 24 hr tablet Take 50 mg by mouth daily.    . Multiple Vitamin (MULTIVITAMIN WITH MINERALS) TABS Take 1 tablet by mouth daily.    Marland Kitchen NOVOFINE AUTOCOVER 30G X 8 MM MISC     . ONE TOUCH ULTRA TEST test strip   4  . oxybutynin  (DITROPAN) 5 MG tablet Take 1 tablet by mouth daily.    . polyethylene glycol powder (GLYCOLAX/MIRALAX) powder     . PREVNAR 13 SUSP injection   0  . ranitidine (ZANTAC) 150 MG tablet Take 150 mg by mouth 2 (two) times daily.    Marland Kitchen SYNTHROID 75 MCG tablet 1 tablet daily.    . TRULICITY 1.5 HE/1.7EY SOPN Inject 1.6 mg as directed once a week.  12  . vitamin C (ASCORBIC ACID) 500 MG tablet Take 500 mg by mouth daily.    . Vitamin D, Cholecalciferol, 1000 UNITS CAPS   12  . isosorbide mononitrate (IMDUR) 60 MG 24 hr tablet Take 1 tablet in the morning and 1/2 tablet at night. [place]  tablet 11  . nitroGLYCERIN (NITROSTAT) 0.4 MG SL tablet Place 0.4 mg under the tongue every 5 (five) minutes as needed. For chest pain     No current facility-administered medications for this visit.    Socially, he is married has one child 2 grandchildren and 2 great-grandchildren. There is no tobacco or alcohol use.  ROS General: Negative; No fevers, chills, or night sweats;  HEENT: Positive for cataracts; No changes in hearing, sinus congestion, difficulty swallowing Pulmonary: Negative; No cough, wheezing, shortness of breath, hemoptysis Cardiovascular: See history of present illness;  GI: Positive for GERD on ranitidine; No nausea, vomiting, diarrhea, or abdominal pain GU: Negative; No dysuria, hematuria, or difficulty voiding Musculoskeletal: Negative; no myalgias, joint pain, or weakness Hematologic/Oncology: Negative; no easy bruising, bleeding Endocrine: Positive for hypothyroidism, on Synthroid replacement; diabetes not well controlled Neuro: Negative; no changes in balance, headaches Skin: Negative; No rashes or skin lesions Psychiatric: Negative; No behavioral problems, depression Sleep: Negative; No snoring, daytime sleepiness, hypersomnolence, bruxism, restless legs, hypnogognic hallucinations, no cataplexy Other comprehensive 14 point system review is negative.  PE BP 102/70 mmHg  Pulse 72  Ht 5'  7" (1.702 m)  Wt 203 lb (92.08 kg)  BMI 31.79 kg/m2  Repeat blood pressure by me was 122/80.   Wt Readings from Last 3 Encounters:  11/25/15 203 lb (92.08 kg)  04/22/15 197 lb (89.359 kg)  01/13/15 204 lb 4.8 oz (92.67 kg)   General: Alert, oriented, no distress.  HEENT: Normocephalic, atraumatic. Pupils round and reactive; sclera anicteric; no lid lag. Atrophic muscles intact. No xanthelasmas. Nose without nasal septal hypertrophy Mouth/Parynx benign; Mallinpatti scale 3 Neck: No JVD, no carotid bruits with normal carotid upstroke Chest wall: Nontender to palpation Lungs: clear to ausculatation and percussion; no wheezing or rales Heart: RRR, s1 s2 normal 2/6 sem, unchanged, no S3, gallop; no diastolic murmur.  No rubs, thrills or heaves Abdomen: soft, nontender; no hepatosplenomehaly, BS+; abdominal aorta nontender and not dilated by palpation. Mild central adiposity Pulses 2+ Extremities: Trivial residual edema; no clubbing cyanosis, Homan's sign negative  Neurologic: grossly nonfocal Psychologic: Normal affect and mood  September 2016 ECG (independently read by me):  Normal sinus rhythm at 65 bpm. Mild voltage for LVH.  01/13/2015 ECG (independently read by me): Sinus bradycardia 58 bpm.  Normal intervals.  October 2015 ECG (independent read by me): Sinus bradycardia at 50 bpm.  No ectopy.  PR interval 196 msec,   Borderline LVH.  Prior June 2015 ECG (independently read by me): Sinus bradycardia 52 beats per minute.  Borderline first degree AV block with a PR interval of 200 ms.  December 2014 ECG (independently read by me): Sinus rhythm at 54 beats per minute. No ectopy. Normal intervals.  ECG from 07/08/2013 : reveals sinus rhythm at 57 beats per minute. QTc interval 453 ms.  LABS: BMP Latest Ref Rng 11/25/2015 11/01/2014 01/18/2012  Glucose 65 - 99 mg/dL 174(H) - 223(H)  BUN 7 - 25 mg/dL 20 - 23  Creatinine 0.70 - 1.18 mg/dL 1.46(H) 1.6(A) 1.22  Sodium 135 - 146 mmol/L  140 - 138  Potassium 3.5 - 5.3 mmol/L 4.3 - 3.7  Chloride 98 - 110 mmol/L 103 - 100  CO2 20 - 31 mmol/L 29 - 28  Calcium 8.6 - 10.3 mg/dL 9.1 - 9.1   Hepatic Function Latest Ref Rng 11/25/2015  Total Protein 6.1 - 8.1 g/dL 7.1  Albumin 3.6 - 5.1 g/dL 4.3  AST 10 - 35 U/L 15  ALT  9 - 46 U/L 16  Alk Phosphatase 40 - 115 U/L 58  Total Bilirubin 0.2 - 1.2 mg/dL 0.6   CBC Latest Ref Rng 11/25/2015 01/19/2012 01/18/2012  WBC 3.8 - 10.8 K/uL 8.6 9.1 8.9  Hemoglobin 13.2 - 17.1 g/dL 14.2 10.1(L) 10.2(L)  Hematocrit 38.5 - 50.0 % 41.9 30.1(L) 30.3(L)  Platelets 140 - 400 K/uL 239 190 202   Lab Results  Component Value Date   MCV 89.9 11/25/2015   MCV 88.3 01/19/2012   MCV 88.6 01/18/2012   No results found for: TSH  Lab Results  Component Value Date   HGBA1C 10.7* 11/01/2014   Lipid Panel     Component Value Date/Time   CHOL 138 11/01/2014   HDL 39 11/01/2014     RADIOLOGY: No results found.    ASSESSMENT AND PLAN:  Mr. Aguayo is a 75 year old white male who is status post CABG revascularization surgery in 1997 and has a documented occluded LIMA. He has  previously undergone intervention both to his proximal LAD extending into the left main with stenting and stenting of his mid LAD. In June 2013 he developed severe in-stent restenosis of his LAD stent and a DES stent was in place at that time. We also went through the stent struts in the left main and performed PTCA of the distal circumflex. He had mild/moderate disease in his PDA after his SVG to his PDA insertion. His previous nuclear perfusion study was in June 2014 after experiencing some vague episodes of chest pain was low risk and showed distal anteroseptal scar with the extent of 10% without associated ischemia.  I reviewed his most recent nuclear study, which now reveals a large new defect inferolaterally with scar/ischemia that was not present in 2014.  I showed both he and his wife the scintigraphic images from 2014 as  well as the most recent ones for their better understanding of the significance of this new abnormality.  He has been experiencing episodes of chest pain for several months as well as increasing shortness of breath with exertion.  I am increasing his isosorbide from 30 mg daily to 60 mg in the morning and 30 mg at night.  His blood pressure today is controlled on amlodipine 7.5 mg, benazepril 40 mg and Toprol 50 mg.  I have recommended definitive cardiac catheterization to reevaluate his new abnormality and recurrent symptomatology.  He is 20 years status post CABG revascularization surgery.  He is diabetic on metformin and insulin.  I am scheduling him for a chest x-ray.  Admission laboratory will be obtained.  He has a history of renal insufficiency , which mostly recently was stage III chronic kidney disease and will need adequate hydration.  Catheterization will be planned for as soon as possible within the next week. The risks and benefits of a cardiac catheterization including, but not limited to, death, stroke, MI, kidney damage and bleeding were discussed with the patient who indicates understanding and agrees to proceed.  Time spent: 40 minutes  Troy Sine, MD, [facility]  11/27/2015 12:31 PM

## 2015-11-28 ENCOUNTER — Telehealth: Payer: Self-pay | Admitting: Cardiovascular Disease

## 2015-11-28 NOTE — Telephone Encounter (Signed)
New Messafe  Pt called req a call back to determine if it is time to schedule the CATH//shanti

## 2015-11-29 ENCOUNTER — Encounter: Payer: Self-pay | Admitting: Cardiovascular Disease

## 2015-11-29 ENCOUNTER — Other Ambulatory Visit: Payer: Self-pay | Admitting: *Deleted

## 2015-11-29 DIAGNOSIS — Z01818 Encounter for other preprocedural examination: Secondary | ICD-10-CM

## 2015-11-29 NOTE — Telephone Encounter (Signed)
Returned a call to patient to ask if he stopped at check out when he left last week and he said yes, but they just told him he didn't owe any money and let him go. I told him that I would inform the scheduler that he needs to be set up and call him with the details.

## 2015-11-30 ENCOUNTER — Telehealth: Payer: Self-pay | Admitting: Cardiovascular Disease

## 2015-11-30 NOTE — Telephone Encounter (Signed)
Returned call to pt. He wanted to go over his instructions for his heart cath. Letter for heart cath explained to pt over the phone. Pt verbalized understanding.

## 2015-11-30 NOTE — Telephone Encounter (Signed)
New message   Pt verbalized that he needs to speak to rn about instructions on his upcoming cath surgery

## 2015-12-02 ENCOUNTER — Encounter (HOSPITAL_COMMUNITY): Payer: Self-pay | Admitting: General Practice

## 2015-12-02 ENCOUNTER — Ambulatory Visit (HOSPITAL_COMMUNITY)
Admission: RE | Admit: 2015-12-02 | Discharge: 2015-12-03 | Disposition: A | Discharge status: Home / Self Care | Payer: Medicare Other | Source: Ambulatory Visit | Attending: Cardiovascular Disease | Admitting: Cardiovascular Disease

## 2015-12-02 ENCOUNTER — Encounter (HOSPITAL_COMMUNITY)
Admission: RE | Disposition: A | Discharge status: Home / Self Care | Payer: Self-pay | Source: Ambulatory Visit | Attending: Cardiovascular Disease

## 2015-12-02 DIAGNOSIS — I2510 Atherosclerotic heart disease of native coronary artery without angina pectoris: Secondary | ICD-10-CM | POA: Diagnosis not present

## 2015-12-02 DIAGNOSIS — Z794 Long term (current) use of insulin: Secondary | ICD-10-CM | POA: Insufficient documentation

## 2015-12-02 DIAGNOSIS — Z7982 Long term (current) use of aspirin: Secondary | ICD-10-CM | POA: Diagnosis not present

## 2015-12-02 DIAGNOSIS — I129 Hypertensive chronic kidney disease with stage 1 through stage 4 chronic kidney disease, or unspecified chronic kidney disease: Secondary | ICD-10-CM | POA: Diagnosis not present

## 2015-12-02 DIAGNOSIS — Z6831 Body mass index (BMI) 31.0-31.9, adult: Secondary | ICD-10-CM | POA: Insufficient documentation

## 2015-12-02 DIAGNOSIS — N183 Chronic kidney disease, stage 3 (moderate): Secondary | ICD-10-CM | POA: Diagnosis not present

## 2015-12-02 DIAGNOSIS — Z7902 Long term (current) use of antithrombotics/antiplatelets: Secondary | ICD-10-CM | POA: Insufficient documentation

## 2015-12-02 DIAGNOSIS — E669 Obesity, unspecified: Secondary | ICD-10-CM | POA: Insufficient documentation

## 2015-12-02 DIAGNOSIS — I25810 Atherosclerosis of coronary artery bypass graft(s) without angina pectoris: Secondary | ICD-10-CM

## 2015-12-02 DIAGNOSIS — E1140 Type 2 diabetes mellitus with diabetic neuropathy, unspecified: Secondary | ICD-10-CM | POA: Diagnosis not present

## 2015-12-02 DIAGNOSIS — E1122 Type 2 diabetes mellitus with diabetic chronic kidney disease: Secondary | ICD-10-CM | POA: Diagnosis not present

## 2015-12-02 DIAGNOSIS — Z955 Presence of coronary angioplasty implant and graft: Secondary | ICD-10-CM | POA: Diagnosis not present

## 2015-12-02 DIAGNOSIS — Z01818 Encounter for other preprocedural examination: Secondary | ICD-10-CM

## 2015-12-02 DIAGNOSIS — E1142 Type 2 diabetes mellitus with diabetic polyneuropathy: Secondary | ICD-10-CM

## 2015-12-02 DIAGNOSIS — E785 Hyperlipidemia, unspecified: Secondary | ICD-10-CM | POA: Diagnosis not present

## 2015-12-02 HISTORY — DX: Unspecified osteoarthritis, unspecified site: M19.90

## 2015-12-02 HISTORY — PX: CARDIAC CATHETERIZATION: SHX172

## 2015-12-02 HISTORY — PX: CORONARY STENT PLACEMENT: SHX1402

## 2015-12-02 LAB — GLUCOSE, CAPILLARY
Glucose-Capillary: 184 mg/dL — ABNORMAL HIGH (ref 65–99)
Glucose-Capillary: 141 mg/dL — ABNORMAL HIGH (ref 65–99)
Glucose-Capillary: 180 mg/dL — ABNORMAL HIGH (ref 65–99)
Glucose-Capillary: 201 mg/dL — ABNORMAL HIGH (ref 65–99)

## 2015-12-02 LAB — TROPONIN I: Troponin I: <0.03 ng/mL (ref <0.031)

## 2015-12-02 LAB — POCT ACTIVATED CLOTTING TIME: ACTIVATED CLOTTING TIME: 523 s

## 2015-12-02 SURGERY — LEFT HEART CATH AND CORONARY ANGIOGRAPHY
Anesthesia: LOCAL

## 2015-12-02 MED ORDER — IOPAMIDOL (ISOVUE-370) INJECTION 76%
INTRAVENOUS | Status: AC
  Filled 2015-12-02: qty 100

## 2015-12-02 MED ORDER — ISOSORBIDE MONONITRATE ER 60 MG PO TB24
60.0000 mg | ORAL_TABLET | Freq: Every day | ORAL | Status: DC
Start: 2015-12-03 — End: 2015-12-03
  Administered 2015-12-03: 60 mg via ORAL
  Filled 2015-12-02: qty 1

## 2015-12-02 MED ORDER — CLOPIDOGREL BISULFATE 300 MG PO TABS
ORAL_TABLET | ORAL | Status: DC | PRN
  Administered 2015-12-02: 300 mg via ORAL

## 2015-12-02 MED ORDER — INSULIN DETEMIR 100 UNIT/ML ~~LOC~~ SOLN
35.0000 [IU] | Freq: Two times a day (BID) | SUBCUTANEOUS | Status: DC
  Administered 2015-12-02 – 2015-12-03 (×2): 35 [IU] via SUBCUTANEOUS
  Filled 2015-12-02 (×3): qty 0.35

## 2015-12-02 MED ORDER — FENTANYL CITRATE (PF) 100 MCG/2ML IJ SOLN
INTRAMUSCULAR | Status: AC
  Filled 2015-12-02: qty 2

## 2015-12-02 MED ORDER — ANGIOPLASTY BOOK
Freq: Once | Status: AC
Start: 2015-12-02 — End: 2015-12-02
  Administered 2015-12-02: 22:00:00
  Filled 2015-12-02: qty 1

## 2015-12-02 MED ORDER — VITAMIN D 1000 UNITS PO TABS
1000.0000 [IU] | ORAL_TABLET | Freq: Every day | ORAL | Status: DC
  Administered 2015-12-03: 11:00:00 1000 [IU] via ORAL
  Filled 2015-12-02: qty 1

## 2015-12-02 MED ORDER — ADULT MULTIVITAMIN W/MINERALS CH
1.0000 | ORAL_TABLET | Freq: Every day | ORAL | Status: DC
  Administered 2015-12-03: 1 via ORAL
  Filled 2015-12-02: qty 1

## 2015-12-02 MED ORDER — HYDRALAZINE HCL 20 MG/ML IJ SOLN
10.0000 mg | Freq: Once | INTRAMUSCULAR | Status: AC
  Administered 2015-12-02: 10 mg via INTRAVENOUS

## 2015-12-02 MED ORDER — MIDAZOLAM HCL 2 MG/2ML IJ SOLN
INTRAMUSCULAR | Status: DC | PRN
  Administered 2015-12-02 (×3): 1 mg via INTRAVENOUS

## 2015-12-02 MED ORDER — BENAZEPRIL HCL 20 MG PO TABS
40.0000 mg | ORAL_TABLET | Freq: Every day | ORAL | Status: DC
  Administered 2015-12-03: 11:00:00 40 mg via ORAL
  Filled 2015-12-02: qty 2

## 2015-12-02 MED ORDER — LABETALOL HCL 5 MG/ML IV SOLN
INTRAVENOUS | Status: AC
Start: 2015-12-02 — End: 2015-12-02
  Filled 2015-12-02: qty 4

## 2015-12-02 MED ORDER — NITROGLYCERIN 0.4 MG SL SUBL: 0.4000 mg | SUBLINGUAL_TABLET | SUBLINGUAL | Status: DC | PRN

## 2015-12-02 MED ORDER — ASPIRIN 81 MG PO TABS: 81.0000 mg | ORAL_TABLET | Freq: Every day | ORAL | Status: DC

## 2015-12-02 MED ORDER — CLOPIDOGREL BISULFATE 300 MG PO TABS
ORAL_TABLET | ORAL | Status: AC
  Filled 2015-12-02: qty 2

## 2015-12-02 MED ORDER — CLOPIDOGREL BISULFATE 75 MG PO TABS
75.0000 mg | ORAL_TABLET | Freq: Every day | ORAL | Status: DC
  Administered 2015-12-03: 10:00:00 75 mg via ORAL
  Filled 2015-12-02: qty 1

## 2015-12-02 MED ORDER — SODIUM CHLORIDE 0.9 % IV SOLN
INTRAVENOUS | Status: DC
  Administered 2015-12-02: 09:00:00 via INTRAVENOUS

## 2015-12-02 MED ORDER — ASPIRIN 81 MG PO CHEW: 81.0000 mg | CHEWABLE_TABLET | ORAL | Status: DC

## 2015-12-02 MED ORDER — FENTANYL CITRATE (PF) 100 MCG/2ML IJ SOLN
INTRAMUSCULAR | Status: DC | PRN
  Administered 2015-12-02 (×3): 25 ug via INTRAVENOUS

## 2015-12-02 MED ORDER — INSULIN ASPART 100 UNIT/ML ~~LOC~~ SOLN
12.0000 [IU] | Freq: Three times a day (TID) | SUBCUTANEOUS | Status: DC
  Administered 2015-12-03: 12 [IU] via SUBCUTANEOUS

## 2015-12-02 MED ORDER — ATORVASTATIN CALCIUM 40 MG PO TABS: 40.0000 mg | ORAL_TABLET | Freq: Every day | ORAL | Status: DC

## 2015-12-02 MED ORDER — SODIUM CHLORIDE 0.9 % IV SOLN: 250.0000 mL | INTRAVENOUS | Status: DC | PRN

## 2015-12-02 MED ORDER — HYDRALAZINE HCL 20 MG/ML IJ SOLN
10.0000 mg | INTRAMUSCULAR | Status: DC | PRN
  Administered 2015-12-02: 10 mg via INTRAVENOUS
  Filled 2015-12-02 (×2): qty 1

## 2015-12-02 MED ORDER — NITROGLYCERIN 1 MG/10 ML FOR IR/CATH LAB
INTRA_ARTERIAL | Status: DC | PRN
  Administered 2015-12-02 (×3): 200 ug via INTRACORONARY

## 2015-12-02 MED ORDER — IOPAMIDOL (ISOVUE-370) INJECTION 76%
INTRAVENOUS | Status: AC
  Filled 2015-12-02: qty 125

## 2015-12-02 MED ORDER — ACETAMINOPHEN 325 MG PO TABS
650.0000 mg | ORAL_TABLET | ORAL | Status: DC | PRN
  Administered 2015-12-02: 14:00:00 650 mg via ORAL
  Filled 2015-12-02: qty 2

## 2015-12-02 MED ORDER — SODIUM CHLORIDE 0.9% FLUSH: 3.0000 mL | INTRAVENOUS | Status: DC | PRN

## 2015-12-02 MED ORDER — SODIUM CHLORIDE 0.9% FLUSH
3.0000 mL | Freq: Two times a day (BID) | INTRAVENOUS | Status: DC
  Administered 2015-12-02 – 2015-12-03 (×2): 3 mL via INTRAVENOUS

## 2015-12-02 MED ORDER — ATROPINE SULFATE 1 MG/10ML IJ SOSY
PREFILLED_SYRINGE | INTRAMUSCULAR | Status: AC
  Filled 2015-12-02: qty 10

## 2015-12-02 MED ORDER — HEPARIN (PORCINE) IN NACL 2-0.9 UNIT/ML-% IJ SOLN
INTRAMUSCULAR | Status: AC
  Filled 2015-12-02: qty 1000

## 2015-12-02 MED ORDER — VITAMIN C 500 MG PO TABS
500.0000 mg | ORAL_TABLET | Freq: Every day | ORAL | Status: DC
  Administered 2015-12-03: 500 mg via ORAL
  Filled 2015-12-02: qty 1

## 2015-12-02 MED ORDER — LABETALOL HCL 5 MG/ML IV SOLN
20.0000 mg | Freq: Once | INTRAVENOUS | Status: AC
  Administered 2015-12-02: 20 mg via INTRAVENOUS

## 2015-12-02 MED ORDER — METOPROLOL SUCCINATE ER 50 MG PO TB24
50.0000 mg | ORAL_TABLET | Freq: Every day | ORAL | Status: DC
  Administered 2015-12-03: 50 mg via ORAL
  Filled 2015-12-02: qty 1

## 2015-12-02 MED ORDER — LIDOCAINE HCL (PF) 1 % IJ SOLN
INTRAMUSCULAR | Status: DC | PRN
  Administered 2015-12-02: 18 mL

## 2015-12-02 MED ORDER — HEPARIN (PORCINE) IN NACL 2-0.9 UNIT/ML-% IJ SOLN
INTRAMUSCULAR | Status: AC
  Filled 2015-12-02: qty 500

## 2015-12-02 MED ORDER — MIDAZOLAM HCL 2 MG/2ML IJ SOLN
INTRAMUSCULAR | Status: AC
  Filled 2015-12-02: qty 2

## 2015-12-02 MED ORDER — ASPIRIN EC 81 MG PO TBEC
81.0000 mg | DELAYED_RELEASE_TABLET | Freq: Every day | ORAL | Status: DC
  Administered 2015-12-03: 11:00:00 81 mg via ORAL
  Filled 2015-12-02: qty 1

## 2015-12-02 MED ORDER — HEPARIN (PORCINE) IN NACL 2-0.9 UNIT/ML-% IJ SOLN
INTRAMUSCULAR | Status: DC | PRN
  Administered 2015-12-02: 2000 mL

## 2015-12-02 MED ORDER — ONDANSETRON HCL 4 MG/2ML IJ SOLN: 4.0000 mg | Freq: Four times a day (QID) | INTRAMUSCULAR | Status: DC | PRN

## 2015-12-02 MED ORDER — METFORMIN HCL ER 750 MG PO TB24
750.0000 mg | ORAL_TABLET | Freq: Every day | ORAL | Status: DC
  Filled 2015-12-02: qty 1

## 2015-12-02 MED ORDER — ZOLPIDEM TARTRATE 5 MG PO TABS: 5.0000 mg | ORAL_TABLET | Freq: Every evening | ORAL | Status: DC | PRN

## 2015-12-02 MED ORDER — BIVALIRUDIN 250 MG IV SOLR
INTRAVENOUS | Status: AC
Start: 2015-12-02 — End: 2015-12-02
  Filled 2015-12-02: qty 250

## 2015-12-02 MED ORDER — FAMOTIDINE 20 MG PO TABS
20.0000 mg | ORAL_TABLET | Freq: Every day | ORAL | Status: DC
  Administered 2015-12-03: 11:00:00 20 mg via ORAL
  Filled 2015-12-02: qty 1

## 2015-12-02 MED ORDER — LIDOCAINE HCL (PF) 1 % IJ SOLN
INTRAMUSCULAR | Status: AC
  Filled 2015-12-02: qty 30

## 2015-12-02 MED ORDER — LEVOTHYROXINE SODIUM 75 MCG PO TABS
75.0000 ug | ORAL_TABLET | Freq: Every day | ORAL | Status: DC
  Administered 2015-12-03: 75 ug via ORAL
  Filled 2015-12-02: qty 1

## 2015-12-02 MED ORDER — IOPAMIDOL (ISOVUE-370) INJECTION 76%
INTRAVENOUS | Status: DC | PRN
  Administered 2015-12-02: 190 mL via INTRAVENOUS

## 2015-12-02 MED ORDER — SODIUM CHLORIDE 0.9% FLUSH: 3.0000 mL | Freq: Two times a day (BID) | INTRAVENOUS | Status: DC

## 2015-12-02 MED ORDER — SODIUM CHLORIDE 0.9 % IV SOLN
250.0000 mg | INTRAVENOUS | Status: DC | PRN
  Administered 2015-12-02: 1.75 mg/kg/h via INTRAVENOUS

## 2015-12-02 MED ORDER — NITROGLYCERIN 1 MG/10 ML FOR IR/CATH LAB
INTRA_ARTERIAL | Status: AC
  Filled 2015-12-02: qty 10

## 2015-12-02 MED ORDER — BIVALIRUDIN BOLUS VIA INFUSION - CUPID
INTRAVENOUS | Status: DC | PRN
  Administered 2015-12-02: 66.375 mg via INTRAVENOUS

## 2015-12-02 MED ORDER — SODIUM CHLORIDE 0.9 % IV SOLN
INTRAVENOUS | Status: DC
  Administered 2015-12-02: 18:00:00 via INTRAVENOUS

## 2015-12-02 MED ORDER — OXYBUTYNIN CHLORIDE 5 MG PO TABS
5.0000 mg | ORAL_TABLET | Freq: Every day | ORAL | Status: DC
  Administered 2015-12-03: 5 mg via ORAL
  Filled 2015-12-02: qty 1

## 2015-12-02 MED ORDER — AMLODIPINE BESYLATE 5 MG PO TABS
7.5000 mg | ORAL_TABLET | Freq: Every day | ORAL | Status: DC
  Administered 2015-12-03: 07:00:00 7.5 mg via ORAL
  Filled 2015-12-02: qty 2

## 2015-12-02 MED ORDER — DULAGLUTIDE 1.5 MG/0.5ML ~~LOC~~ SOAJ: 1.5000 mg | SUBCUTANEOUS | Status: DC

## 2015-12-02 MED ORDER — DIAZEPAM 5 MG PO TABS: 5.0000 mg | ORAL_TABLET | ORAL | Status: DC

## 2015-12-02 SURGICAL SUPPLY — 17 items
BALLN EUPHORA RX 2.0X15 (BALLOONS) ×2
BALLOON EUPHORA RX 2.0X15 (BALLOONS) ×1 IMPLANT
BIOFREEDOM 2.25X18 (Permanent Stent) ×2 IMPLANT
CATH INFINITI 5 FR RCB (CATHETERS) ×2 IMPLANT
CATH INFINITI 5FR JL5 (CATHETERS) ×2 IMPLANT
CATH INFINITI MULTIPACK ST 5F (CATHETERS) ×2 IMPLANT
CATH VISTA GUIDE RCB (CATHETERS) ×2 IMPLANT
KIT ENCORE 26 ADVANTAGE (KITS) ×2 IMPLANT
KIT HEART LEFT (KITS) ×2 IMPLANT
PACK CARDIAC CATHETERIZATION (CUSTOM PROCEDURE TRAY) ×2 IMPLANT
SHEATH PINNACLE 5F 10CM (SHEATH) ×2 IMPLANT
SHEATH PINNACLE 6F 10CM (SHEATH) ×2 IMPLANT
TRANSDUCER W/STOPCOCK (MISCELLANEOUS) ×2 IMPLANT
TUBING CIL FLEX 10 FLL-RA (TUBING) ×2 IMPLANT
WIRE ASAHI PROWATER 180CM (WIRE) ×2 IMPLANT
WIRE EMERALD 3MM-J .035X150CM (WIRE) ×2 IMPLANT
WIRE PT2 MS 185 (WIRE) ×2 IMPLANT

## 2015-12-02 NOTE — Research (Signed)
LEADERS FREE Informed Consent   Subject Name: Roberto Haley  Subject met inclusion and exclusion criteria.  The informed consent form, study requirements and expectations were reviewed with the subject and questions and concerns were addressed prior to the signing of the consent form.  The subject verbalized understanding of the trail requirements.  The subject agreed to participate in the LEADERS FREE trial and signed the informed consent.  The informed consent was obtained prior to performance of any protocol-specific procedures for the subject.  A copy of the signed informed consent was given to the subject and a copy was placed in the subject's medical record. If the patient meets angiographic criteria and requires a stent then will be enrolled in the study.  [name] 12/02/2015, [phone]

## 2015-12-02 NOTE — H&P (View-Only) (Signed)
Patient ID: ELIGE SHOUSE, male   DOB: 1941/02/03, 75 y.o.   MRN: [id number]     Primary M.D.: Dr. Nelda Bucks  HPI: Roberto Haley is a 75 y.o. male who presents to the office for a 7 month cardiology followup evaluation.   Roberto Haley has known CAD and underwent CABG revascularization surgery in 1997 with LIMA to LAD, vein to the RCA. In March 1998 he underwent complex intervention to his native LAD and high speed rotational atherectomy (HSRA) and stenting of his LAD and PTCA of his diagonal vessel after he was found to have stenosis in the LIMA graft. In June 2013 due to 99% in-stent restenosis in the LAD stent and a new stenosis of 90% the distal circumflex, he underwent two-vessel intervention with insertion of a 2.75x22 mm resident stent in the LAD and PTCA of the distal circumflex via his previously placed stent which started in the mid left main coronary artery. A cardiopulmonary met test showed reduced maximum oxygen consumption. I  reduced his beta blocker therapy due to significant chronotropic incompetence and further titrated his Ranexa to 1000 mg twice a day.  A nuclear perfusion study in June 2014 showed distal anteroseptal scar without significant ischemia. He did have PACs and PVCs in gating was not done.  He  fell on the Fourth of July 2014 and landed on his head. He ultimately was transferred to [facility] where was hospitalized for a week. He did not have neurosurgery. He was told of having "blood on his brain."  He did have a left-sided per hemiparesis. He then spent 2 months at [facility] rehabilitation unit and has spent additional time a universal rehabilitation in [place] until October 31.  He is significant improved following his head trauma with return of his memory.  Last year, I reduced his beta blocker therapy due to bradycardia.  I last saw him he was doing well and did not have any problems with recurrent chest pain.  I had recommended that this year he  undergo a follow-up nuclear perfusion study to follow his CAD and grafts.  This was done on 11/22/2015 and now showed an ejection fraction of 42% with inferolateral hypokinesis.  There was a new large defect in the inferolateral wall and there was no change in his previous anteroseptal defect.  There was concern for scar/ischemia inferolaterally which was new.  Upon further questioning, Roberto Haley admits to experiencing episodes of chest tightness over the last month.  He has noticed increasing shortness of breath with activity.  He presents for evaluation.  Past Medical History  Diagnosis Date  . Coronary artery disease   . Diabetes mellitus   . Groin hematoma 02/19/12    RIGHT  . Hyperlipidemia   . Hypertension   . Obesity   . PVC (premature ventricular contraction)   . Chronic kidney disease     CKD STAGE 3;  CHRONIC RENAL INSUFFICIENCY  . Neuropathy due to type 2 diabetes mellitus [facility])     Past Surgical History  Procedure Laterality Date  . Coronary artery bypass graft  1997    LIMA to the LAD and vein to the RCA;  . Cardiac catheterization  01/14/12    LV FXN EF 50-55% W/MILD DISTAL INFERIOR HYPOKINESIS; PCI: LAD PTCA/STENT W/NEW 2.75X22 RESOLUTE DES IN MID LAD POST DIALTED TO 3.08 TO 2.97 TAPER: 99%-80% TO 0; LCX: PTCA VIA LM STENT W/2.25X12 SPRINTER BALLOON: 90%-5; LAD: PATENT PROXIMAL STENT EXTENDING INTO LM W/30-40% SMOOTH NARROWING  IN DISTAL PORTION OF STENT; 99% IN STENT RESTENOSIS OF MID LAD STENT   . Lower venous duplex  02/05/12    ESSENTIALLY NORMAL RIGHT LOWER EXTRIMTY VENOUS DUPLEX DOPPLER EVALUATION  . Lower arterial doppler  01/24/12    ESSENTIALLY NORMAL RIGHT GROIN DUPLEX EVALUATION S/P THROMBIN INJECTION  . Nm myoview ltd  01/09/12    HIGH RISK SCAN. COMPARED TO PREVIOUS STUDY, THERE IS NOW ISCHEMIA PRESENT. ABNORMAL MYOCARDIAL PERFUSION STUDY. THERE IS NEW MILD INFEROLATERAL ISCHEMIA TOWARDS THE BASE; PT TO FOLLOW UP WITH DR. TK  . Doppler echocardiography  01/09/12     LV NORMAL IN SIZE, NORMAL WAL THICKNESS, EF 50-55%; MILD POSTERIOR WALL HYOPKINESIS, THERE IS MILD INFERIOR WALL HYPOKINESIS  . Left heart catheterization with coronary/graft angiogram N/A 01/14/2012    Procedure: LEFT HEART CATHETERIZATION WITH Beatrix Fetters;  Surgeon: Troy Sine, MD;  Location: [facility] CATH LAB;  Service: Cardiovascular;  Laterality: N/A;  . Percutaneous coronary stent intervention (pci-s) N/A 01/14/2012    Procedure: PERCUTANEOUS CORONARY STENT INTERVENTION (PCI-S);  Surgeon: Troy Sine, MD;  Location: [facility] CATH LAB;  Service: Cardiovascular;  Laterality: N/A;    Allergies  Allergen Reactions  . Tramadol Nausea And Vomiting  . Codeine Rash    Current Outpatient Prescriptions  Medication Sig Dispense Refill  . amLODipine (NORVASC) 5 MG tablet Take 1.5 tablets (7.5 mg total) by mouth daily. 45 tablet 11  . aspirin 81 MG tablet Take 81 mg by mouth daily.    Marland Kitchen atorvastatin (LIPITOR) 40 MG tablet   0  . benazepril (LOTENSIN) 40 MG tablet Take 40 mg by mouth daily.    . cholecalciferol (VITAMIN D) 1000 UNITS tablet Take 1,000 Units by mouth daily.    . Dulaglutide (TRULICITY) 0.99 IP/3.8SN SOPN Inject into the skin. Once weekly on thursday    . insulin aspart (NOVOLOG) 100 UNIT/ML FlexPen Inject 10 Units into the skin 3 (three) times daily with meals.     . Insulin Glargine (LANTUS SOLOSTAR) 100 UNIT/ML Solostar Pen Inject 55 Units into the skin daily at 10 pm.     . LEVEMIR FLEXTOUCH 100 UNIT/ML Pen Inject 35 Units into the skin 2 (two) times daily.   12  . LINZESS 145 MCG CAPS capsule TK 1 C PO D  11  . metFORMIN (GLUCOPHAGE-XR) 750 MG 24 hr tablet Take 750 mg by mouth daily with breakfast.    . metoprolol succinate (TOPROL-XL) 100 MG 24 hr tablet Take 50 mg by mouth daily.    . Multiple Vitamin (MULTIVITAMIN WITH MINERALS) TABS Take 1 tablet by mouth daily.    Marland Kitchen NOVOFINE AUTOCOVER 30G X 8 MM MISC     . ONE TOUCH ULTRA TEST test strip   4  . oxybutynin  (DITROPAN) 5 MG tablet Take 1 tablet by mouth daily.    . polyethylene glycol powder (GLYCOLAX/MIRALAX) powder     . PREVNAR 13 SUSP injection   0  . ranitidine (ZANTAC) 150 MG tablet Take 150 mg by mouth 2 (two) times daily.    Marland Kitchen SYNTHROID 75 MCG tablet 1 tablet daily.    . TRULICITY 1.5 KN/3.9JQ SOPN Inject 1.6 mg as directed once a week.  12  . vitamin C (ASCORBIC ACID) 500 MG tablet Take 500 mg by mouth daily.    . Vitamin D, Cholecalciferol, 1000 UNITS CAPS   12  . isosorbide mononitrate (IMDUR) 60 MG 24 hr tablet Take 1 tablet in the morning and 1/2 tablet at night. [place]  tablet 11  . nitroGLYCERIN (NITROSTAT) 0.4 MG SL tablet Place 0.4 mg under the tongue every 5 (five) minutes as needed. For chest pain     No current facility-administered medications for this visit.    Socially, he is married has one child 2 grandchildren and 2 great-grandchildren. There is no tobacco or alcohol use.  ROS General: Negative; No fevers, chills, or night sweats;  HEENT: Positive for cataracts; No changes in hearing, sinus congestion, difficulty swallowing Pulmonary: Negative; No cough, wheezing, shortness of breath, hemoptysis Cardiovascular: See history of present illness;  GI: Positive for GERD on ranitidine; No nausea, vomiting, diarrhea, or abdominal pain GU: Negative; No dysuria, hematuria, or difficulty voiding Musculoskeletal: Negative; no myalgias, joint pain, or weakness Hematologic/Oncology: Negative; no easy bruising, bleeding Endocrine: Positive for hypothyroidism, on Synthroid replacement; diabetes not well controlled Neuro: Negative; no changes in balance, headaches Skin: Negative; No rashes or skin lesions Psychiatric: Negative; No behavioral problems, depression Sleep: Negative; No snoring, daytime sleepiness, hypersomnolence, bruxism, restless legs, hypnogognic hallucinations, no cataplexy Other comprehensive 14 point system review is negative.  PE BP 102/70 mmHg  Pulse 72  Ht 5'  7" (1.702 m)  Wt 203 lb (92.08 kg)  BMI 31.79 kg/m2  Repeat blood pressure by me was 122/80.   Wt Readings from Last 3 Encounters:  11/25/15 203 lb (92.08 kg)  04/22/15 197 lb (89.359 kg)  01/13/15 204 lb 4.8 oz (92.67 kg)   General: Alert, oriented, no distress.  HEENT: Normocephalic, atraumatic. Pupils round and reactive; sclera anicteric; no lid lag. Atrophic muscles intact. No xanthelasmas. Nose without nasal septal hypertrophy Mouth/Parynx benign; Mallinpatti scale 3 Neck: No JVD, no carotid bruits with normal carotid upstroke Chest wall: Nontender to palpation Lungs: clear to ausculatation and percussion; no wheezing or rales Heart: RRR, s1 s2 normal 2/6 sem, unchanged, no S3, gallop; no diastolic murmur.  No rubs, thrills or heaves Abdomen: soft, nontender; no hepatosplenomehaly, BS+; abdominal aorta nontender and not dilated by palpation. Mild central adiposity Pulses 2+ Extremities: Trivial residual edema; no clubbing cyanosis, Homan's sign negative  Neurologic: grossly nonfocal Psychologic: Normal affect and mood  September 2016 ECG (independently read by me):  Normal sinus rhythm at 65 bpm. Mild voltage for LVH.  01/13/2015 ECG (independently read by me): Sinus bradycardia 58 bpm.  Normal intervals.  October 2015 ECG (independent read by me): Sinus bradycardia at 50 bpm.  No ectopy.  PR interval 196 msec,   Borderline LVH.  Prior June 2015 ECG (independently read by me): Sinus bradycardia 52 beats per minute.  Borderline first degree AV block with a PR interval of 200 ms.  December 2014 ECG (independently read by me): Sinus rhythm at 54 beats per minute. No ectopy. Normal intervals.  ECG from 07/08/2013 : reveals sinus rhythm at 57 beats per minute. QTc interval 453 ms.  LABS: BMP Latest Ref Rng 11/25/2015 11/01/2014 01/18/2012  Glucose 65 - 99 mg/dL 174(H) - 223(H)  BUN 7 - 25 mg/dL 20 - 23  Creatinine 0.70 - 1.18 mg/dL 1.46(H) 1.6(A) 1.22  Sodium 135 - 146 mmol/L  140 - 138  Potassium 3.5 - 5.3 mmol/L 4.3 - 3.7  Chloride 98 - 110 mmol/L 103 - 100  CO2 20 - 31 mmol/L 29 - 28  Calcium 8.6 - 10.3 mg/dL 9.1 - 9.1   Hepatic Function Latest Ref Rng 11/25/2015  Total Protein 6.1 - 8.1 g/dL 7.1  Albumin 3.6 - 5.1 g/dL 4.3  AST 10 - 35 U/L 15  ALT  9 - 46 U/L 16  Alk Phosphatase 40 - 115 U/L 58  Total Bilirubin 0.2 - 1.2 mg/dL 0.6   CBC Latest Ref Rng 11/25/2015 01/19/2012 01/18/2012  WBC 3.8 - 10.8 K/uL 8.6 9.1 8.9  Hemoglobin 13.2 - 17.1 g/dL 14.2 10.1(L) 10.2(L)  Hematocrit 38.5 - 50.0 % 41.9 30.1(L) 30.3(L)  Platelets 140 - 400 K/uL 239 190 202   Lab Results  Component Value Date   MCV 89.9 11/25/2015   MCV 88.3 01/19/2012   MCV 88.6 01/18/2012   No results found for: TSH  Lab Results  Component Value Date   HGBA1C 10.7* 11/01/2014   Lipid Panel     Component Value Date/Time   CHOL 138 11/01/2014   HDL 39 11/01/2014     RADIOLOGY: No results found.    ASSESSMENT AND PLAN:  Roberto Haley is a 75 year old white male who is status post CABG revascularization surgery in 1997 and has a documented occluded LIMA. He has  previously undergone intervention both to his proximal LAD extending into the left main with stenting and stenting of his mid LAD. In June 2013 he developed severe in-stent restenosis of his LAD stent and a DES stent was in place at that time. We also went through the stent struts in the left main and performed PTCA of the distal circumflex. He had mild/moderate disease in his PDA after his SVG to his PDA insertion. His previous nuclear perfusion study was in June 2014 after experiencing some vague episodes of chest pain was low risk and showed distal anteroseptal scar with the extent of 10% without associated ischemia.  I reviewed his most recent nuclear study, which now reveals a large new defect inferolaterally with scar/ischemia that was not present in 2014.  I showed both he and his wife the scintigraphic images from 2014 as  well as the most recent ones for their better understanding of the significance of this new abnormality.  He has been experiencing episodes of chest pain for several months as well as increasing shortness of breath with exertion.  I am increasing his isosorbide from 30 mg daily to 60 mg in the morning and 30 mg at night.  His blood pressure today is controlled on amlodipine 7.5 mg, benazepril 40 mg and Toprol 50 mg.  I have recommended definitive cardiac catheterization to reevaluate his new abnormality and recurrent symptomatology.  He is 20 years status post CABG revascularization surgery.  He is diabetic on metformin and insulin.  I am scheduling him for a chest x-ray.  Admission laboratory will be obtained.  He has a history of renal insufficiency , which mostly recently was stage III chronic kidney disease and will need adequate hydration.  Catheterization will be planned for as soon as possible within the next week. The risks and benefits of a cardiac catheterization including, but not limited to, death, stroke, MI, kidney damage and bleeding were discussed with the patient who indicates understanding and agrees to proceed.  Time spent: 40 minutes  Troy Sine, MD, [facility]  11/27/2015 12:31 PM

## 2015-12-02 NOTE — Care Management Note (Signed)
Case Management Note  Patient Details  Name: Roberto Haley MRN: [id number] Date of Birth: 1941/07/28  Subjective/Objective:    Patient s/pCoronary Stent Intervention, from home, pta indep. NCM will cont to follow for dc needs.                  Action/Plan:   Expected Discharge Date:                  Expected Discharge Plan:  Home/Self Care  In-House Referral:     Discharge planning Services  CM Consult  Post Acute Care Choice:    Choice offered to:     DME Arranged:    DME Agency:     HH Arranged:    [place] Agency:     Status of Service:  Completed, signed off  Medicare Important Message Given:    Date Medicare IM Given:    Medicare IM give by:    Date Additional Medicare IM Given:    Additional Medicare Important Message give by:     If discussed at [place] of Stay Meetings, dates discussed:    Additional Comments:  Zenon Mayo, RN 12/02/2015, 2:31 PM

## 2015-12-02 NOTE — Progress Notes (Signed)
Site area: right groin  Site Prior to Removal:  Level 0  Pressure Applied For 20 MINUTES    Minutes Beginning at 20:15  Manual:   Yes.    Patient Status During Pull:  WNL  Post Pull Groin Site:  Level 0  Post Pull Instructions Given:  Yes.    Post Pull Pulses Present:  Yes.    Dressing Applied:  Yes.    Comments:

## 2015-12-02 NOTE — Interval H&P Note (Signed)
History and Physical Interval Note:  12/02/2015 10:54 AM Cath Lab Visit (complete for each Cath Lab visit)  Clinical Evaluation Leading to the Procedure:   ACS: No.  Non-ACS:    Anginal Classification: CCS III  Anti-ischemic medical therapy: Maximal Therapy (2 or more classes of medications)  Non-Invasive Test Results: High-risk stress test findings: cardiac mortality >3%/year  Prior CABG: Previous CABG       Roberto Haley  has presented today for surgery, with the diagnosis of c/p  The various methods of treatment have been discussed with the patient and family. After consideration of risks, benefits and other options for treatment, the patient has consented to  Procedure(s): Left Heart Cath and Coronary Angiography (N/A) as a surgical intervention .  The patient's history has been reviewed, patient examined, no change in status, stable for surgery.  I have reviewed the patient's chart and labs.  Questions were answered to the patient's satisfaction.     Filicia Scogin A

## 2015-12-03 DIAGNOSIS — I2510 Atherosclerotic heart disease of native coronary artery without angina pectoris: Secondary | ICD-10-CM | POA: Diagnosis not present

## 2015-12-03 DIAGNOSIS — E785 Hyperlipidemia, unspecified: Secondary | ICD-10-CM | POA: Diagnosis not present

## 2015-12-03 DIAGNOSIS — Z7902 Long term (current) use of antithrombotics/antiplatelets: Secondary | ICD-10-CM | POA: Diagnosis not present

## 2015-12-03 DIAGNOSIS — Z955 Presence of coronary angioplasty implant and graft: Secondary | ICD-10-CM | POA: Diagnosis not present

## 2015-12-03 DIAGNOSIS — I129 Hypertensive chronic kidney disease with stage 1 through stage 4 chronic kidney disease, or unspecified chronic kidney disease: Secondary | ICD-10-CM | POA: Diagnosis not present

## 2015-12-03 DIAGNOSIS — N183 Chronic kidney disease, stage 3 (moderate): Secondary | ICD-10-CM | POA: Diagnosis not present

## 2015-12-03 LAB — BASIC METABOLIC PANEL
ANION GAP: 11 (ref 5–15)
BUN: 16 mg/dL (ref 6–20)
CO2: 22 mmol/L (ref 22–32)
Calcium: 8.7 mg/dL — ABNORMAL LOW (ref 8.9–10.3)
Chloride: 108 mmol/L (ref 101–111)
Creatinine, Ser: 1.22 mg/dL (ref 0.61–1.24)
GFR, EST NON AFRICAN AMERICAN: 57 mL/min — AB (ref >60)
Glucose, Bld: 110 mg/dL — ABNORMAL HIGH (ref 65–99)
POTASSIUM: 3.3 mmol/L — AB (ref 3.5–5.1)
SODIUM: 141 mmol/L (ref 135–145)

## 2015-12-03 LAB — CBC
HEMATOCRIT: 36.9 % — AB (ref 39.0–52.0)
Hemoglobin: 12.2 g/dL — ABNORMAL LOW (ref 13.0–17.0)
MCH: 29.8 pg (ref 26.0–34.0)
MCHC: 33.1 g/dL (ref 30.0–36.0)
MCV: 90 fL (ref 78.0–100.0)
Platelets: 187 10*3/uL (ref 150–400)
RBC: 4.1 MIL/uL — AB (ref 4.22–5.81)
RDW: 13.4 % (ref 11.5–15.5)
WBC: 9.3 10*3/uL (ref 4.0–10.5)

## 2015-12-03 LAB — TROPONIN I: Troponin I: 0.03 ng/mL (ref <0.031)

## 2015-12-03 LAB — GLUCOSE, CAPILLARY: Glucose-Capillary: 111 mg/dL — ABNORMAL HIGH (ref 65–99)

## 2015-12-03 MED ORDER — NITROGLYCERIN 0.4 MG SL SUBL: 0.4000 mg | SUBLINGUAL_TABLET | SUBLINGUAL | Status: AC | PRN

## 2015-12-03 MED ORDER — CLOPIDOGREL BISULFATE 75 MG PO TABS: 75.0000 mg | ORAL_TABLET | Freq: Every day | ORAL | Status: DC

## 2015-12-03 MED ORDER — POTASSIUM CHLORIDE CRYS ER 20 MEQ PO TBCR
40.0000 meq | EXTENDED_RELEASE_TABLET | Freq: Once | ORAL | Status: AC
  Administered 2015-12-03: 40 meq via ORAL
  Filled 2015-12-03: qty 2

## 2015-12-03 NOTE — Progress Notes (Addendum)
CARDIAC REHAB PHASE I   PRE:  Rate/Rhythm: 26 SR c rare PVC's  BP:   Sitting: 120/68      SaO2: 97% RA  MODE:  Ambulation: 522 ft   POST:  Rate/Rhythm: 83 SR c rare PVC's  BP:   Sitting: 155/73   10 recheck 159/70     SaO2: 98%RA [phone] Pt ambulated 552 feet within min A/ gaitbelt. Pt maintained a somewhat steady gait. Returned pt to recliner with LE elevated and VSS. Did education/review for pt. Discussed risk factors, activity and lifting restrictions,antiplatelets (plaxix/ASA), HH diet and diabetic diet, exercise guidelines, NTG use and emergency precautions.Stent card was given. Referred pt to cardiac rehab at [facility]. Pt has done the program a few times, may not do the program due to lack of time. The patient is participating in the [place] stent study with insertion of a DES polymer free stent.   Hortence Charter D Maahir Horst,MS,ACSM-RCEP 12/03/2015 10:20 AM

## 2015-12-03 NOTE — Discharge Summary (Signed)
Discharge Summary    Patient ID: Roberto Haley,  MRN: [id number], DOB/AGE: October 05, 1940 75 y.o.  Admit date: 12/02/2015 Discharge date: 12/03/2015  Primary Care Provider: [id number] Primary Cardiologist: Dr Claiborne Billings  Discharge Diagnoses    Principal Problem:   CAD in native artery   Allergies Allergies  Allergen Reactions  . Tramadol Nausea And Vomiting  . Codeine Rash    Diagnostic Studies/Procedures    CATH: 04/28  Ost 2nd Mrg to 2nd Mrg lesion, 50% stenosed.  Prox LAD lesion, 50% stenosed.  Ost Cx lesion, 10% stenosed.  Prox RCA lesion, 100% stenosed.  SVG was injected .  Dist RCA-1 lesion, 95% stenosed. Post intervention, there is a 0% residual stenosis.  Dist RCA-2 lesion, 75% stenosed. Post intervention, there is a 0% residual stenosis. Significant native coronary obstructive disease with a patent stent extending from the mid left main into the proximal LAD, 50% segmental narrowings in the LAD after the first diagonal vessel and widely patent mid LAD stent; patent PTCA sites in the left circumflex proximally and at the midsegment with 50% stenosis of a small OM branch and new total occlusion of the proximal native RCA with faint bridging collaterals and also collateralization to a distal branch of the RCA which had not been supplied by the vein graft. Previously documented occluded LIMA graft which was not reinjected today. Patent SVG supplying the PDA and PLA vessel. There are new 95 and 75% stenoses in the mid and distal PDA branch after the SVG anastomosis. Successful PCI with PTCA/DES polymer free [employer] stent insertion in the mid PDA site with the 95% stenosis being reduced to 0% and PTCA of the very distal 75% stenosis to 0%. RECOMMENDATION: The patient is participating in the [place] stent study with insertion of a DES polymer free stent.    _____________   History of Present Illness     75 yo male w/ hx CABG  1997, subsequent stenting LAD x 2 w/ PTCA D1, PTCA CFX, brain injury 2014, bradycardia on BB. Recent eval w/ abnl MV>>admitted for cath 04/28.  Hospital Course     Consultants: None   Roberto Haley had a cath, results above. He had a DES inserted into the mPDA with PTCA of the dPDA and tolerated the procedure well. He is participating in the [place].  On 04/29, he was seen by Dr Radford Pax and all data were reviewed. He was ambulating without chest pain or SOB. His cath site was without ecchymosis or hematoma but he did have some tape damage around it.   His BP was running high in the hospital, but the patient says it is normal at home. He will track it more carefully and bring results to the office.   No further inpatient workup was indicated and he is considered stable for discharge, to follow up as an outpatient. _____________  Discharge Vitals Blood pressure 174/77, pulse 72, temperature 97.2 F (36.2 C), temperature source Oral, resp. rate 13, height 5\' 8"  (1.727 m), weight 206 lb 12.7 oz (93.8 kg), SpO2 94 %.  Filed Weights   12/02/15 0816 12/03/15 0417  Weight: 195 lb (88.451 kg) 206 lb 12.7 oz (93.8 kg)  General: Well developed, well nourished, male in no acute distress Head: Eyes PERRLA, No xanthomas.   Normocephalic and atraumatic  Lungs: Clear bilaterally to auscultation. Heart: HRRR S1 S2, without MRG.  Pulses are 2+ & equal. No carotid bruit. No JVD. Abdomen: Bowel sounds are present,  abdomen soft and non-tender without masses or  hernias noted. Msk: Normal strength and tone for age. Extremities: No clubbing, cyanosis or edema.  Cath site without ecchymosis or hematoma, minor abrasions around it.  Skin:  No rashes or lesions noted. Neuro: Alert and oriented X 3. Psych:  Good affect, responds appropriately   Labs & Radiologic Studies    CBC  Recent Labs  12/03/15 0517  WBC 9.3  HGB 12.2*  HCT 36.9*  MCV 90.0  PLT 123XX123   Basic Metabolic Panel  Recent Labs   12/03/15 0517  NA 141  K 3.3*  CL 108  CO2 22  GLUCOSE 110*  BUN 16  CREATININE 1.22  CALCIUM 8.7*   Cardiac Enzymes  Recent Labs  12/02/15 1140 12/03/15 0517  TROPONINI <0.03 0.03   _____________  Dg Chest 2 View  11/25/2015  CLINICAL DATA:  Essential hypertension. Preprocedure for cardiac catheterization EXAM: CHEST  2 VIEW COMPARISON:  02/06/2013 FINDINGS: Median sternotomy and CABG. Heart size upper normal. Negative for heart failure Lungs are clear without infiltrate or effusion. No mass or adenopathy. Mild hyperinflation lungs suggesting COPD. IMPRESSION: No active cardiopulmonary disease. Electronically Signed   By: Franchot Gallo M.D.   On: 11/25/2015 16:57   Disposition   Pt is being discharged home today in good condition.  Follow-up Plans & Appointments    Follow-up Information    Follow up with Troy Sine, MD.   Specialty:  Cardiology   Why:  The office will call.   Contact information:   [address] [place] [address] [phone]        Discharge Medications   Current Discharge Medication List    START taking these medications   Details  clopidogrel (PLAVIX) 75 MG tablet Take 1 tablet (75 mg total) by mouth daily with breakfast. Qty: 30 tablet, Refills: 11      CONTINUE these medications which have CHANGED   Details  nitroGLYCERIN (NITROSTAT) 0.4 MG SL tablet Place 1 tablet (0.4 mg total) under the tongue every 5 (five) minutes as needed. For chest pain Qty: 25 tablet, Refills: 12      CONTINUE these medications which have NOT CHANGED   Details  amLODipine (NORVASC) 5 MG tablet Take 1.5 tablets (7.5 mg total) by mouth daily. Qty: 45 tablet, Refills: 11    aspirin 81 MG tablet Take 81 mg by mouth daily.    atorvastatin (LIPITOR) 40 MG tablet Take 40 mg by mouth daily.  Refills: 0    benazepril (LOTENSIN) 40 MG tablet Take 40 mg by mouth daily.    cholecalciferol (VITAMIN D) 1000 UNITS tablet Take 1,000 Units by  mouth daily.    insulin aspart (NOVOLOG) 100 UNIT/ML FlexPen Inject 12 Units into the skin 3 (three) times daily with meals.     isosorbide mononitrate (IMDUR) 60 MG 24 hr tablet Take 1 tablet in the morning and 1/2 tablet at night. Qty: 45 tablet, Refills: 11    LEVEMIR FLEXTOUCH 100 UNIT/ML Pen Inject 35 Units into the skin 2 (two) times daily.  Refills: 12    LINZESS 145 MCG CAPS capsule TK 1 C PO D Refills: 11    metFORMIN (GLUCOPHAGE-XR) 750 MG 24 hr tablet Take 750 mg by mouth daily with breakfast.    metoprolol succinate (TOPROL-XL) 100 MG 24 hr tablet Take 50 mg by mouth daily.    Multiple Vitamin (MULTIVITAMIN WITH MINERALS) TABS Take 1 tablet by mouth daily.    oxybutynin (DITROPAN)  5 MG tablet Take 1 tablet by mouth daily.    ranitidine (ZANTAC) 150 MG tablet Take 150 mg by mouth 2 (two) times daily.    SYNTHROID 75 MCG tablet Take 1 tablet by mouth daily.     TRULICITY 1.5 0000000 SOPN Inject 1.5 mg as directed once a week.  Refills: 12    Vitamin D, Cholecalciferol, 1000 UNITS CAPS Refills: 12    Dulaglutide (TRULICITY) A999333 0000000 SOPN Inject into the skin. Once weekly on thursday    Insulin Glargine (LANTUS SOLOSTAR) 100 UNIT/ML Solostar Pen Inject 55 Units into the skin daily at 10 pm.     NOVOFINE AUTOCOVER 30G X 8 MM MISC     ONE TOUCH ULTRA TEST test strip Refills: 4    polyethylene glycol powder (GLYCOLAX/MIRALAX) powder     PREVNAR 13 SUSP injection Refills: 0    vitamin C (ASCORBIC ACID) 500 MG tablet Take 500 mg by mouth daily.         Outstanding Labs/Studies   None  Duration of Discharge Encounter   Greater than 30 minutes including physician time.  Jonetta Speak NP 12/03/2015, 8:36 AM

## 2015-12-03 NOTE — Discharge Instructions (Signed)

## 2015-12-05 ENCOUNTER — Telehealth: Payer: Self-pay | Admitting: Cardiovascular Disease

## 2015-12-05 ENCOUNTER — Encounter (HOSPITAL_COMMUNITY): Payer: Self-pay | Admitting: Cardiovascular Disease

## 2015-12-05 NOTE — Telephone Encounter (Signed)
TCM phone call .Marland Kitchen Appt is 12/15/15 at 1:30pm w/ Rosaria Ferries at the [facility] .Marland Kitchen  Thanks

## 2015-12-06 NOTE — Telephone Encounter (Signed)
Pt returning call to [employer] call [phone]

## 2015-12-06 NOTE — Telephone Encounter (Signed)
Patient contacted regarding discharge from [facility] on 12/03/15.  Patient understands to follow up with provider rhonda barrett on 12/15/15 at 1:30 pm  at [facility]. Patient understands discharge instructions? yes  Patient understands medications and regiment? yes  Patient understands to bring all medications to this visit? yes   PATIENT PRAISED [place]

## 2015-12-06 NOTE — Telephone Encounter (Signed)
LEFT MESSAGE TO CALL BACK - IN REGARDS TO TOC CALL 

## 2015-12-15 ENCOUNTER — Encounter: Payer: Self-pay | Admitting: Physician Assistant

## 2015-12-15 ENCOUNTER — Ambulatory Visit (INDEPENDENT_AMBULATORY_CARE_PROVIDER_SITE_OTHER): Payer: Medicare Other | Admitting: Physician Assistant

## 2015-12-15 VITALS — BP 140/80 | HR 61 | Ht 68.0 in | Wt 204.0 lb

## 2015-12-15 DIAGNOSIS — I25700 Atherosclerosis of coronary artery bypass graft(s), unspecified, with unstable angina pectoris: Secondary | ICD-10-CM | POA: Diagnosis not present

## 2015-12-15 DIAGNOSIS — I2510 Atherosclerotic heart disease of native coronary artery without angina pectoris: Secondary | ICD-10-CM

## 2015-12-15 DIAGNOSIS — E785 Hyperlipidemia, unspecified: Secondary | ICD-10-CM

## 2015-12-15 DIAGNOSIS — I10 Essential (primary) hypertension: Secondary | ICD-10-CM

## 2015-12-15 NOTE — Patient Instructions (Addendum)
Medication Instructions:  Continue current medication  Labwork: NONE  Testing/Procedures: NONE  Follow-Up: Your physician wants you to follow-up: First available appointment with Dr. Dow Adolph have been referred to Cardiac Rehab in [place]   Any Other Special Instructions Will Be Listed Below (If Applicable).   If you need a refill on your cardiac medications before your next appointment, please call your pharmacy.

## 2015-12-15 NOTE — Progress Notes (Signed)
Cardiology Office Note   Date:  12/15/2015   ID:  Roberto Haley, DOB September 12, 1940, MRN [id number]  PCP:  Nicoletta Dress, MD  Cardiologist:  Dr Meredeth Ide, PA-C   Chief Complaint  Patient presents with  . Follow-up    DOE, elevated BP's,     History of Present Illness: Roberto Haley is a 75 y.o. 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   CP eval>>MV abnl>>cath w/ new occlusion pRCA, SVG-RCA OK, 75% PDA s/p [employer] study stent>>0%  Roberto Haley presents for Post hospital follow-up  Since discharge from the hospital, he has not had any chest pain. He has had some right groin pain and bruising, but this is getting better. He is gradually increasing his activity. He has had some dyspnea on exertion, but no lower extremity edema, orthopnea or PND. He thinks he is recovering well and would like to gain strength. He is interested in cardiac rehabilitation, but wishes to do this in [place].  He is tolerating his medications well and not having any problems with dual antiplatelet therapy. He is participating in this study and will keep his follow-up appointments with research.   Past Medical History  Diagnosis Date  . Coronary artery disease   . Diabetes mellitus   . Groin hematoma 02/19/12    RIGHT  . Hyperlipidemia   . Hypertension   . Obesity   . PVC (premature ventricular contraction)   . Chronic kidney disease     CKD STAGE 3;  CHRONIC RENAL INSUFFICIENCY  . Neuropathy due to type 2 diabetes mellitus ([place])   . Arthritis   . SDH (subdural hematoma) ([place]) 02/06/2013    R parafalcine SDH after a fall, rx at [facility]    Past Surgical History  Procedure Laterality Date  . Coronary artery bypass graft  1997    LIMA to the LAD and vein to the RCA;  . Cardiac catheterization  01/14/12    LV FXN EF 50-55% W/MILD DISTAL INFERIOR HYPOKINESIS;  PCI: LAD PTCA/STENT W/NEW 2.75X22 RESOLUTE DES IN MID LAD POST DIALTED TO 3.08 TO 2.97 TAPER: 99%-80% TO 0; LCX: PTCA VIA LM STENT W/2.25X12 SPRINTER BALLOON: 90%-5; LAD: PATENT PROXIMAL STENT EXTENDING INTO LM W/30-40% SMOOTH NARROWING IN DISTAL PORTION OF STENT; 99% IN STENT RESTENOSIS OF MID LAD STENT   . Lower venous duplex  02/05/12    ESSENTIALLY NORMAL RIGHT LOWER EXTRIMTY VENOUS DUPLEX DOPPLER EVALUATION  . Lower arterial doppler  01/24/12    ESSENTIALLY NORMAL RIGHT GROIN DUPLEX EVALUATION S/P THROMBIN INJECTION  . Nm myoview ltd  01/09/12    HIGH RISK SCAN. COMPARED TO PREVIOUS STUDY, THERE IS NOW ISCHEMIA PRESENT. ABNORMAL MYOCARDIAL PERFUSION STUDY. THERE IS NEW MILD INFEROLATERAL ISCHEMIA TOWARDS THE BASE; PT TO FOLLOW UP WITH DR. TK  . Doppler echocardiography  01/09/12    LV NORMAL IN SIZE, NORMAL WAL THICKNESS, EF 50-55%; MILD POSTERIOR WALL HYOPKINESIS, THERE IS MILD INFERIOR WALL HYPOKINESIS  . Left heart catheterization with coronary/graft angiogram N/A 01/14/2012    Procedure: LEFT HEART CATHETERIZATION WITH Beatrix Fetters;  Surgeon: Troy Sine, MD;  Location: [facility] C[facility] LAB;  Service: Cardiovascular;  Laterality: N/A;  . Percutaneous coronary stent intervention (pci-s) N/A 01/14/2012    Procedure: PERCUTANEOUS CORONARY STENT INTERVENTION (PCI-S);  Surgeon: Troy Sine, MD;  Location: [facility] CATH LAB;  Service: Cardiovascular;  Laterality: N/A;  . Hypothenar fat pad transfer    . Coronary stent placement  12/02/2015    PAD  . Cardiac catheterization N/A 12/02/2015    Procedure: Left Heart Cath and Coronary Angiography;  Surgeon: Troy Sine, MD;  Location: [place] CV LAB;  Service: Cardiovascular;  Laterality: N/A;  . Cardiac catheterization N/A 12/02/2015    Procedure: Coronary Stent Intervention;  Surgeon: Troy Sine, MD;  Location: [place] CV LAB;  Service: Cardiovascular;  Laterality: N/A;    Current Outpatient Prescriptions  Medication Sig Dispense Refill   . amLODipine (NORVASC) 5 MG tablet Take 1.5 tablets (7.5 mg total) by mouth daily. (Patient taking differently: Take 5 mg by mouth daily. ) 45 tablet 11  . aspirin 81 MG tablet Take 81 mg by mouth daily.    Marland Kitchen atorvastatin (LIPITOR) 40 MG tablet Take 40 mg by mouth daily.   0  . benazepril (LOTENSIN) 40 MG tablet Take 40 mg by mouth daily.    . cholecalciferol (VITAMIN D) 1000 UNITS tablet Take 1,000 Units by mouth daily.    . clopidogrel (PLAVIX) 75 MG tablet Take 1 tablet (75 mg total) by mouth daily with breakfast. 30 tablet 11  . Dulaglutide (TRULICITY) A999333 0000000 SOPN Inject into the skin. Once weekly on thursday    . insulin aspart (NOVOLOG) 100 UNIT/ML FlexPen Inject 12 Units into the skin 3 (three) times daily with meals.     . isosorbide mononitrate (IMDUR) 60 MG 24 hr tablet Take 1 tablet in the morning and 1/2 tablet at night. (Patient taking differently: Take 30-60 mg by mouth daily. Take 1 tablet in the morning and 1/2 tablet at night.) 45 tablet 11  . LEVEMIR FLEXTOUCH 100 UNIT/ML Pen Inject 35 Units into the skin 2 (two) times daily.   12  . LINZESS 145 MCG CAPS capsule TK 1 C PO D  11  . metFORMIN (GLUCOPHAGE-XR) 750 MG 24 hr tablet Take 750 mg by mouth daily with breakfast.    . metoprolol succinate (TOPROL-XL) 100 MG 24 hr tablet Take 50 mg by mouth daily.    . Multiple Vitamin (MULTIVITAMIN WITH MINERALS) TABS Take 1 tablet by mouth daily.    . nitroGLYCERIN (NITROSTAT) 0.4 MG SL tablet Place 1 tablet (0.4 mg total) under the tongue every 5 (five) minutes as needed. For chest pain 25 tablet 12  . NOVOFINE AUTOCOVER 30G X 8 MM MISC     . ONE TOUCH ULTRA TEST test strip   4  . oxybutynin (DITROPAN) 5 MG tablet Take 1 tablet by mouth daily.    . polyethylene glycol powder (GLYCOLAX/MIRALAX) powder     . PREVNAR 13 SUSP injection   0  . ranitidine (ZANTAC) 150 MG tablet Take 150 mg by mouth 2 (two) times daily.    Marland Kitchen SYNTHROID 75 MCG tablet Take 1 tablet by mouth daily.     .  TRULICITY 1.5 0000000 SOPN Inject 1.5 mg as directed once a week.   12  . vitamin C (ASCORBIC ACID) 500 MG tablet Take 500 mg by mouth daily.    . Vitamin D, Cholecalciferol, 1000 UNITS CAPS   12   No current facility-administered medications for this visit.    Allergies:   Tramadol and Codeine    Social History:  The patient  reports that he has never smoked. He has never used smokeless tobacco. He reports that he does not drink alcohol or use illicit drugs.   Family History:  The patient's family history includes Cancer in his father; Diabetes in his paternal aunt. There is no history of Heart disease.    ROS:  Please see the history of present illness. All other systems are reviewed and negative.    PHYSICAL EXAM: VS:  BP 140/80 mmHg  Pulse 61  Ht 5\' 8"  (1.727 m)  Wt 204 lb (92.534 kg)  BMI 31.03 kg/m2 , BMI Body mass index is 31.03 kg/(m^2). GEN: Well nourished, well developed, male in no acute distress HEENT: normal for age  Neck: no JVD, no carotid bruit, no masses Cardiac: RRR; soft murmur, no rubs, or gallops Respiratory: Decreased breath sounds bases but clear bilaterally, normal work of breathing GI: soft, nontender, nondistended, + BS MS: no deformity or atrophy; no edema; distal pulses are 2+ in all 4 extremities; right groin with ecchymosis that has almost completely resolved, no hematoma and no bruit  Skin: warm and dry, no rash Neuro:  Strength and sensation are intact Psych: euthymic mood, full affect   EKG:  EKG is ordered today. The ekg ordered today demonstrates sinus rhythm, no acute ischemic changes, no significant change from previous ECG   Recent Labs: 11/25/2015: ALT 16 12/03/2015: BUN 16; Creatinine, Ser 1.22; Hemoglobin 12.2*; Platelets 187; Potassium 3.3*; Sodium 141    Lipid Panel    Component Value Date/Time   CHOL 138 11/01/2014   HDL 39 11/01/2014     Wt Readings from Last 3 Encounters:  12/15/15 204 lb (92.534 kg)  12/03/15 206 lb  12.7 oz (93.8 kg)  11/25/15 203 lb (92.08 kg)     Other studies Reviewed: Additional studies/ records that were reviewed today include: Office notes, hospital records and testing.  ASSESSMENT AND PLAN:  1.  Unstable anginal pain: He had a study stent placed recently and is not having any ischemic symptoms after that. Medical therapy further distal disease is recommended. He is on good medical therapy with aspirin, Plavix, Imdur, Lipitor, an ACE inhibitor, and metoprolol. Continue current therapy. Contact us for symptoms.  He is appropriate for cardiac rehabilitation. He wishes to do it in [place], we will make the referral.   He is in the [employer] study, he is to go to single antiplatelet therapy after 30 days. I will clarify with the research team if he is to discontinue the aspirin or the Plavix.  2. Hypertension: His blood pressure is mildly elevated here in the office today, but he states it does not run that high at home. Continue current therapy.  3. Hyperlipidemia: His lipid status is followed by his primary care physician in [place]. The last records we have are from March 2016. He is encouraged to get the most recent profile and send to [place].   Current medicines are reviewed at length with the patient today.  The patient does not have concerns regarding medicines.  The following changes have been made:  no change  Labs/ tests ordered today include:   Orders Placed This Encounter  Procedures  . EKG 12-Lead     Disposition:   FU with Dr. Claiborne Billings and the research team  Signed, Lenoard Aden  12/15/2015 3:05 PM    [place] Phone: [phone]; Fax: [phone]  This note was written with the assistance of speech recognition software. Please excuse any transcriptional errors.

## 2015-12-20 DIAGNOSIS — I10 Essential (primary) hypertension: Secondary | ICD-10-CM | POA: Diagnosis not present

## 2015-12-20 DIAGNOSIS — Z951 Presence of aortocoronary bypass graft: Secondary | ICD-10-CM | POA: Diagnosis not present

## 2015-12-20 DIAGNOSIS — Z79899 Other long term (current) drug therapy: Secondary | ICD-10-CM | POA: Diagnosis not present

## 2015-12-20 DIAGNOSIS — E785 Hyperlipidemia, unspecified: Secondary | ICD-10-CM | POA: Diagnosis not present

## 2015-12-20 DIAGNOSIS — Z9861 Coronary angioplasty status: Secondary | ICD-10-CM | POA: Diagnosis not present

## 2015-12-20 DIAGNOSIS — E119 Type 2 diabetes mellitus without complications: Secondary | ICD-10-CM | POA: Diagnosis not present

## 2015-12-20 DIAGNOSIS — Z794 Long term (current) use of insulin: Secondary | ICD-10-CM | POA: Diagnosis not present

## 2015-12-21 ENCOUNTER — Ambulatory Visit (INDEPENDENT_AMBULATORY_CARE_PROVIDER_SITE_OTHER): Payer: Medicare Other | Admitting: Cardiovascular Disease

## 2015-12-21 VITALS — BP 153/83 | HR 60 | Ht 67.0 in | Wt 202.8 lb

## 2015-12-21 DIAGNOSIS — I2510 Atherosclerotic heart disease of native coronary artery without angina pectoris: Secondary | ICD-10-CM

## 2015-12-21 DIAGNOSIS — I25810 Atherosclerosis of coronary artery bypass graft(s) without angina pectoris: Secondary | ICD-10-CM

## 2015-12-21 DIAGNOSIS — E785 Hyperlipidemia, unspecified: Secondary | ICD-10-CM

## 2015-12-21 DIAGNOSIS — I10 Essential (primary) hypertension: Secondary | ICD-10-CM

## 2015-12-21 MED ORDER — AMLODIPINE BESYLATE 10 MG PO TABS: 10.0000 mg | ORAL_TABLET | Freq: Every day | ORAL | Status: DC

## 2015-12-21 NOTE — Patient Instructions (Signed)
Your physician has recommended you make the following change in your medication:   1.) the amlodipine has been changed to 10mg . And has been sent to your pharmacy.  Your physician recommends that you schedule a follow-up appointment in: 3 months with Dr Claiborne Billings

## 2015-12-22 ENCOUNTER — Encounter: Payer: Self-pay | Admitting: Cardiovascular Disease

## 2015-12-22 NOTE — Progress Notes (Signed)
Patient ID: Roberto Haley, male   DOB: 09/03/1940, 75 y.o.   MRN: [id number]     Primary M.D.: Dr. Nelda Bucks  HPI: Roberto Haley is a 75 y.o. male who presents to the office for a 7 month cardiology followup evaluation.   Roberto Haley has known CAD and underwent CABG revascularization surgery in 1997 with LIMA to LAD, vein to the RCA. In March 1998 he underwent complex intervention to his native LAD and high speed rotational atherectomy (HSRA) and stenting of his LAD and PTCA of his diagonal vessel after he was found to have stenosis in the LIMA graft. In June 2013 due to 99% in-stent restenosis in the LAD stent and a new stenosis of 90% the distal circumflex, he underwent two-vessel intervention with insertion of a 2.75x22 mm resident stent in the LAD and PTCA of the distal circumflex via his previously placed stent which started in the mid left main coronary artery. A cardiopulmonary met test showed reduced maximum oxygen consumption. I  reduced his beta blocker therapy due to significant chronotropic incompetence and further titrated his Ranexa to 1000 mg twice a day.  A nuclear perfusion study in June 2014 showed distal anteroseptal scar without significant ischemia. He did have PACs and PVCs in gating was not done.  He  fell on the Fourth of July 2014 and landed on his head. He ultimately was transferred to [facility] where was hospitalized for a week. He did not have neurosurgery. He was told of having "blood on his brain."  He did have a left-sided per hemiparesis. He then spent 2 months at [facility] rehabilitation unit and has spent additional time a [facility] in [place] until October 31.  He is significant improved following his head trauma with return of his memory.  Last year, I reduced his beta blocker therapy due to bradycardia.  He underwent a nuclear perfusion study on 11/22/2015 which now showed an ejection fraction of 42% with inferolateral hypokinesis.   There was a new large defect in the inferolateral wall and there was no change in his previous anteroseptal defect.  There was concern for scar/ischemia inferolaterally which was new.   He underwent cardiac catheterization by me on 12/02/2015.  He was found to have significant native CAD with a patent stent extending from the mid left main into the proximal LAD, 50% narrowings in the LAD after the first diagonal vessel and widely patent mid LAD stent.  There was pain PTCA sites in the left circumflex proximally at the mid segment with 50% stenosis a small OM branch.  There was new total occlusion of his proximal native RCA with faint bridging collaterals and faint collateralization to the distal branch of the RCA which had not been supplied by the vein graft.  He had previously documented occluded LIMA graft.  The vein graft supplying the PDA and PLA vessel was patent but he had new distal disease with 95% stenosis in the mid PDA and very distal 75% stenosis.  He underwent successful intervention to his PDA and due to his prior history of head trauma and CNS bleed.  He underwent insertion of a research bile freedoms stent where he will at 30 days be transitioned to only single anti platelet therapy rather than DAPT.  Since undergoing his PCI to his mid PDA.  He has felt well.  He denies recurrent chest pain.  He has started cardiac rehabilitation.  He presents for evaluation.   Past Medical History  Diagnosis Date  . Coronary artery disease   . Diabetes mellitus   . Groin hematoma 02/19/12    RIGHT  . Hyperlipidemia   . Hypertension   . Obesity   . PVC (premature ventricular contraction)   . Chronic kidney disease     CKD STAGE 3;  CHRONIC RENAL INSUFFICIENCY  . Neuropathy due to type 2 diabetes mellitus ([place])   . Arthritis   . SDH (subdural hematoma) ([place]) 02/06/2013    R parafalcine SDH after a fall, rx at [facility]    Past Surgical History  Procedure Laterality Date  . Coronary artery bypass  graft  1997    LIMA to the LAD and vein to the RCA;  . Cardiac catheterization  01/14/12    LV FXN EF 50-55% W/MILD DISTAL INFERIOR HYPOKINESIS; PCI: LAD PTCA/STENT W/NEW 2.75X22 RESOLUTE DES IN MID LAD POST DIALTED TO 3.08 TO 2.97 TAPER: 99%-80% TO 0; LCX: PTCA VIA LM STENT W/2.25X12 SPRINTER BALLOON: 90%-5; LAD: PATENT PROXIMAL STENT EXTENDING INTO LM W/30-40% SMOOTH NARROWING IN DISTAL PORTION OF STENT; 99% IN STENT RESTENOSIS OF MID LAD STENT   . Lower venous duplex  02/05/12    ESSENTIALLY NORMAL RIGHT LOWER EXTRIMTY VENOUS DUPLEX DOPPLER EVALUATION  . Lower arterial doppler  01/24/12    ESSENTIALLY NORMAL RIGHT GROIN DUPLEX EVALUATION S/P THROMBIN INJECTION  . Nm myoview ltd  01/09/12    HIGH RISK SCAN. COMPARED TO PREVIOUS STUDY, THERE IS NOW ISCHEMIA PRESENT. ABNORMAL MYOCARDIAL PERFUSION STUDY. THERE IS NEW MILD INFEROLATERAL ISCHEMIA TOWARDS THE BASE; PT TO FOLLOW UP WITH DR. TK  . Doppler echocardiography  01/09/12    LV NORMAL IN SIZE, NORMAL WAL THICKNESS, EF 50-55%; MILD POSTERIOR WALL HYOPKINESIS, THERE IS MILD INFERIOR WALL HYPOKINESIS  . Left heart catheterization with coronary/graft angiogram N/A 01/14/2012    Procedure: LEFT HEART CATHETERIZATION WITH Beatrix Fetters;  Surgeon: Troy Sine, MD;  Location: [facility] CATH LAB;  Service: Cardiovascular;  Laterality: N/A;  . Percutaneous coronary stent intervention (pci-s) N/A 01/14/2012    Procedure: PERCUTANEOUS CORONARY STENT INTERVENTION (PCI-S);  Surgeon: Troy Sine, MD;  Location: [facility] CATH LAB;  Service: Cardiovascular;  Laterality: N/A;  . Hypothenar fat pad transfer    . Coronary stent placement  12/02/2015    PAD  . Cardiac catheterization N/A 12/02/2015    Procedure: Left Heart Cath and Coronary Angiography;  Surgeon: Troy Sine, MD;  Location: [place] CV LAB;  Service: Cardiovascular;  Laterality: N/A;  . Cardiac catheterization N/A 12/02/2015    Procedure: Coronary Stent Intervention;  Surgeon: Troy Sine, MD;   Location: [place] CV LAB;  Service: Cardiovascular;  Laterality: N/A;    Allergies  Allergen Reactions  . Tramadol Nausea And Vomiting  . Codeine Rash    Current Outpatient Prescriptions  Medication Sig Dispense Refill  . aspirin 81 MG tablet Take 81 mg by mouth daily.    Marland Kitchen atorvastatin (LIPITOR) 40 MG tablet Take 40 mg by mouth daily.   0  . benazepril (LOTENSIN) 40 MG tablet Take 40 mg by mouth daily.    . cholecalciferol (VITAMIN D) 1000 UNITS tablet Take 1,000 Units by mouth daily.    . clopidogrel (PLAVIX) 75 MG tablet Take 1 tablet (75 mg total) by mouth daily with breakfast. 30 tablet 11  . Dulaglutide (TRULICITY) 3.29 VB/1.6OM SOPN Inject into the skin. Once weekly on thursday    . insulin aspart (NOVOLOG) 100 UNIT/ML FlexPen Inject 12 Units into the skin 3 (three)  times daily with meals.     . isosorbide mononitrate (IMDUR) 60 MG 24 hr tablet Take 1 tablet in the morning and 1/2 tablet at night. (Patient taking differently: Take 30-60 mg by mouth daily. Take 1 tablet in the morning and 1/2 tablet at night.) 45 tablet 11  . LEVEMIR FLEXTOUCH 100 UNIT/ML Pen Inject 35 Units into the skin 2 (two) times daily.   12  . LINZESS 145 MCG CAPS capsule TK 1 C PO D  11  . metFORMIN (GLUCOPHAGE-XR) 750 MG 24 hr tablet Take 750 mg by mouth daily with breakfast.    . metoprolol succinate (TOPROL-XL) 100 MG 24 hr tablet Take 50 mg by mouth daily.    . Multiple Vitamin (MULTIVITAMIN WITH MINERALS) TABS Take 1 tablet by mouth daily.    . nitroGLYCERIN (NITROSTAT) 0.4 MG SL tablet Place 1 tablet (0.4 mg total) under the tongue every 5 (five) minutes as needed. For chest pain 25 tablet 12  . NOVOFINE AUTOCOVER 30G X 8 MM MISC     . ONE TOUCH ULTRA TEST test strip   4  . oxybutynin (DITROPAN) 5 MG tablet Take 1 tablet by mouth daily.    . polyethylene glycol powder (GLYCOLAX/MIRALAX) powder     . PREVNAR 13 SUSP injection   0  . ranitidine (ZANTAC) 150 MG tablet Take 150 mg by mouth 2 (two)  times daily.    Marland Kitchen SYNTHROID 75 MCG tablet Take 1 tablet by mouth daily.     . TRULICITY 1.5 OZ/3.6UY SOPN Inject 1.5 mg as directed once a week.   12  . vitamin C (ASCORBIC ACID) 500 MG tablet Take 500 mg by mouth daily.    . Vitamin D, Cholecalciferol, 1000 UNITS CAPS   12  . amLODipine (NORVASC) 10 MG tablet Take 1 tablet (10 mg total) by mouth daily. 30 tablet 11   No current facility-administered medications for this visit.    Socially, he is married has one child 2 grandchildren and 2 great-grandchildren. There is no tobacco or alcohol use.  ROS General: Negative; No fevers, chills, or night sweats;  HEENT: Positive for cataracts; No changes in hearing, sinus congestion, difficulty swallowing Pulmonary: Negative; No cough, wheezing, shortness of breath, hemoptysis Cardiovascular: See history of present illness;  GI: Positive for GERD on ranitidine; No nausea, vomiting, diarrhea, or abdominal pain GU: Negative; No dysuria, hematuria, or difficulty voiding Musculoskeletal: Negative; no myalgias, joint pain, or weakness Hematologic/Oncology: Negative; no easy bruising, bleeding Endocrine: Positive for hypothyroidism, on Synthroid replacement; diabetes not well controlled Neuro: Negative; no changes in balance, headaches Skin: Negative; No rashes or skin lesions Psychiatric: Negative; No behavioral problems, depression Sleep: Negative; No snoring, daytime sleepiness, hypersomnolence, bruxism, restless legs, hypnogognic hallucinations, no cataplexy Other comprehensive 14 point system review is negative.  PE BP 153/83 mmHg  Pulse 60  Ht '5\' 7"'  (1.702 m)  Wt 202 lb 12.8 oz (91.989 kg)  BMI 31.76 kg/m2  Repeat blood pressure by me was 174/88.  Wt Readings from Last 3 Encounters:  12/21/15 202 lb 12.8 oz (91.989 kg)  12/15/15 204 lb (92.534 kg)  12/03/15 206 lb 12.7 oz (93.8 kg)   General: Alert, oriented, no distress.  HEENT: Normocephalic, atraumatic. Pupils round and reactive;  sclera anicteric; no lid lag. Atrophic muscles intact. No xanthelasmas. Nose without nasal septal hypertrophy Mouth/Parynx benign; Mallinpatti scale 3 Neck: No JVD, no carotid bruits with normal carotid upstroke Chest wall: Nontender to palpation Lungs: clear to ausculatation and percussion;  no wheezing or rales Heart: RRR, s1 s2 normal 2/6 sem, unchanged, no S3, gallop; no diastolic murmur.  No rubs, thrills or heaves Abdomen: soft, nontender; no hepatosplenomehaly, BS+; abdominal aorta nontender and not dilated by palpation. Mild central adiposity Pulses 2+; right groin catheterization site well-healed. Extremities: Trivial residual edema; no clubbing cyanosis, Homan's sign negative  Neurologic: grossly nonfocal Psychologic: Normal affect and mood  ECG (independently read by me): Sinus bradycardia 59 bpm.  LVH by voltage criteria.  September 2016 ECG (independently read by me):  Normal sinus rhythm at 65 bpm. Mild voltage for LVH.  01/13/2015 ECG (independently read by me): Sinus bradycardia 58 bpm.  Normal intervals.  October 2015 ECG (independent read by me): Sinus bradycardia at 50 bpm.  No ectopy.  PR interval 196 msec,   Borderline LVH.  Prior June 2015 ECG (independently read by me): Sinus bradycardia 52 beats per minute.  Borderline first degree AV block with a PR interval of 200 ms.  December 2014 ECG (independently read by me): Sinus rhythm at 54 beats per minute. No ectopy. Normal intervals.  ECG from 07/08/2013 : reveals sinus rhythm at 57 beats per minute. QTc interval 453 ms.  LABS: BMP Latest Ref Rng 12/03/2015 11/25/2015 11/01/2014  Glucose 65 - 99 mg/dL 110(H) 174(H) -  BUN 6 - 20 mg/dL 16 20 -  Creatinine 0.61 - 1.24 mg/dL 1.22 1.46(H) 1.6(A)  Sodium 135 - 145 mmol/L 141 140 -  Potassium 3.5 - 5.1 mmol/L 3.3(L) 4.3 -  Chloride 101 - 111 mmol/L 108 103 -  CO2 22 - 32 mmol/L 22 29 -  Calcium 8.9 - 10.3 mg/dL 8.7(L) 9.1 -   Hepatic Function Latest Ref Rng 11/25/2015    Total Protein 6.1 - 8.1 g/dL 7.1  Albumin 3.6 - 5.1 g/dL 4.3  AST 10 - 35 U/L 15  ALT 9 - 46 U/L 16  Alk Phosphatase 40 - 115 U/L 58  Total Bilirubin 0.2 - 1.2 mg/dL 0.6   CBC Latest Ref Rng 12/03/2015 11/25/2015 01/19/2012  WBC 4.0 - 10.5 K/uL 9.3 8.6 9.1  Hemoglobin 13.0 - 17.0 g/dL 12.2(L) 14.2 10.1(L)  Hematocrit 39.0 - 52.0 % 36.9(L) 41.9 30.1(L)  Platelets 150 - 400 K/uL 187 239 190   Lab Results  Component Value Date   MCV 90.0 12/03/2015   MCV 89.9 11/25/2015   MCV 88.3 01/19/2012   No results found for: TSH  Lab Results  Component Value Date   HGBA1C 10.7* 11/01/2014   Lipid Panel     Component Value Date/Time   CHOL 138 11/01/2014   HDL 39 11/01/2014     RADIOLOGY: No results found.    ASSESSMENT AND PLAN:  Roberto. Fells is a 75 year old white male who is status post CABG revascularization surgery in 1997 and has a documented occluded LIMA. He has  previously undergone intervention both to his proximal LAD extending into the left main with stenting and stenting of his mid LAD. In June 2013 he developed severe in-stent restenosis of his LAD stent and a DES stent was in place at that time. We also went through the stent struts in the left main and performed PTCA of the distal circumflex. He had mild/moderate disease in his PDA after his SVG to his PDA insertion. A previous nuclear perfusion study  in June 2014 after experiencing some vague episodes of chest pain was low risk and showed distal anteroseptal scar with the extent of 10% without associated ischemia.  Hiis most recent  nuclear study revealed a large new defect inferolaterally with scar/ischemia that was not present in 2014.  He underwent cardiac catheterization with findings as noted above.  His ischemia was secondary to new high-grade stenosis in the mid PDA and distal PDA for which he underwent successful stenting of the 95% stenosis which was reduced to 0% and PTCA of the very distal 75% stenosis reduced to 0%.   He received a polymer free bile freedoms stent and is enrolled in this study.  Area per study protocol, after one month of dual antiplatelet therapy.  He will be switched to a single agent suggested that he stop aspirin but continue Plavix.  He will be seeing the research nurse next week.  His blood pressure today is elevated despite his current medicines including but as a pill 40 mg, isosorbide 60 mg in the morning, 30 mrem at night, Toprol-XL 50 mg and amlodipine 7.5 mg.  I am titrating amlodipine to 10 mg.  He will monitor his blood pressure.  I will see him in 3 months for cardiology reevaluation or sooner if problem arise.  Time spent: 30 minutes  Troy Sine, MD, [facility]  12/22/2015 6:21 PM

## 2015-12-23 DIAGNOSIS — Z9861 Coronary angioplasty status: Secondary | ICD-10-CM | POA: Diagnosis not present

## 2015-12-23 DIAGNOSIS — E785 Hyperlipidemia, unspecified: Secondary | ICD-10-CM | POA: Diagnosis not present

## 2015-12-23 DIAGNOSIS — E119 Type 2 diabetes mellitus without complications: Secondary | ICD-10-CM | POA: Diagnosis not present

## 2015-12-23 DIAGNOSIS — Z79899 Other long term (current) drug therapy: Secondary | ICD-10-CM | POA: Diagnosis not present

## 2015-12-23 DIAGNOSIS — I10 Essential (primary) hypertension: Secondary | ICD-10-CM | POA: Diagnosis not present

## 2015-12-23 DIAGNOSIS — Z951 Presence of aortocoronary bypass graft: Secondary | ICD-10-CM | POA: Diagnosis not present

## 2015-12-23 LAB — GLUCOSE, CAPILLARY

## 2015-12-26 DIAGNOSIS — E119 Type 2 diabetes mellitus without complications: Secondary | ICD-10-CM | POA: Diagnosis not present

## 2015-12-26 DIAGNOSIS — Z951 Presence of aortocoronary bypass graft: Secondary | ICD-10-CM | POA: Diagnosis not present

## 2015-12-26 DIAGNOSIS — E785 Hyperlipidemia, unspecified: Secondary | ICD-10-CM | POA: Diagnosis not present

## 2015-12-26 DIAGNOSIS — Z9861 Coronary angioplasty status: Secondary | ICD-10-CM | POA: Diagnosis not present

## 2015-12-26 DIAGNOSIS — I10 Essential (primary) hypertension: Secondary | ICD-10-CM | POA: Diagnosis not present

## 2015-12-26 DIAGNOSIS — Z79899 Other long term (current) drug therapy: Secondary | ICD-10-CM | POA: Diagnosis not present

## 2015-12-28 ENCOUNTER — Encounter: Payer: Self-pay | Admitting: *Deleted

## 2015-12-28 ENCOUNTER — Encounter: Payer: Self-pay | Admitting: Cardiovascular Disease

## 2015-12-28 DIAGNOSIS — Z006 Encounter for examination for normal comparison and control in clinical research program: Secondary | ICD-10-CM

## 2015-12-28 NOTE — Progress Notes (Signed)
LEADERS FREE II month 1 follow-up completed. EKG obtained no adverse events or medication changes sent hospital discharge. Patient was instructed by Dr. Claiborne Billings to STOP ASA at the end of 30 days and continue Plavix. I reinforced these instructions and patient will be stopping ASA on 01/01/16. Questions encouraged and answered. New Informed Consent for LEADERS FREE II signed (with IRB approved date of Dec 23, 2015) and copy given to patient.

## 2015-12-30 DIAGNOSIS — E119 Type 2 diabetes mellitus without complications: Secondary | ICD-10-CM | POA: Diagnosis not present

## 2015-12-30 DIAGNOSIS — Z9861 Coronary angioplasty status: Secondary | ICD-10-CM | POA: Diagnosis not present

## 2015-12-30 DIAGNOSIS — Z79899 Other long term (current) drug therapy: Secondary | ICD-10-CM | POA: Diagnosis not present

## 2015-12-30 DIAGNOSIS — E785 Hyperlipidemia, unspecified: Secondary | ICD-10-CM | POA: Diagnosis not present

## 2015-12-30 DIAGNOSIS — Z951 Presence of aortocoronary bypass graft: Secondary | ICD-10-CM | POA: Diagnosis not present

## 2015-12-30 DIAGNOSIS — I10 Essential (primary) hypertension: Secondary | ICD-10-CM | POA: Diagnosis not present

## 2016-01-02 DIAGNOSIS — Z9861 Coronary angioplasty status: Secondary | ICD-10-CM | POA: Diagnosis not present

## 2016-01-02 DIAGNOSIS — E785 Hyperlipidemia, unspecified: Secondary | ICD-10-CM | POA: Diagnosis not present

## 2016-01-02 DIAGNOSIS — I10 Essential (primary) hypertension: Secondary | ICD-10-CM | POA: Diagnosis not present

## 2016-01-02 DIAGNOSIS — E119 Type 2 diabetes mellitus without complications: Secondary | ICD-10-CM | POA: Diagnosis not present

## 2016-01-02 DIAGNOSIS — Z951 Presence of aortocoronary bypass graft: Secondary | ICD-10-CM | POA: Diagnosis not present

## 2016-01-02 DIAGNOSIS — Z79899 Other long term (current) drug therapy: Secondary | ICD-10-CM | POA: Diagnosis not present

## 2016-01-04 DIAGNOSIS — Z79899 Other long term (current) drug therapy: Secondary | ICD-10-CM | POA: Diagnosis not present

## 2016-01-04 DIAGNOSIS — Z951 Presence of aortocoronary bypass graft: Secondary | ICD-10-CM | POA: Diagnosis not present

## 2016-01-04 DIAGNOSIS — I10 Essential (primary) hypertension: Secondary | ICD-10-CM | POA: Diagnosis not present

## 2016-01-04 DIAGNOSIS — Z9861 Coronary angioplasty status: Secondary | ICD-10-CM | POA: Diagnosis not present

## 2016-01-04 DIAGNOSIS — E119 Type 2 diabetes mellitus without complications: Secondary | ICD-10-CM | POA: Diagnosis not present

## 2016-01-04 DIAGNOSIS — E785 Hyperlipidemia, unspecified: Secondary | ICD-10-CM | POA: Diagnosis not present

## 2016-01-06 DIAGNOSIS — Z9861 Coronary angioplasty status: Secondary | ICD-10-CM | POA: Diagnosis not present

## 2016-01-06 DIAGNOSIS — E785 Hyperlipidemia, unspecified: Secondary | ICD-10-CM | POA: Diagnosis not present

## 2016-01-06 DIAGNOSIS — E119 Type 2 diabetes mellitus without complications: Secondary | ICD-10-CM | POA: Diagnosis not present

## 2016-01-06 DIAGNOSIS — Z79899 Other long term (current) drug therapy: Secondary | ICD-10-CM | POA: Diagnosis not present

## 2016-01-06 DIAGNOSIS — I10 Essential (primary) hypertension: Secondary | ICD-10-CM | POA: Diagnosis not present

## 2016-01-06 DIAGNOSIS — Z794 Long term (current) use of insulin: Secondary | ICD-10-CM | POA: Diagnosis not present

## 2016-01-06 DIAGNOSIS — Z955 Presence of coronary angioplasty implant and graft: Secondary | ICD-10-CM | POA: Diagnosis not present

## 2016-01-09 DIAGNOSIS — Z955 Presence of coronary angioplasty implant and graft: Secondary | ICD-10-CM | POA: Diagnosis not present

## 2016-01-09 DIAGNOSIS — Z9861 Coronary angioplasty status: Secondary | ICD-10-CM | POA: Diagnosis not present

## 2016-01-09 DIAGNOSIS — Z79899 Other long term (current) drug therapy: Secondary | ICD-10-CM | POA: Diagnosis not present

## 2016-01-09 DIAGNOSIS — E785 Hyperlipidemia, unspecified: Secondary | ICD-10-CM | POA: Diagnosis not present

## 2016-01-09 DIAGNOSIS — I10 Essential (primary) hypertension: Secondary | ICD-10-CM | POA: Diagnosis not present

## 2016-01-09 DIAGNOSIS — E119 Type 2 diabetes mellitus without complications: Secondary | ICD-10-CM | POA: Diagnosis not present

## 2016-01-11 DIAGNOSIS — I10 Essential (primary) hypertension: Secondary | ICD-10-CM | POA: Diagnosis not present

## 2016-01-11 DIAGNOSIS — Z79899 Other long term (current) drug therapy: Secondary | ICD-10-CM | POA: Diagnosis not present

## 2016-01-11 DIAGNOSIS — Z9861 Coronary angioplasty status: Secondary | ICD-10-CM | POA: Diagnosis not present

## 2016-01-11 DIAGNOSIS — E785 Hyperlipidemia, unspecified: Secondary | ICD-10-CM | POA: Diagnosis not present

## 2016-01-11 DIAGNOSIS — E119 Type 2 diabetes mellitus without complications: Secondary | ICD-10-CM | POA: Diagnosis not present

## 2016-01-11 DIAGNOSIS — Z955 Presence of coronary angioplasty implant and graft: Secondary | ICD-10-CM | POA: Diagnosis not present

## 2016-01-13 ENCOUNTER — Telehealth: Payer: Self-pay | Admitting: Cardiovascular Disease

## 2016-01-13 DIAGNOSIS — I10 Essential (primary) hypertension: Secondary | ICD-10-CM | POA: Diagnosis not present

## 2016-01-13 DIAGNOSIS — E119 Type 2 diabetes mellitus without complications: Secondary | ICD-10-CM | POA: Diagnosis not present

## 2016-01-13 DIAGNOSIS — Z79899 Other long term (current) drug therapy: Secondary | ICD-10-CM | POA: Diagnosis not present

## 2016-01-13 DIAGNOSIS — E785 Hyperlipidemia, unspecified: Secondary | ICD-10-CM | POA: Diagnosis not present

## 2016-01-13 DIAGNOSIS — Z9861 Coronary angioplasty status: Secondary | ICD-10-CM | POA: Diagnosis not present

## 2016-01-13 DIAGNOSIS — Z955 Presence of coronary angioplasty implant and graft: Secondary | ICD-10-CM | POA: Diagnosis not present

## 2016-01-13 NOTE — Telephone Encounter (Signed)
No answer. Left message to call back.   

## 2016-01-13 NOTE — Telephone Encounter (Signed)
New message      Roberto Haley at [facility] wanted to give a update on the progress of the pt at cardiac rehab, the nurse will be there for another 25-30 mins.

## 2016-01-13 NOTE — Telephone Encounter (Signed)
I have checked the fax machines. I have not seen any notes from [facility] about the patient at this time.

## 2016-01-13 NOTE — Telephone Encounter (Signed)
Follow-up      Christena Deem wants to know if you received the fax from today

## 2016-01-16 DIAGNOSIS — Z955 Presence of coronary angioplasty implant and graft: Secondary | ICD-10-CM | POA: Diagnosis not present

## 2016-01-16 DIAGNOSIS — E119 Type 2 diabetes mellitus without complications: Secondary | ICD-10-CM | POA: Diagnosis not present

## 2016-01-16 DIAGNOSIS — E785 Hyperlipidemia, unspecified: Secondary | ICD-10-CM | POA: Diagnosis not present

## 2016-01-16 DIAGNOSIS — Z9861 Coronary angioplasty status: Secondary | ICD-10-CM | POA: Diagnosis not present

## 2016-01-16 DIAGNOSIS — Z79899 Other long term (current) drug therapy: Secondary | ICD-10-CM | POA: Diagnosis not present

## 2016-01-16 DIAGNOSIS — I10 Essential (primary) hypertension: Secondary | ICD-10-CM | POA: Diagnosis not present

## 2016-01-17 ENCOUNTER — Other Ambulatory Visit: Payer: Self-pay | Admitting: *Deleted

## 2016-01-18 DIAGNOSIS — Z79899 Other long term (current) drug therapy: Secondary | ICD-10-CM | POA: Diagnosis not present

## 2016-01-18 DIAGNOSIS — Z9861 Coronary angioplasty status: Secondary | ICD-10-CM | POA: Diagnosis not present

## 2016-01-18 DIAGNOSIS — E785 Hyperlipidemia, unspecified: Secondary | ICD-10-CM | POA: Diagnosis not present

## 2016-01-18 DIAGNOSIS — Z955 Presence of coronary angioplasty implant and graft: Secondary | ICD-10-CM | POA: Diagnosis not present

## 2016-01-18 DIAGNOSIS — I10 Essential (primary) hypertension: Secondary | ICD-10-CM | POA: Diagnosis not present

## 2016-01-18 DIAGNOSIS — E119 Type 2 diabetes mellitus without complications: Secondary | ICD-10-CM | POA: Diagnosis not present

## 2016-01-19 ENCOUNTER — Telehealth: Payer: Self-pay | Admitting: Cardiovascular Disease

## 2016-01-19 DIAGNOSIS — I4891 Unspecified atrial fibrillation: Secondary | ICD-10-CM

## 2016-01-19 NOTE — Telephone Encounter (Signed)
Called patient to let him know that we have addressed his episode of a fib he experienced at [place]. Told him someone will be calling him in the next day or so to schedule a time to place the event monitor and to schedule an appt with Dr Claiborne Billings. He verbalized understanding.

## 2016-01-19 NOTE — Telephone Encounter (Signed)
Presented rehab strips to Dr Debara Pickett, [facility], to review. Upon reviewing strips and patient's medical history, he is reluctant at this time to place patient on severe anticoagulation therapy r/t hx subdural hematoma, etc. Patient is already on DAPT. He advised at this time for patient to wear a 30-day event monitor to see how often patient is in atrial fib and arrange an appt for Dr Claiborne Billings in the near future to review findings with the patient.  Will route to Dr Claiborne Billings for review as well.

## 2016-01-19 NOTE — Telephone Encounter (Signed)
Tried to call [facility] a second time. I spoke with the Nursing Director who was familiar with the patient.  On 01/11/16 patient was at rehab and had a. Fib on the monitor during the session. He had no complaints except for being fatigued. They are concerned because the pt is not on an anticoagulant.  Subsequent rehab sessions the patient has been noted as sinus rhythm on 01/13/16, 01/16/16, and 01/18/16. She is to fax most recent session and strips.  Dr Claiborne Billings is out of the office until Wednesday, this will be taken to Dr Debara Pickett, DOD.

## 2016-01-19 NOTE — Telephone Encounter (Signed)
Returned call to [facility] [place] and left message to call back.

## 2016-01-19 NOTE — Telephone Encounter (Signed)
Called [facility] and spoke with Judeen Hammans. Gave her the recommendations from Dr Debara Pickett.

## 2016-01-19 NOTE — Telephone Encounter (Signed)
New message      Last week cardiac rehab faxed to Dr Claiborne Billings info regarding new onset afib.  They were also needing to know if pt needed to be put on blood thinner.  Nurse is calling to follow up on that info.  Please call

## 2016-01-20 DIAGNOSIS — E119 Type 2 diabetes mellitus without complications: Secondary | ICD-10-CM | POA: Diagnosis not present

## 2016-01-20 DIAGNOSIS — Z79899 Other long term (current) drug therapy: Secondary | ICD-10-CM | POA: Diagnosis not present

## 2016-01-20 DIAGNOSIS — Z9861 Coronary angioplasty status: Secondary | ICD-10-CM | POA: Diagnosis not present

## 2016-01-20 DIAGNOSIS — E785 Hyperlipidemia, unspecified: Secondary | ICD-10-CM | POA: Diagnosis not present

## 2016-01-20 DIAGNOSIS — I10 Essential (primary) hypertension: Secondary | ICD-10-CM | POA: Diagnosis not present

## 2016-01-20 DIAGNOSIS — Z955 Presence of coronary angioplasty implant and graft: Secondary | ICD-10-CM | POA: Diagnosis not present

## 2016-01-23 DIAGNOSIS — E785 Hyperlipidemia, unspecified: Secondary | ICD-10-CM | POA: Diagnosis not present

## 2016-01-23 DIAGNOSIS — E119 Type 2 diabetes mellitus without complications: Secondary | ICD-10-CM | POA: Diagnosis not present

## 2016-01-23 DIAGNOSIS — I10 Essential (primary) hypertension: Secondary | ICD-10-CM | POA: Diagnosis not present

## 2016-01-23 DIAGNOSIS — Z9861 Coronary angioplasty status: Secondary | ICD-10-CM | POA: Diagnosis not present

## 2016-01-23 DIAGNOSIS — Z955 Presence of coronary angioplasty implant and graft: Secondary | ICD-10-CM | POA: Diagnosis not present

## 2016-01-23 DIAGNOSIS — Z79899 Other long term (current) drug therapy: Secondary | ICD-10-CM | POA: Diagnosis not present

## 2016-01-24 ENCOUNTER — Telehealth: Payer: Self-pay | Admitting: Internal Medicine

## 2016-01-24 NOTE — Telephone Encounter (Signed)
Called patient and left voicemail regarding appointment with Dr. Claiborne Billings on Thursday at 1:30 (01-26-16).

## 2016-01-25 DIAGNOSIS — E785 Hyperlipidemia, unspecified: Secondary | ICD-10-CM | POA: Diagnosis not present

## 2016-01-25 DIAGNOSIS — Z9861 Coronary angioplasty status: Secondary | ICD-10-CM | POA: Diagnosis not present

## 2016-01-25 DIAGNOSIS — E119 Type 2 diabetes mellitus without complications: Secondary | ICD-10-CM | POA: Diagnosis not present

## 2016-01-25 DIAGNOSIS — Z79899 Other long term (current) drug therapy: Secondary | ICD-10-CM | POA: Diagnosis not present

## 2016-01-25 DIAGNOSIS — Z955 Presence of coronary angioplasty implant and graft: Secondary | ICD-10-CM | POA: Diagnosis not present

## 2016-01-25 DIAGNOSIS — I10 Essential (primary) hypertension: Secondary | ICD-10-CM | POA: Diagnosis not present

## 2016-01-26 ENCOUNTER — Encounter: Payer: Self-pay | Admitting: Cardiovascular Disease

## 2016-01-26 ENCOUNTER — Ambulatory Visit (INDEPENDENT_AMBULATORY_CARE_PROVIDER_SITE_OTHER): Payer: Medicare Other | Admitting: Cardiovascular Disease

## 2016-01-26 VITALS — BP 132/74 | HR 58 | Ht 67.5 in | Wt 203.2 lb

## 2016-01-26 DIAGNOSIS — I493 Ventricular premature depolarization: Secondary | ICD-10-CM

## 2016-01-26 DIAGNOSIS — E785 Hyperlipidemia, unspecified: Secondary | ICD-10-CM

## 2016-01-26 DIAGNOSIS — I2510 Atherosclerotic heart disease of native coronary artery without angina pectoris: Secondary | ICD-10-CM

## 2016-01-26 DIAGNOSIS — I480 Paroxysmal atrial fibrillation: Secondary | ICD-10-CM

## 2016-01-26 DIAGNOSIS — E039 Hypothyroidism, unspecified: Secondary | ICD-10-CM

## 2016-01-26 DIAGNOSIS — E1142 Type 2 diabetes mellitus with diabetic polyneuropathy: Secondary | ICD-10-CM | POA: Diagnosis not present

## 2016-01-26 NOTE — Patient Instructions (Addendum)
Medication Instructions:    Your physician recommends that you continue on your current medications as directed. Please refer to the Current Medication list given to you today.  If you need a refill on your cardiac medications before your next appointment, please call your pharmacy.  Labwork:  NONE ORDER TODAY    Testing/Procedures:  NONE ORDER TODAY    Follow-Up:  IN 3 MONTHS WITH DR Claiborne Billings   Any Other Special Instructions Will Be Listed Below (If Applicable).

## 2016-01-27 DIAGNOSIS — Z955 Presence of coronary angioplasty implant and graft: Secondary | ICD-10-CM | POA: Diagnosis not present

## 2016-01-27 DIAGNOSIS — E785 Hyperlipidemia, unspecified: Secondary | ICD-10-CM | POA: Diagnosis not present

## 2016-01-27 DIAGNOSIS — E119 Type 2 diabetes mellitus without complications: Secondary | ICD-10-CM | POA: Diagnosis not present

## 2016-01-27 DIAGNOSIS — Z9861 Coronary angioplasty status: Secondary | ICD-10-CM | POA: Diagnosis not present

## 2016-01-27 DIAGNOSIS — Z79899 Other long term (current) drug therapy: Secondary | ICD-10-CM | POA: Diagnosis not present

## 2016-01-27 DIAGNOSIS — I10 Essential (primary) hypertension: Secondary | ICD-10-CM | POA: Diagnosis not present

## 2016-01-28 ENCOUNTER — Encounter: Payer: Self-pay | Admitting: Cardiovascular Disease

## 2016-01-28 DIAGNOSIS — I480 Paroxysmal atrial fibrillation: Secondary | ICD-10-CM | POA: Insufficient documentation

## 2016-01-28 NOTE — Progress Notes (Signed)
Patient ID: Roberto Haley, male   DOB: [birth year], 75 y.o.   MRN: [id number]     Primary M.D.: Dr. Nelda Bucks  HPI: Roberto Haley is a 75 y.o. male who presents to the office for a cardiology followup evaluation.   Roberto Haley has known CAD and underwent CABG revascularization surgery in 1997 with LIMA to LAD, vein to the RCA. In March 1998 he underwent complex intervention to his native LAD and high speed rotational atherectomy (HSRA) and stenting of his LAD and PTCA of his diagonal vessel after he was found to have stenosis in the LIMA graft. In June 2013 due to 99% in-stent restenosis in the LAD stent and a new stenosis of 90% the distal circumflex, he underwent two-vessel intervention with insertion of a 2.75x22 mm resident stent in the LAD and PTCA of the distal circumflex via his previously placed stent which started in the mid left main coronary artery. A cardiopulmonary met test showed reduced maximum oxygen consumption. I  reduced his beta blocker therapy due to significant chronotropic incompetence and further titrated his Ranexa to 1000 mg twice a day.  A nuclear perfusion study in June 2014 showed distal anteroseptal scar without significant ischemia. He did have PACs and PVCs in gating was not done.  He  fell on the Fourth of July 2014 and landed on his head. He ultimately was transferred to [facility] where was hospitalized for a week. He did not have neurosurgery. He was told of having "blood on his brain."  He did have a left-sided per hemiparesis. He then spent 2 months at [facility] rehabilitation unit and has spent additional time a [facility] in [place] until October 31.  He is significant improved following his head trauma with return of his memory.  Last year, I reduced his beta blocker therapy due to bradycardia.  He underwent a nuclear perfusion study on 11/22/2015 which now showed an ejection fraction of 42% with inferolateral hypokinesis.  There  was a new large defect in the inferolateral wall and there was no change in his previous anteroseptal defect.  There was concern for scar/ischemia inferolaterally which was new.   He underwent cardiac catheterization by me on 04/28/2017and was found to have significant native CAD with a patent stent extending from the mid left main into the proximal LAD, 50% narrowings in the LAD after the first diagonal vessel and widely patent mid LAD stent.  There was pain PTCA sites in the left circumflex proximally at the mid segment with 50% stenosis a small OM branch.  There was new total occlusion of his proximal native RCA with faint bridging collaterals and faint collateralization to the distal branch of the RCA which had not been supplied by the vein graft.  He had previously documented occluded LIMA graft.  The vein graft supplying the PDA and PLA vessel was patent but he had new distal disease with 95% stenosis in the mid PDA and very distal 75% stenosis.  He underwent successful intervention to his PDA and due to his prior history of head trauma and CNS bleed.  He underwent insertion of a research BioFreedom stent and after  30 days was transitioned to only single anti platelet therapy rather than DAPT.  Since undergoing his PCI to his mid PDA.  He has felt well.  He denies recurrent chest pain.  He has started cardiac rehabilitation.  While cardiac rehabilitation, been found to have transient episodes of PAF, which were noted on June  7 and 01/16/2016.  I reviewed these cardiac rehabilitation daily reports.  The patient is unaware of these episodes.  He denies associated chest pain.  He presents now for follow-up evaluation.   Past Medical History  Diagnosis Date  . Coronary artery disease   . Diabetes mellitus   . Groin hematoma 02/19/12    RIGHT  . Hyperlipidemia   . Hypertension   . Obesity   . PVC (premature ventricular contraction)   . Chronic kidney disease     CKD STAGE 3;  CHRONIC RENAL  INSUFFICIENCY  . Neuropathy due to type 2 diabetes mellitus ([place])   . Arthritis   . SDH (subdural hematoma) ([place]) 02/06/2013    R parafalcine SDH after a fall, rx at [facility]    Past Surgical History  Procedure Laterality Date  . Coronary artery bypass graft  1997    LIMA to the LAD and vein to the RCA;  . Cardiac catheterization  01/14/12    LV FXN EF 50-55% W/MILD DISTAL INFERIOR HYPOKINESIS; PCI: LAD PTCA/STENT W/NEW 2.75X22 RESOLUTE DES IN MID LAD POST DIALTED TO 3.08 TO 2.97 TAPER: 99%-80% TO 0; LCX: PTCA VIA LM STENT W/2.25X12 SPRINTER BALLOON: 90%-5; LAD: PATENT PROXIMAL STENT EXTENDING INTO LM W/30-40% SMOOTH NARROWING IN DISTAL PORTION OF STENT; 99% IN STENT RESTENOSIS OF MID LAD STENT   . Lower venous duplex  02/05/12    ESSENTIALLY NORMAL RIGHT LOWER EXTRIMTY VENOUS DUPLEX DOPPLER EVALUATION  . Lower arterial doppler  01/24/12    ESSENTIALLY NORMAL RIGHT GROIN DUPLEX EVALUATION S/P THROMBIN INJECTION  . Nm myoview ltd  01/09/12    HIGH RISK SCAN. COMPARED TO PREVIOUS STUDY, THERE IS NOW ISCHEMIA PRESENT. ABNORMAL MYOCARDIAL PERFUSION STUDY. THERE IS NEW MILD INFEROLATERAL ISCHEMIA TOWARDS THE BASE; PT TO FOLLOW UP WITH DR. TK  . Doppler echocardiography  01/09/12    LV NORMAL IN SIZE, NORMAL WAL THICKNESS, EF 50-55%; MILD POSTERIOR WALL HYOPKINESIS, THERE IS MILD INFERIOR WALL HYPOKINESIS  . Left heart catheterization with coronary/graft angiogram N/A 01/14/2012    Procedure: LEFT HEART CATHETERIZATION WITH Beatrix Fetters;  Surgeon: Troy Sine, MD;  Location: [facility] CATH LAB;  Service: Cardiovascular;  Laterality: N/A;  . Percutaneous coronary stent intervention (pci-s) N/A 01/14/2012    Procedure: PERCUTANEOUS CORONARY STENT INTERVENTION (PCI-S);  Surgeon: Troy Sine, MD;  Location: [facility] CATH LAB;  Service: Cardiovascular;  Laterality: N/A;  . Hypothenar fat pad transfer    . Coronary stent placement  12/02/2015    PAD  . Cardiac catheterization N/A 12/02/2015    Procedure: Left  Heart Cath and Coronary Angiography;  Surgeon: Troy Sine, MD;  Location: [place] CV LAB;  Service: Cardiovascular;  Laterality: N/A;  . Cardiac catheterization N/A 12/02/2015    Procedure: Coronary Stent Intervention;  Surgeon: Troy Sine, MD;  Location: [place] CV LAB;  Service: Cardiovascular;  Laterality: N/A;    Allergies  Allergen Reactions  . Tramadol Nausea And Vomiting  . Codeine Rash    Current Outpatient Prescriptions  Medication Sig Dispense Refill  . amLODipine (NORVASC) 10 MG tablet Take 1 tablet (10 mg total) by mouth daily. 30 tablet 11  . atorvastatin (LIPITOR) 40 MG tablet Take 40 mg by mouth daily.   0  . benazepril (LOTENSIN) 40 MG tablet Take 40 mg by mouth daily.    . cholecalciferol (VITAMIN D) 1000 UNITS tablet Take 1,000 Units by mouth daily.    . clopidogrel (PLAVIX) 75 MG tablet Take 1 tablet (75  mg total) by mouth daily with breakfast. 30 tablet 11  . insulin aspart (NOVOLOG) 100 UNIT/ML FlexPen Inject 12 Units into the skin 3 (three) times daily with meals.     . isosorbide mononitrate (IMDUR) 60 MG 24 hr tablet Take 1 tablet in the morning and 1/2 tablet at night. (Patient taking differently: Take 30-60 mg by mouth daily. Take 1 tablet in the morning and 1/2 tablet at night.) 45 tablet 11  . LEVEMIR FLEXTOUCH 100 UNIT/ML Pen Inject 35 Units into the skin 2 (two) times daily.   12  . LINZESS 145 MCG CAPS capsule TK 1 C PO D  11  . metFORMIN (GLUCOPHAGE-XR) 750 MG 24 hr tablet Take 750 mg by mouth daily with breakfast.    . metoprolol succinate (TOPROL-XL) 100 MG 24 hr tablet Take 50 mg by mouth daily.    . Multiple Vitamin (MULTIVITAMIN WITH MINERALS) TABS Take 1 tablet by mouth daily.    . nitroGLYCERIN (NITROSTAT) 0.4 MG SL tablet Place 1 tablet (0.4 mg total) under the tongue every 5 (five) minutes as needed. For chest pain 25 tablet 12  . NOVOFINE AUTOCOVER 30G X 8 MM MISC     . ONE TOUCH ULTRA TEST test strip   4  . oxybutynin (DITROPAN) 5  MG tablet Take 1 tablet by mouth daily.    Marland Kitchen PREVNAR 13 SUSP injection   0  . ranitidine (ZANTAC) 150 MG tablet Take 150 mg by mouth 2 (two) times daily.    Marland Kitchen SYNTHROID 75 MCG tablet Take 1 tablet by mouth daily.     . TRULICITY 1.5 QQ/5.9DG SOPN Inject 1.5 mg as directed once a week.   12  . vitamin C (ASCORBIC ACID) 500 MG tablet Take 500 mg by mouth daily.     No current facility-administered medications for this visit.    Socially, he is married has one child 2 grandchildren and 2 great-grandchildren. There is no tobacco or alcohol use.  ROS General: Negative; No fevers, chills, or night sweats;  HEENT: Positive for cataracts; No changes in hearing, sinus congestion, difficulty swallowing Pulmonary: Negative; No cough, wheezing, shortness of breath, hemoptysis Cardiovascular: See history of present illness;  GI: Positive for GERD on ranitidine; No nausea, vomiting, diarrhea, or abdominal pain GU: Negative; No dysuria, hematuria, or difficulty voiding Musculoskeletal: Negative; no myalgias, joint pain, or weakness Hematologic/Oncology: Negative; no easy bruising, bleeding Endocrine: Positive for hypothyroidism, on Synthroid replacement; diabetes not well controlled Neuro: Negative; no changes in balance, headaches Skin: Negative; No rashes or skin lesions Psychiatric: Negative; No behavioral problems, depression Sleep: Negative; No snoring, daytime sleepiness, hypersomnolence, bruxism, restless legs, hypnogognic hallucinations, no cataplexy Other comprehensive 14 point system review is negative.  PE BP 132/74 mmHg  Pulse 58  Ht 5' 7.5" (1.715 m)  Wt 203 lb 3.2 oz (92.171 kg)  BMI 31.34 kg/m2  Repeat blood pressure by me was 174/88.  Wt Readings from Last 3 Encounters:  01/26/16 203 lb 3.2 oz (92.171 kg)  12/21/15 202 lb 12.8 oz (91.989 kg)  12/15/15 204 lb (92.534 kg)   General: Alert, oriented, no distress.  HEENT: Normocephalic, atraumatic. Pupils round and reactive;  sclera anicteric; no lid lag. Atrophic muscles intact. No xanthelasmas. Nose without nasal septal hypertrophy Mouth/Parynx benign; Mallinpatti scale 3 Neck: No JVD, no carotid bruits with normal carotid upstroke Chest wall: Nontender to palpation Lungs: clear to ausculatation and percussion; no wheezing or rales Heart: RRR, s1 s2 normal 2/6 sem, unchanged, no S3,  gallop; no diastolic murmur.  No rubs, thrills or heaves Abdomen: soft, nontender; no hepatosplenomehaly, BS+; abdominal aorta nontender and not dilated by palpation. Mild central adiposity Pulses 2+; right groin catheterization site well-healed. Extremities: Trivial residual edema; no clubbing cyanosis, Homan's sign negative  Neurologic: grossly nonfocal Psychologic: Normal affect and mood  ECG (independently read by me): Sinus bradycardia 58 bpm.  Inferior Q waves.  Normal intervals.  No ST segment changes.  May 2017 ECG (independently read by me): Sinus bradycardia 59 bpm.  LVH by voltage criteria.  September 2016 ECG (independently read by me):  Normal sinus rhythm at 65 bpm. Mild voltage for LVH.  01/13/2015 ECG (independently read by me): Sinus bradycardia 58 bpm.  Normal intervals.  October 2015 ECG (independent read by me): Sinus bradycardia at 50 bpm.  No ectopy.  PR interval 196 msec,   Borderline LVH.  Prior June 2015 ECG (independently read by me): Sinus bradycardia 52 beats per minute.  Borderline first degree AV block with a PR interval of 200 ms.  December 2014 ECG (independently read by me): Sinus rhythm at 54 beats per minute. No ectopy. Normal intervals.  ECG from 07/08/2013 : reveals sinus rhythm at 57 beats per minute. QTc interval 453 ms.  LABS: BMP Latest Ref Rng 12/03/2015 11/25/2015 11/01/2014  Glucose 65 - 99 mg/dL 110(H) 174(H) -  BUN 6 - 20 mg/dL 16 20 -  Creatinine 0.61 - 1.24 mg/dL 1.22 1.46(H) 1.6(A)  Sodium 135 - 145 mmol/L 141 140 -  Potassium 3.5 - 5.1 mmol/L 3.3(L) 4.3 -  Chloride 101 - 111  mmol/L 108 103 -  CO2 22 - 32 mmol/L 22 29 -  Calcium 8.9 - 10.3 mg/dL 8.7(L) 9.1 -   Hepatic Function Latest Ref Rng 11/25/2015  Total Protein 6.1 - 8.1 g/dL 7.1  Albumin 3.6 - 5.1 g/dL 4.3  AST 10 - 35 U/L 15  ALT 9 - 46 U/L 16  Alk Phosphatase 40 - 115 U/L 58  Total Bilirubin 0.2 - 1.2 mg/dL 0.6   CBC Latest Ref Rng 12/03/2015 11/25/2015 01/19/2012  WBC 4.0 - 10.5 K/uL 9.3 8.6 9.1  Hemoglobin 13.0 - 17.0 g/dL 12.2(L) 14.2 10.1(L)  Hematocrit 39.0 - 52.0 % 36.9(L) 41.9 30.1(L)  Platelets 150 - 400 K/uL 187 239 190   Lab Results  Component Value Date   MCV 90.0 12/03/2015   MCV 89.9 11/25/2015   MCV 88.3 01/19/2012   No results found for: TSH   Lab Results  Component Value Date   HGBA1C 10.7* 11/01/2014   Lipid Panel      Component Value Date/Time   CHOL 138 11/01/2014   HDL 39 11/01/2014       RADIOLOGY: No results found.    ASSESSMENT AND PLAN:  Roberto Haley is a 75 year old white male who is status post CABG revascularization surgery in 1997 and has a documented occluded LIMA. He has previously undergone intervention both to his proximal LAD extending into the left main with stenting and stenting of his mid LAD. In June 2013 he developed severe in-stent restenosis of his LAD stent and a DES stent was in place at that time. We also went through the stent struts in the left main and performed PTCA of the distal circumflex. He had mild/moderate disease in his PDA after his SVG to his PDA insertion. A previous nuclear perfusion study  in June 2014 after experiencing some vague episodes of chest pain was low risk and showed distal anteroseptal scar  with the extent of 10% without associated ischemia.  His nuclear study in April 2017 revealed a large new defect inferolaterally with scar/ischemia that was not present in 2014.  He underwent cardiac catheterization with findings as noted above.  His ischemia was secondary to new high-grade stenosis in the mid PDA and distal PDA  for which he underwent successful stenting of the 95% stenosis which was reduced to 0% and PTCA of the very distal 75% stenosis reduced to 0%.  He wasn't rolled in the research trial and received a polymer free BioFreedom stent.  His aspirin is subsequently to been stopped and he continues to be on Plavix 75 mg daily.  He denies any bleeding and seems to tolerate this well.  He is not having anginal symptoms on isosorbide 60 mg in the morning and 30 mg at night in addition to his amlodipine 10 mg and Toprol 50 mg daily.  His blood pressure is stable and in addition to the above medicine is also on  benazapril 40 mg for BP control.  I reviewed his cardiac rehabilitation rhythm strips which confirm short brief episodes of PAF.  However, I am concerned that the patient had a significant head trauma bleed in the past.  This would place him at high risk for concomitant anticoagulation therapy particularly with his need for antiplatelet therapy.  His current heart rate is sinus bradycardia at this point, I will not further titrate his beta blocker but this may be necessary or it may be possible to add low-dose antiarrhythmic treatment, but I will not do this presently.  If he does note recurrent palpitations and event monitor will be obtained.  I will see him in several months for reevaluation.  Time spent: 25 minutes  Troy Sine, MD, [facility]  01/28/2016 9:45 AM

## 2016-01-30 DIAGNOSIS — Z79899 Other long term (current) drug therapy: Secondary | ICD-10-CM | POA: Diagnosis not present

## 2016-01-30 DIAGNOSIS — E119 Type 2 diabetes mellitus without complications: Secondary | ICD-10-CM | POA: Diagnosis not present

## 2016-01-30 DIAGNOSIS — E785 Hyperlipidemia, unspecified: Secondary | ICD-10-CM | POA: Diagnosis not present

## 2016-01-30 DIAGNOSIS — Z9861 Coronary angioplasty status: Secondary | ICD-10-CM | POA: Diagnosis not present

## 2016-01-30 DIAGNOSIS — Z955 Presence of coronary angioplasty implant and graft: Secondary | ICD-10-CM | POA: Diagnosis not present

## 2016-01-30 DIAGNOSIS — I10 Essential (primary) hypertension: Secondary | ICD-10-CM | POA: Diagnosis not present

## 2016-02-01 DIAGNOSIS — Z9861 Coronary angioplasty status: Secondary | ICD-10-CM | POA: Diagnosis not present

## 2016-02-01 DIAGNOSIS — Z955 Presence of coronary angioplasty implant and graft: Secondary | ICD-10-CM | POA: Diagnosis not present

## 2016-02-01 DIAGNOSIS — E119 Type 2 diabetes mellitus without complications: Secondary | ICD-10-CM | POA: Diagnosis not present

## 2016-02-01 DIAGNOSIS — I10 Essential (primary) hypertension: Secondary | ICD-10-CM | POA: Diagnosis not present

## 2016-02-01 DIAGNOSIS — Z79899 Other long term (current) drug therapy: Secondary | ICD-10-CM | POA: Diagnosis not present

## 2016-02-01 DIAGNOSIS — E785 Hyperlipidemia, unspecified: Secondary | ICD-10-CM | POA: Diagnosis not present

## 2016-02-02 ENCOUNTER — Encounter: Payer: Self-pay | Admitting: *Deleted

## 2016-02-02 DIAGNOSIS — Z006 Encounter for examination for normal comparison and control in clinical research program: Secondary | ICD-10-CM

## 2016-02-02 NOTE — Progress Notes (Signed)
LEADERS [facility] II Research 2 month telephone follow up completed. Patient is chest pain free and no adverse events. No changes in medication.

## 2016-02-03 DIAGNOSIS — E119 Type 2 diabetes mellitus without complications: Secondary | ICD-10-CM | POA: Diagnosis not present

## 2016-02-03 DIAGNOSIS — E785 Hyperlipidemia, unspecified: Secondary | ICD-10-CM | POA: Diagnosis not present

## 2016-02-03 DIAGNOSIS — Z955 Presence of coronary angioplasty implant and graft: Secondary | ICD-10-CM | POA: Diagnosis not present

## 2016-02-03 DIAGNOSIS — Z79899 Other long term (current) drug therapy: Secondary | ICD-10-CM | POA: Diagnosis not present

## 2016-02-03 DIAGNOSIS — Z9861 Coronary angioplasty status: Secondary | ICD-10-CM | POA: Diagnosis not present

## 2016-02-03 DIAGNOSIS — I10 Essential (primary) hypertension: Secondary | ICD-10-CM | POA: Diagnosis not present

## 2016-02-06 DIAGNOSIS — Z955 Presence of coronary angioplasty implant and graft: Secondary | ICD-10-CM | POA: Diagnosis not present

## 2016-02-08 ENCOUNTER — Encounter: Payer: Self-pay | Admitting: Cardiovascular Disease

## 2016-02-08 DIAGNOSIS — Z955 Presence of coronary angioplasty implant and graft: Secondary | ICD-10-CM | POA: Diagnosis not present

## 2016-02-10 DIAGNOSIS — Z955 Presence of coronary angioplasty implant and graft: Secondary | ICD-10-CM | POA: Diagnosis not present

## 2016-02-13 DIAGNOSIS — Z955 Presence of coronary angioplasty implant and graft: Secondary | ICD-10-CM | POA: Diagnosis not present

## 2016-02-15 DIAGNOSIS — Z955 Presence of coronary angioplasty implant and graft: Secondary | ICD-10-CM | POA: Diagnosis not present

## 2016-02-17 DIAGNOSIS — Z955 Presence of coronary angioplasty implant and graft: Secondary | ICD-10-CM | POA: Diagnosis not present

## 2016-02-20 DIAGNOSIS — Z955 Presence of coronary angioplasty implant and graft: Secondary | ICD-10-CM | POA: Diagnosis not present

## 2016-02-22 DIAGNOSIS — Z955 Presence of coronary angioplasty implant and graft: Secondary | ICD-10-CM | POA: Diagnosis not present

## 2016-02-24 DIAGNOSIS — Z955 Presence of coronary angioplasty implant and graft: Secondary | ICD-10-CM | POA: Diagnosis not present

## 2016-02-27 DIAGNOSIS — Z955 Presence of coronary angioplasty implant and graft: Secondary | ICD-10-CM | POA: Diagnosis not present

## 2016-02-29 DIAGNOSIS — E1129 Type 2 diabetes mellitus with other diabetic kidney complication: Secondary | ICD-10-CM | POA: Diagnosis not present

## 2016-02-29 DIAGNOSIS — E039 Hypothyroidism, unspecified: Secondary | ICD-10-CM | POA: Diagnosis not present

## 2016-02-29 DIAGNOSIS — Z955 Presence of coronary angioplasty implant and graft: Secondary | ICD-10-CM | POA: Diagnosis not present

## 2016-02-29 DIAGNOSIS — E785 Hyperlipidemia, unspecified: Secondary | ICD-10-CM | POA: Diagnosis not present

## 2016-02-29 DIAGNOSIS — I10 Essential (primary) hypertension: Secondary | ICD-10-CM | POA: Diagnosis not present

## 2016-03-02 DIAGNOSIS — Z955 Presence of coronary angioplasty implant and graft: Secondary | ICD-10-CM | POA: Diagnosis not present

## 2016-03-05 DIAGNOSIS — Z955 Presence of coronary angioplasty implant and graft: Secondary | ICD-10-CM | POA: Diagnosis not present

## 2016-03-07 DIAGNOSIS — Z955 Presence of coronary angioplasty implant and graft: Secondary | ICD-10-CM | POA: Diagnosis not present

## 2016-03-09 DIAGNOSIS — Z955 Presence of coronary angioplasty implant and graft: Secondary | ICD-10-CM | POA: Diagnosis not present

## 2016-03-12 ENCOUNTER — Ambulatory Visit: Payer: Medicare Other | Admitting: Cardiovascular Disease

## 2016-03-12 DIAGNOSIS — Z955 Presence of coronary angioplasty implant and graft: Secondary | ICD-10-CM | POA: Diagnosis not present

## 2016-03-14 DIAGNOSIS — Z955 Presence of coronary angioplasty implant and graft: Secondary | ICD-10-CM | POA: Diagnosis not present

## 2016-03-23 ENCOUNTER — Encounter (HOSPITAL_COMMUNITY): Payer: Self-pay | Admitting: *Deleted

## 2016-03-30 DIAGNOSIS — E782 Mixed hyperlipidemia: Secondary | ICD-10-CM | POA: Diagnosis not present

## 2016-03-30 DIAGNOSIS — E1165 Type 2 diabetes mellitus with hyperglycemia: Secondary | ICD-10-CM | POA: Diagnosis not present

## 2016-04-10 DIAGNOSIS — N183 Chronic kidney disease, stage 3 (moderate): Secondary | ICD-10-CM | POA: Diagnosis not present

## 2016-04-10 DIAGNOSIS — E782 Mixed hyperlipidemia: Secondary | ICD-10-CM | POA: Diagnosis not present

## 2016-04-10 DIAGNOSIS — I2510 Atherosclerotic heart disease of native coronary artery without angina pectoris: Secondary | ICD-10-CM | POA: Diagnosis not present

## 2016-04-10 DIAGNOSIS — R809 Proteinuria, unspecified: Secondary | ICD-10-CM | POA: Diagnosis not present

## 2016-04-10 DIAGNOSIS — I10 Essential (primary) hypertension: Secondary | ICD-10-CM | POA: Diagnosis not present

## 2016-04-10 DIAGNOSIS — E1165 Type 2 diabetes mellitus with hyperglycemia: Secondary | ICD-10-CM | POA: Diagnosis not present

## 2016-04-10 DIAGNOSIS — E1122 Type 2 diabetes mellitus with diabetic chronic kidney disease: Secondary | ICD-10-CM | POA: Diagnosis not present

## 2016-04-10 DIAGNOSIS — Z794 Long term (current) use of insulin: Secondary | ICD-10-CM | POA: Diagnosis not present

## 2016-05-24 ENCOUNTER — Encounter: Payer: Self-pay | Admitting: *Deleted

## 2016-05-24 DIAGNOSIS — Z006 Encounter for examination for normal comparison and control in clinical research program: Secondary | ICD-10-CM

## 2016-05-24 NOTE — Progress Notes (Signed)
LEADERS [facility] II 6 Month office visit completed. EKG obtained. States he has experienced some angina with exertion but tolerable and stable. Next Research appointment is due no later than 12/26/16. Questions encouraged and answered.

## 2016-05-31 ENCOUNTER — Encounter: Payer: Self-pay | Admitting: Cardiovascular Disease

## 2016-05-31 DIAGNOSIS — I129 Hypertensive chronic kidney disease with stage 1 through stage 4 chronic kidney disease, or unspecified chronic kidney disease: Secondary | ICD-10-CM | POA: Diagnosis not present

## 2016-05-31 DIAGNOSIS — E039 Hypothyroidism, unspecified: Secondary | ICD-10-CM | POA: Diagnosis not present

## 2016-05-31 DIAGNOSIS — Z125 Encounter for screening for malignant neoplasm of prostate: Secondary | ICD-10-CM | POA: Diagnosis not present

## 2016-05-31 DIAGNOSIS — E785 Hyperlipidemia, unspecified: Secondary | ICD-10-CM | POA: Diagnosis not present

## 2016-05-31 DIAGNOSIS — Z6831 Body mass index (BMI) 31.0-31.9, adult: Secondary | ICD-10-CM | POA: Diagnosis not present

## 2016-05-31 DIAGNOSIS — E669 Obesity, unspecified: Secondary | ICD-10-CM | POA: Diagnosis not present

## 2016-05-31 DIAGNOSIS — E1129 Type 2 diabetes mellitus with other diabetic kidney complication: Secondary | ICD-10-CM | POA: Diagnosis not present

## 2016-05-31 DIAGNOSIS — Z23 Encounter for immunization: Secondary | ICD-10-CM | POA: Diagnosis not present

## 2016-07-12 DIAGNOSIS — E782 Mixed hyperlipidemia: Secondary | ICD-10-CM | POA: Diagnosis not present

## 2016-07-12 DIAGNOSIS — E1165 Type 2 diabetes mellitus with hyperglycemia: Secondary | ICD-10-CM | POA: Diagnosis not present

## 2016-07-17 DIAGNOSIS — Z794 Long term (current) use of insulin: Secondary | ICD-10-CM | POA: Diagnosis not present

## 2016-07-17 DIAGNOSIS — E782 Mixed hyperlipidemia: Secondary | ICD-10-CM | POA: Diagnosis not present

## 2016-07-17 DIAGNOSIS — E1122 Type 2 diabetes mellitus with diabetic chronic kidney disease: Secondary | ICD-10-CM | POA: Diagnosis not present

## 2016-07-17 DIAGNOSIS — E1165 Type 2 diabetes mellitus with hyperglycemia: Secondary | ICD-10-CM | POA: Diagnosis not present

## 2016-07-17 DIAGNOSIS — I10 Essential (primary) hypertension: Secondary | ICD-10-CM | POA: Diagnosis not present

## 2016-07-17 DIAGNOSIS — R809 Proteinuria, unspecified: Secondary | ICD-10-CM | POA: Diagnosis not present

## 2016-07-17 DIAGNOSIS — N183 Chronic kidney disease, stage 3 (moderate): Secondary | ICD-10-CM | POA: Diagnosis not present

## 2016-07-17 DIAGNOSIS — I2510 Atherosclerotic heart disease of native coronary artery without angina pectoris: Secondary | ICD-10-CM | POA: Diagnosis not present

## 2016-09-05 DIAGNOSIS — Z1211 Encounter for screening for malignant neoplasm of colon: Secondary | ICD-10-CM | POA: Diagnosis not present

## 2016-09-05 DIAGNOSIS — I129 Hypertensive chronic kidney disease with stage 1 through stage 4 chronic kidney disease, or unspecified chronic kidney disease: Secondary | ICD-10-CM | POA: Diagnosis not present

## 2016-09-05 DIAGNOSIS — E785 Hyperlipidemia, unspecified: Secondary | ICD-10-CM | POA: Diagnosis not present

## 2016-09-05 DIAGNOSIS — E039 Hypothyroidism, unspecified: Secondary | ICD-10-CM | POA: Diagnosis not present

## 2016-09-05 DIAGNOSIS — E1129 Type 2 diabetes mellitus with other diabetic kidney complication: Secondary | ICD-10-CM | POA: Diagnosis not present

## 2016-09-27 DIAGNOSIS — K219 Gastro-esophageal reflux disease without esophagitis: Secondary | ICD-10-CM | POA: Diagnosis not present

## 2016-09-27 DIAGNOSIS — K5900 Constipation, unspecified: Secondary | ICD-10-CM | POA: Diagnosis not present

## 2016-10-02 ENCOUNTER — Telehealth: Payer: Self-pay | Admitting: *Deleted

## 2016-10-02 NOTE — Telephone Encounter (Signed)
Patient notified that he needs appointment to see Dr Claiborne Billings for clearance to have EGD/COLON scheduled for 10/26/16. Add on Appointment scheduled for Monday March 5th @ 4:00.

## 2016-10-08 ENCOUNTER — Ambulatory Visit (INDEPENDENT_AMBULATORY_CARE_PROVIDER_SITE_OTHER): Payer: Medicare Other | Admitting: Cardiovascular Disease

## 2016-10-08 ENCOUNTER — Encounter: Payer: Self-pay | Admitting: Cardiovascular Disease

## 2016-10-08 VITALS — BP 126/70 | HR 61 | Ht 68.0 in | Wt 206.0 lb

## 2016-10-08 DIAGNOSIS — I10 Essential (primary) hypertension: Secondary | ICD-10-CM | POA: Diagnosis not present

## 2016-10-08 DIAGNOSIS — I480 Paroxysmal atrial fibrillation: Secondary | ICD-10-CM

## 2016-10-08 DIAGNOSIS — I2583 Coronary atherosclerosis due to lipid rich plaque: Secondary | ICD-10-CM

## 2016-10-08 DIAGNOSIS — I2510 Atherosclerotic heart disease of native coronary artery without angina pectoris: Secondary | ICD-10-CM

## 2016-10-08 DIAGNOSIS — E785 Hyperlipidemia, unspecified: Secondary | ICD-10-CM

## 2016-10-08 MED ORDER — ISOSORBIDE MONONITRATE ER 60 MG PO TB24: ORAL_TABLET | ORAL | 11 refills | Status: DC

## 2016-10-08 NOTE — Patient Instructions (Addendum)
Medication Instructions:  The isosorbide had been increased to 1.5 tablets in the AM and 1/2 tablet at night.   Labwork:  NONE  Testing/Procedures: Your physician has requested that you have an exercise stress myoview.   Follow-Up: 4-5 weeks with Dr Claiborne Billings  Any Other Special Instructions Will Be Listed Below (If Applicable).  YOU HAVE NOT BEEN CLEARED TO HAVE YOUR COLONOSCOPY.

## 2016-10-08 NOTE — Progress Notes (Signed)
Patient ID: Roberto Haley, male   DOB: 22-Sep-1940, 76 y.o.   MRN: [id number]     Primary M.D.: Dr. Nelda Bucks  HPI: Roberto Haley is a 76 y.o. male who presents to the office for a 9 month cardiology followup evaluation.   Roberto Haley has known CAD and underwent CABG revascularization surgery in 1997 with LIMA to LAD, vein to the RCA. In March 1998 Roberto Haley underwent complex intervention to his native LAD and high speed rotational atherectomy (HSRA) and stenting of his LAD and PTCA of his diagonal vessel after Roberto Haley was found to have stenosis in the LIMA graft. In June 2013 due to 99% in-stent restenosis in the LAD stent and a new stenosis of 90% the distal circumflex, Roberto Haley underwent two-vessel intervention with insertion of a 2.75x22 mm resident stent in the LAD and PTCA of the distal circumflex via his previously placed stent which started in the mid left main coronary artery. A cardiopulmonary met test showed reduced maximum oxygen consumption. I  reduced his beta blocker therapy due to significant chronotropic incompetence and further titrated his Ranexa to 1000 mg twice a day.  A nuclear perfusion study in June 2014 showed distal anteroseptal scar without significant ischemia. Roberto Haley did have PACs and PVCs in gating was not done.  Roberto Haley  fell on the Fourth of July 2014 and landed on his head. Roberto Haley ultimately was transferred to [facility] where was hospitalized for a week. Roberto Haley did not have neurosurgery. 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 Roberto Haley then spent 2 months at [facility] rehabilitation unit and has spent additional time a [facility] in [place] until October 31.  Roberto Haley is significant improved following his head trauma with return of his memory.  Last year, I reduced his beta blocker therapy due to bradycardia.  Roberto Haley underwent a nuclear perfusion study on 11/22/2015 which now showed an ejection fraction of 42% with inferolateral hypokinesis.   There was a new large defect in the inferolateral wall and there was no change in his previous anteroseptal defect.  There was concern for scar/ischemia inferolaterally which was new.   Roberto Haley underwent cardiac catheterization by me on 04/28/2017and was found to have significant native CAD with a patent stent extending from the mid left main into the proximal LAD, 50% narrowings in the LAD after the first diagonal vessel and widely patent mid LAD stent.  There was pain PTCA sites in the left circumflex proximally at the mid segment with 50% stenosis a small OM branch.  There was new total occlusion of his proximal native RCA with faint bridging collaterals and faint collateralization to the distal branch of the RCA which had not been supplied by the vein graft.  Roberto Haley had previously documented occluded LIMA graft.  The vein graft supplying the PDA and PLA vessel was patent but Roberto Haley had new distal disease with 95% stenosis in the mid PDA and very distal 75% stenosis.  Roberto Haley underwent successful intervention to his PDA and due to his prior history of head trauma and CNS bleed.  Roberto Haley underwent insertion of a research BioFreedom stent and after  30 days was transitioned to only single anti platelet therapy rather than DAPT.  Since undergoing his PCI to his mid PDA.  Since his interventional procedure, Roberto Haley has noted marked improvement in his ability to be active.  Roberto Haley continues to participate in cardiac rehabilitation and often walks 1 mile in the morning  before breakfast and feels well.  Roberto Haley has noticed occasional angina episodes of angina, particularly when Roberto Haley is tired.    While cardiac rehabilitation, been found to have transient episodes of PAF, which were noted on June 7 and 01/16/2016.  Roberto Haley states that Roberto Haley has not been sleeping very well.  Roberto Haley has not had any GI issues, but Roberto Haley has only had one colonoscopy in his life and Roberto Haley is 76 years old.  Roberto Haley is seen by Dr. Lyda Jester in [place] and was scheduled for screening colonoscopy and  EGD on March 23 and will need to hold Plavix.  Of note, Roberto Haley had had lab done by his primary physician at [facility] in October.  Glucose was increased at 166.  His LDL cholesterol was 62 with a total cholesterol 122.  Hemoglobin A1c was 6.9.  PSA was 2.3.  Roberto Haley presents for evaluation.  Past Medical History:  Diagnosis Date  . Arthritis   . Chronic kidney disease    CKD STAGE 3;  CHRONIC RENAL INSUFFICIENCY  . Coronary artery disease   . Diabetes mellitus   . Groin hematoma 02/19/12   RIGHT  . Hyperlipidemia   . Hypertension   . Neuropathy due to type 2 diabetes mellitus ([place])   . Obesity   . PVC (premature ventricular contraction)   . SDH (subdural hematoma) ([place]) 02/06/2013   R parafalcine SDH after a fall, rx at [facility]    Past Surgical History:  Procedure Laterality Date  . CARDIAC CATHETERIZATION  01/14/12   LV FXN EF 50-55% W/MILD DISTAL INFERIOR HYPOKINESIS; PCI: LAD PTCA/STENT W/NEW 2.75X22 RESOLUTE DES IN MID LAD POST DIALTED TO 3.08 TO 2.97 TAPER: 99%-80% TO 0; LCX: PTCA VIA LM STENT W/2.25X12 SPRINTER BALLOON: 90%-5; LAD: PATENT PROXIMAL STENT EXTENDING INTO LM W/30-40% SMOOTH NARROWING IN DISTAL PORTION OF STENT; 99% IN STENT RESTENOSIS OF MID LAD STENT   . CARDIAC CATHETERIZATION N/A 12/02/2015   Procedure: Left Heart Cath and Coronary Angiography;  Surgeon: Troy Sine, MD;  Location: [place] CV LAB;  Service: Cardiovascular;  Laterality: N/A;  . CARDIAC CATHETERIZATION N/A 12/02/2015   Procedure: Coronary Stent Intervention;  Surgeon: Troy Sine, MD;  Location: [place] CV LAB;  Service: Cardiovascular;  Laterality: N/A;  . CORONARY ARTERY BYPASS GRAFT  1997   LIMA to the LAD and vein to the RCA;  . CORONARY STENT PLACEMENT  12/02/2015   PAD  . DOPPLER ECHOCARDIOGRAPHY  01/09/12   LV NORMAL IN SIZE, NORMAL WAL THICKNESS, EF 50-55%; MILD POSTERIOR WALL HYOPKINESIS, THERE IS MILD INFERIOR WALL HYPOKINESIS  . HYPOTHENAR FAT PAD TRANSFER    . LEFT HEART  CATHETERIZATION WITH CORONARY/GRAFT ANGIOGRAM N/A 01/14/2012   Procedure: LEFT HEART CATHETERIZATION WITH Beatrix Fetters;  Surgeon: Troy Sine, MD;  Location: [facility] CATH LAB;  Service: Cardiovascular;  Laterality: N/A;  . LOWER ARTERIAL DOPPLER  01/24/12   ESSENTIALLY NORMAL RIGHT GROIN DUPLEX EVALUATION S/P THROMBIN INJECTION  . LOWER VENOUS DUPLEX  02/05/12   ESSENTIALLY NORMAL RIGHT LOWER EXTRIMTY VENOUS DUPLEX DOPPLER EVALUATION  . NM MYOVIEW LTD  01/09/12   HIGH RISK SCAN. COMPARED TO PREVIOUS STUDY, THERE IS NOW ISCHEMIA PRESENT. ABNORMAL MYOCARDIAL PERFUSION STUDY. THERE IS NEW MILD INFEROLATERAL ISCHEMIA TOWARDS THE BASE; PT TO FOLLOW UP WITH DR. [name]  . PERCUTANEOUS CORONARY STENT INTERVENTION (PCI-S) N/A 01/14/2012   Procedure: PERCUTANEOUS CORONARY STENT INTERVENTION (PCI-S);  Surgeon: Troy Sine, MD;  Location: [facility] CATH LAB;  Service: Cardiovascular;  Laterality: N/A;  Allergies  Allergen Reactions  . Diltiazem Other (See Comments)    Unknown  . Lovastatin Other (See Comments)    Unknown  . Proton Pump Inhibitors Other (See Comments)    Unknown   . Tramadol Nausea And Vomiting  . Codeine Rash    Current Outpatient Prescriptions  Medication Sig Dispense Refill  . amLODipine (NORVASC) 10 MG tablet Take 1 tablet (10 mg total) by mouth daily. 30 tablet 11  . atorvastatin (LIPITOR) 40 MG tablet Take 40 mg by mouth daily.   0  . benazepril (LOTENSIN) 40 MG tablet Take 40 mg by mouth daily.    . cholecalciferol (VITAMIN D) 1000 UNITS tablet Take 1,000 Units by mouth daily.    . clopidogrel (PLAVIX) 75 MG tablet Take 1 tablet (75 mg total) by mouth daily with breakfast. 30 tablet 11  . insulin aspart (NOVOLOG) 100 UNIT/ML FlexPen Inject 12 Units into the skin 3 (three) times daily with meals.     . isosorbide mononitrate (IMDUR) 60 MG 24 hr tablet Take 1.5 tablets in the morning (33m) and 1/2 tablet at night (30 mg) 60 tablet 11  . LEVEMIR FLEXTOUCH 100 UNIT/ML Pen Inject  35 Units into the skin 2 (two) times daily.   12  . LINZESS 145 MCG CAPS capsule TK 1 C PO D  11  . metFORMIN (GLUCOPHAGE-XR) 750 MG 24 hr tablet Take 750 mg by mouth daily with breakfast.    . metoprolol succinate (TOPROL-XL) 100 MG 24 hr tablet Take 50 mg by mouth daily.    . Multiple Vitamin (MULTIVITAMIN WITH MINERALS) TABS Take 1 tablet by mouth daily.    . nitroGLYCERIN (NITROSTAT) 0.4 MG SL tablet Place 1 tablet (0.4 mg total) under the tongue every 5 (five) minutes as needed. For chest pain 25 tablet 12  . NOVOFINE AUTOCOVER 30G X 8 MM MISC     . ONE TOUCH ULTRA TEST test strip   4  . oxybutynin (DITROPAN) 5 MG tablet Take 1 tablet by mouth daily.    .Marland KitchenPREVNAR 13 SUSP injection   0  . ranitidine (ZANTAC) 150 MG tablet Take 150 mg by mouth 2 (two) times daily.    .Marland KitchenSYNTHROID 75 MCG tablet Take 1 tablet by mouth daily.     . TRULICITY 1.5 MSP/2.3RASOPN Inject 1.5 mg as directed once a week.   12  . vitamin C (ASCORBIC ACID) 500 MG tablet Take 500 mg by mouth daily.     No current facility-administered medications for this visit.     Socially, Roberto Haley is married has one child 2 grandchildren and 2 great-grandchildren. There is no tobacco or alcohol use.  ROS General: Negative; No fevers, chills, or night sweats;  HEENT: Positive for cataracts; No changes in hearing, sinus congestion, difficulty swallowing Pulmonary: Negative; No cough, wheezing, shortness of breath, hemoptysis Cardiovascular: See history of present illness;  GI: Positive for GERD on ranitidine; No nausea, vomiting, diarrhea, or abdominal pain GU: Negative; No dysuria, hematuria, or difficulty voiding Musculoskeletal: Negative; no myalgias, joint pain, or weakness Hematologic/Oncology: Negative; no easy bruising, bleeding Endocrine: Positive for hypothyroidism, on Synthroid replacement; diabetes not well controlled Neuro: Negative; no changes in balance, headaches Skin: Negative; No rashes or skin lesions Psychiatric:  Negative; No behavioral problems, depression Sleep: Negative; No snoring, daytime sleepiness, hypersomnolence, bruxism, restless legs, hypnogognic hallucinations, no cataplexy Other comprehensive 14 point system review is negative.  PE BP 126/70   Pulse 61   Ht '5\' 8"'  (1.727  m)   Wt 206 lb (93.4 kg)   BMI 31.32 kg/m    Wt Readings from Last 3 Encounters:  10/08/16 206 lb (93.4 kg)  01/26/16 203 lb 3.2 oz (92.2 kg)  12/21/15 202 lb 12.8 oz (92 kg)   General: Alert, oriented, no distress.  HEENT: Normocephalic, atraumatic. Pupils round and reactive; sclera anicteric; no lid lag. Atrophic muscles intact. No xanthelasmas. Nose without nasal septal hypertrophy Mouth/Parynx benign; Mallinpatti scale 3 Neck: No JVD, no carotid bruits with normal carotid upstroke Chest wall: Nontender to palpation Lungs: clear to ausculatation and percussion; no wheezing or rales Heart: RRR, s1 s2 normal 2/6 sem, unchanged, no S3, gallop; no diastolic murmur.  No rubs, thrills or heaves Abdomen: soft, nontender; no hepatosplenomehaly, BS+; abdominal aorta nontender and not dilated by palpation. Mild central adiposity Pulses 2+; right groin catheterization site well-healed. Extremities: Trivial residual edema; no clubbing cyanosis, Homan's sign negative  Neurologic: grossly nonfocal Psychologic: Normal affect and mood  ECG (independently read by me): Normal sinus rhythm at 61 bpm.  First-degree AV block with a PR interval 206 ms.  Q wave in 3 and aVF.  June 2017 ECG (independently read by me): Sinus bradycardia 58 bpm.  Inferior Q waves.  Normal intervals.  No ST segment changes.  May 2017 ECG (independently read by me): Sinus bradycardia 59 bpm.  LVH by voltage criteria.  September 2016 ECG (independently read by me):  Normal sinus rhythm at 65 bpm. Mild voltage for LVH.  01/13/2015 ECG (independently read by me): Sinus bradycardia 58 bpm.  Normal intervals.  October 2015 ECG (independent read by me):  Sinus bradycardia at 50 bpm.  No ectopy.  PR interval 196 msec,   Borderline LVH.  Prior June 2015 ECG (independently read by me): Sinus bradycardia 52 beats per minute.  Borderline first degree AV block with a PR interval of 200 ms.  December 2014 ECG (independently read by me): Sinus rhythm at 54 beats per minute. No ectopy. Normal intervals.  ECG from 07/08/2013 : reveals sinus rhythm at 57 beats per minute. QTc interval 453 ms.  LABS: BMP Latest Ref Rng & Units 12/03/2015 11/25/2015 11/01/2014  Glucose 65 - 99 mg/dL 110(H) 174(H) -  BUN 6 - 20 mg/dL 16 20 -  Creatinine 0.61 - 1.24 mg/dL 1.22 1.46(H) 1.6(A)  Sodium 135 - 145 mmol/L 141 140 -  Potassium 3.5 - 5.1 mmol/L 3.3(L) 4.3 -  Chloride 101 - 111 mmol/L 108 103 -  CO2 22 - 32 mmol/L 22 29 -  Calcium 8.9 - 10.3 mg/dL 8.7(L) 9.1 -   Hepatic Function Latest Ref Rng & Units 11/25/2015  Total Protein 6.1 - 8.1 g/dL 7.1  Albumin 3.6 - 5.1 g/dL 4.3  AST 10 - 35 U/L 15  ALT 9 - 46 U/L 16  Alk Phosphatase 40 - 115 U/L 58  Total Bilirubin 0.2 - 1.2 mg/dL 0.6   CBC Latest Ref Rng & Units 12/03/2015 11/25/2015 01/19/2012  WBC 4.0 - 10.5 K/uL 9.3 8.6 9.1  Hemoglobin 13.0 - 17.0 g/dL 12.2(L) 14.2 10.1(L)  Hematocrit 39.0 - 52.0 % 36.9(L) 41.9 30.1(L)  Platelets 150 - 400 K/uL 187 239 190   Lab Results  Component Value Date   MCV 90.0 12/03/2015   MCV 89.9 11/25/2015   MCV 88.3 01/19/2012   No results found for: TSH   Lab Results  Component Value Date   HGBA1C 10.7 (A) 11/01/2014   Lipid Panel      Component Value  Date/Time   CHOL 138 11/01/2014   HDL 39 11/01/2014    RADIOLOGY: No results found.  IMPRESSION:  1. CAD in native artery   2. Coronary artery disease due to lipid rich plaque   3. Essential hypertension   4. PAF (paroxysmal atrial fibrillation) ([place])   5. Hyperlipidemia LDL goal <70     ASSESSMENT AND PLAN:  Roberto Haley is a 76 year old white male who is status post CABG revascularization surgery in 1997  and has a documented occluded LIMA. Roberto Haley has previously undergone intervention both to his proximal LAD extending into the left main with stenting and stenting of his mid LAD. In June 2013 Roberto Haley developed severe in-stent restenosis of his LAD stent and a DES stent was in place at that time. We also went through the stent struts in the left main and performed PTCA of the distal circumflex. Roberto Haley had mild/moderate disease in his PDA after his SVG to his PDA insertion. A previous nuclear perfusion study  in June 2014 after experiencing some vague episodes of chest pain was low risk and showed distal anteroseptal scar with the extent of 10% without associated ischemia.  His nuclear study in April 2017 revealed a large new defect inferolaterally with scar/ischemia that was not present in 2014.  Roberto Haley underwent cardiac catheterization with findings as noted above.  His ischemia was secondary to new high-grade stenosis in the mid PDA and distal PDA for which Roberto Haley underwent successful stenting of the 95% stenosis which was reduced to 0% and PTCA of the very distal 75% stenosis reduced to 0%.  Roberto Haley was enrolled in the research trial and received a polymer free BioFreedom stent.  His aspirin was subsequently to been stopped and Roberto Haley continues to be on Plavix 75 mg daily.  Roberto Haley denies any bleeding and seems to tolerate this well.  Since I last saw him, Roberto Haley has continued to do well, but Roberto Haley admits to experiencing occasional episodes of angina.  Most of the time these occur when Roberto Haley is tired.  Roberto Haley typically does not experience chest pain in the early morning walk, but seems to notice it more at the end of the day.  I have recommended slight titration of isosorbide from 60 mg in the morning and 30 mrem at night to 90 mg in the morning and 30 mg at night.  Roberto Haley is beta blocked on Toprol-XL 50 mg daily.  Last summer at cardiac rehabilitation rhythm strips confirm short brief episodes of PAF.   I am concerned that the patient had a significant head trauma  bleed in the pastand this would place him at high risk for concomitant anticoagulation therapy particularly with his need for antiplatelet therapy.  Light of these mild episodes of possible angina, I have recommended Roberto Haley undergo an exercise Myoview study for follow-up evaluation.  His last stent was placed in on 12/02/2015.  I have recommended that Roberto Haley defer the colonoscopy and endoscopy.  Procedure until May, such that Roberto Haley can complete 1 year of Plavix treatment prior to holding this for the elective GI procedure.  I will see the patient back in the office next month following his nuclear stress test and further recommendations will be made at that time.    Time spent: 25 minutes  Troy Sine, MD, [facility]  10/08/2016 5:36 PM

## 2016-10-09 ENCOUNTER — Telehealth: Payer: Self-pay | Admitting: *Deleted

## 2016-10-09 NOTE — Telephone Encounter (Signed)
Faxed surgical clearance to [facility] informing them patient cannot have EGD/COLON at this time.

## 2016-10-16 ENCOUNTER — Telehealth (HOSPITAL_COMMUNITY): Payer: Self-pay

## 2016-10-16 NOTE — Telephone Encounter (Signed)
Encounter complete. 

## 2016-10-18 ENCOUNTER — Ambulatory Visit (HOSPITAL_COMMUNITY)
Admission: RE | Admit: 2016-10-18 | Discharge: 2016-10-18 | Disposition: A | Discharge status: Home / Self Care | Payer: Medicare Other | Source: Ambulatory Visit | Attending: Cardiology | Admitting: Cardiology

## 2016-10-18 DIAGNOSIS — I480 Paroxysmal atrial fibrillation: Secondary | ICD-10-CM | POA: Insufficient documentation

## 2016-10-18 DIAGNOSIS — I2510 Atherosclerotic heart disease of native coronary artery without angina pectoris: Secondary | ICD-10-CM | POA: Insufficient documentation

## 2016-10-18 LAB — MYOCARDIAL PERFUSION IMAGING
CHL CUP NUCLEAR SDS: 4
CHL CUP NUCLEAR SRS: 5
LV dias vol: 119 mL (ref 62–150)
LVSYSVOL: 62 mL
Peak HR: 96 {beats}/min
Rest HR: 65 {beats}/min
SSS: 7
TID: 0.93

## 2016-10-18 IMAGING — NM NM MISC PROCEDURE
9 series · 54 of 54 positions shown · non-contrast
Comparison: none

[Series 1: wbr rest · 6.40mm/px · 6 of 64 frames shown]
[frame 6/64]
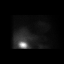
[frame 16/64]
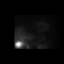
[frame 27/64]
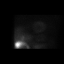
[frame 38/64]
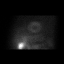
[frame 48/64]
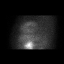
[frame 59/64]
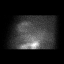

[Series 1: rest sax · 6.4mm · 6.40mm/px · 6 of 25 frames shown]
[frame 3/25]
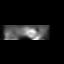
[frame 7/25]
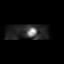
[frame 11/25]
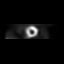
[frame 15/25]
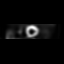
[frame 19/25]
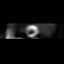
[frame 23/25]
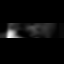

[Series 1: wbr_r-proj_st wbr rest · 6.40mm/px · 6 of 64 frames shown]
[frame 6/64]
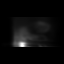
[frame 16/64]
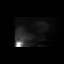
[frame 27/64]
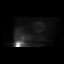
[frame 38/64]
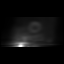
[frame 48/64]
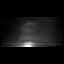
[frame 59/64]
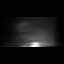

[Series 2: wbr stress-gsp · 6.40mm/px · 6 of 512 frames shown]
[frame 43/512]
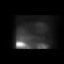
[frame 128/512]
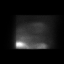
[frame 214/512]
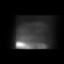
[frame 299/512]
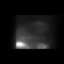
[frame 384/512]
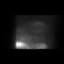
[frame 470/512]
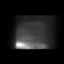

[Series 2: stress sax · 6.4mm · 6.40mm/px · 6 of 25 frames shown]
[frame 3/25]
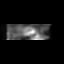
[frame 7/25]
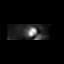
[frame 11/25]
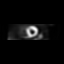
[frame 15/25]
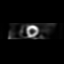
[frame 19/25]
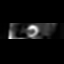
[frame 23/25]
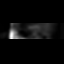

[Series 2: stress sax gs · 6.4mm · 6.40mm/px · 6 of 200 frames shown]
[frame 17/200]
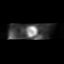
[frame 50/200]
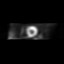
[frame 84/200]
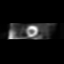
[frame 117/200]
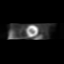
[frame 150/200]
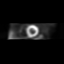
[frame 184/200]
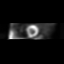

[Series 2: wbr_s-proj_st wbr stress-gsp · 6.40mm/px · 6 of 512 frames shown]
[frame 43/512]
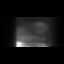
[frame 128/512]
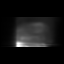
[frame 214/512]
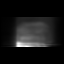
[frame 299/512]
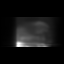
[frame 384/512]
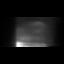
[frame 470/512]
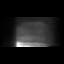

[Series 3: wbr stress-sum-em · 6.40mm/px · 6 of 64 frames shown]
[frame 6/64]
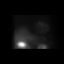
[frame 16/64]
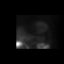
[frame 27/64]
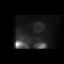
[frame 38/64]
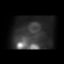
[frame 48/64]
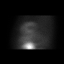
[frame 59/64]
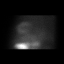

[Series 3: wbr_s-proj_st wbr stress-sum-em · 6.40mm/px · 6 of 64 frames shown]
[frame 6/64]
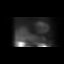
[frame 16/64]
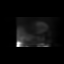
[frame 27/64]
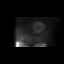
[frame 38/64]
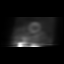
[frame 48/64]
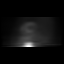
[frame 59/64]
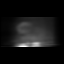

[54 of 54 positions shown; findings below may reference images not displayed]

Canned report from images found in remote index.

Refer to host system for actual result text.

## 2016-10-18 MED ORDER — TECHNETIUM TC 99M TETROFOSMIN IV KIT
10.4000 | PACK | Freq: Once | INTRAVENOUS | Status: AC | PRN
  Administered 2016-10-18: 10.4 via INTRAVENOUS
  Filled 2016-10-18: qty 11

## 2016-10-18 MED ORDER — REGADENOSON 0.4 MG/5ML IV SOLN
0.4000 mg | Freq: Once | INTRAVENOUS | Status: AC
  Administered 2016-10-18: 0.4 mg via INTRAVENOUS

## 2016-10-18 MED ORDER — TECHNETIUM TC 99M TETROFOSMIN IV KIT
31.6000 | PACK | Freq: Once | INTRAVENOUS | Status: AC | PRN
  Administered 2016-10-18: 31.6 via INTRAVENOUS
  Filled 2016-10-18: qty 32

## 2016-10-18 MED ORDER — AMINOPHYLLINE 25 MG/ML IV SOLN
75.0000 mg | Freq: Once | INTRAVENOUS | Status: AC
  Administered 2016-10-18: 75 mg via INTRAVENOUS

## 2016-10-25 ENCOUNTER — Telehealth: Payer: Self-pay | Admitting: Cardiovascular Disease

## 2016-10-25 NOTE — Telephone Encounter (Signed)
New message    Pt states he is returning call. He is not sure what it is about

## 2016-10-25 NOTE — Telephone Encounter (Signed)
Returned call-left message (ok per DPR).  Advised I am unable to locate who called or in regards to what.  Apologized and advised to call back with questions or concerns or if he has any more information from call.

## 2016-11-12 ENCOUNTER — Ambulatory Visit (INDEPENDENT_AMBULATORY_CARE_PROVIDER_SITE_OTHER): Payer: Medicare Other | Admitting: Cardiovascular Disease

## 2016-11-12 VITALS — BP 130/80 | HR 60 | Ht 68.0 in | Wt 207.2 lb

## 2016-11-12 DIAGNOSIS — I209 Angina pectoris, unspecified: Secondary | ICD-10-CM | POA: Diagnosis not present

## 2016-11-12 DIAGNOSIS — I10 Essential (primary) hypertension: Secondary | ICD-10-CM

## 2016-11-12 DIAGNOSIS — I25709 Atherosclerosis of coronary artery bypass graft(s), unspecified, with unspecified angina pectoris: Secondary | ICD-10-CM

## 2016-11-12 DIAGNOSIS — I2583 Coronary atherosclerosis due to lipid rich plaque: Secondary | ICD-10-CM | POA: Diagnosis not present

## 2016-11-12 DIAGNOSIS — E1142 Type 2 diabetes mellitus with diabetic polyneuropathy: Secondary | ICD-10-CM

## 2016-11-12 DIAGNOSIS — I2510 Atherosclerotic heart disease of native coronary artery without angina pectoris: Secondary | ICD-10-CM | POA: Diagnosis not present

## 2016-11-12 DIAGNOSIS — E785 Hyperlipidemia, unspecified: Secondary | ICD-10-CM | POA: Diagnosis not present

## 2016-11-12 NOTE — Patient Instructions (Signed)
Your physician wants you to follow-up in: 6 months or sooner if needed. You will receive a reminder letter in the mail two months in advance. If you don't receive a letter, please call our office to schedule the follow-up appointment.  You have ben cleared to have a colonoscopy, but not before May 2018.  If you need a refill on your cardiac medications before your next appointment, please call your pharmacy.

## 2016-11-12 NOTE — Progress Notes (Signed)
Patient ID: Roberto Haley, male   DOB: 04-May-1941, 76 y.o.   MRN: [id number]     Primary M.D.: Dr. Nelda Bucks  HPI: Roberto Haley is a 76 y.o. male who presents to the office for a one month cardiology followup evaluation.   Roberto Haley has known CAD and underwent CABG revascularization surgery in 1997 with LIMA to LAD, vein to the RCA. In March 1998 he underwent complex intervention to his native LAD and high speed rotational atherectomy (HSRA) and stenting of his LAD and PTCA of his diagonal vessel after he was found to have stenosis in the LIMA graft. In June 2013 due to 99% in-stent restenosis in the LAD stent and a new stenosis of 90% the distal circumflex, he underwent two-vessel intervention with insertion of a 2.75x22 mm resident stent in the LAD and PTCA of the distal circumflex via his previously placed stent which started in the mid left main coronary artery. A cardiopulmonary met test showed reduced maximum oxygen consumption. I  reduced his beta blocker therapy due to significant chronotropic incompetence and further titrated his Ranexa to 1000 mg twice a day.  A nuclear perfusion study in June 2014 showed distal anteroseptal scar without significant ischemia. He did have PACs and PVCs in gating was not done.  He  fell on the Fourth of July 2014 and landed on his head. He ultimately was transferred to [facility] where was hospitalized for a week. He did not have neurosurgery. He was told of having "blood on his brain."  He did have a left-sided per hemiparesis. He then spent 2 months at [facility] rehabilitation unit and has spent additional time a universal rehabilitation in [place] until October 31.  He is significant improved following his head trauma with return of his memory.  Last year, I reduced his beta blocker therapy due to bradycardia.  He underwent a nuclear perfusion study on 11/22/2015 which now showed an ejection fraction of 42% with inferolateral hypokinesis.   There was a new large defect in the inferolateral wall and there was no change in his previous anteroseptal defect.  There was concern for scar/ischemia inferolaterally which was new.   He underwent cardiac catheterization by me on 04/28/2017and was found to have significant native CAD with a patent stent extending from the mid left main into the proximal LAD, 50% narrowings in the LAD after the first diagonal vessel and widely patent mid LAD stent.  There was pain PTCA sites in the left circumflex proximally at the mid segment with 50% stenosis a small OM branch.  There was new total occlusion of his proximal native RCA with faint bridging collaterals and faint collateralization to the distal branch of the RCA which had not been supplied by the vein graft.  He had previously documented occluded LIMA graft.  The vein graft supplying the PDA and PLA vessel was patent but he had new distal disease with 95% stenosis in the mid PDA and very distal 75% stenosis.  He underwent successful intervention to his PDA and due to his prior history of head trauma and CNS bleed.  He underwent insertion of a research BioFreedom stent and after  30 days was transitioned to only single anti platelet therapy rather than DAPT.  Since undergoing his PCI to his mid PDA he has noted marked improvement in his ability to be active.  He continues to participate in cardiac rehabilitation and often walks 1 mile in the morning before breakfast and feels well.  He has noticed occasional angina episodes of angina, particularly when he is tired.    In June 2017 he had a brief episode of PAF, alt doing cardiac rehabilitation.  He states that he has not been sleeping very well.  He has not had any GI issues, but he has only had one colonoscopy in his life and he is 76 years old.  He is seen by Dr. Lyda Jester in [place] and was scheduled for screening colonoscopy and EGD.  When I last saw him, he had noticed some vague exertional chest  discomfort which did not occur during walks in the early morning, but he had noticed it more at the end of the day.  I further titrated his isosorbide and increase this to 90 mg in the morning and 30 mg at night.  He has continued Toprol-XL for beta blocker therapy.  I referred him for a nuclear perfusion study which was done on 10/18/2016.  This showed a calculated EF of 48% but visually it appeared to be 55%.  There was mild possible scar as well as a component of diaphragmatic attenuation but a small area of ischemia could not be completely excluded apically.  Roberto Haley denies any recurrent episodes of chest pain.  He is unaware of palpitations.  He presents for evaluation.  Past Medical History:  Diagnosis Date  . Arthritis   . Chronic kidney disease    CKD STAGE 3;  CHRONIC RENAL INSUFFICIENCY  . Coronary artery disease   . Diabetes mellitus   . Groin hematoma 02/19/12   RIGHT  . Hyperlipidemia   . Hypertension   . Neuropathy due to type 2 diabetes mellitus ([place])   . Obesity   . PVC (premature ventricular contraction)   . SDH (subdural hematoma) ([place]) 02/06/2013   R parafalcine SDH after a fall, rx at [facility]    Past Surgical History:  Procedure Laterality Date  . CARDIAC CATHETERIZATION  01/14/12   LV FXN EF 50-55% W/MILD DISTAL INFERIOR HYPOKINESIS; PCI: LAD PTCA/STENT W/NEW 2.75X22 RESOLUTE DES IN MID LAD POST DIALTED TO 3.08 TO 2.97 TAPER: 99%-80% TO 0; LCX: PTCA VIA LM STENT W/2.25X12 SPRINTER BALLOON: 90%-5; LAD: PATENT PROXIMAL STENT EXTENDING INTO LM W/30-40% SMOOTH NARROWING IN DISTAL PORTION OF STENT; 99% IN STENT RESTENOSIS OF MID LAD STENT   . CARDIAC CATHETERIZATION N/A 12/02/2015   Procedure: Left Heart Cath and Coronary Angiography;  Surgeon: Troy Sine, MD;  Location: [facility];  Service: Cardiovascular;  Laterality: N/A;  . CARDIAC CATHETERIZATION N/A 12/02/2015   Procedure: Coronary Stent Intervention;  Surgeon: Troy Sine, MD;  Location: [facility];  Service: Cardiovascular;  Laterality: N/A;  . CORONARY ARTERY BYPASS GRAFT  1997   LIMA to the LAD and vein to the RCA;  . CORONARY STENT PLACEMENT  12/02/2015   PAD  . DOPPLER ECHOCARDIOGRAPHY  01/09/12   LV NORMAL IN SIZE, NORMAL WAL THICKNESS, EF 50-55%; MILD POSTERIOR WALL HYOPKINESIS, THERE IS MILD INFERIOR WALL HYPOKINESIS  . HYPOTHENAR FAT PAD TRANSFER    . LEFT HEART CATHETERIZATION WITH CORONARY/GRAFT ANGIOGRAM N/A 01/14/2012   Procedure: LEFT HEART CATHETERIZATION WITH Beatrix Fetters;  Surgeon: Troy Sine, MD;  Location: [facility] CATH LAB;  Service: Cardiovascular;  Laterality: N/A;  . LOWER ARTERIAL DOPPLER  01/24/12   ESSENTIALLY NORMAL RIGHT GROIN DUPLEX EVALUATION S/P THROMBIN INJECTION  . LOWER VENOUS DUPLEX  02/05/12   ESSENTIALLY NORMAL RIGHT LOWER EXTRIMTY VENOUS DUPLEX DOPPLER EVALUATION  . NM MYOVIEW  LTD  01/09/12   HIGH RISK SCAN. COMPARED TO PREVIOUS STUDY, THERE IS NOW ISCHEMIA PRESENT. ABNORMAL MYOCARDIAL PERFUSION STUDY. THERE IS NEW MILD INFEROLATERAL ISCHEMIA TOWARDS THE BASE; PT TO FOLLOW UP WITH DR. [name]  . PERCUTANEOUS CORONARY STENT INTERVENTION (PCI-S) N/A 01/14/2012   Procedure: PERCUTANEOUS CORONARY STENT INTERVENTION (PCI-S);  Surgeon: Troy Sine, MD;  Location: [facility] CATH LAB;  Service: Cardiovascular;  Laterality: N/A;    Allergies  Allergen Reactions  . Diltiazem Other (See Comments)    Unknown  . Lovastatin Other (See Comments)    Unknown  . Proton Pump Inhibitors Other (See Comments)    Unknown   . Tramadol Nausea And Vomiting  . Codeine Rash    Current Outpatient Prescriptions  Medication Sig Dispense Refill  . amLODipine (NORVASC) 10 MG tablet Take 1 tablet (10 mg total) by mouth daily. 30 tablet 11  . atorvastatin (LIPITOR) 40 MG tablet Take 40 mg by mouth daily.   0  . benazepril (LOTENSIN) 40 MG tablet Take 40 mg by mouth daily.    . cholecalciferol (VITAMIN D) 1000 UNITS tablet Take 1,000 Units by mouth daily.    . clopidogrel  (PLAVIX) 75 MG tablet Take 1 tablet (75 mg total) by mouth daily with breakfast. 30 tablet 11  . insulin aspart (NOVOLOG) 100 UNIT/ML FlexPen Inject 12 Units into the skin 3 (three) times daily with meals.     . isosorbide mononitrate (IMDUR) 60 MG 24 hr tablet Take 1.5 tablets in the morning (87m) and 1/2 tablet at night (30 mg) 60 tablet 11  . LEVEMIR FLEXTOUCH 100 UNIT/ML Pen Inject 35 Units into the skin 2 (two) times daily.   12  . LINZESS 145 MCG CAPS capsule TK 1 C PO D  11  . metFORMIN (GLUCOPHAGE-XR) 750 MG 24 hr tablet Take 750 mg by mouth daily with breakfast.    . metoprolol succinate (TOPROL-XL) 100 MG 24 hr tablet Take 50 mg by mouth daily.    . Multiple Vitamin (MULTIVITAMIN WITH MINERALS) TABS Take 1 tablet by mouth daily.    . nitroGLYCERIN (NITROSTAT) 0.4 MG SL tablet Place 1 tablet (0.4 mg total) under the tongue every 5 (five) minutes as needed. For chest pain 25 tablet 12  . NOVOFINE AUTOCOVER 30G X 8 MM MISC     . ONE TOUCH ULTRA TEST test strip   4  . oxybutynin (DITROPAN) 5 MG tablet Take 1 tablet by mouth daily.    .Marland KitchenPREVNAR 13 SUSP injection   0  . ranitidine (ZANTAC) 150 MG tablet Take 150 mg by mouth 2 (two) times daily.    .Marland KitchenSYNTHROID 75 MCG tablet Take 1 tablet by mouth daily.     . TRULICITY 1.5 MSE/8.3TDSOPN Inject 1.5 mg as directed once a week.   12  . vitamin C (ASCORBIC ACID) 500 MG tablet Take 500 mg by mouth daily.     No current facility-administered medications for this visit.     Socially, he is married has one child 2 grandchildren and 2 great-grandchildren. There is no tobacco or alcohol use.  ROS General: Negative; No fevers, chills, or night sweats;  HEENT: Positive for cataracts; No changes in hearing, sinus congestion, difficulty swallowing Pulmonary: Negative; No cough, wheezing, shortness of breath, hemoptysis Cardiovascular: See history of present illness;  GI: Positive for GERD on ranitidine; No nausea, vomiting, diarrhea, or abdominal  pain GU: Negative; No dysuria, hematuria, or difficulty voiding Musculoskeletal: Negative; no myalgias, joint pain, or  weakness Hematologic/Oncology: Negative; no easy bruising, bleeding Endocrine: Positive for hypothyroidism, on Synthroid replacement; diabetes not well controlled Neuro: Negative; no changes in balance, headaches Skin: Negative; No rashes or skin lesions Psychiatric: Negative; No behavioral problems, depression Sleep: Negative; No snoring, daytime sleepiness, hypersomnolence, bruxism, restless legs, hypnogognic hallucinations, no cataplexy Other comprehensive 14 point system review is negative.  PE BP 130/80   Pulse 60   Ht _0  (1.727 m)   Wt 207 lb 3.2 oz (94 kg)   BMI 31.50 kg/m    Wt Readings from Last 3 Encounters:  11/12/16 207 lb 3.2 oz (94 kg)  10/18/16 206 lb (93.4 kg)  10/08/16 206 lb (93.4 kg)   General: Alert, oriented, no distress.  HEENT: Normocephalic, atraumatic. Pupils round and reactive; sclera anicteric; no lid lag. Atrophic muscles intact. No xanthelasmas. Nose without nasal septal hypertrophy Mouth/Parynx benign; Mallinpatti scale 3 Neck: No JVD, no carotid bruits with normal carotid upstroke Chest wall: Nontender to palpation Lungs: clear to ausculatation and percussion; no wheezing or rales Heart: RRR, s1 s2 normal 2/6 systolic murmur, unchanged, no S3, gallop; no diastolic murmur.  No rubs, thrills or heaves Abdomen: soft, nontender; no hepatosplenomehaly, BS+; abdominal aorta nontender and not dilated by palpation. Mild central adiposity Pulses 2+;  Extremities: Trivial residual edema; no clubbing cyanosis, Homan's sign negative  Neurologic: grossly nonfocal Psychologic: Normal affect and mood   ECG (independently read by me): Normal sinus rhythm at 61 bpm.  First-degree AV block with a PR interval 206 ms.  Q wave in 3 and aVF.  June 2017 ECG (independently read by me): Sinus bradycardia 58 bpm.  Inferior Q waves.  Normal intervals.   No ST segment changes.  May 2017 ECG (independently read by me): Sinus bradycardia 59 bpm.  LVH by voltage criteria.  September 2016 ECG (independently read by me):  Normal sinus rhythm at 65 bpm. Mild voltage for LVH.  01/13/2015 ECG (independently read by me): Sinus bradycardia 58 bpm.  Normal intervals.  October 2015 ECG (independent read by me): Sinus bradycardia at 50 bpm.  No ectopy.  PR interval 196 msec,   Borderline LVH.  Prior June 2015 ECG (independently read by me): Sinus bradycardia 52 beats per minute.  Borderline first degree AV block with a PR interval of 200 ms.  December 2014 ECG (independently read by me): Sinus rhythm at 54 beats per minute. No ectopy. Normal intervals.  ECG from 07/08/2013 : reveals sinus rhythm at 57 beats per minute. QTc interval 453 ms.  LABS: BMP Latest Ref Rng & Units 12/03/2015 11/25/2015 11/01/2014  Glucose 65 - 99 mg/dL 110(H) 174(H) -  BUN 6 - 20 mg/dL 16 20 -  Creatinine 0.61 - 1.24 mg/dL 1.22 1.46(H) 1.6(A)  Sodium 135 - 145 mmol/L 141 140 -  Potassium 3.5 - 5.1 mmol/L 3.3(L) 4.3 -  Chloride 101 - 111 mmol/L 108 103 -  CO2 22 - 32 mmol/L 22 29 -  Calcium 8.9 - 10.3 mg/dL 8.7(L) 9.1 -   Hepatic Function Latest Ref Rng & Units 11/25/2015  Total Protein 6.1 - 8.1 g/dL 7.1  Albumin 3.6 - 5.1 g/dL 4.3  AST 10 - 35 U/L 15  ALT 9 - 46 U/L 16  Alk Phosphatase 40 - 115 U/L 58  Total Bilirubin 0.2 - 1.2 mg/dL 0.6   CBC Latest Ref Rng & Units 12/03/2015 11/25/2015 01/19/2012  WBC 4.0 - 10.5 K/uL 9.3 8.6 9.1  Hemoglobin 13.0 - 17.0 g/dL 12.2(L) 14.2 10.1(L)  Hematocrit 39.0 - 52.0 % 36.9(L) 41.9 30.1(L)  Platelets 150 - 400 K/uL 187 239 190   Lab Results  Component Value Date   MCV 90.0 12/03/2015   MCV 89.9 11/25/2015   MCV 88.3 01/19/2012   No results found for: TSH   Lab Results  Component Value Date   HGBA1C 10.7 (A) 11/01/2014   Lipid Panel      Component Value Date/Time   CHOL 138 11/01/2014   HDL 39 11/01/2014     RADIOLOGY: No results found.  IMPRESSION:  1. Coronary artery disease due to lipid rich plaque   2. Coronary artery disease involving coronary bypass graft of native heart with angina pectoris ([place])   3. Essential hypertension   4. Hyperlipidemia LDL goal <70   5. DM type 2 with diabetic peripheral neuropathy [facility])     ASSESSMENT AND PLAN:  Roberto. Fischel is a 76 year old white male who is status post CABG revascularization surgery in 1997 and has a documented occluded LIMA. He has previously undergone intervention both to his proximal LAD extending into the left main with stenting and stenting of his mid LAD. In June 2013 he developed severe in-stent restenosis of his LAD stent and a DES stent was in place at that time. We also went through the stent struts in the left main and performed PTCA of the distal circumflex. He had mild/moderate disease in his PDA after his SVG to his PDA insertion. A previous nuclear perfusion study  in June 2014 after experiencing some vague episodes of chest pain was low risk and showed distal anteroseptal scar with the extent of 10% without associated ischemia.  His nuclear study in April 2017 revealed a large new defect inferolaterally with scar/ischemia that was not present in 2014.  He underwent cardiac catheterization with findings as noted above.  His ischemia was secondary to new high-grade stenosis in the mid PDA and distal PDA for which he underwent successful stenting of the 95% stenosis which was reduced to 0% and PTCA of the very distal 75% stenosis reduced to 0%.  He was enrolled in the research trial and received a polymer free BioFreedom stent.  His aspirin was subsequently to been stopped and he continues to be on Plavix 75 mg daily.  He has not had any bleeding or GI issues.  When I last saw him, he had noticed some mild episodes of chest discomfort.  With medication titration.  This has completely resolved.  I reviewed his nuclear perfusion study with  him in detail which shows a small region of scar in the apical region as well as there were areas of attenuation artifact from his diaphragm.  A small zone of possible ischemia could not be excluded apically.  I believe his study remains in the low to intermediate risk category and visually his EF appeared 55%, although quantitatively calculated to 48%.  I feel it is safe for him to undergo screening colonoscopy.  He would need to hold his Plavix for 5 days prior to procedure and should reinstitute this when okay from the GI perspective, depending upon whether or not polyps are found were removed.  He will continue his increased antianginal regimen.  His blood pressure today remains stable on amlodipine 10 mg, but hospital 40 mg, isosorbide 90 no grams in the morning, 30 mg at night, in addition to his Toprol-XL 50 mg daily.  He is diabetic on Levemir and NovoLog in addition to metformin.  He is tolerating atorvastatin for  hyperlipidemia with target LDL less than 70.  I will see him in 6 months for reevaluation.  Time spent: 25 minutes  Troy Sine, MD, [facility]  11/15/2016 1:52 PM

## 2016-11-13 ENCOUNTER — Encounter: Payer: Self-pay | Admitting: *Deleted

## 2016-11-13 ENCOUNTER — Encounter: Payer: Self-pay | Admitting: Cardiovascular Disease

## 2016-11-13 DIAGNOSIS — Z006 Encounter for examination for normal comparison and control in clinical research program: Secondary | ICD-10-CM

## 2016-11-13 NOTE — Progress Notes (Signed)
LEADERS FREE II Research study month 12 follow up visit completed. EKG Obtained. Denies angina. Next research required follow up visit is due in one year.

## 2016-11-15 ENCOUNTER — Encounter: Payer: Self-pay | Admitting: Cardiovascular Disease

## 2016-11-22 DIAGNOSIS — E785 Hyperlipidemia, unspecified: Secondary | ICD-10-CM | POA: Diagnosis not present

## 2016-11-22 DIAGNOSIS — Z1389 Encounter for screening for other disorder: Secondary | ICD-10-CM | POA: Diagnosis not present

## 2016-11-22 DIAGNOSIS — E11319 Type 2 diabetes mellitus with unspecified diabetic retinopathy without macular edema: Secondary | ICD-10-CM | POA: Diagnosis not present

## 2016-11-22 DIAGNOSIS — E669 Obesity, unspecified: Secondary | ICD-10-CM | POA: Diagnosis not present

## 2016-11-22 DIAGNOSIS — Z0000 Encounter for general adult medical examination without abnormal findings: Secondary | ICD-10-CM | POA: Diagnosis not present

## 2016-11-22 DIAGNOSIS — Z136 Encounter for screening for cardiovascular disorders: Secondary | ICD-10-CM | POA: Diagnosis not present

## 2016-11-22 DIAGNOSIS — Z125 Encounter for screening for malignant neoplasm of prostate: Secondary | ICD-10-CM | POA: Diagnosis not present

## 2016-11-22 DIAGNOSIS — Z6830 Body mass index (BMI) 30.0-30.9, adult: Secondary | ICD-10-CM | POA: Diagnosis not present

## 2016-11-22 DIAGNOSIS — Z1211 Encounter for screening for malignant neoplasm of colon: Secondary | ICD-10-CM | POA: Diagnosis not present

## 2016-11-22 DIAGNOSIS — Z9181 History of falling: Secondary | ICD-10-CM | POA: Diagnosis not present

## 2016-11-28 DIAGNOSIS — Z1211 Encounter for screening for malignant neoplasm of colon: Secondary | ICD-10-CM | POA: Diagnosis not present

## 2016-11-28 DIAGNOSIS — Z1212 Encounter for screening for malignant neoplasm of rectum: Secondary | ICD-10-CM | POA: Diagnosis not present

## 2016-12-03 ENCOUNTER — Telehealth: Payer: Self-pay | Admitting: Cardiovascular Disease

## 2016-12-03 ENCOUNTER — Other Ambulatory Visit: Payer: Self-pay | Admitting: *Deleted

## 2016-12-03 MED ORDER — CLOPIDOGREL BISULFATE 75 MG PO TABS: 75.0000 mg | ORAL_TABLET | Freq: Every day | ORAL | 11 refills | Status: DC

## 2016-12-03 NOTE — Telephone Encounter (Signed)
New message      *STAT* If patient is at the pharmacy, call can be transferred to refill team.   1. Which medications need to be refilled? (please list name of each medication and dose if known)  clopidogrel (PLAVIX) 75 MG tablet Take 1 tablet (75 mg total) by mouth daily with breakfast.     2. Which pharmacy/location (including street and city if local pharmacy) is medication to be sent to?[facility] [place]  3. Do they need a 30 day or 90 day supply? [place]

## 2016-12-03 NOTE — Telephone Encounter (Signed)
Rx has been sent to the pharmacy electronically. ° °

## 2016-12-04 NOTE — Telephone Encounter (Signed)
Medication Detail    Disp Refills Start End   clopidogrel (PLAVIX) 75 MG tablet 30 tablet 11 12/03/2016    Sig - Route: Take 1 tablet (75 mg total) by mouth daily with breakfast. - Oral   E-Prescribing Status: Receipt confirmed by pharmacy (12/03/2016 4:35 PM EDT)   [place] [id number] - [place], [place] - [address] AT [place]

## 2016-12-07 DIAGNOSIS — E785 Hyperlipidemia, unspecified: Secondary | ICD-10-CM | POA: Diagnosis not present

## 2016-12-07 DIAGNOSIS — E039 Hypothyroidism, unspecified: Secondary | ICD-10-CM | POA: Diagnosis not present

## 2016-12-07 DIAGNOSIS — E1129 Type 2 diabetes mellitus with other diabetic kidney complication: Secondary | ICD-10-CM | POA: Diagnosis not present

## 2016-12-07 DIAGNOSIS — I129 Hypertensive chronic kidney disease with stage 1 through stage 4 chronic kidney disease, or unspecified chronic kidney disease: Secondary | ICD-10-CM | POA: Diagnosis not present

## 2016-12-13 DIAGNOSIS — E113393 Type 2 diabetes mellitus with moderate nonproliferative diabetic retinopathy without macular edema, bilateral: Secondary | ICD-10-CM | POA: Diagnosis not present

## 2017-01-08 ENCOUNTER — Other Ambulatory Visit: Payer: Self-pay | Admitting: Cardiovascular Disease

## 2017-02-07 ENCOUNTER — Telehealth: Payer: Self-pay | Admitting: Cardiovascular Disease

## 2017-02-07 NOTE — Telephone Encounter (Signed)
Returned call, pt aware usually cataract procedures are routine and may not require stopping medications. He'll give our fax number to his eye surgeon so that if a clearance is needed, they can send the request to [place]. Aware to call if other needs or concerns.

## 2017-02-07 NOTE — Telephone Encounter (Signed)
New message    Pt is calling because he is thinking about Cataract removal for right eye and he wants to know how safe would this be with his heart condition. He states that this is not surgery clearance of any kind he just would like to speak with a nurse about any concerns the nurse or Dr. Claiborne Billings might have.

## 2017-02-14 DIAGNOSIS — E113393 Type 2 diabetes mellitus with moderate nonproliferative diabetic retinopathy without macular edema, bilateral: Secondary | ICD-10-CM | POA: Diagnosis not present

## 2017-03-08 DIAGNOSIS — H25811 Combined forms of age-related cataract, right eye: Secondary | ICD-10-CM | POA: Diagnosis not present

## 2017-03-11 DIAGNOSIS — E039 Hypothyroidism, unspecified: Secondary | ICD-10-CM | POA: Diagnosis not present

## 2017-03-11 DIAGNOSIS — I129 Hypertensive chronic kidney disease with stage 1 through stage 4 chronic kidney disease, or unspecified chronic kidney disease: Secondary | ICD-10-CM | POA: Diagnosis not present

## 2017-03-11 DIAGNOSIS — E1165 Type 2 diabetes mellitus with hyperglycemia: Secondary | ICD-10-CM | POA: Diagnosis not present

## 2017-03-11 DIAGNOSIS — E1129 Type 2 diabetes mellitus with other diabetic kidney complication: Secondary | ICD-10-CM | POA: Diagnosis not present

## 2017-03-11 DIAGNOSIS — E785 Hyperlipidemia, unspecified: Secondary | ICD-10-CM | POA: Diagnosis not present

## 2017-03-11 DIAGNOSIS — Z6830 Body mass index (BMI) 30.0-30.9, adult: Secondary | ICD-10-CM | POA: Diagnosis not present

## 2017-04-05 DIAGNOSIS — L237 Allergic contact dermatitis due to plants, except food: Secondary | ICD-10-CM | POA: Diagnosis not present

## 2017-05-20 DIAGNOSIS — E782 Mixed hyperlipidemia: Secondary | ICD-10-CM | POA: Diagnosis not present

## 2017-05-20 DIAGNOSIS — E1165 Type 2 diabetes mellitus with hyperglycemia: Secondary | ICD-10-CM | POA: Diagnosis not present

## 2017-05-21 DIAGNOSIS — N183 Chronic kidney disease, stage 3 unspecified: Secondary | ICD-10-CM | POA: Insufficient documentation

## 2017-05-21 DIAGNOSIS — R809 Proteinuria, unspecified: Secondary | ICD-10-CM | POA: Diagnosis not present

## 2017-05-21 DIAGNOSIS — E1165 Type 2 diabetes mellitus with hyperglycemia: Secondary | ICD-10-CM | POA: Diagnosis not present

## 2017-05-21 DIAGNOSIS — E11319 Type 2 diabetes mellitus with unspecified diabetic retinopathy without macular edema: Secondary | ICD-10-CM | POA: Diagnosis not present

## 2017-05-21 DIAGNOSIS — E1121 Type 2 diabetes mellitus with diabetic nephropathy: Secondary | ICD-10-CM | POA: Diagnosis not present

## 2017-05-21 DIAGNOSIS — I129 Hypertensive chronic kidney disease with stage 1 through stage 4 chronic kidney disease, or unspecified chronic kidney disease: Secondary | ICD-10-CM | POA: Diagnosis not present

## 2017-05-21 DIAGNOSIS — E782 Mixed hyperlipidemia: Secondary | ICD-10-CM | POA: Diagnosis not present

## 2017-05-21 DIAGNOSIS — I2510 Atherosclerotic heart disease of native coronary artery without angina pectoris: Secondary | ICD-10-CM | POA: Diagnosis not present

## 2017-05-21 DIAGNOSIS — Z794 Long term (current) use of insulin: Secondary | ICD-10-CM | POA: Diagnosis not present

## 2017-05-28 ENCOUNTER — Encounter: Payer: Self-pay | Admitting: Cardiovascular Disease

## 2017-05-28 ENCOUNTER — Ambulatory Visit (INDEPENDENT_AMBULATORY_CARE_PROVIDER_SITE_OTHER): Payer: Medicare Other | Admitting: Cardiovascular Disease

## 2017-05-28 VITALS — BP 114/70 | HR 57 | Ht 68.0 in | Wt 196.0 lb

## 2017-05-28 DIAGNOSIS — I480 Paroxysmal atrial fibrillation: Secondary | ICD-10-CM

## 2017-05-28 DIAGNOSIS — I10 Essential (primary) hypertension: Secondary | ICD-10-CM

## 2017-05-28 DIAGNOSIS — Z79899 Other long term (current) drug therapy: Secondary | ICD-10-CM | POA: Diagnosis not present

## 2017-05-28 DIAGNOSIS — N183 Chronic kidney disease, stage 3 unspecified: Secondary | ICD-10-CM

## 2017-05-28 DIAGNOSIS — E785 Hyperlipidemia, unspecified: Secondary | ICD-10-CM

## 2017-05-28 DIAGNOSIS — I2510 Atherosclerotic heart disease of native coronary artery without angina pectoris: Secondary | ICD-10-CM | POA: Diagnosis not present

## 2017-05-28 MED ORDER — BENAZEPRIL HCL 20 MG PO TABS: 20.0000 mg | ORAL_TABLET | Freq: Every day | ORAL | 2 refills | Status: DC

## 2017-05-28 NOTE — Patient Instructions (Signed)
Medication Instructions:  DECREASE- Benazepril 20 mg daily  If you need a refill on your cardiac medications before your next appointment, please call your pharmacy.  Labwork: BMP in 1 Month HERE IN OUR OFFICE AT [facility]  Testing/Procedures: None Ordered  Follow-Up: Your physician wants you to follow-up in: 6 Months. You should receive a reminder letter in the mail two months in advance. If you do not receive a letter, please call our office [phone].   Thank you for choosing [facility]!!

## 2017-05-28 NOTE — Progress Notes (Signed)
Patient ID: Roberto Haley, male   DOB: 1940/11/14, 76 y.o.   MRN: [id number]     Primary M.D.: Dr. Nelda Bucks  HPI: CLEMENCE Haley is a 76 y.o. male who presents to the office for a 6 month cardiology followup evaluation.   Mr Roberto Haley has known CAD and underwent CABG revascularization surgery in 1997 with LIMA to LAD, vein to the RCA. In March 1998 he underwent complex intervention to his native LAD and high speed rotational atherectomy (HSRA) and stenting of his LAD and PTCA of his diagonal vessel after he was found to have stenosis in the LIMA graft. In June 2013 due to 99% in-stent restenosis in the LAD stent and a new stenosis of 90% the distal circumflex, he underwent two-vessel intervention with insertion of a 2.75x22 mm resident stent in the LAD and PTCA of the distal circumflex via his previously placed stent which started in the mid left main coronary artery. A cardiopulmonary met test showed reduced maximum oxygen consumption. I  reduced his beta blocker therapy due to significant chronotropic incompetence and further titrated his Ranexa to 1000 mg twice a day.  A nuclear perfusion study in June 2014 showed distal anteroseptal scar without significant ischemia. He did have PACs and PVCs in gating was not done.  He  fell on the Fourth of July 2014 and landed on his head. He ultimately was transferred to [facility] where was hospitalized for a week. He did not have neurosurgery. He was told of having "blood on his brain."  He did have a left-sided per hemiparesis. He then spent 2 months at [facility] rehabilitation unit and has spent additional time a universal rehabilitation in [place] until October 31.  He is significant improved following his head trauma with return of his memory.  Last year, I reduced his beta blocker therapy due to bradycardia.  He underwent a nuclear perfusion study on 11/22/2015 which now showed an ejection fraction of 42% with inferolateral hypokinesis.   There was a new large defect in the inferolateral wall and there was no change in his previous anteroseptal defect.  There was concern for scar/ischemia inferolaterally which was new.   He underwent cardiac catheterization by me on 04/28/2017and was found to have significant native CAD with a patent stent extending from the mid left main into the proximal LAD, 50% narrowings in the LAD after the first diagonal vessel and widely patent mid LAD stent.  There was pain PTCA sites in the left circumflex proximally at the mid segment with 50% stenosis a small OM branch.  There was new total occlusion of his proximal native RCA with faint bridging collaterals and faint collateralization to the distal branch of the RCA which had not been supplied by the vein graft.  He had previously documented occluded LIMA graft.  The vein graft supplying the PDA and PLA vessel was patent but he had new distal disease with 95% stenosis in the mid PDA and very distal 75% stenosis.  He underwent successful intervention to his PDA and due to his prior history of head trauma and CNS bleed.  He underwent insertion of a research BioFreedom stent and after  30 days was transitioned to only single anti platelet therapy rather than DAPT.  Since undergoing his PCI to his mid PDA he has noted marked improvement in his ability to be active.  He continues to participate in cardiac rehabilitation and often walks 1 mile in the morning before breakfast and feels well.  He has noticed occasional angina episodes of angina, particularly when he is tired.    In June 2017 he had a brief episode of PAF, alt doing cardiac rehabilitation.  He states that he has not been sleeping very well.  He has not had any GI issues, but he has only had one colonoscopy in his life and he is 76 years old.  He is seen by Dr. Lyda Jester in [place] and was scheduled for screening colonoscopy and EGD.  When I last saw him, he had noticed some vague exertional chest  discomfort which did not occur during walks in the early morning, but he had noticed it more at the end of the day.  I further titrated his isosorbide and increase this to 90 mg in the morning and 30 mg at night.  He has continued Toprol-XL for beta blocker therapy.  I referred him for a nuclear perfusion study which was done on 10/18/2016.  This showed a calculated EF of 48% but visually it appeared to be 55%.  There was mild possible scar as well as a component of diaphragmatic attenuation but a small area of ischemia could not be completely excluded apically.   Since I last saw him in April 2018.  He has continued to do well.  He is walking 1-1/2-2 miles per day in the morning and 3 days per week also goes to cardiac rehabilitation.  He denies chest pain, PND, orthopnea.  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  He recently had laboratory by Dr. Eden Emms.  Glucose was 163.  Creatinine had increased to 1.61.  Lipids were excellent with a total cholesterol 107, triglycerides 71, HDL 41, and LDL 52.  Hemoglobin A1c was 8.0.  He presents for reevaluation.  Past Medical History:  Diagnosis Date  . Arthritis   . Chronic kidney disease    CKD STAGE 3;  CHRONIC RENAL INSUFFICIENCY  . Coronary artery disease   . Diabetes mellitus   . Groin hematoma 02/19/12   RIGHT  . Hyperlipidemia   . Hypertension   . Neuropathy due to type 2 diabetes mellitus ([place])   . Obesity   . PVC (premature ventricular contraction)   . SDH (subdural hematoma) ([place]) 02/06/2013   R parafalcine SDH after a fall, rx at [facility]    Past Surgical History:  Procedure Laterality Date  . CARDIAC CATHETERIZATION  01/14/12   LV FXN EF 50-55% W/MILD DISTAL INFERIOR HYPOKINESIS; PCI: LAD PTCA/STENT W/NEW 2.75X22 RESOLUTE DES IN MID LAD POST DIALTED TO 3.08 TO 2.97 TAPER: 99%-80% TO 0; LCX: PTCA VIA LM STENT W/2.25X12 SPRINTER BALLOON: 90%-5; LAD: PATENT PROXIMAL STENT EXTENDING INTO LM W/30-40% SMOOTH NARROWING IN DISTAL PORTION OF  STENT; 99% IN STENT RESTENOSIS OF MID LAD STENT   . CARDIAC CATHETERIZATION N/A 12/02/2015   Procedure: Left Heart Cath and Coronary Angiography;  Surgeon: Troy Sine, MD;  Location: [place] CV [facility];  Service: Cardiovascular;  Laterality: N/A;  . CARDIAC CATHETERIZATION N/A 12/02/2015   Procedure: Coronary Stent Intervention;  Surgeon: Troy Sine, MD;  Location: [place] CV [facility];  Service: Cardiovascular;  Laterality: N/A;  . CORONARY ARTERY BYPASS GRAFT  1997   LIMA to the LAD and vein to the RCA;  . CORONARY STENT PLACEMENT  12/02/2015   PAD  . DOPPLER ECHOCARDIOGRAPHY  01/09/12   LV NORMAL IN SIZE, NORMAL WAL THICKNESS, EF 50-55%; MILD POSTERIOR WALL HYOPKINESIS, THERE IS MILD INFERIOR WALL HYPOKINESIS  . HYPOTHENAR FAT PAD TRANSFER    .  LEFT HEART CATHETERIZATION WITH CORONARY/GRAFT ANGIOGRAM N/A 01/14/2012   Procedure: LEFT HEART CATHETERIZATION WITH Beatrix Fetters;  Surgeon: Troy Sine, MD;  Location: [facility] CATH LAB;  Service: Cardiovascular;  Laterality: N/A;  . LOWER ARTERIAL DOPPLER  01/24/12   ESSENTIALLY NORMAL RIGHT GROIN DUPLEX EVALUATION S/P THROMBIN INJECTION  . LOWER VENOUS DUPLEX  02/05/12   ESSENTIALLY NORMAL RIGHT LOWER EXTRIMTY VENOUS DUPLEX DOPPLER EVALUATION  . NM MYOVIEW LTD  01/09/12   HIGH RISK SCAN. COMPARED TO PREVIOUS STUDY, THERE IS NOW ISCHEMIA PRESENT. ABNORMAL MYOCARDIAL PERFUSION STUDY. THERE IS NEW MILD INFEROLATERAL ISCHEMIA TOWARDS THE BASE; PT TO FOLLOW UP WITH DR. [name]  . PERCUTANEOUS CORONARY STENT INTERVENTION (PCI-S) N/A 01/14/2012   Procedure: PERCUTANEOUS CORONARY STENT INTERVENTION (PCI-S);  Surgeon: Troy Sine, MD;  Location: [facility] CATH LAB;  Service: Cardiovascular;  Laterality: N/A;    Allergies  Allergen Reactions  . Diltiazem Other (See Comments)    Unknown  . Lovastatin Other (See Comments)    Unknown  . Proton Pump Inhibitors Other (See Comments)    Unknown   . Tramadol Nausea And Vomiting  . Codeine Rash    Current  Outpatient Prescriptions  Medication Sig Dispense Refill  . amLODipine (NORVASC) 10 MG tablet TAKE 1 TABLET(10 MG) BY MOUTH DAILY 90 tablet 3  . atorvastatin (LIPITOR) 40 MG tablet Take 40 mg by mouth daily.   0  . cholecalciferol (VITAMIN D) 1000 UNITS tablet Take 1,000 Units by mouth daily.    . clopidogrel (PLAVIX) 75 MG tablet Take 1 tablet (75 mg total) by mouth daily with breakfast. 30 tablet 11  . insulin aspart (NOVOLOG) 100 UNIT/ML FlexPen Inject 12 Units into the skin 2 (two) times daily with a meal.     . isosorbide mononitrate (IMDUR) 60 MG 24 hr tablet Take 1.5 tablets in the morning (40m) and 1/2 tablet at night (30 mg) 60 tablet 11  . LEVEMIR FLEXTOUCH 100 UNIT/ML Pen Inject 44 Units into the skin 2 (two) times daily.   12  . LINZESS 145 MCG CAPS capsule TK 1 C PO D  11  . metFORMIN (GLUCOPHAGE-XR) 750 MG 24 hr tablet Take 750 mg by mouth daily with breakfast.    . metoprolol succinate (TOPROL-XL) 100 MG 24 hr tablet Take 50 mg by mouth daily.    . Multiple Vitamin (MULTIVITAMIN WITH MINERALS) TABS Take 1 tablet by mouth daily.    . nitroGLYCERIN (NITROSTAT) 0.4 MG SL tablet Place 1 tablet (0.4 mg total) under the tongue every 5 (five) minutes as needed. For chest pain 25 tablet 12  . NOVOFINE AUTOCOVER 30G X 8 MM MISC     . ONE TOUCH ULTRA TEST test strip   4  . oxybutynin (DITROPAN) 5 MG tablet Take 1 tablet by mouth daily.    .Marland KitchenPREVNAR 13 SUSP injection   0  . ranitidine (ZANTAC) 150 MG tablet Take 150 mg by mouth 2 (two) times daily.    .Marland KitchenSYNTHROID 75 MCG tablet Take 1 tablet by mouth daily.     . TRULICITY 1.5 MFF/6.3WGSOPN Inject 1.5 mg as directed once a week.   12  . vitamin C (ASCORBIC ACID) 500 MG tablet Take 500 mg by mouth daily.    . benazepril (LOTENSIN) 20 MG tablet Take 1 tablet (20 mg total) by mouth daily. 90 tablet 2   No current facility-administered medications for this visit.     Socially, he is married has one child 2 grandchildren and  2  great-grandchildren. There is no tobacco or alcohol use.  ROS General: Negative; No fevers, chills, or night sweats;  HEENT: Positive for cataracts; No changes in hearing, sinus congestion, difficulty swallowing Pulmonary: Negative; No cough, wheezing, shortness of breath, hemoptysis Cardiovascular: See history of present illness;  GI: Positive for GERD on ranitidine; No nausea, vomiting, diarrhea, or abdominal pain GU: Negative; No dysuria, hematuria, or difficulty voiding Musculoskeletal: Negative; no myalgias, joint pain, or weakness Hematologic/Oncology: Negative; no easy bruising, bleeding Endocrine: Positive for hypothyroidism, on Synthroid replacement; diabetes not well controlled Neuro: Negative; no changes in balance, headaches Skin: Negative; No rashes or skin lesions Psychiatric: Negative; No behavioral problems, depression Sleep: Negative; No snoring, daytime sleepiness, hypersomnolence, bruxism, restless legs, hypnogognic hallucinations, no cataplexy Other comprehensive 14 point system review is negative.  PE BP 114/70   Pulse (!) 57   Ht '5\' 8"'  (1.727 m)   Wt 196 lb (88.9 kg)   BMI 29.80 kg/m    Wt Readings from Last 3 Encounters:  05/28/17 196 lb (88.9 kg)  11/12/16 207 lb 3.2 oz (94 kg)  10/18/16 206 lb (93.4 kg)   General: Alert, oriented, no distress.  Skin: normal turgor, no rashes, warm and dry HEENT: Normocephalic, atraumatic. Pupils equal round and reactive to light; sclera anicteric; extraocular muscles intact; Nose without nasal septal hypertrophy Mouth/Parynx benign; Mallinpatti scale 3 Neck: No JVD, no carotid bruits; normal carotid upstroke Lungs: clear to ausculatation and percussion; no wheezing or rales Chest wall: without tenderness to palpitation Heart: PMI not displaced, RRR, s1 s2 normal, 1/6 systolic murmur, no diastolic murmur, no rubs, gallops, thrills, or heaves Abdomen: soft, nontender; no hepatosplenomehaly, BS+; abdominal aorta nontender  and not dilated by palpation. Back: no CVA tenderness Pulses 2+ Musculoskeletal: full range of motion, normal strength, no joint deformities Extremities: no clubbing cyanosis or edema, Homan's sign negative  Neurologic: grossly nonfocal; Cranial nerves grossly wnl Psychologic: Normal mood and affect  ECG (independently read by me): Sinus bradycardia 57 bpm.  Q wave in lead 3.  Nondiagnostic and aVF.  April 2018 ECG (independently read by me): Normal sinus rhythm at 61 bpm.  First-degree AV block with a PR interval 206 ms.  Q wave in 3 and aVF.  June 2017 ECG (independently read by me): Sinus bradycardia 58 bpm.  Inferior Q waves.  Normal intervals.  No ST segment changes.  May 2017 ECG (independently read by me): Sinus bradycardia 59 bpm.  LVH by voltage criteria.  September 2016 ECG (independently read by me):  Normal sinus rhythm at 65 bpm. Mild voltage for LVH.  01/13/2015 ECG (independently read by me): Sinus bradycardia 58 bpm.  Normal intervals.  October 2015 ECG (independent read by me): Sinus bradycardia at 50 bpm.  No ectopy.  PR interval 196 msec,   Borderline LVH.  Prior June 2015 ECG (independently read by me): Sinus bradycardia 52 beats per minute.  Borderline first degree AV block with a PR interval of 200 ms.  December 2014 ECG (independently read by me): Sinus rhythm at 54 beats per minute. No ectopy. Normal intervals.  ECG from 07/08/2013 : reveals sinus rhythm at 57 beats per minute. QTc interval 453 ms.  LABS: BMP Latest Ref Rng & Units 12/03/2015 11/25/2015 11/01/2014  Glucose 65 - 99 mg/dL 110(H) 174(H) -  BUN 6 - 20 mg/dL 16 20 -  Creatinine 0.61 - 1.24 mg/dL 1.22 1.46(H) 1.6(A)  Sodium 135 - 145 mmol/L 141 140 -  Potassium 3.5 - 5.1 mmol/L  3.3(L) 4.3 -  Chloride 101 - 111 mmol/L 108 103 -  CO2 22 - 32 mmol/L 22 29 -  Calcium 8.9 - 10.3 mg/dL 8.7(L) 9.1 -   Hepatic Function Latest Ref Rng & Units 11/25/2015  Total Protein 6.1 - 8.1 g/dL 7.1  Albumin 3.6 -  5.1 g/dL 4.3  AST 10 - 35 U/L 15  ALT 9 - 46 U/L 16  Alk Phosphatase 40 - 115 U/L 58  Total Bilirubin 0.2 - 1.2 mg/dL 0.6   CBC Latest Ref Rng & Units 12/03/2015 11/25/2015 01/19/2012  WBC 4.0 - 10.5 K/uL 9.3 8.6 9.1  Hemoglobin 13.0 - 17.0 g/dL 12.2(L) 14.2 10.1(L)  Hematocrit 39.0 - 52.0 % 36.9(L) 41.9 30.1(L)  Platelets 150 - 400 K/uL 187 239 190   Lab Results  Component Value Date   MCV 90.0 12/03/2015   MCV 89.9 11/25/2015   MCV 88.3 01/19/2012   No results found for: TSH   Lab Results  Component Value Date   HGBA1C 10.7 (A) 11/01/2014   Lipid Panel      Component Value Date/Time   CHOL 138 11/01/2014   HDL 39 11/01/2014    RADIOLOGY: No results found.  IMPRESSION:  1. Medication management   2. PAF (paroxysmal atrial fibrillation) ([place])   3. CAD in native artery   4. Essential hypertension   5. Hyperlipidemia LDL goal <70   6. Stage 3 chronic kidney disease (HCC)     ASSESSMENT AND PLAN:  Mr. Keir is a 76 year old white male who is status post CABG revascularization surgery in 1997 and has a documented occluded LIMA. He has previously undergone intervention both to his proximal LAD extending into the left main with stenting and stenting of his mid LAD. In June 2013 he developed severe in-stent restenosis of his LAD stent and a DES stent was in place at that time. We also went through the stent struts in the left main and performed PTCA of the distal circumflex. He had mild/moderate disease in his PDA after his SVG to his PDA insertion.   A nuclear perfusion study in June 2014 after experiencing some vague episodes of chest pain was low risk and showed distal anteroseptal scar with the extent of 10% without associated ischemia.  His nuclear study in April 2017 revealed a large new defect inferolaterally with scar/ischemia that was not present in 2014.  He underwent cardiac catheterization with findings as noted above.  His ischemia was secondary to new high-grade  stenosis in the mid PDA and distal PDA for which he underwent successful stenting of the 95% stenosis which was reduced to 0% and PTCA of the very distal 75% stenosis reduced to 0%.  He was enrolled in the research trial and received a polymer free BioFreedom stent.  His aspirin was subsequently to been stopped and he continues to be on Plavix 75 mg daily.  He has not had any bleeding or GI issues.  When I last saw him, he had noticed some mild episodes of chest discomfort.  His symptoms resolved with medication titration.  His most recent nuclear study, most likely revealed attenuation artifact from his diagram although is small zone of possible ischemia cannot be excluded apically.  well as there were areas of attenuation artifact from his diaphragm.  A small zone of possible ischemia could not be excluded apically.  He is exercising regularly currently without symptoms.  His blood pressure today is low normal.  Recent creatinine was 1.61.  I've suggested  a trial of reducing his but has approval from 40 mg down to 20 mg.  He will monitor his blood pressure.  Follow-up chemistry profile will be obtained to reassess renal function.  He continues to be on atorvastatin for hyperlipidemia.  Lipid studies were reviewed.  He is diabetic on insulin, Levemir, and metformin in addition to to Trulicity.  I will see him in 6 months for reevaluation or sooner if problems arise.  Time spent: 25 minutes  Troy Sine, MD, [facility]  05/30/2017 7:29 PM

## 2017-05-30 ENCOUNTER — Telehealth: Payer: Self-pay | Admitting: Cardiovascular Disease

## 2017-05-30 NOTE — Telephone Encounter (Signed)
Okay to resume prior dose but would recheck a be met in 1 week

## 2017-05-30 NOTE — Telephone Encounter (Signed)
Returned call to pt he states that his heart has been cutting up-starting and stopping, this has been happening yesterday on and off then is much better this morning and recently has stopped this moprning. He states that when he woke up this morning his vision was blurred and has a headache, but now the blurred vision has stopped. His BP today was 185/85 HR 67. He states that Tuesday he cut his medication in half to benazepril 20mg  daily from 40mg  due to his kidney function( Creatinine 1.51), pt would like to go back to the original dose of the benazepril. Due for f/u BMP in one month, to check again. Please advise

## 2017-05-30 NOTE — Telephone Encounter (Signed)
New message   Patient concerned medication is not working. Wants to know if he should go back to taking 40mg  of Benazepril. Please call  Pt c/o medication issue:  1. Name of Medication: benazepril (LOTENSIN) 20mg  2. How are you currently taking this medication (dosage and times per day)? Take 1 tablet (20 mg total) by mouth daily.  3. Are you having a reaction (difficulty breathing--STAT)? NO 4. What is your medication issue?  Patient states blood pressure too high, wants dosage changed  Pt c/o BP issue: STAT if pt c/o blurred vision, one-sided weakness or slurred speech  1. What are your last 5 BP readings? 185/85,  2. Are you having any other symptoms (ex. Dizziness, headache, blurred vision, passed out)? Blurred vision  3. What is your BP issue? BP too high

## 2017-05-31 NOTE — Telephone Encounter (Signed)
Spoke with pt he states that he "had a good night" last night and his BP was in the low 140's/60's. He states that he will continue to monitor and will call back if he thinks that he will need to resume previous dose.

## 2017-06-11 DIAGNOSIS — Z6830 Body mass index (BMI) 30.0-30.9, adult: Secondary | ICD-10-CM | POA: Diagnosis not present

## 2017-06-11 DIAGNOSIS — E785 Hyperlipidemia, unspecified: Secondary | ICD-10-CM | POA: Diagnosis not present

## 2017-06-11 DIAGNOSIS — Z23 Encounter for immunization: Secondary | ICD-10-CM | POA: Diagnosis not present

## 2017-06-11 DIAGNOSIS — E1129 Type 2 diabetes mellitus with other diabetic kidney complication: Secondary | ICD-10-CM | POA: Diagnosis not present

## 2017-06-11 DIAGNOSIS — Z125 Encounter for screening for malignant neoplasm of prostate: Secondary | ICD-10-CM | POA: Diagnosis not present

## 2017-06-11 DIAGNOSIS — I129 Hypertensive chronic kidney disease with stage 1 through stage 4 chronic kidney disease, or unspecified chronic kidney disease: Secondary | ICD-10-CM | POA: Diagnosis not present

## 2017-06-11 DIAGNOSIS — E039 Hypothyroidism, unspecified: Secondary | ICD-10-CM | POA: Diagnosis not present

## 2017-09-16 DIAGNOSIS — E039 Hypothyroidism, unspecified: Secondary | ICD-10-CM | POA: Diagnosis not present

## 2017-09-16 DIAGNOSIS — E1129 Type 2 diabetes mellitus with other diabetic kidney complication: Secondary | ICD-10-CM | POA: Diagnosis not present

## 2017-09-16 DIAGNOSIS — I129 Hypertensive chronic kidney disease with stage 1 through stage 4 chronic kidney disease, or unspecified chronic kidney disease: Secondary | ICD-10-CM | POA: Diagnosis not present

## 2017-09-16 DIAGNOSIS — Z6830 Body mass index (BMI) 30.0-30.9, adult: Secondary | ICD-10-CM | POA: Diagnosis not present

## 2017-09-16 DIAGNOSIS — E785 Hyperlipidemia, unspecified: Secondary | ICD-10-CM | POA: Diagnosis not present

## 2017-10-01 DIAGNOSIS — Z6830 Body mass index (BMI) 30.0-30.9, adult: Secondary | ICD-10-CM | POA: Diagnosis not present

## 2017-10-01 DIAGNOSIS — J069 Acute upper respiratory infection, unspecified: Secondary | ICD-10-CM | POA: Diagnosis not present

## 2017-10-15 ENCOUNTER — Other Ambulatory Visit: Payer: Self-pay | Admitting: Cardiovascular Disease

## 2017-11-11 ENCOUNTER — Telehealth: Payer: Self-pay | Admitting: *Deleted

## 2017-11-11 NOTE — Telephone Encounter (Signed)
LEADERS FREE II Research study (Stent) 24 monthTelephone follow up attempted. Left message for patient to call office.

## 2017-11-19 ENCOUNTER — Encounter: Payer: Self-pay | Admitting: *Deleted

## 2017-11-19 NOTE — Progress Notes (Unsigned)
LEADERS FREE II research study 24 month telephone follow up completed. Patient denies any chest pain, adverse events or medication changes. Next research required follow up window will be 12/feb/20-20/jun/20.

## 2017-11-26 ENCOUNTER — Other Ambulatory Visit: Payer: Self-pay | Admitting: Cardiovascular Disease

## 2017-11-26 NOTE — Telephone Encounter (Signed)
REFILL 

## 2017-12-04 DIAGNOSIS — Z9181 History of falling: Secondary | ICD-10-CM | POA: Diagnosis not present

## 2017-12-04 DIAGNOSIS — Z1331 Encounter for screening for depression: Secondary | ICD-10-CM | POA: Diagnosis not present

## 2017-12-04 DIAGNOSIS — E785 Hyperlipidemia, unspecified: Secondary | ICD-10-CM | POA: Diagnosis not present

## 2017-12-04 DIAGNOSIS — Z0000 Encounter for general adult medical examination without abnormal findings: Secondary | ICD-10-CM | POA: Diagnosis not present

## 2017-12-04 DIAGNOSIS — Z125 Encounter for screening for malignant neoplasm of prostate: Secondary | ICD-10-CM | POA: Diagnosis not present

## 2017-12-04 DIAGNOSIS — Z6830 Body mass index (BMI) 30.0-30.9, adult: Secondary | ICD-10-CM | POA: Diagnosis not present

## 2017-12-17 DIAGNOSIS — E039 Hypothyroidism, unspecified: Secondary | ICD-10-CM | POA: Diagnosis not present

## 2017-12-17 DIAGNOSIS — Z6829 Body mass index (BMI) 29.0-29.9, adult: Secondary | ICD-10-CM | POA: Diagnosis not present

## 2017-12-17 DIAGNOSIS — E1129 Type 2 diabetes mellitus with other diabetic kidney complication: Secondary | ICD-10-CM | POA: Diagnosis not present

## 2017-12-17 DIAGNOSIS — I129 Hypertensive chronic kidney disease with stage 1 through stage 4 chronic kidney disease, or unspecified chronic kidney disease: Secondary | ICD-10-CM | POA: Diagnosis not present

## 2017-12-17 DIAGNOSIS — E785 Hyperlipidemia, unspecified: Secondary | ICD-10-CM | POA: Diagnosis not present

## 2018-01-11 ENCOUNTER — Other Ambulatory Visit: Payer: Self-pay | Admitting: Cardiovascular Disease

## 2018-01-13 NOTE — Telephone Encounter (Signed)
Rx(s) sent to pharmacy electronically.  

## 2018-02-13 ENCOUNTER — Other Ambulatory Visit: Payer: Self-pay | Admitting: Cardiovascular Disease

## 2018-02-14 ENCOUNTER — Other Ambulatory Visit: Payer: Self-pay

## 2018-03-24 ENCOUNTER — Other Ambulatory Visit: Payer: Self-pay | Admitting: Cardiovascular Disease

## 2018-03-25 DIAGNOSIS — E039 Hypothyroidism, unspecified: Secondary | ICD-10-CM | POA: Diagnosis not present

## 2018-03-25 DIAGNOSIS — E1129 Type 2 diabetes mellitus with other diabetic kidney complication: Secondary | ICD-10-CM | POA: Diagnosis not present

## 2018-03-25 DIAGNOSIS — I129 Hypertensive chronic kidney disease with stage 1 through stage 4 chronic kidney disease, or unspecified chronic kidney disease: Secondary | ICD-10-CM | POA: Diagnosis not present

## 2018-03-25 DIAGNOSIS — E785 Hyperlipidemia, unspecified: Secondary | ICD-10-CM | POA: Diagnosis not present

## 2018-05-12 ENCOUNTER — Other Ambulatory Visit: Payer: Self-pay | Admitting: Cardiovascular Disease

## 2018-05-14 ENCOUNTER — Telehealth: Payer: Self-pay | Admitting: Cardiovascular Disease

## 2018-05-14 DIAGNOSIS — Z6831 Body mass index (BMI) 31.0-31.9, adult: Secondary | ICD-10-CM | POA: Diagnosis not present

## 2018-05-14 DIAGNOSIS — R600 Localized edema: Secondary | ICD-10-CM | POA: Diagnosis not present

## 2018-05-14 NOTE — Telephone Encounter (Signed)
New Message   Pt c/o medication issue:  1. Name of Medication: amLODipine (NORVASC) 10 MG tablet  2. How are you currently taking this medication (dosage and times per day)? Take 1 tablet (10 mg total) by mouth daily.  3. Are you having a reaction (difficulty breathing--STAT)? no  4. What is your medication issue? Pharmacy calling, states that the pt was told to take a half tablet by mouth but the pt is unable to cut the tablets in half so they want to know if a 90 day prescription be faxed over for 5 mg tablets

## 2018-05-14 NOTE — Telephone Encounter (Signed)
Returned call to pharmacy-advised I do not see where amlodipine has been decreased to 5 mg.   Advised he has appt upcoming in a few days and this can be addressed at that time.  If dose changes, will update rx.

## 2018-05-19 ENCOUNTER — Encounter: Payer: Self-pay | Admitting: Cardiovascular Disease

## 2018-05-19 ENCOUNTER — Ambulatory Visit (INDEPENDENT_AMBULATORY_CARE_PROVIDER_SITE_OTHER): Payer: Medicare Other | Admitting: Cardiovascular Disease

## 2018-05-19 VITALS — BP 143/80 | HR 69 | Ht 68.0 in | Wt 203.8 lb

## 2018-05-19 DIAGNOSIS — N183 Chronic kidney disease, stage 3 unspecified: Secondary | ICD-10-CM

## 2018-05-19 DIAGNOSIS — Z79899 Other long term (current) drug therapy: Secondary | ICD-10-CM | POA: Diagnosis not present

## 2018-05-19 DIAGNOSIS — I10 Essential (primary) hypertension: Secondary | ICD-10-CM | POA: Diagnosis not present

## 2018-05-19 DIAGNOSIS — E785 Hyperlipidemia, unspecified: Secondary | ICD-10-CM | POA: Diagnosis not present

## 2018-05-19 DIAGNOSIS — I2510 Atherosclerotic heart disease of native coronary artery without angina pectoris: Secondary | ICD-10-CM

## 2018-05-19 DIAGNOSIS — R600 Localized edema: Secondary | ICD-10-CM

## 2018-05-19 DIAGNOSIS — Z951 Presence of aortocoronary bypass graft: Secondary | ICD-10-CM | POA: Diagnosis not present

## 2018-05-19 DIAGNOSIS — Z5181 Encounter for therapeutic drug level monitoring: Secondary | ICD-10-CM | POA: Diagnosis not present

## 2018-05-19 DIAGNOSIS — I480 Paroxysmal atrial fibrillation: Secondary | ICD-10-CM | POA: Diagnosis not present

## 2018-05-19 DIAGNOSIS — Z7901 Long term (current) use of anticoagulants: Secondary | ICD-10-CM

## 2018-05-19 MED ORDER — APIXABAN 5 MG PO TABS: 5.0000 mg | ORAL_TABLET | Freq: Two times a day (BID) | ORAL | 3 refills | Status: DC

## 2018-05-19 MED ORDER — HYDRALAZINE HCL 25 MG PO TABS: 25.0000 mg | ORAL_TABLET | Freq: Two times a day (BID) | ORAL | 3 refills | Status: DC

## 2018-05-19 NOTE — Patient Instructions (Addendum)
Medication Instructions:  STOP amlodipine  START hydralazine 25 mg (1 tablet) two times daily  Take furosemide (lasix) 40 mg (1 tablet) two times daily for 3 days Then decrease to 40 mg (1 tablet) daily  STOP Plavix START Eliquis 5 mg two times daily  If you need a refill on your cardiac medications before your next appointment, please call your pharmacy.   Lab work: Please return for FASTING labs in 1-2 weeks (CMET, CBC, Lipid, TSH, Mag)  Our in office lab hours are Monday-Friday 8:00-4:00, closed for lunch 12:45-1:45 pm.  No appointment needed.  If you have labs (blood work) drawn today and your tests are completely normal, you will receive your results only by: Marland Kitchen MyChart Message (if you have MyChart) OR . A paper copy in the mail If you have any lab test that is abnormal or we need to change your treatment, we will call you to review the results.  Testing/Procedures: Your physician has requested that you have a lower extremity venous duplex. This test is an ultrasound of the veins in the legs. It looks at venous blood flow that carries blood from the heart to the legs. Allow one hour for a Lower Venous exam. There are no restrictions or special instructions.  Your physician has requested that you have an echocardiogram. Echocardiography is a painless test that uses sound waves to create images of your heart. It provides your doctor with information about the size and shape of your heart and how well your heart's chambers and valves are working. This procedure takes approximately one hour. There are no restrictions for this procedure.  This will be done at our [facility] location:  [address]   Follow-Up: Friday 11/1 at 9 AM with Dr. Claiborne Billings  Any Other Special Instructions Will Be Listed Below (If Applicable).  How to Use Compression Stockings Compression stockings are elastic socks that squeeze the legs. They help to increase blood flow to the legs, decrease  swelling in the legs, and reduce the chance of developing blood clots in the lower legs. Compression stockings are often used by people who:  Are recovering from surgery.  Have poor circulation in their legs.  Are prone to getting blood clots in their legs.  Have varicose veins.  Sit or stay in bed for long periods of time.  How to use compression stockings Before you put on your compression stockings:  Make sure that they are the correct size. If you do not know your size, ask your health care provider.  Make sure that they are clean, dry, and in good condition.  Check them for rips and tears. Do not put them on if they are ripped or torn.  Put your stockings on first thing in the morning, before you get out of bed. Keep them on for as long as your health care provider advises. When you are wearing your stockings:  Keep them as smooth as possible. Do not allow them to bunch up. It is especially important to prevent the stockings from bunching up around your toes or behind your knees.  Do not roll the stockings downward and leave them rolled down. This can decrease blood flow to your leg.  Change them right away if they become wet or dirty.  When you take off your stockings, inspect your legs and feet. Anything that does not seem normal may require medical attention. Look for:  Open sores.  Red spots.  Swelling.  Information and  tips  Do not stop wearing your compression stockings without talking to your health care provider first.  Wash your stockings every day with mild detergent in cold or warm water. Do not use bleach. Air-dry your stockings or dry them in a clothes dryer on low heat.  Replace your stockings every 3-6 months.  If skin moisturizing is part of your treatment plan, apply lotion or cream at night so that your skin will be dry when you put on the stockings in the morning. It is harder to put the stockings on when you have lotion on your legs or  feet. Contact a health care provider if: Remove your stockings and seek medical care if:  You have a feeling of pins and needles in your feet or legs.  You have any new changes in your skin.  You have skin lesions that are getting worse.  You have swelling or pain that is getting worse.  Get help right away if:  You have numbness or tingling in your lower legs that does not get better right after you take the stockings off.  Your toes or feet become cold and blue.  You develop open sores or red spots on your legs that do not go away.  You see or feel a warm spot on your leg.  You have new swelling or soreness in your leg.  You are short of breath or you have chest pain for no reason.  You have a rapid or irregular heartbeat.  You feel light-headed or dizzy. This information is not intended to replace advice given to you by your health care provider. Make sure you discuss any questions you have with your health care provider. Document Released: 05/20/2009 Document Revised: 12/21/2015 Document Reviewed: 06/30/2014 Elsevier Interactive Patient Education  [employer].

## 2018-05-19 NOTE — Progress Notes (Signed)
Patient ID: Roberto Haley, male   DOB: Dec 19, 1940, 77 y.o.   MRN: [id number]     Primary M.D.: Dr. Nelda Bucks  HPI: Roberto Haley is a 77 y.o. male who presents to the office for a 12 month cardiology followup evaluation.   Roberto Haley has known CAD and underwent CABG revascularization surgery in 1997 with LIMA to LAD, vein to the RCA. In March 1998 he underwent complex intervention to his native LAD and high speed rotational atherectomy (HSRA) and stenting of his LAD and PTCA of his diagonal vessel after he was found to have stenosis in the LIMA graft. In June 2013 due to 99% in-stent restenosis in the LAD stent and a new stenosis of 90% the distal circumflex, he underwent two-vessel intervention with insertion of a 2.75x22 mm resident stent in the LAD and PTCA of the distal circumflex via his previously placed stent which started in the mid left main coronary artery. A cardiopulmonary met test showed reduced maximum oxygen consumption. I  reduced his beta blocker therapy due to significant chronotropic incompetence and further titrated his Ranexa to 1000 mg twice a day.  A nuclear perfusion study in June 2014 showed distal anteroseptal scar without significant ischemia. He did have PACs and PVCs in gating was not done.  He  fell on the Fourth of July 2014 and landed on his head. He ultimately was transferred to [facility] where was hospitalized for a week. He did not have neurosurgery. He was told of having "blood on his brain."  He did have a left-sided per hemiparesis. He then spent 2 months at [facility] rehabilitation unit and has spent additional time a universal rehabilitation in [place] until October 31.  He is significant improved following his head trauma with return of his memory.  Last year, I reduced his beta blocker therapy due to bradycardia.  He underwent a nuclear perfusion study on 11/22/2015 which now showed an ejection fraction of 42% with inferolateral hypokinesis.   There was a new large defect in the inferolateral wall and there was no change in his previous anteroseptal defect.  There was concern for scar/ischemia inferolaterally which was new.   He underwent cardiac catheterization by me on 04/28/2017and was found to have significant native CAD with a patent stent extending from the mid left main into the proximal LAD, 50% narrowings in the LAD after the first diagonal vessel and widely patent mid LAD stent.  There was pain PTCA sites in the left circumflex proximally at the mid segment with 50% stenosis a small OM branch.  There was new total occlusion of his proximal native RCA with faint bridging collaterals and faint collateralization to the distal branch of the RCA which had not been supplied by the vein graft.  He had previously documented occluded LIMA graft.  The vein graft supplying the PDA and PLA vessel was patent but he had new distal disease with 95% stenosis in the mid PDA and very distal 75% stenosis.  He underwent successful intervention to his PDA and due to his prior history of head trauma and CNS bleed.  He underwent insertion of a research BioFreedom stent and after  30 days was transitioned to only single anti platelet therapy rather than DAPT.  Since undergoing his PCI to his mid PDA he has noted marked improvement in his ability to be active.  He continues to participate in cardiac rehabilitation and often walks 1 mile in the morning before breakfast and feels well.  He has noticed occasional angina episodes of angina, particularly when he is tired.    In June 2017 he had a brief episode of PAF, alt doing cardiac rehabilitation.  He states that he has not been sleeping very well.  He has not had any GI issues, but he has only had one colonoscopy in his life and he is 77 years old.  He is seen by Dr. Lyda Jester in [place] and was scheduled for screening colonoscopy and EGD.  When I last saw him, he had noticed some vague exertional chest  discomfort which did not occur during walks in the early morning, but he had noticed it more at the end of the day.  I further titrated his isosorbide and increase this to 90 mg in the morning and 30 mg at night.  He has continued Toprol-XL for beta blocker therapy.  I referred him for a nuclear perfusion study which was done on 10/18/2016.  This showed a calculated EF of 48% but visually it appeared to be 55%.  There was mild possible scar as well as a component of diaphragmatic attenuation but a small area of ischemia could not be completely excluded apically.   When I last saw him in October 2018 he was doing well from a cardiac standpoint and was walking 1-1/2-2 miles per day in the morning and 3 days per week also going to cardiac rehabilitation.  He denied chest pain, PND, orthopnea.  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 Laboratory by Dr. Eden Emms.: Glucose was 163.  Creatinine had increased to 1.61.  Lipids were excellent with a total cholesterol 107, triglycerides 71, HDL 41, and LDL 52.  Hemoglobin A1c was 8.0.   Since I last saw him, he has been caring for his wife who has developed Alzheimer's disease.  As result he is her primary caregiver.  He is not walking as often.  Over the past month he has noticed some increased leg swelling and saw Dr. Elissa Hefty last week.  At that time, his amlodipine was reduced from 10 mg down to 5 mg and he was given a prescription for furosemide which she had just started 2 days previously.  He denies any chest pain or palpitations.  He denies presyncope or syncope.  He presents for evaluation.  Past Medical History:  Diagnosis Date  . Arthritis   . Chronic kidney disease    CKD STAGE 3;  CHRONIC RENAL INSUFFICIENCY  . Coronary artery disease   . Diabetes mellitus   . Groin hematoma 02/19/12   RIGHT  . Hyperlipidemia   . Hypertension   . Neuropathy due to type 2 diabetes mellitus ([employer])   . Obesity   . PVC (premature ventricular contraction)   . SDH  (subdural hematoma) ([place]) 02/06/2013   R parafalcine SDH after a fall, rx at [facility]    Past Surgical History:  Procedure Laterality Date  . CARDIAC CATHETERIZATION  01/14/12   LV FXN EF 50-55% W/MILD DISTAL INFERIOR HYPOKINESIS; PCI: LAD PTCA/STENT W/NEW 2.75X22 RESOLUTE DES IN MID LAD POST DIALTED TO 3.08 TO 2.97 TAPER: 99%-80% TO 0; LCX: PTCA VIA LM STENT W/2.25X12 SPRINTER BALLOON: 90%-5; LAD: PATENT PROXIMAL STENT EXTENDING INTO LM W/30-40% SMOOTH NARROWING IN DISTAL PORTION OF STENT; 99% IN STENT RESTENOSIS OF MID LAD STENT   . CARDIAC CATHETERIZATION N/A 12/02/2015   Procedure: Left Heart Cath and Coronary Angiography;  Surgeon: Troy Sine, MD;  Location: [place] [facility];  Service: Cardiovascular;  Laterality: N/A;  .  CARDIAC CATHETERIZATION N/A 12/02/2015   Procedure: Coronary Stent Intervention;  Surgeon: Troy Sine, MD;  Location: [place] [facility];  Service: Cardiovascular;  Laterality: N/A;  . CORONARY ARTERY BYPASS GRAFT  1997   LIMA to the LAD and vein to the RCA;  . CORONARY STENT PLACEMENT  12/02/2015   PAD  . DOPPLER ECHOCARDIOGRAPHY  01/09/12   LV NORMAL IN SIZE, NORMAL WAL THICKNESS, EF 50-55%; MILD POSTERIOR WALL HYOPKINESIS, THERE IS MILD INFERIOR WALL HYPOKINESIS  . HYPOTHENAR FAT PAD TRANSFER    . LEFT HEART CATHETERIZATION WITH CORONARY/GRAFT ANGIOGRAM N/A 01/14/2012   Procedure: LEFT HEART CATHETERIZATION WITH Beatrix Fetters;  Surgeon: Troy Sine, MD;  Location: [facility] CATH LAB;  Service: Cardiovascular;  Laterality: N/A;  . LOWER ARTERIAL DOPPLER  01/24/12   ESSENTIALLY NORMAL RIGHT GROIN DUPLEX EVALUATION S/P THROMBIN INJECTION  . LOWER VENOUS DUPLEX  02/05/12   ESSENTIALLY NORMAL RIGHT LOWER EXTRIMTY VENOUS DUPLEX DOPPLER EVALUATION  . NM MYOVIEW LTD  01/09/12   HIGH RISK SCAN. COMPARED TO PREVIOUS STUDY, THERE IS NOW ISCHEMIA PRESENT. ABNORMAL MYOCARDIAL PERFUSION STUDY. THERE IS NEW MILD INFEROLATERAL ISCHEMIA TOWARDS THE BASE; PT TO FOLLOW UP WITH DR.  [name]  . PERCUTANEOUS CORONARY STENT INTERVENTION (PCI-S) N/A 01/14/2012   Procedure: PERCUTANEOUS CORONARY STENT INTERVENTION (PCI-S);  Surgeon: Troy Sine, MD;  Location[facility] CATH LAB;  Service: Cardiovascular;  Laterality: N/A;    Allergies  Allergen Reactions  . Diltiazem Other (See Comments)    Unknown  . Lovastatin Other (See Comments)    Unknown  . Proton Pump Inhibitors Other (See Comments)    Unknown   . Tramadol Nausea And Vomiting  . Codeine Rash    Current Outpatient Medications  Medication Sig Dispense Refill  . atorvastatin (LIPITOR) 40 MG tablet Take 40 mg by mouth daily.   0  . benazepril (LOTENSIN) 20 MG tablet TAKE 1 TABLET(20 MG) BY MOUTH DAILY 90 tablet 0  . cholecalciferol (VITAMIN D) 1000 UNITS tablet Take 1,000 Units by mouth daily.    . insulin aspart (NOVOLOG) 100 UNIT/ML FlexPen Inject 12 Units into the skin 2 (two) times daily with a meal.     . isosorbide mononitrate (IMDUR) 60 MG 24 hr tablet Take 1.5 tablets (90 mg total) by mouth every morning AND 0.5 tablets (30 mg total) every evening. 180 tablet 1  . LEVEMIR FLEXTOUCH 100 UNIT/ML Pen Inject 42 Units into the skin 2 (two) times daily.   12  . LINZESS 145 MCG CAPS capsule TK 1 C PO D  11  . metFORMIN (GLUCOPHAGE-XR) 750 MG 24 hr tablet Take 750 mg by mouth daily with breakfast.    . metoprolol succinate (TOPROL-XL) 100 MG 24 hr tablet Take 50 mg by mouth daily.    . Multiple Vitamin (MULTIVITAMIN WITH MINERALS) TABS Take 1 tablet by mouth daily.    . nitroGLYCERIN (NITROSTAT) 0.4 MG SL tablet Place 1 tablet (0.4 mg total) under the tongue every 5 (five) minutes as needed. For chest pain 25 tablet 12  . NOVOFINE AUTOCOVER 30G X 8 MM MISC     . ONE TOUCH ULTRA TEST test strip   4  . oxybutynin (DITROPAN) 5 MG tablet Take 1 tablet by mouth daily.    Marland Kitchen PREVNAR 13 SUSP injection   0  . ranitidine (ZANTAC) 150 MG tablet Take 150 mg by mouth 2 (two) times daily.    Marland Kitchen SYNTHROID 75 MCG tablet Take 1 tablet by  mouth daily.     Marland Kitchen  TRULICITY 1.5 NV/9.76YO SOPN Inject 1.5 mg as directed once a week.   12  . vitamin C (ASCORBIC ACID) 500 MG tablet Take 500 mg by mouth daily.    Marland Kitchen apixaban (ELIQUIS) 5 MG TABS tablet Take 1 tablet (5 mg total) by mouth 2 (two) times daily. 60 tablet 3  . furosemide (LASIX) 40 MG tablet TAKE 1 TABLET PO QAM FOR LEG SWELLING  12  . hydrALAZINE (APRESOLINE) 25 MG tablet Take 1 tablet (25 mg total) by mouth 2 (two) times daily. 180 tablet 3   No current facility-administered medications for this visit.     Socially, he is married has one child 2 grandchildren and 2 great-grandchildren. There is no tobacco or alcohol use.  ROS General: Negative; No fevers, chills, or night sweats;  HEENT: Positive for cataracts; No changes in hearing, sinus congestion, difficulty swallowing Pulmonary: Negative; No cough, wheezing, shortness of breath, hemoptysis Cardiovascular: See history of present illness;  GI: Positive for GERD on ranitidine; No nausea, vomiting, diarrhea, or abdominal pain GU: Negative; No dysuria, hematuria, or difficulty voiding Musculoskeletal: Negative; no myalgias, joint pain, or weakness Hematologic/Oncology: Negative; no easy bruising, bleeding Endocrine: Positive for hypothyroidism, on Synthroid replacement; diabetes not well controlled Neuro: Negative; no changes in balance, headaches Skin: Negative; No rashes or skin lesions Psychiatric: Negative; No behavioral problems, depression Sleep: Negative; No snoring, daytime sleepiness, hypersomnolence, bruxism, restless legs, hypnogognic hallucinations, no cataplexy Other comprehensive 14 point system review is negative.  PE BP (!) 143/80   Pulse 69   Ht '5\' 8"'  (1.727 m)   Wt 203 lb 12.8 oz (92.4 kg)   BMI 30.99 kg/m    Repeat blood pressure by me was 140/84  Wt Readings from Last 3 Encounters:  05/19/18 203 lb 12.8 oz (92.4 kg)  05/28/17 196 lb (88.9 kg)  11/12/16 207 lb 3.2 oz (94 kg)   General:  Alert, oriented, no distress.  Skin: normal turgor, no rashes, warm and dry HEENT: Normocephalic, atraumatic. Pupils equal round and reactive to light; sclera anicteric; extraocular muscles intact;  Nose without nasal septal hypertrophy Mouth/Parynx benign; Mallinpatti scale 3 Neck: No JVD, no carotid bruits; normal carotid upstroke Lungs: clear to ausculatation and percussion; no wheezing or rales Chest wall: without tenderness to palpitation Heart: PMI not displaced, irregularly irregular consistent with atrial fibrillation, s1 s2 normal, 1/6 systolic murmur, no diastolic murmur, no rubs, gallops, thrills, or heaves Abdomen: soft, nontender; no hepatosplenomehaly, BS+; abdominal aorta nontender and not dilated by palpation. Back: no CVA tenderness Pulses 2+ Musculoskeletal: full range of motion, normal strength, no joint deformities Extremities: 2+ pitting edema to below the knees bilaterally.  No clubbing cyanosis or edema, Homan's sign negative  Neurologic: grossly nonfocal; Cranial nerves grossly wnl Psychologic: Normal mood and affect   ECG (independently read by me): Atrial Fibrillation at 69 ; NS t changes   October 2018 ECG (independently read by me): Sinus bradycardia 57 bpm.  Q wave in lead 3.  Nondiagnostic and aVF.  April 2018 ECG (independently read by me): Normal sinus rhythm at 61 bpm.  First-degree AV block with a PR interval 206 ms.  Q wave in 3 and aVF.  June 2017 ECG (independently read by me): Sinus bradycardia 58 bpm.  Inferior Q waves.  Normal intervals.  No ST segment changes.  May 2017 ECG (independently read by me): Sinus bradycardia 59 bpm.  LVH by voltage criteria.  September 2016 ECG (independently read by me):  Normal sinus rhythm at 65  bpm. Mild voltage for LVH.  01/13/2015 ECG (independently read by me): Sinus bradycardia 58 bpm.  Normal intervals.  October 2015 ECG (independent read by me): Sinus bradycardia at 50 bpm.  No ectopy.  PR interval 196  msec,   Borderline LVH.  Prior June 2015 ECG (independently read by me): Sinus bradycardia 52 beats per minute.  Borderline first degree AV block with a PR interval of 200 ms.  December 2014 ECG (independently read by me): Sinus rhythm at 54 beats per minute. No ectopy. Normal intervals.  ECG from 07/08/2013 : reveals sinus rhythm at 57 beats per minute. QTc interval 453 ms.  LABS: BMP Latest Ref Rng & Units 12/03/2015 11/25/2015 11/01/2014  Glucose 65 - 99 mg/dL 110(H) 174(H) -  BUN 6 - 20 mg/dL 16 20 -  Creatinine 0.61 - 1.24 mg/dL 1.22 1.46(H) 1.6(A)  Sodium 135 - 145 mmol/L 141 140 -  Potassium 3.5 - 5.1 mmol/L 3.3(L) 4.3 -  Chloride 101 - 111 mmol/L 108 103 -  CO2 22 - 32 mmol/L 22 29 -  Calcium 8.9 - 10.3 mg/dL 8.7(L) 9.1 -   Hepatic Function Latest Ref Rng & Units 11/25/2015  Total Protein 6.1 - 8.1 g/dL 7.1  Albumin 3.6 - 5.1 g/dL 4.3  AST 10 - 35 U/L 15  ALT 9 - 46 U/L 16  Alk Phosphatase 40 - 115 U/L 58  Total Bilirubin 0.2 - 1.2 mg/dL 0.6   CBC Latest Ref Rng & Units 12/03/2015 11/25/2015 01/19/2012  WBC 4.0 - 10.5 K/uL 9.3 8.6 9.1  Hemoglobin 13.0 - 17.0 g/dL 12.2(L) 14.2 10.1(L)  Hematocrit 39.0 - 52.0 % 36.9(L) 41.9 30.1(L)  Platelets 150 - 400 K/uL 187 239 190   Lab Results  Component Value Date   MCV 90.0 12/03/2015   MCV 89.9 11/25/2015   MCV 88.3 01/19/2012   No results found for: TSH   Lab Results  Component Value Date   HGBA1C 10.7 (A) 11/01/2014   Lipid Panel      Component Value Date/Time   CHOL 138 11/01/2014   HDL 39 11/01/2014    RADIOLOGY: No results found.  IMPRESSION:  1. CAD in native artery   2. Hx of CABG   3. PAF (paroxysmal atrial fibrillation) ([place])   4. Alteration in anticoagulation   5. Bilateral leg edema   6. Essential hypertension   7. Hyperlipidemia LDL goal <70   8. Medication management   9. Stage 3 chronic kidney disease (HCC)     ASSESSMENT AND PLAN:  Roberto Haley is a 77 year old white male who is status post  CABG revascularization surgery in 1997 and has a documented occluded LIMA. He has previously undergone intervention both to his proximal LAD extending into the left main with stenting and stenting of his mid LAD. In June 2013 he developed severe in-stent restenosis of his LAD stent and a DES stent was in place at that time. We also went through the stent struts in the left main and performed PTCA of the distal circumflex. He had mild/moderate disease in his PDA after his SVG to his PDA insertion.   A nuclear perfusion study in June 2014 after experiencing some vague episodes of chest pain was low risk and showed distal anteroseptal scar with the extent of 10% without associated ischemia.  His nuclear study in April 2017 revealed a large new defect inferolaterally with scar/ischemia that was not present in 2014.  He underwent cardiac catheterization with findings as noted above.  His ischemia was secondary to new high-grade stenosis in the mid PDA and distal PDA for which he underwent successful stenting of the 95% stenosis which was reduced to 0% and PTCA of the very distal 75% stenosis reduced to 0%.  He was enrolled in the research trial and received a polymer free BioFreedom stent.  His aspirin was subsequently to been stopped and he continues to be on Plavix 75 mg daily.  He has not had any bleeding or GI issues.  Recently, he has developed lower extremity swelling which became significant and is now 2+.  His amlodipine was reduced to 5 mg and I am now recommending discontinuance of amlodipine altogether.  He will continue with his present dose of benazepril and Toprol-XL 50 mg.  His A. fib rate is controlled in the 60s.  I am increasing furosemide to 40 mg twice a day for 3 days and then he will take 40 mg daily.  I am adding hydralazine 25 mg twice a day to his medical regimen at this dose may need to be titrated depending upon blood pressure response.  I have suggested support stockings with at least 20 to 30  mm of support.  I am scheduling him for lower extremity Doppler studies to evaluate for venous insufficiency.  His ECG today shows atrial fibrillation of questionable duration.  I am instituting Eliquis 5 mg twice a day for anticoagulation and have recommended he discontinue Plavix.  I will not start aspirin.  He will undergo a repeat echo Doppler study for reassessment of LV function and chamber dimensions.  Fasting laboratory will be obtained over the next 1 to 2 weeks.  He continues to be on Trulicity and metformin for his diabetes mellitus in addition to  insulin.  I will see him in 2 to 3 weeks for follow-up evaluation.  Time spent: 40 minutes  Troy Sine, MD, [facility]  05/21/2018 7:29 PM

## 2018-05-20 ENCOUNTER — Telehealth: Payer: Self-pay | Admitting: Cardiovascular Disease

## 2018-05-20 MED ORDER — HYDRALAZINE HCL 25 MG PO TABS: 25.0000 mg | ORAL_TABLET | Freq: Two times a day (BID) | ORAL | 3 refills | Status: DC

## 2018-05-20 NOTE — Telephone Encounter (Signed)
Spoke with pt. Pt sts that he was seen yesterday by [facility] and there were several med changes. Pt would like to go over the changes again.   STOP amlodipine START hydralazine 25 mg (1 tablet) two times daily  Take furosemide (lasix) 40 mg (1 tablet) two times daily for 3 days Then decrease to 40 mg (1 tablet) daily  STOP Plavix START Eliquis 5 mg two times daily  Pt verbalized understanding and voiced appreciation for the assistance.

## 2018-05-20 NOTE — Telephone Encounter (Signed)
New Message:     Patient has additional questions about what medication he should be taking and stopping.

## 2018-05-21 ENCOUNTER — Encounter: Payer: Self-pay | Admitting: Cardiovascular Disease

## 2018-05-22 ENCOUNTER — Other Ambulatory Visit: Payer: Self-pay | Admitting: *Deleted

## 2018-05-22 ENCOUNTER — Ambulatory Visit (HOSPITAL_COMMUNITY)
Admission: RE | Admit: 2018-05-22 | Discharge: 2018-05-22 | Disposition: A | Discharge status: Home / Self Care | Payer: Medicare Other | Source: Ambulatory Visit | Attending: Cardiology | Admitting: Cardiology

## 2018-05-22 DIAGNOSIS — R600 Localized edema: Secondary | ICD-10-CM | POA: Diagnosis not present

## 2018-05-22 MED ORDER — FUROSEMIDE 40 MG PO TABS: ORAL_TABLET | ORAL | 3 refills | Status: DC

## 2018-05-22 MED ORDER — APIXABAN 5 MG PO TABS: 5.0000 mg | ORAL_TABLET | Freq: Two times a day (BID) | ORAL | 3 refills | Status: DC

## 2018-05-22 MED ORDER — ISOSORBIDE MONONITRATE ER 60 MG PO TB24: ORAL_TABLET | ORAL | 3 refills | Status: DC

## 2018-05-22 MED ORDER — ATORVASTATIN CALCIUM 40 MG PO TABS: 40.0000 mg | ORAL_TABLET | Freq: Every day | ORAL | 3 refills | Status: DC

## 2018-05-22 MED ORDER — METOPROLOL SUCCINATE ER 50 MG PO TB24: 50.0000 mg | ORAL_TABLET | Freq: Every day | ORAL | 3 refills | Status: DC

## 2018-05-22 MED ORDER — BENAZEPRIL HCL 20 MG PO TABS
ORAL_TABLET | ORAL | 3 refills | Status: DC
Start: 2018-05-22 — End: 2018-12-30

## 2018-05-28 ENCOUNTER — Ambulatory Visit (HOSPITAL_COMMUNITY): Payer: Medicare Other | Attending: Cardiology

## 2018-05-28 ENCOUNTER — Other Ambulatory Visit: Payer: Self-pay

## 2018-05-28 DIAGNOSIS — I480 Paroxysmal atrial fibrillation: Secondary | ICD-10-CM | POA: Insufficient documentation

## 2018-05-28 DIAGNOSIS — I2510 Atherosclerotic heart disease of native coronary artery without angina pectoris: Secondary | ICD-10-CM | POA: Diagnosis not present

## 2018-06-02 DIAGNOSIS — Z79899 Other long term (current) drug therapy: Secondary | ICD-10-CM | POA: Diagnosis not present

## 2018-06-02 DIAGNOSIS — R600 Localized edema: Secondary | ICD-10-CM | POA: Diagnosis not present

## 2018-06-02 DIAGNOSIS — N183 Chronic kidney disease, stage 3 (moderate): Secondary | ICD-10-CM | POA: Diagnosis not present

## 2018-06-02 DIAGNOSIS — I10 Essential (primary) hypertension: Secondary | ICD-10-CM | POA: Diagnosis not present

## 2018-06-02 DIAGNOSIS — I480 Paroxysmal atrial fibrillation: Secondary | ICD-10-CM | POA: Diagnosis not present

## 2018-06-02 DIAGNOSIS — I2510 Atherosclerotic heart disease of native coronary artery without angina pectoris: Secondary | ICD-10-CM | POA: Diagnosis not present

## 2018-06-02 DIAGNOSIS — E785 Hyperlipidemia, unspecified: Secondary | ICD-10-CM | POA: Diagnosis not present

## 2018-06-05 ENCOUNTER — Encounter: Payer: Self-pay | Admitting: Cardiovascular Disease

## 2018-06-05 ENCOUNTER — Ambulatory Visit (INDEPENDENT_AMBULATORY_CARE_PROVIDER_SITE_OTHER): Payer: Medicare Other | Admitting: Cardiovascular Disease

## 2018-06-05 VITALS — BP 122/78 | HR 67 | Ht 68.0 in | Wt 197.7 lb

## 2018-06-05 DIAGNOSIS — I10 Essential (primary) hypertension: Secondary | ICD-10-CM

## 2018-06-05 DIAGNOSIS — Z951 Presence of aortocoronary bypass graft: Secondary | ICD-10-CM

## 2018-06-05 DIAGNOSIS — Z7901 Long term (current) use of anticoagulants: Secondary | ICD-10-CM

## 2018-06-05 DIAGNOSIS — N183 Chronic kidney disease, stage 3 unspecified: Secondary | ICD-10-CM

## 2018-06-05 DIAGNOSIS — I4819 Other persistent atrial fibrillation: Secondary | ICD-10-CM

## 2018-06-05 DIAGNOSIS — R600 Localized edema: Secondary | ICD-10-CM

## 2018-06-05 DIAGNOSIS — I2510 Atherosclerotic heart disease of native coronary artery without angina pectoris: Secondary | ICD-10-CM | POA: Diagnosis not present

## 2018-06-05 DIAGNOSIS — E785 Hyperlipidemia, unspecified: Secondary | ICD-10-CM | POA: Diagnosis not present

## 2018-06-05 LAB — LIPID PANEL
CHOLESTEROL TOTAL: 97 mg/dL — AB (ref 100–199)
Chol/HDL Ratio: 2.9 ratio (ref 0.0–5.0)
HDL: 34 mg/dL — AB (ref >39)
LDL Calculated: 47 mg/dL (ref 0–99)
TRIGLYCERIDES: 78 mg/dL (ref 0–149)
VLDL Cholesterol Cal: 16 mg/dL (ref 5–40)

## 2018-06-05 LAB — COMPREHENSIVE METABOLIC PANEL
A/G RATIO: 1.8 (ref 1.2–2.2)
ALK PHOS: 66 IU/L (ref 39–117)
ALT: 31 IU/L (ref 0–44)
AST: 24 IU/L (ref 0–40)
Albumin: 4.2 g/dL (ref 3.5–4.8)
BILIRUBIN TOTAL: 0.8 mg/dL (ref 0.0–1.2)
BUN/Creatinine Ratio: 15 (ref 10–24)
BUN: 24 mg/dL (ref 8–27)
CHLORIDE: 104 mmol/L (ref 96–106)
CO2: 28 mmol/L (ref 20–29)
Calcium: 9.5 mg/dL (ref 8.6–10.2)
Creatinine, Ser: 1.57 mg/dL — ABNORMAL HIGH (ref 0.76–1.27)
GFR calc non Af Amer: 42 mL/min/{1.73_m2} — ABNORMAL LOW (ref >59)
GFR, EST AFRICAN AMERICAN: 49 mL/min/{1.73_m2} — AB (ref >59)
GLUCOSE: 137 mg/dL — AB (ref 65–99)
Globulin, Total: 2.4 g/dL (ref 1.5–4.5)
POTASSIUM: 4.1 mmol/L (ref 3.5–5.2)
Sodium: 142 mmol/L (ref 134–144)
Total Protein: 6.6 g/dL (ref 6.0–8.5)

## 2018-06-05 LAB — CBC
Hematocrit: 34.7 % — ABNORMAL LOW (ref 37.5–51.0)
Hemoglobin: 11.8 g/dL — ABNORMAL LOW (ref 13.0–17.7)
MCH: 30 pg (ref 26.6–33.0)
MCHC: 34 g/dL (ref 31.5–35.7)
MCV: 88 fL (ref 79–97)
Platelets: 189 10*3/uL (ref 150–450)
RBC: 3.93 x10E6/uL — AB (ref 4.14–5.80)
RDW: 13.5 % (ref 12.3–15.4)
WBC: 7.8 10*3/uL (ref 3.4–10.8)

## 2018-06-05 LAB — TSH: TSH: 1.89 u[IU]/mL (ref 0.450–4.500)

## 2018-06-05 LAB — MAGNESIUM: MAGNESIUM: 1.9 mg/dL (ref 1.6–2.3)

## 2018-06-05 MED ORDER — FUROSEMIDE 40 MG PO TABS
ORAL_TABLET | ORAL | 3 refills | Status: DC
Start: 2018-06-05 — End: 2019-03-13

## 2018-06-05 MED ORDER — METOPROLOL SUCCINATE ER 50 MG PO TB24
ORAL_TABLET | ORAL | 3 refills | Status: DC
Start: 2018-06-05 — End: 2018-07-08

## 2018-06-05 NOTE — Progress Notes (Signed)
Patient ID: Roberto Haley, male   DOB: 01/22/1941, 77 y.o.   MRN: [id number]     Primary M.D.: Dr. Nelda Bucks  HPI: Roberto Haley is a 77 y.o. male who presents to the office for a 2 week cardiology followup evaluation.   Roberto Haley has known CAD and underwent CABG revascularization surgery in 1997 with LIMA to LAD, vein to the RCA. In March 1998 he underwent complex intervention to his native LAD and high speed rotational atherectomy (HSRA) and stenting of his LAD and PTCA of his diagonal vessel after he was found to have stenosis in the LIMA graft. In June 2013 due to 99% in-stent restenosis in the LAD stent and a new stenosis of 90% the distal circumflex, he underwent two-vessel intervention with insertion of a 2.75x22 mm resident stent in the LAD and PTCA of the distal circumflex via his previously placed stent which started in the mid left main coronary artery. A cardiopulmonary met test showed reduced maximum oxygen consumption. I  reduced his beta blocker therapy due to significant chronotropic incompetence and further titrated his Ranexa to 1000 mg twice a day.  A nuclear perfusion study in June 2014 showed distal anteroseptal scar without significant ischemia. He did have PACs and PVCs in gating was not done.  He fell on the Fourth of July 2014 and landed on his head. He ultimately was transferred to [facility] where was hospitalized for a week. He did not have neurosurgery. He was told of having "blood on his brain."  He did have a left-sided per hemiparesis. He then spent 2 months at [facility] rehabilitation unit and has spent additional time a universal rehabilitation in [place] until October 31.  He is significant improved following his head trauma with return of his memory.  Last year, I reduced his beta blocker therapy due to bradycardia.  He underwent a nuclear perfusion study on 11/22/2015 which now showed an ejection fraction of 42% with inferolateral hypokinesis.   There was a new large defect in the inferolateral wall and there was no change in his previous anteroseptal defect.  There was concern for scar/ischemia inferolaterally which was new.   He underwent cardiac catheterization by me on 04/28/2017and was found to have significant native CAD with a patent stent extending from the mid left main into the proximal LAD, 50% narrowings in the LAD after the first diagonal vessel and widely patent mid LAD stent.  There was pain PTCA sites in the left circumflex proximally at the mid segment with 50% stenosis a small OM branch.  There was new total occlusion of his proximal native RCA with faint bridging collaterals and faint collateralization to the distal branch of the RCA which had not been supplied by the vein graft.  He had previously documented occluded LIMA graft.  The vein graft supplying the PDA and PLA vessel was patent but he had new distal disease with 95% stenosis in the mid PDA and very distal 75% stenosis.  He underwent successful intervention to his PDA and due to his prior history of head trauma and CNS bleed.  He underwent insertion of a research BioFreedom stent and after  30 days was transitioned to only single anti platelet therapy rather than DAPT.  Since undergoing his PCI to his mid PDA he has noted marked improvement in his ability to be active.  He continues to participate in cardiac rehabilitation and often walks 1 mile in the morning before breakfast and feels well.  He has noticed occasional angina episodes of angina, particularly when he is tired.    In June 2017 he had a brief episode of PAF, alt doing cardiac rehabilitation.  He states that he has not been sleeping very well.  He has not had any GI issues, but he has only had one colonoscopy in his life and he is 77 years old.  He is seen by Dr. Lyda Jester in [place] and was scheduled for screening colonoscopy and EGD.  When I last saw him, he had noticed some vague exertional chest  discomfort which did not occur during walks in the early morning, but he had noticed it more at the end of the day.  I further titrated his isosorbide and increase this to 90 mg in the morning and 30 mg at night.  He has continued Toprol-XL for beta blocker therapy.  I referred him for a nuclear perfusion study which was done on 10/18/2016.  This showed a calculated EF of 48% but visually it appeared to be 55%.  There was mild possible scar as well as a component of diaphragmatic attenuation but a small area of ischemia could not be completely excluded apically.   When I last saw him in October 2018 he was doing well from a cardiac standpoint and was walking 1-1/2-2 miles per day in the morning and 3 days per week also going to cardiac rehabilitation.  He denied chest pain, PND, orthopnea.  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 Laboratory by Dr. Eden Emms.: Glucose was 163.  Creatinine had increased to 1.61.  Lipids were excellent with a total cholesterol 107, triglycerides 71, HDL 41, and LDL 52.  Hemoglobin A1c was 8.0.   He has been caring for his wife who has developed Alzheimer's disease.  As result he is her primary caregiver.  He is not walking as often.  Over the past month he has noticed some increased leg swelling and saw Dr. Delena Bali last week.  At that time, his amlodipine was reduced from 10 mg down to 5 mg and he was given a prescription for furosemide.  I recently saw him on May 19, 2018 for one-year evaluation.  At that time, he had noticed some irregularity to his heart rhythm.  He was having lower extremity edema.  His ECG showed atrial fibrillation which was new and of questionable duration.  I discontinued amlodipine, increase furosemide to 40 mg twice a day for 3 days and added hydralazine 25 mg twice a day for his blood pressure.  I initiated Eliquis 5 mg twice a day for anticoagulation and recommended he discontinue Plavix.  Underwent an echo Doppler study on May 28, 2017  which showed an EF of 45 to 50%.  Mild Roberto and mild TR.  RV systolic function was mildly reduced.  Laboratory on June 02, 2018 revealed a magnesium of 1.9.  Creatinine had increased to 1.57.  He has felt well.  His edema has improved.  He denies chest pain presyncope or syncope.  He presents for reevaluation. Past Medical History:  Diagnosis Date  . Arthritis   . Chronic kidney disease    CKD STAGE 3;  CHRONIC RENAL INSUFFICIENCY  . Coronary artery disease   . Diabetes mellitus   . Groin hematoma 02/19/12   RIGHT  . Hyperlipidemia   . Hypertension   . Neuropathy due to type 2 diabetes mellitus ([place])   . Obesity   . PVC (premature ventricular contraction)   . SDH (subdural hematoma) (  [place]) 02/06/2013   R parafalcine SDH after a fall, rx at [facility]    Past Surgical History:  Procedure Laterality Date  . CARDIAC CATHETERIZATION  01/14/12   LV FXN EF 50-55% W/MILD DISTAL INFERIOR HYPOKINESIS; PCI: LAD PTCA/STENT W/NEW 2.75X22 RESOLUTE DES IN MID LAD POST DIALTED TO 3.08 TO 2.97 TAPER: 99%-80% TO 0; LCX: PTCA VIA LM STENT W/2.25X12 SPRINTER BALLOON: 90%-5; LAD: PATENT PROXIMAL STENT EXTENDING INTO LM W/30-40% SMOOTH NARROWING IN DISTAL PORTION OF STENT; 99% IN STENT RESTENOSIS OF MID LAD STENT   . CARDIAC CATHETERIZATION N/A 12/02/2015   Procedure: Left Heart Cath and Coronary Angiography;  Surgeon: Troy Sine, MD;  Location: [place] CV LAB;  Service: Cardiovascular;  Laterality: N/A;  . CARDIAC CATHETERIZATION N/A 12/02/2015   Procedure: Coronary Stent Intervention;  Surgeon: Troy Sine, MD;  Location: [place] CV LAB;  Service: Cardiovascular;  Laterality: N/A;  . CORONARY ARTERY BYPASS GRAFT  1997   LIMA to the LAD and vein to the RCA;  . CORONARY STENT PLACEMENT  12/02/2015   PAD  . DOPPLER ECHOCARDIOGRAPHY  01/09/12   LV NORMAL IN SIZE, NORMAL WAL THICKNESS, EF 50-55%; MILD POSTERIOR WALL HYOPKINESIS, THERE IS MILD INFERIOR WALL HYPOKINESIS  . HYPOTHENAR FAT PAD TRANSFER    .  LEFT HEART CATHETERIZATION WITH CORONARY/GRAFT ANGIOGRAM N/A 01/14/2012   Procedure: LEFT HEART CATHETERIZATION WITH Beatrix Fetters;  Surgeon: Troy Sine, MD;  Location: [facility] CATH LAB;  Service: Cardiovascular;  Laterality: N/A;  . LOWER ARTERIAL DOPPLER  01/24/12   ESSENTIALLY NORMAL RIGHT GROIN DUPLEX EVALUATION S/P THROMBIN INJECTION  . LOWER VENOUS DUPLEX  02/05/12   ESSENTIALLY NORMAL RIGHT LOWER EXTRIMTY VENOUS DUPLEX DOPPLER EVALUATION  . NM MYOVIEW LTD  01/09/12   HIGH RISK SCAN. COMPARED TO PREVIOUS STUDY, THERE IS NOW ISCHEMIA PRESENT. ABNORMAL MYOCARDIAL PERFUSION STUDY. THERE IS NEW MILD INFEROLATERAL ISCHEMIA TOWARDS THE BASE; PT TO FOLLOW UP WITH DR. [name]  . PERCUTANEOUS CORONARY STENT INTERVENTION (PCI-S) N/A 01/14/2012   Procedure: PERCUTANEOUS CORONARY STENT INTERVENTION (PCI-S);  Surgeon: Troy Sine, MD;  Location: [facility] CATH LAB;  Service: Cardiovascular;  Laterality: N/A;    Allergies  Allergen Reactions  . Diltiazem Other (See Comments)    Unknown  . Lovastatin Other (See Comments)    Unknown  . Proton Pump Inhibitors Other (See Comments)    Unknown   . Tramadol Nausea And Vomiting  . Codeine Rash    Current Outpatient Medications  Medication Sig Dispense Refill  . apixaban (ELIQUIS) 5 MG TABS tablet Take 1 tablet (5 mg total) by mouth 2 (two) times daily. 60 tablet 3  . atorvastatin (LIPITOR) 40 MG tablet Take 1 tablet (40 mg total) by mouth daily. 90 tablet 3  . benazepril (LOTENSIN) 20 MG tablet TAKE 1 TABLET(20 MG) BY MOUTH DAILY 90 tablet 3  . cholecalciferol (VITAMIN D) 1000 UNITS tablet Take 1,000 Units by mouth daily.    . hydrALAZINE (APRESOLINE) 25 MG tablet Take 1 tablet (25 mg total) by mouth 2 (two) times daily. 180 tablet 3  . insulin aspart (NOVOLOG) 100 UNIT/ML FlexPen Inject 12 Units into the skin 2 (two) times daily with a meal.     . isosorbide mononitrate (IMDUR) 60 MG 24 hr tablet Take 1.5 tablets (90 mg total) by mouth every morning AND  0.5 tablets (30 mg total) every evening. 180 tablet 3  . LEVEMIR FLEXTOUCH 100 UNIT/ML Pen Inject 42 Units into the skin 2 (two) times daily.  12  . LINZESS 145 MCG CAPS capsule TK 1 C PO D  11  . metFORMIN (GLUCOPHAGE-XR) 750 MG 24 hr tablet Take 750 mg by mouth daily with breakfast.    . metoprolol succinate (TOPROL-XL) 50 MG 24 hr tablet Take 75 mg (1.5 tablet) daily 135 tablet 3  . Multiple Vitamin (MULTIVITAMIN WITH MINERALS) TABS Take 1 tablet by mouth daily.    Marland Kitchen NOVOFINE AUTOCOVER 30G X 8 MM MISC     . ONE TOUCH ULTRA TEST test strip   4  . oxybutynin (DITROPAN) 5 MG tablet Take 1 tablet by mouth daily.    Marland Kitchen PREVNAR 13 SUSP injection   0  . ranitidine (ZANTAC) 150 MG tablet Take 150 mg by mouth at bedtime.     Marland Kitchen SYNTHROID 75 MCG tablet Take 1 tablet by mouth daily.     . TRULICITY 1.5 ER/7.4YC SOPN Inject 1.5 mg as directed once a week.   12  . vitamin C (ASCORBIC ACID) 500 MG tablet Take 500 mg by mouth daily.    . furosemide (LASIX) 40 MG tablet Take one tablet daily. may take one extra tablet daily for 3 days for swelling 100 tablet 3  . nitroGLYCERIN (NITROSTAT) 0.4 MG SL tablet Place 1 tablet (0.4 mg total) under the tongue every 5 (five) minutes as needed. For chest pain 25 tablet 12   No current facility-administered medications for this visit.     Socially, he is married has one child 2 grandchildren and 2 great-grandchildren. There is no tobacco or alcohol use.  ROS General: Negative; No fevers, chills, or night sweats;  HEENT: Positive for cataracts; No changes in hearing, sinus congestion, difficulty swallowing Pulmonary: Negative; No cough, wheezing, shortness of breath, hemoptysis Cardiovascular: See history of present illness;  GI: Positive for GERD on ranitidine; No nausea, vomiting, diarrhea, or abdominal pain GU: Negative; No dysuria, hematuria, or difficulty voiding Musculoskeletal: Negative; no myalgias, joint pain, or weakness Hematologic/Oncology: Negative;  no easy bruising, bleeding Endocrine: Positive for hypothyroidism, on Synthroid replacement; diabetes not well controlled Neuro: Negative; no changes in balance, headaches Skin: Negative; No rashes or skin lesions Psychiatric: Negative; No behavioral problems, depression Sleep: Negative; No snoring, daytime sleepiness, hypersomnolence, bruxism, restless legs, hypnogognic hallucinations, no cataplexy Other comprehensive 14 point system review is negative.  PE BP 122/78   Pulse 67   Ht '5\' 8"'  (1.727 m)   Wt 197 lb 11.2 oz (89.7 kg)   BMI 30.06 kg/m    Repeat blood pressure was 124/74  Wt Readings from Last 3 Encounters:  06/05/18 197 lb 11.2 oz (89.7 kg)  05/19/18 203 lb 12.8 oz (92.4 kg)  05/28/17 196 lb (88.9 kg)   General: Alert, oriented, no distress.  Skin: normal turgor, no rashes, warm and dry HEENT: Normocephalic, atraumatic. Pupils equal round and reactive to light; sclera anicteric; extraocular muscles intact;  Nose without nasal septal hypertrophy Mouth/Parynx benign; Mallinpatti scale 3 Neck: No JVD, no carotid bruits; normal carotid upstroke Lungs: clear to ausculatation and percussion; no wheezing or rales Chest wall: without tenderness to palpitation Heart: PMI not displaced, RRR, s1 s2 normal, 1/6 systolic murmur, no diastolic murmur, no rubs, gallops, thrills, or heaves Abdomen: soft, nontender; no hepatosplenomehaly, BS+; abdominal aorta nontender and not dilated by palpation. Back: no CVA tenderness Pulses 2+ Musculoskeletal: full range of motion, normal strength, no joint deformities Extremities: Significant improvement in previous 2+ pitting edema now mild with support stockings;no clubbing cyanosis or edema, Homan's sign negative  Neurologic: grossly nonfocal; Cranial nerves grossly wnl Psychologic: Normal mood and affect   ECG (independently read by me): Atrial fibrillation at 67 bpm.  QTc interval 456 ms.  No significant ST changes.  Q waves in lead  III  May 19, 2018 ECG (independently read by me): Atrial Fibrillation at 69 ; NS t changes   October 2018 ECG (independently read by me): Sinus bradycardia 57 bpm.  Q wave in lead 3.  Nondiagnostic and aVF.  April 2018 ECG (independently read by me): Normal sinus rhythm at 61 bpm.  First-degree AV block with a PR interval 206 ms.  Q wave in 3 and aVF.  June 2017 ECG (independently read by me): Sinus bradycardia 58 bpm.  Inferior Q waves.  Normal intervals.  No ST segment changes.  May 2017 ECG (independently read by me): Sinus bradycardia 59 bpm.  LVH by voltage criteria.  September 2016 ECG (independently read by me):  Normal sinus rhythm at 65 bpm. Mild voltage for LVH.  01/13/2015 ECG (independently read by me): Sinus bradycardia 58 bpm.  Normal intervals.  October 2015 ECG (independent read by me): Sinus bradycardia at 50 bpm.  No ectopy.  PR interval 196 msec,   Borderline LVH.  Prior June 2015 ECG (independently read by me): Sinus bradycardia 52 beats per minute.  Borderline first degree AV block with a PR interval of 200 ms.  December 2014 ECG (independently read by me): Sinus rhythm at 54 beats per minute. No ectopy. Normal intervals.  ECG from 07/08/2013 : reveals sinus rhythm at 57 beats per minute. QTc interval 453 ms.  LABS: BMP Latest Ref Rng & Units 06/02/2018 12/03/2015 11/25/2015  Glucose 65 - 99 mg/dL 137(H) 110(H) 174(H)  BUN 8 - 27 mg/dL '24 16 20  ' Creatinine 0.76 - 1.27 mg/dL 1.57(H) 1.22 1.46(H)  BUN/Creat Ratio 10 - 24 15 - -  Sodium 134 - 144 mmol/L 142 141 140  Potassium 3.5 - 5.2 mmol/L 4.1 3.3(L) 4.3  Chloride 96 - 106 mmol/L 104 108 103  CO2 20 - 29 mmol/L '28 22 29  ' Calcium 8.6 - 10.2 mg/dL 9.5 8.7(L) 9.1   Hepatic Function Latest Ref Rng & Units 06/02/2018 11/25/2015  Total Protein 6.0 - 8.5 g/dL 6.6 7.1  Albumin 3.5 - 4.8 g/dL 4.2 4.3  AST 0 - 40 IU/L 24 15  ALT 0 - 44 IU/L 31 16  Alk Phosphatase 39 - 117 IU/L 66 58  Total Bilirubin 0.0 - 1.2  mg/dL 0.8 0.6   CBC Latest Ref Rng & Units 06/02/2018 12/03/2015 11/25/2015  WBC 3.4 - 10.8 x10E3/uL 7.8 9.3 8.6  Hemoglobin 13.0 - 17.7 g/dL 11.8(L) 12.2(L) 14.2  Hematocrit 37.5 - 51.0 % 34.7(L) 36.9(L) 41.9  Platelets 150 - 450 x10E3/uL 189 187 239   Lab Results  Component Value Date   MCV 88 06/02/2018   MCV 90.0 12/03/2015   MCV 89.9 11/25/2015   Lab Results  Component Value Date   TSH 1.890 06/02/2018     Lab Results  Component Value Date   HGBA1C 10.7 (A) 11/01/2014   Lipid Panel      Component Value Date/Time   CHOL 97 (L) 06/02/2018 0811   TRIG 78 06/02/2018 0811   HDL 34 (L) 06/02/2018 0811   CHOLHDL 2.9 06/02/2018 0811   LDLCALC 47 06/02/2018 0811    RADIOLOGY: No results found.  IMPRESSION:  1. Persistent atrial fibrillation   2. Current use of anticoagulant therapy   3. CAD in  native artery   4. Hx of CABG   5. Hyperlipidemia LDL goal <70   6. Stage 3 chronic kidney disease ([place])   7. Essential hypertension   8. Lower extremity edema     ASSESSMENT AND PLAN:  Roberto. Diemer is a 77 year old white male who is status post CABG revascularization surgery in 1997 and has a documented occluded LIMA. He has previously undergone intervention both to his proximal LAD extending into the left main with stenting and stenting of his mid LAD. In June 2013 he developed severe in-stent restenosis of his LAD stent and a DES stent was in place at that time. We also went through the stent struts in the left main and performed PTCA of the distal circumflex. He had mild/moderate disease in his PDA after his SVG to his PDA insertion.   A nuclear perfusion study in June 2014 after experiencing some vague episodes of chest pain was low risk and showed distal anteroseptal scar with the extent of 10% without associated ischemia.  His nuclear study in April 2017 revealed a large new defect inferolaterally with scar/ischemia that was not present in 2014.  He underwent cardiac  catheterization with findings as noted above.  His ischemia was secondary to new high-grade stenosis in the mid PDA and distal PDA for which he underwent successful stenting of the 95% stenosis which was reduced to 0% and PTCA of the very distal 75% stenosis reduced to 0%.  He was enrolled in the research trial and received a polymer free BioFreedom stent.  His aspirin was subsequently to been stopped and he was  on Plavix 75 mg daily.  He has not had any bleeding or GI issues.  He had recently developed significant lower extremity edema necessitating reduction of amlodipine.  His ECG of May 19, 2018 showed atrial fibrillation of questionable duration.  Since that evaluation amlodipine has been discontinued.  Plavix was discontinued and he was started on Eliquis.  He has tolerated this well without bleeding.  His echo was reviewed and showed an EF of 45 to 50%.  His left atrium was only mildly dilated at 45 mm.  ECG today confirms persistent atrial fibrillation.  His blood pressure is improved with the addition of hydralazine.  I will now titrate Toprol to 75 mg daily.  In 3 to 4 weeks he will have a CBC, bmet, magnesium level and follow-up office visit.  If he is still in atrial fibrillation plans will be made to undergo cardioversion.  His lower extremity edema has significantly improved with furosemide.  He has renal insufficiency with a creatinine of 1.57.  He continues to be on Trulicity and metformin for diabetes mellitus in addition to insulin.  He is on atorvastatin for hyperlipidemia and on June 02, 2018 LDL cholesterol was excellent at 47.  See him in 4 weeks for reevaluation  I spent: 25 minutes  Troy Sine, MD, [facility]  06/07/2018 10:45 AM

## 2018-06-05 NOTE — Patient Instructions (Signed)
Medication Instructions:  INCREASE metoprolol succinate (Toprol XL) to 75 mg (1.5 tablet) daily  If you need a refill on your cardiac medications before your next appointment, please call your pharmacy.   Lab work: Please return for labs in 3-4 weeks *prior to appt 12/3) (BMET, CBC, Mag)  Our in office lab hours are Monday-Friday 8:00-4:00, closed for lunch 12:45-1:45 pm.  No appointment needed.;a If you have labs (blood work) drawn today and your tests are completely normal, you will receive your results only by: Marland Kitchen MyChart Message (if you have MyChart) OR . A paper copy in the mail If you have any lab test that is abnormal or we need to change your treatment, we will call you to review the results.   Follow-Up: Tuesday 12/3 at 3:20 pm with Dr. Claiborne Billings

## 2018-06-06 ENCOUNTER — Ambulatory Visit: Payer: Medicare Other | Admitting: Cardiovascular Disease

## 2018-06-07 ENCOUNTER — Encounter: Payer: Self-pay | Admitting: Cardiovascular Disease

## 2018-06-30 DIAGNOSIS — I129 Hypertensive chronic kidney disease with stage 1 through stage 4 chronic kidney disease, or unspecified chronic kidney disease: Secondary | ICD-10-CM | POA: Diagnosis not present

## 2018-06-30 DIAGNOSIS — E039 Hypothyroidism, unspecified: Secondary | ICD-10-CM | POA: Diagnosis not present

## 2018-06-30 DIAGNOSIS — Z23 Encounter for immunization: Secondary | ICD-10-CM | POA: Diagnosis not present

## 2018-06-30 DIAGNOSIS — E785 Hyperlipidemia, unspecified: Secondary | ICD-10-CM | POA: Diagnosis not present

## 2018-06-30 DIAGNOSIS — Z125 Encounter for screening for malignant neoplasm of prostate: Secondary | ICD-10-CM | POA: Diagnosis not present

## 2018-06-30 DIAGNOSIS — E1129 Type 2 diabetes mellitus with other diabetic kidney complication: Secondary | ICD-10-CM | POA: Diagnosis not present

## 2018-07-04 ENCOUNTER — Other Ambulatory Visit: Payer: Self-pay | Admitting: Cardiovascular Disease

## 2018-07-08 ENCOUNTER — Ambulatory Visit (INDEPENDENT_AMBULATORY_CARE_PROVIDER_SITE_OTHER): Payer: Medicare Other | Admitting: Cardiovascular Disease

## 2018-07-08 ENCOUNTER — Encounter: Payer: Self-pay | Admitting: Cardiovascular Disease

## 2018-07-08 VITALS — BP 149/72 | HR 73 | Ht 68.0 in | Wt 197.0 lb

## 2018-07-08 DIAGNOSIS — I4819 Other persistent atrial fibrillation: Secondary | ICD-10-CM | POA: Diagnosis not present

## 2018-07-08 DIAGNOSIS — Z951 Presence of aortocoronary bypass graft: Secondary | ICD-10-CM

## 2018-07-08 DIAGNOSIS — N183 Chronic kidney disease, stage 3 unspecified: Secondary | ICD-10-CM

## 2018-07-08 DIAGNOSIS — I10 Essential (primary) hypertension: Secondary | ICD-10-CM | POA: Diagnosis not present

## 2018-07-08 DIAGNOSIS — E785 Hyperlipidemia, unspecified: Secondary | ICD-10-CM

## 2018-07-08 DIAGNOSIS — Z7901 Long term (current) use of anticoagulants: Secondary | ICD-10-CM | POA: Diagnosis not present

## 2018-07-08 DIAGNOSIS — I2510 Atherosclerotic heart disease of native coronary artery without angina pectoris: Secondary | ICD-10-CM | POA: Diagnosis not present

## 2018-07-08 MED ORDER — AMIODARONE HCL 200 MG PO TABS: ORAL_TABLET | ORAL | 3 refills | Status: DC

## 2018-07-08 MED ORDER — METOPROLOL SUCCINATE ER 50 MG PO TB24: ORAL_TABLET | ORAL | 3 refills | Status: DC

## 2018-07-08 NOTE — Patient Instructions (Signed)
Medication Instructions:  START amiodarone 200 mg (1 tablet) daily for 2 weeks  In 2 weeks: INCREASE amiodarone to 200 mg (1 tablet) two times daily AND DECREASE metoprolol succinate (Toprol XL) to 50 mg (1 tablet) daily  If you need a refill on your cardiac medications before your next appointment, please call your pharmacy.   Follow-Up: At [facility], you and your health needs are our priority.  As part of our continuing mission to provide you with exceptional heart care, we have created designated Provider Care Teams.  These Care Teams include your primary Cardiologist (physician) and Advanced Practice Providers (APPs -  Physician Assistants and Nurse Practitioners) who all work together to provide you with the care you need, when you need it. . 1/13 at 4 pm with Dr. Claiborne Billings

## 2018-07-08 NOTE — Progress Notes (Signed)
Patient ID: Roberto Haley, male   DOB: June 30, 1941, 77 y.o.   MRN: [id number]     Primary M.D.: Dr. Nelda Haley  HPI: Roberto Haley is a 77 y.o. male who presents to the office for a 6 week cardiology followup evaluation.   Roberto Haley has known CAD and underwent CABG revascularization surgery in 1997 with LIMA to LAD, vein to the RCA. In March 1998 he underwent complex intervention to his native LAD and high speed rotational atherectomy (HSRA) and stenting of his LAD and PTCA of his diagonal vessel after he was found to have stenosis in the LIMA graft. In June 2013 due to 99% in-stent restenosis in the LAD stent and a new stenosis of 90% the distal circumflex, he underwent two-vessel intervention with insertion of a 2.75x22 mm resident stent in the LAD and PTCA of the distal circumflex via his previously placed stent which started in the mid left main coronary artery. A cardiopulmonary met test showed reduced maximum oxygen consumption. I  reduced his beta blocker therapy due to significant chronotropic incompetence and further titrated his Ranexa to 1000 mg twice a day.  A nuclear perfusion study in June 2014 showed distal anteroseptal scar without significant ischemia. He did have PACs and PVCs in gating was not done.  He fell on the Fourth of July 2014 and landed on his head. He ultimately was transferred to [facility] where was hospitalized for a week. He did not have neurosurgery. He was told of having "blood on his brain."  He did have a left-sided per hemiparesis. He then spent 2 months at [facility] rehabilitation unit and has spent additional time a universal rehabilitation in [place] until October 31.  He is significant improved following his head trauma with return of his memory.  Last year, I reduced his beta blocker therapy due to bradycardia.  He underwent a nuclear perfusion study on 11/22/2015 which now showed an ejection fraction of 42% with inferolateral hypokinesis.   There was a new large defect in the inferolateral wall and there was no change in his previous anteroseptal defect.  There was concern for scar/ischemia inferolaterally which was new.   He underwent cardiac catheterization by me on 04/28/2017and was found to have significant native CAD with a patent stent extending from the mid left main into the proximal LAD, 50% narrowings in the LAD after the first diagonal vessel and widely patent mid LAD stent.  There was pain PTCA sites in the left circumflex proximally at the mid segment with 50% stenosis a small OM branch.  There was new total occlusion of his proximal native RCA with faint bridging collaterals and faint collateralization to the distal branch of the RCA which had not been supplied by the vein graft.  He had previously documented occluded LIMA graft.  The vein graft supplying the PDA and PLA vessel was patent but he had new distal disease with 95% stenosis in the mid PDA and very distal 75% stenosis.  He underwent successful intervention to his PDA and due to his prior history of head trauma and CNS bleed.  He underwent insertion of a research BioFreedom stent and after  30 days was transitioned to only single anti platelet therapy rather than DAPT.  Since undergoing his PCI to his mid PDA he has noted marked improvement in his ability to be active.  He continues to participate in cardiac rehabilitation and often walks 1 mile in the morning before breakfast and feels well.  He has noticed occasional angina episodes of angina, particularly when he is tired.    In June 2017 he had a brief episode of PAF, alt doing cardiac rehabilitation.  He states that he has not been sleeping very well.  He has not had any GI issues, but he has only had one colonoscopy in his life and he is 77 years old.  He is seen by Dr. Lyda Haley in [place] and was scheduled for screening colonoscopy and EGD.  When I last saw him, he had noticed some vague exertional chest  discomfort which did not occur during walks in the early morning, but he had noticed it more at the end of the day.  I further titrated his isosorbide and increase this to 90 mg in the morning and 30 mg at night.  He has continued Toprol-XL for beta blocker therapy.  I referred him for a nuclear perfusion study which was done on 10/18/2016.  This showed a calculated EF of 48% but visually it appeared to be 55%.  There was mild possible scar as well as a component of diaphragmatic attenuation but a small area of ischemia could not be completely excluded apically.   When I last saw him in October 2018 he was doing well from a cardiac standpoint and was walking 1-1/2-2 miles per day in the morning and 3 days per week also going to cardiac rehabilitation.  He denied chest pain, PND, orthopnea.  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 Laboratory by Dr. Eden Haley.: Glucose was 163.  Creatinine had increased to 1.61.  Lipids were excellent with a total cholesterol 107, triglycerides 71, HDL 41, and LDL 52.  Hemoglobin A1c was 8.0.   He has been caring for his wife who has developed Alzheimer's disease.  As result he is her primary caregiver.  He is not walking as often.  Over the past month he has noticed some increased leg swelling and saw Dr. Delena Haley last week.  At that time, his amlodipine was reduced from 10 mg down to 5 mg and he was given a prescription for furosemide.  I saw him on May 19, 2018 for one-year evaluation.  At that time, he had noticed some irregularity to his heart rhythm.  He was having lower extremity edema.  His ECG showed atrial fibrillation which was new and of questionable duration.  I discontinued amlodipine, increase furosemide to 40 mg twice a day for 3 days and added hydralazine 25 mg twice a day for his blood pressure.  I initiated Eliquis 5 mg twice a day for anticoagulation and recommended he discontinue Plavix.  Underwent an echo Doppler study on May 28, 2017 which  showed an EF of 45 to 50%.  Mild Roberto and mild TR.  RV systolic function was mildly reduced.  Laboratory on June 02, 2018 revealed a magnesium of 1.9.  Creatinine had increased to 1.57.    I saw him in follow-up 2 weeks later on June 05, 2018.  He was still in atrial fibrillation at 67 bpm.  He underwent an echo Doppler study on May 28, 2018 which showed an EF of 45 to 50%.  There was moderate aortic sclerosis with trivial AR.  There was mild Roberto.  Of note, his left atrium was only mildly dilated.  There was mild TR.  His ECG confirmed persistent atrial fibrillation.  His blood pressure was improved with the addition of hydralazine.  During that office visit I titrated Toprol to 75 mg daily.  Presently, he feels well.  He walked on a treadmill today and walked 2 miles without symptoms.  He denies any tachypalpitations, presyncope or syncope.  He presents for reevaluation.  Past Medical History:  Diagnosis Date  . Arthritis   . Chronic kidney disease    CKD STAGE 3;  CHRONIC RENAL INSUFFICIENCY  . Coronary artery disease   . Diabetes mellitus   . Groin hematoma 02/19/12   RIGHT  . Hyperlipidemia   . Hypertension   . Neuropathy due to type 2 diabetes mellitus ([place])   . Obesity   . PVC (premature ventricular contraction)   . SDH (subdural hematoma) ([place]) 02/06/2013   R parafalcine SDH after a fall, rx at [facility]    Past Surgical History:  Procedure Laterality Date  . CARDIAC CATHETERIZATION  01/14/12   LV FXN EF 50-55% W/MILD DISTAL INFERIOR HYPOKINESIS; PCI: LAD PTCA/STENT W/NEW 2.75X22 RESOLUTE DES IN MID LAD POST DIALTED TO 3.08 TO 2.97 TAPER: 99%-80% TO 0; LCX: PTCA VIA LM STENT W/2.25X12 SPRINTER BALLOON: 90%-5; LAD: PATENT PROXIMAL STENT EXTENDING INTO LM W/30-40% SMOOTH NARROWING IN DISTAL PORTION OF STENT; 99% IN STENT RESTENOSIS OF MID LAD STENT   . CARDIAC CATHETERIZATION N/A 12/02/2015   Procedure: Left Heart Cath and Coronary Angiography;  Surgeon: Troy Sine, MD;  Location:  [place] [facility];  Service: Cardiovascular;  Laterality: N/A;  . CARDIAC CATHETERIZATION N/A 12/02/2015   Procedure: Coronary Stent Intervention;  Surgeon: Troy Sine, MD;  Location: [facility];  Service: Cardiovascular;  Laterality: N/A;  . CORONARY ARTERY BYPASS GRAFT  1997   LIMA to the LAD and vein to the RCA;  . CORONARY STENT PLACEMENT  12/02/2015   PAD  . DOPPLER ECHOCARDIOGRAPHY  01/09/12   LV NORMAL IN SIZE, NORMAL WAL THICKNESS, EF 50-55%; MILD POSTERIOR WALL HYOPKINESIS, THERE IS MILD INFERIOR WALL HYPOKINESIS  . HYPOTHENAR FAT PAD TRANSFER    . LEFT HEART CATHETERIZATION WITH CORONARY/GRAFT ANGIOGRAM N/A 01/14/2012   Procedure: LEFT HEART CATHETERIZATION WITH Beatrix Fetters;  Surgeon: Troy Sine, MD;  Location: [facility] CATH LAB;  Service: Cardiovascular;  Laterality: N/A;  . LOWER ARTERIAL DOPPLER  01/24/12   ESSENTIALLY NORMAL RIGHT GROIN DUPLEX EVALUATION S/P THROMBIN INJECTION  . LOWER VENOUS DUPLEX  02/05/12   ESSENTIALLY NORMAL RIGHT LOWER EXTRIMTY VENOUS DUPLEX DOPPLER EVALUATION  . NM MYOVIEW LTD  01/09/12   HIGH RISK SCAN. COMPARED TO PREVIOUS STUDY, THERE IS NOW ISCHEMIA PRESENT. ABNORMAL MYOCARDIAL PERFUSION STUDY. THERE IS NEW MILD INFEROLATERAL ISCHEMIA TOWARDS THE BASE; PT TO FOLLOW UP WITH DR. [name]  . PERCUTANEOUS CORONARY STENT INTERVENTION (PCI-S) N/A 01/14/2012   Procedure: PERCUTANEOUS CORONARY STENT INTERVENTION (PCI-S);  Surgeon: Troy Sine, MD;  Location: [facility] C[facility] LAB;  Service: Cardiovascular;  Laterality: N/A;    Allergies  Allergen Reactions  . Diltiazem Other (See Comments)    Unknown  . Lovastatin Other (See Comments)    Unknown  . Proton Pump Inhibitors Other (See Comments)    Unknown   . Tramadol Nausea And Vomiting  . Codeine Rash    Current Outpatient Medications  Medication Sig Dispense Refill  . apixaban (ELIQUIS) 5 MG TABS tablet Take 1 tablet (5 mg total) by mouth 2 (two) times daily. 60 tablet 3  . atorvastatin  (LIPITOR) 40 MG tablet Take 1 tablet (40 mg total) by mouth daily. 90 tablet 3  . benazepril (LOTENSIN) 20 MG tablet TAKE 1 TABLET(20 MG) BY MOUTH DAILY 90 tablet 3  .  cholecalciferol (VITAMIN D) 1000 UNITS tablet Take 1,000 Units by mouth daily.    . furosemide (LASIX) 40 MG tablet Take one tablet daily. may take one extra tablet daily for 3 days for swelling 100 tablet 3  . hydrALAZINE (APRESOLINE) 25 MG tablet Take 1 tablet (25 mg total) by mouth 2 (two) times daily. 180 tablet 3  . insulin aspart (NOVOLOG) 100 UNIT/ML FlexPen Inject 12 Units into the skin 2 (two) times daily with a meal.     . isosorbide mononitrate (IMDUR) 60 MG 24 hr tablet Take 1.5 tablets (90 mg total) by mouth every morning AND 0.5 tablets (30 mg total) every evening. 180 tablet 3  . LEVEMIR FLEXTOUCH 100 UNIT/ML Pen Inject 42 Units into the skin 2 (two) times daily.   12  . LINZESS 145 MCG CAPS capsule TK 1 C PO D  11  . metFORMIN (GLUCOPHAGE-XR) 750 MG 24 hr tablet Take 750 mg by mouth daily with breakfast.    . metoprolol succinate (TOPROL-XL) 50 MG 24 hr tablet Take 75 mg (1.5 tablet) daily-Decrease to 50 mg daily in 2 weeks 135 tablet 3  . Multiple Vitamin (MULTIVITAMIN WITH MINERALS) TABS Take 1 tablet by mouth daily.    . nitroGLYCERIN (NITROSTAT) 0.4 MG SL tablet Place 1 tablet (0.4 mg total) under the tongue every 5 (five) minutes as needed. For chest pain 25 tablet 12  . NOVOFINE AUTOCOVER 30G X 8 MM MISC     . ONE TOUCH ULTRA TEST test strip   4  . oxybutynin (DITROPAN) 5 MG tablet Take 1 tablet by mouth daily.    Marland Kitchen PREVNAR 13 SUSP injection   0  . ranitidine (ZANTAC) 150 MG tablet Take 150 mg by mouth at bedtime.     Marland Kitchen SYNTHROID 75 MCG tablet Take 1 tablet by mouth daily.     . TRULICITY 1.5 YC/1.4GY SOPN Inject 1.5 mg as directed once a week.   12  . vitamin C (ASCORBIC ACID) 500 MG tablet Take 500 mg by mouth daily.    Marland Kitchen amiodarone (PACERONE) 200 MG tablet Take 200 mg daily x 2 weeks, then increase to 200  mg two times daily 60 tablet 3   No current facility-administered medications for this visit.     Socially, he is married has one child 2 grandchildren and 2 great-grandchildren. There is no tobacco or alcohol use.  ROS General: Negative; No fevers, chills, or night sweats;  HEENT: Positive for cataracts; No changes in hearing, sinus congestion, difficulty swallowing Pulmonary: Negative; No cough, wheezing, shortness of breath, hemoptysis Cardiovascular: See history of present illness;  GI: Positive for GERD on ranitidine; No nausea, vomiting, diarrhea, or abdominal pain GU: Negative; No dysuria, hematuria, or difficulty voiding Musculoskeletal: Negative; no myalgias, joint pain, or weakness Hematologic/Oncology: Negative; no easy bruising, bleeding Endocrine: Positive for hypothyroidism, on Synthroid replacement; diabetes not well controlled Neuro: Negative; no changes in balance, headaches Skin: Negative; No rashes or skin lesions Psychiatric: Negative; No behavioral problems, depression Sleep: Negative; No snoring, daytime sleepiness, hypersomnolence, bruxism, restless legs, hypnogognic hallucinations, no cataplexy Other comprehensive 14 point system review is negative.  PE BP (!) 149/72   Pulse 73   Ht '5\' 8"'  (1.727 m)   Wt 197 lb (89.4 kg)   BMI 29.95 kg/m    Repeat blood pressure by me was 145/70  Wt Readings from Last 3 Encounters:  07/08/18 197 lb (89.4 kg)  06/05/18 197 lb 11.2 oz (89.7 kg)  05/19/18 203  lb 12.8 oz (92.4 kg)   General: Alert, oriented, no distress.  Skin: normal turgor, no rashes, warm and dry HEENT: Normocephalic, atraumatic. Pupils equal round and reactive to light; sclera anicteric; extraocular muscles intact;  Nose without nasal septal hypertrophy Mouth/Parynx benign; Mallinpatti scale Neck: No JVD, no carotid bruits; normal carotid upstroke Lungs: clear to ausculatation and percussion; no wheezing or rales Chest wall: without tenderness to  palpitation Heart: PMI not displaced, irregularly irregular with a controlled ventricular rate in the 70s., s1 s2 normal, 1/6 systolic murmur, no diastolic murmur, no rubs, gallops, thrills, or heaves Abdomen: soft, nontender; no hepatosplenomehaly, BS+; abdominal aorta nontender and not dilated by palpation. Back: no CVA tenderness Pulses 2+ Musculoskeletal: full range of motion, normal strength, no joint deformities Extremities: no clubbing cyanosis or edema, Homan's sign negative  Neurologic: grossly nonfocal; Cranial nerves grossly wnl Psychologic: Normal mood and affect   ECG (independently read by me): Atrial  fibrillation at 73 bpm.  Inferior Q waves lead III and aVF.  QTc interval 449 ms.  June 05, 2018 ECG (independently read by me): Atrial fibrillation at 67 bpm.  QTc interval 456 ms.  No significant ST changes.  Q waves in lead III  May 19, 2018 ECG (independently read by me): Atrial Fibrillation at 69 ; NS t changes   October 2018 ECG (independently read by me): Sinus bradycardia 57 bpm.  Q wave in lead 3.  Nondiagnostic and aVF.  April 2018 ECG (independently read by me): Normal sinus rhythm at 61 bpm.  First-degree AV block with a PR interval 206 ms.  Q wave in 3 and aVF.  June 2017 ECG (independently read by me): Sinus bradycardia 58 bpm.  Inferior Q waves.  Normal intervals.  No ST segment changes.  May 2017 ECG (independently read by me): Sinus bradycardia 59 bpm.  LVH by voltage criteria.  September 2016 ECG (independently read by me):  Normal sinus rhythm at 65 bpm. Mild voltage for LVH.  01/13/2015 ECG (independently read by me): Sinus bradycardia 58 bpm.  Normal intervals.  October 2015 ECG (independent read by me): Sinus bradycardia at 50 bpm.  No ectopy.  PR interval 196 msec,   Borderline LVH.  Prior June 2015 ECG (independently read by me): Sinus bradycardia 52 beats per minute.  Borderline first degree AV block with a PR interval of 200 ms.  December  2014 ECG (independently read by me): Sinus rhythm at 54 beats per minute. No ectopy. Normal intervals.  ECG from 07/08/2013 : reveals sinus rhythm at 57 beats per minute. QTc interval 453 ms.  LABS: BMP Latest Ref Rng & Units 06/02/2018 12/03/2015 11/25/2015  Glucose 65 - 99 mg/dL 137(H) 110(H) 174(H)  BUN 8 - 27 mg/dL '24 16 20  ' Creatinine 0.76 - 1.27 mg/dL 1.57(H) 1.22 1.46(H)  BUN/Creat Ratio 10 - 24 15 - -  Sodium 134 - 144 mmol/L 142 141 140  Potassium 3.5 - 5.2 mmol/L 4.1 3.3(L) 4.3  Chloride 96 - 106 mmol/L 104 108 103  CO2 20 - 29 mmol/L '28 22 29  ' Calcium 8.6 - 10.2 mg/dL 9.5 8.7(L) 9.1   Hepatic Function Latest Ref Rng & Units 06/02/2018 11/25/2015  Total Protein 6.0 - 8.5 g/dL 6.6 7.1  Albumin 3.5 - 4.8 g/dL 4.2 4.3  AST 0 - 40 IU/L 24 15  ALT 0 - 44 IU/L 31 16  Alk Phosphatase 39 - 117 IU/L 66 58  Total Bilirubin 0.0 - 1.2 mg/dL 0.8 0.6  CBC Latest Ref Rng & Units 06/02/2018 12/03/2015 11/25/2015  WBC 3.4 - 10.8 x10E3/uL 7.8 9.3 8.6  Hemoglobin 13.0 - 17.7 g/dL 11.8(L) 12.2(L) 14.2  Hematocrit 37.5 - 51.0 % 34.7(L) 36.9(L) 41.9  Platelets 150 - 450 x10E3/uL 189 187 239   Lab Results  Component Value Date   MCV 88 06/02/2018   MCV 90.0 12/03/2015   MCV 89.9 11/25/2015   Lab Results  Component Value Date   TSH 1.890 06/02/2018     Lab Results  Component Value Date   HGBA1C 10.7 (A) 11/01/2014   Lipid Panel      Component Value Date/Time   CHOL 97 (L) 06/02/2018 0811   TRIG 78 06/02/2018 0811   HDL 34 (L) 06/02/2018 0811   CHOLHDL 2.9 06/02/2018 0811   LDLCALC 47 06/02/2018 0811    RADIOLOGY: No results found.  IMPRESSION:  1. Persistent atrial fibrillation   2. Current use of anticoagulant therapy   3. CAD in native artery   4. Hx of CABG   5. Hyperlipidemia LDL goal <70   6. Essential hypertension   7. Stage 3 chronic kidney disease (HCC)     ASSESSMENT AND PLAN:  Roberto Haley is a 77 year old white male who is status post CABG  revascularization surgery in 1997 and has a documented occluded LIMA. He has previously undergone intervention both to his proximal LAD extending into the left main with stenting and stenting of his mid LAD. In June 2013 he developed severe in-stent restenosis of his LAD stent and a DES stent was in place at that time. We also went through the stent struts in the left main and performed PTCA of the distal circumflex. He had mild/moderate disease in his PDA after his SVG to his PDA insertion.   A nuclear perfusion study in June 2014 after experiencing some vague episodes of chest pain was low risk and showed distal anteroseptal scar with the extent of 10% without associated ischemia.  His nuclear study in April 2017 revealed a large new defect inferolaterally with scar/ischemia that was not present in 2014.  He underwent cardiac catheterization with findings as noted above.  His ischemia was secondary to new high-grade stenosis in the mid PDA and distal PDA for which he underwent successful stenting of the 95% stenosis which was reduced to 0% and PTCA of the very distal 75% stenosis reduced to 0%.  He was enrolled in the research trial and received a polymer free BioFreedom stent.  His aspirin was subsequently to been stopped and he was  Started on Plavix 75 mg daily.  He has not had any bleeding or GI issues.  He had  developed significant lower extremity edema necessitating reduction of amlodipine.  His ECG of May 19, 2018 showed atrial fibrillation of questionable duration.  Since that evaluation amlodipine has been discontinued.  Plavix was discontinued and he was started on Eliquis.  He has tolerated this well without bleeding.  I reviewed his echo which showed an EF of 45 to 50%.  His left atrium was only mildly dilated at 45 mm.  When I saw him 6 weeks ago I further titrated Toprol-XL to 75 mg daily.  His blood pressure had improved with hydralazine.  His blood pressure today is stable on benazepril 20 mg,  furosemide 40 mg, hydralazine 25 mg twice a day, isosorbide 90 mg in the morning and 30 mg in the evening in addition to his Toprol-XL 75 mg daily.  He has stage III  kidney disease with most recent creatinine 1.57.  His ECG confirms persistent atrial fibrillation with adequate rate control.  With his left atrium only being mildly dilated, I have suggested a trial of amiodarone to see if this can be helpful to pharmacologically cardiovert him or if unsuccessful by itself planned cardioversion with hopeful improved stability of maintaining sinus rhythm with concurrent treatment.  I have suggested he take amiodarone 200 mg daily for the next 2 weeks.  After 2 weeks he will decrease Toprol-XL down to 50 mg and further titrate amiodarone to 200 mg twice a day.  I will see him in 4 to 6 weeks for reevaluation and plans will be made for DC cardioversion at that time if he is still in atrial fibrillation.  Time spent 25 minutes  Troy Sine, MD, [facility]  07/10/2018 6:16 PM

## 2018-07-10 ENCOUNTER — Encounter: Payer: Self-pay | Admitting: Cardiovascular Disease

## 2018-08-18 ENCOUNTER — Encounter: Payer: Self-pay | Admitting: Cardiovascular Disease

## 2018-08-18 ENCOUNTER — Ambulatory Visit (INDEPENDENT_AMBULATORY_CARE_PROVIDER_SITE_OTHER): Payer: Medicare Other | Admitting: Cardiovascular Disease

## 2018-08-18 VITALS — BP 154/72 | HR 55 | Ht 68.0 in | Wt 195.4 lb

## 2018-08-18 DIAGNOSIS — I2510 Atherosclerotic heart disease of native coronary artery without angina pectoris: Secondary | ICD-10-CM | POA: Diagnosis not present

## 2018-08-18 DIAGNOSIS — N183 Chronic kidney disease, stage 3 unspecified: Secondary | ICD-10-CM

## 2018-08-18 DIAGNOSIS — E1142 Type 2 diabetes mellitus with diabetic polyneuropathy: Secondary | ICD-10-CM

## 2018-08-18 DIAGNOSIS — I4819 Other persistent atrial fibrillation: Secondary | ICD-10-CM | POA: Diagnosis not present

## 2018-08-18 DIAGNOSIS — Z951 Presence of aortocoronary bypass graft: Secondary | ICD-10-CM | POA: Diagnosis not present

## 2018-08-18 DIAGNOSIS — E039 Hypothyroidism, unspecified: Secondary | ICD-10-CM

## 2018-08-18 DIAGNOSIS — Z7901 Long term (current) use of anticoagulants: Secondary | ICD-10-CM | POA: Diagnosis not present

## 2018-08-18 MED ORDER — METOPROLOL SUCCINATE ER 25 MG PO TB24: 25.0000 mg | ORAL_TABLET | Freq: Every day | ORAL | 1 refills | Status: DC

## 2018-08-18 NOTE — Progress Notes (Signed)
Patient ID: AISON Haley, male   DOB: 08/03/1941, 78 y.o.   MRN: [id number]     Primary M.D.: Dr. Nelda Bucks  HPI: [name] is a 78 y.o. male who presents to the office for a 5 week cardiology followup evaluation.   Mr Roberto Haley has known CAD and underwent CABG revascularization surgery in 1997 with LIMA to LAD, vein to the RCA. In March 1998 he underwent complex intervention to his native LAD and high speed rotational atherectomy (HSRA) and stenting of his LAD and PTCA of his diagonal vessel after he was found to have stenosis in the LIMA graft. In June 2013 due to 99% in-stent restenosis in the LAD stent and a new stenosis of 90% the distal circumflex, he underwent two-vessel intervention with insertion of a 2.75x22 mm resident stent in the LAD and PTCA of the distal circumflex via his previously placed stent which started in the mid left main coronary artery. A cardiopulmonary met test showed reduced maximum oxygen consumption. I  reduced his beta blocker therapy due to significant chronotropic incompetence and further titrated his Ranexa to 1000 mg twice a day.  A nuclear perfusion study in June 2014 showed distal anteroseptal scar without significant ischemia. He did have PACs and PVCs in gating was not done.  He fell on the Fourth of July 2014 and landed on his head. He ultimately was transferred to [facility] where was hospitalized for a week. He did not have neurosurgery. He was told of having "blood on his brain."  He did have a left-sided per hemiparesis. He then spent 2 months at [facility] rehabilitation unit and has spent additional time a universal rehabilitation in [place] until October 31.  He is significant improved following his head trauma with return of his memory.  Last year, I reduced his beta blocker therapy due to bradycardia.  He underwent a nuclear perfusion study on 11/22/2015 which now showed an ejection fraction of 42% with inferolateral hypokinesis.   There was a new large defect in the inferolateral wall and there was no change in his previous anteroseptal defect.  There was concern for scar/ischemia inferolaterally which was new.   He underwent cardiac catheterization by me on 04/28/2017and was found to have significant native CAD with a patent stent extending from the mid left main into the proximal LAD, 50% narrowings in the LAD after the first diagonal vessel and widely patent mid LAD stent.  There was pain PTCA sites in the left circumflex proximally at the mid segment with 50% stenosis a small OM branch.  There was new total occlusion of his proximal native RCA with faint bridging collaterals and faint collateralization to the distal branch of the RCA which had not been supplied by the vein graft.  He had previously documented occluded LIMA graft.  The vein graft supplying the PDA and PLA vessel was patent but he had new distal disease with 95% stenosis in the mid PDA and very distal 75% stenosis.  He underwent successful intervention to his PDA and due to his prior history of head trauma and CNS bleed.  He underwent insertion of a research BioFreedom stent and after  30 days was transitioned to only single anti platelet therapy rather than DAPT.  Since undergoing his PCI to his mid PDA he has noted marked improvement in his ability to be active.  He continues to participate in cardiac rehabilitation and often walks 1 mile in the morning before breakfast and feels well.  He has noticed occasional angina episodes of angina, particularly when he is tired.    In June 2017 he had a brief episode of PAF, alt doing cardiac rehabilitation.  He states that he has not been sleeping very well.  He has not had any GI issues, but he has only had one colonoscopy in his life and he is 78 years old.  He is seen by Dr. Lyda Jester in [place] and was scheduled for screening colonoscopy and EGD.  When I last saw him, he had noticed some vague exertional chest  discomfort which did not occur during walks in the early morning, but he had noticed it more at the end of the day.  I further titrated his isosorbide and increase this to 90 mg in the morning and 30 mg at night.  He has continued Toprol-XL for beta blocker therapy.  I referred him for a nuclear perfusion study which was done on 10/18/2016.  This showed a calculated EF of 48% but visually it appeared to be 55%.  There was mild possible scar as well as a component of diaphragmatic attenuation but a small area of ischemia could not be completely excluded apically.   When I  saw him in October 2018 he was doing well from a cardiac standpoint and was walking 1-1/2-2 miles per day in the morning and 3 days per week also going to cardiac rehabilitation.  He denied chest pain, PND, orthopnea.  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 Laboratory by Dr. Eden Emms.: Glucose was 163.  Creatinine had increased to 1.61.  Lipids were excellent with a total cholesterol 107, triglycerides 71, HDL 41, and LDL 52.  Hemoglobin A1c was 8.0.   He has been caring for his wife who has developed Alzheimer's disease.  As result he is her primary caregiver.  He is not walking as often.  Over the past month he has noticed some increased leg swelling and saw Dr. Delena Bali last week.  At that time, his amlodipine was reduced from 10 mg down to 5 mg and he was given a prescription for furosemide.  I saw him on May 19, 2018 for one-year evaluation.  At that time, he had noticed some irregularity to his heart rhythm.  He was having lower extremity edema.  His ECG showed atrial fibrillation which was new and of questionable duration.  I discontinued amlodipine, increase furosemide to 40 mg twice a day for 3 days and added hydralazine 25 mg twice a day for his blood pressure.  I initiated Eliquis 5 mg twice a day for anticoagulation and recommended he discontinue Plavix.  Underwent an echo Doppler study on May 28, 2017 which showed an  EF of 45 to 50%.  Mild MR and mild TR.  RV systolic function was mildly reduced.  Laboratory on June 02, 2018 revealed a magnesium of 1.9.  Creatinine had increased to 1.57.    I saw him in follow-up 2 weeks later on June 05, 2018.  He was still in atrial fibrillation at 67 bpm.  He underwent an echo Doppler study on May 28, 2018 which showed an EF of 45 to 50%.  There was moderate aortic sclerosis with trivial AR.  There was mild MR.  Of note, his left atrium was only mildly dilated.  There was mild TR.  His ECG confirmed persistent atrial fibrillation.  His blood pressure was improved with the addition of hydralazine.  During that office visit I titrated Toprol to 75 mg daily.  Presently, he feels well.  He walked on a treadmill today and walked 2 miles without symptoms.  He denies any tachypalpitations, presyncope or syncope.    When I last saw him on July 08, 2018 he continued to be in atrial fibrillation at 73 bpm.  With his left atrium only being mildly dilated, I suggested a trial of amiodarone to see if this could potentially pharmacologically cardiovert him or if unsuccessful by itself with future plans for cardioversion.  As result I started him on amiodarone 200 mg daily for 2 weeks and after 2 weeks he was advised to decrease Toprol to 50 mg and further titrate amiodarone to 200 mg twice a day.  He believes his pulse is slower.  He denies chest pain, PND, orthopnea or dizziness.  He presents for evaluation.  Past Medical History:  Diagnosis Date  . Arthritis   . Chronic kidney disease    CKD STAGE 3;  CHRONIC RENAL INSUFFICIENCY  . Coronary artery disease   . Diabetes mellitus   . Groin hematoma 02/19/12   RIGHT  . Hyperlipidemia   . Hypertension   . Neuropathy due to type 2 diabetes mellitus ([place])   . Obesity   . PVC (premature ventricular contraction)   . SDH (subdural hematoma) ([place]) 02/06/2013   R parafalcine SDH after a fall, rx at [facility]    Past Surgical History:    Procedure Laterality Date  . CARDIAC CATHETERIZATION  01/14/12   LV FXN EF 50-55% W/MILD DISTAL INFERIOR HYPOKINESIS; PCI: LAD PTCA/STENT W/NEW 2.75X22 RESOLUTE DES IN MID LAD POST DIALTED TO 3.08 TO 2.97 TAPER: 99%-80% TO 0; LCX: PTCA VIA LM STENT W/2.25X12 SPRINTER BALLOON: 90%-5; LAD: PATENT PROXIMAL STENT EXTENDING INTO LM W/30-40% SMOOTH NARROWING IN DISTAL PORTION OF STENT; 99% IN STENT RESTENOSIS OF MID LAD STENT   . CARDIAC CATHETERIZATION N/A 12/02/2015   Procedure: Left Heart Cath and Coronary Angiography;  Surgeon: Troy Sine, MD;  Location: [place] CV [facility];  Service: Cardiovascular;  Laterality: N/A;  . CARDIAC CATHETERIZATION N/A 12/02/2015   Procedure: Coronary Stent Intervention;  Surgeon: Troy Sine, MD;  Location: [place] [facility];  Service: Cardiovascular;  Laterality: N/A;  . CORONARY ARTERY BYPASS GRAFT  1997   LIMA to the LAD and vein to the RCA;  . CORONARY STENT PLACEMENT  12/02/2015   PAD  . DOPPLER ECHOCARDIOGRAPHY  01/09/12   LV NORMAL IN SIZE, NORMAL WAL THICKNESS, EF 50-55%; MILD POSTERIOR WALL HYOPKINESIS, THERE IS MILD INFERIOR WALL HYPOKINESIS  . HYPOTHENAR FAT PAD TRANSFER    . LEFT HEART CATHETERIZATION WITH CORONARY/GRAFT ANGIOGRAM N/A 01/14/2012   Procedure: LEFT HEART CATHETERIZATION WITH Beatrix Fetters;  Surgeon: Troy Sine, MD;  Location: [facility] CATH LAB;  Service: Cardiovascular;  Laterality: N/A;  . LOWER ARTERIAL DOPPLER  01/24/12   ESSENTIALLY NORMAL RIGHT GROIN DUPLEX EVALUATION S/P THROMBIN INJECTION  . LOWER VENOUS DUPLEX  02/05/12   ESSENTIALLY NORMAL RIGHT LOWER EXTRIMTY VENOUS DUPLEX DOPPLER EVALUATION  . NM MYOVIEW LTD  01/09/12   HIGH RISK SCAN. COMPARED TO PREVIOUS STUDY, THERE IS NOW ISCHEMIA PRESENT. ABNORMAL MYOCARDIAL PERFUSION STUDY. THERE IS NEW MILD INFEROLATERAL ISCHEMIA TOWARDS THE BASE; PT TO FOLLOW UP WITH DR. [name]  . PERCUTANEOUS CORONARY STENT INTERVENTION (PCI-S) N/A 01/14/2012   Procedure: PERCUTANEOUS CORONARY STENT  INTERVENTION (PCI-S);  Surgeon: Troy Sine, MD;  Location: [facility] CATH LAB;  Service: Cardiovascular;  Laterality: N/A;    Allergies  Allergen Reactions  . Diltiazem Other (See  Comments)    Unknown  . Lovastatin Other (See Comments)    Unknown  . Proton Pump Inhibitors Other (See Comments)    Unknown   . Tramadol Nausea And Vomiting  . Codeine Rash    Current Outpatient Medications  Medication Sig Dispense Refill  . amiodarone (PACERONE) 200 MG tablet Take 200 mg daily x 2 weeks, then increase to 200 mg two times daily 60 tablet 3  . apixaban (ELIQUIS) 5 MG TABS tablet Take 1 tablet (5 mg total) by mouth 2 (two) times daily. 60 tablet 3  . atorvastatin (LIPITOR) 40 MG tablet Take 1 tablet (40 mg total) by mouth daily. 90 tablet 3  . benazepril (LOTENSIN) 20 MG tablet TAKE 1 TABLET(20 MG) BY MOUTH DAILY 90 tablet 3  . cholecalciferol (VITAMIN D) 1000 UNITS tablet Take 1,000 Units by mouth daily.    . famotidine (PEPCID) 10 MG tablet Take 10 mg by mouth daily.    . furosemide (LASIX) 40 MG tablet Take one tablet daily. may take one extra tablet daily for 3 days for swelling 100 tablet 3  . hydrALAZINE (APRESOLINE) 25 MG tablet Take 1 tablet (25 mg total) by mouth 2 (two) times daily. 180 tablet 3  . insulin aspart (NOVOLOG) 100 UNIT/ML FlexPen Inject 12 Units into the skin 2 (two) times daily with a meal.     . isosorbide mononitrate (IMDUR) 60 MG 24 hr tablet Take 1.5 tablets (90 mg total) by mouth every morning AND 0.5 tablets (30 mg total) every evening. 180 tablet 3  . LEVEMIR FLEXTOUCH 100 UNIT/ML Pen Inject 42 Units into the skin 2 (two) times daily.   12  . LINZESS 145 MCG CAPS capsule TK 1 C PO D  11  . metFORMIN (GLUCOPHAGE-XR) 750 MG 24 hr tablet Take 750 mg by mouth daily with breakfast.    . metoprolol succinate (TOPROL-XL) 25 MG 24 hr tablet Take 1 tablet (25 mg total) by mouth daily. 90 tablet 1  . Multiple Vitamin (MULTIVITAMIN WITH MINERALS) TABS Take 1 tablet by mouth  daily.    . nitroGLYCERIN (NITROSTAT) 0.4 MG SL tablet Place 1 tablet (0.4 mg total) under the tongue every 5 (five) minutes as needed. For chest pain 25 tablet 12  . NOVOFINE AUTOCOVER 30G X 8 MM MISC     . ONE TOUCH ULTRA TEST test strip   4  . oxybutynin (DITROPAN) 5 MG tablet Take 1 tablet by mouth daily.    Marland Kitchen PREVNAR 13 SUSP injection   0  . SYNTHROID 75 MCG tablet Take 1 tablet by mouth daily.     . TRULICITY 1.5 EL/3.8BO SOPN Inject 1.5 mg as directed once a week.   12  . vitamin C (ASCORBIC ACID) 500 MG tablet Take 500 mg by mouth daily.     No current facility-administered medications for this visit.     Socially, he is married has one child 2 grandchildren and 2 great-grandchildren. There is no tobacco or alcohol use.  ROS General: Negative; No fevers, chills, or night sweats;  HEENT: Positive for cataracts; No changes in hearing, sinus congestion, difficulty swallowing Pulmonary: Negative; No cough, wheezing, shortness of breath, hemoptysis Cardiovascular: See history of present illness;  GI: Positive for GERD on ranitidine; No nausea, vomiting, diarrhea, or abdominal pain GU: Negative; No dysuria, hematuria, or difficulty voiding Musculoskeletal: Negative; no myalgias, joint pain, or weakness Hematologic/Oncology: Negative; no easy bruising, bleeding Endocrine: Positive for hypothyroidism, on Synthroid replacement; diabetes not well controlled  Neuro: Negative; no changes in balance, headaches Skin: Negative; No rashes or skin lesions Psychiatric: Negative; No behavioral problems, depression Sleep: Negative; No snoring, daytime sleepiness, hypersomnolence, bruxism, restless legs, hypnogognic hallucinations, no cataplexy Other comprehensive 14 point system review is negative.  PE BP (!) 154/72   Pulse (!) 55   Ht '5\' 8"'$  (1.727 m)   Wt 195 lb 6.4 oz (88.6 kg)   BMI 29.71 kg/m    Repeat blood pressure by me was 142/70  Wt Readings from Last 3 Encounters:  08/18/18 195  lb 6.4 oz (88.6 kg)  07/08/18 197 lb (89.4 kg)  06/05/18 197 lb 11.2 oz (89.7 kg)   General: Alert, oriented, no distress.  Skin: normal turgor, no rashes, warm and dry HEENT: Normocephalic, atraumatic. Pupils equal round and reactive to light; sclera anicteric; extraocular muscles intact;  Nose without nasal septal hypertrophy Mouth/Parynx benign; Mallinpatti scale Neck: No JVD, no carotid bruits; normal carotid upstroke Lungs: clear to ausculatation and percussion; no wheezing or rales Chest wall: without tenderness to palpitation Heart: PMI not displaced, really irregular with ventricular rate now in the mid 50s, s1 s2 normal, 1/6 systolic murmur, no diastolic murmur, no rubs, gallops, thrills, or heaves Abdomen: soft, nontender; no hepatosplenomehaly, BS+; abdominal aorta nontender and not dilated by palpation. Back: no CVA tenderness Pulses 2+ Musculoskeletal: full range of motion, normal strength, no joint deformities Extremities: no clubbing cyanosis or edema, Homan's sign negative  Neurologic: grossly nonfocal; Cranial nerves grossly wnl Psychologic: Normal mood and affect   ECG (independently read by me): Atrial fibrillation with ventricular rate at 55 bpm.  Small Q wave in lead III.  July 08, 2018 ECG (independently read by me): Atrial  fibrillation at 73 bpm.  Inferior Q waves lead III and aVF.  QTc interval 449 ms.  June 05, 2018 ECG (independently read by me): Atrial fibrillation at 67 bpm.  QTc interval 456 ms.  No significant ST changes.  Q waves in lead III  May 19, 2018 ECG (independently read by me): Atrial Fibrillation at 69 ; NS t changes   October 2018 ECG (independently read by me): Sinus bradycardia 57 bpm.  Q wave in lead 3.  Nondiagnostic and aVF.  April 2018 ECG (independently read by me): Normal sinus rhythm at 61 bpm.  First-degree AV block with a PR interval 206 ms.  Q wave in 3 and aVF.  June 2017 ECG (independently read by me): Sinus  bradycardia 58 bpm.  Inferior Q waves.  Normal intervals.  No ST segment changes.  May 2017 ECG (independently read by me): Sinus bradycardia 59 bpm.  LVH by voltage criteria.  September 2016 ECG (independently read by me):  Normal sinus rhythm at 65 bpm. Mild voltage for LVH.  01/13/2015 ECG (independently read by me): Sinus bradycardia 58 bpm.  Normal intervals.  October 2015 ECG (independent read by me): Sinus bradycardia at 50 bpm.  No ectopy.  PR interval 196 msec,   Borderline LVH.  June 2015 ECG (independently read by me): Sinus bradycardia 52 beats per minute.  Borderline first degree AV block with a PR interval of 200 ms.  December 2014 ECG (independently read by me): Sinus rhythm at 54 beats per minute. No ectopy. Normal intervals.  ECG from 07/08/2013 : reveals sinus rhythm at 57 beats per minute. QTc interval 453 ms.  LABS: BMP Latest Ref Rng & Units 06/02/2018 12/03/2015 11/25/2015  Glucose 65 - 99 mg/dL 137(H) 110(H) 174(H)  BUN 8 - 27 mg/dL  _0 Creatinine 0.76 - 1.27 mg/dL 1.57(H) 1.22 1.46(H)  BUN/Creat Ratio 10 - 24 15 - -  Sodium 134 - 144 mmol/L 142 141 140  Potassium 3.5 - 5.2 mmol/L 4.1 3.3(L) 4.3  Chloride 96 - 106 mmol/L 104 108 103  CO2 20 - 29 mmol/L _1 Calcium 8.6 - 10.2 mg/dL 9.5 8.7(L) 9.1   Hepatic Function Latest Ref Rng & Units 06/02/2018 11/25/2015  Total Protein 6.0 - 8.5 g/dL 6.6 7.1  Albumin 3.5 - 4.8 g/dL 4.2 4.3  AST 0 - 40 IU/L 24 15  ALT 0 - 44 IU/L 31 16  Alk Phosphatase 39 - 117 IU/L 66 58  Total Bilirubin 0.0 - 1.2 mg/dL 0.8 0.6   CBC Latest Ref Rng & Units 06/02/2018 12/03/2015 11/25/2015  WBC 3.4 - 10.8 x10E3/uL 7.8 9.3 8.6  Hemoglobin 13.0 - 17.7 g/dL 11.8(L) 12.2(L) 14.2  Hematocrit 37.5 - 51.0 % 34.7(L) 36.9(L) 41.9  Platelets 150 - 450 x10E3/uL 189 187 239   Lab Results  Component Value Date   MCV 88 06/02/2018   MCV 90.0 12/03/2015   MCV 89.9 11/25/2015   Lab Results  Component Value Date   TSH 1.890 06/02/2018      Lab Results  Component Value Date   HGBA1C 10.7 (A) 11/01/2014   Lipid Panel      Component Value Date/Time   CHOL 97 (L) 06/02/2018 0811   TRIG 78 06/02/2018 0811   HDL 34 (L) 06/02/2018 0811   CHOLHDL 2.9 06/02/2018 0811   LDLCALC 47 06/02/2018 0811    RADIOLOGY: No results found.  IMPRESSION:  1. Persistent atrial fibrillation   2. Current use of anticoagulant therapy   3. CAD in native artery   4. Hx of CABG   5. Stage 3 chronic kidney disease ([place])   6. DM type 2 with diabetic peripheral neuropathy ([place])   7. Hypothyroidism, unspecified type     ASSESSMENT AND PLAN:  Mr. Pellum is a 78 year old white male who is status post CABG revascularization surgery in 1997 and has a documented occluded LIMA. He has previously undergone intervention both to his proximal LAD extending into the left main with stenting and stenting of his mid LAD. In June 2013 he developed severe in-stent restenosis of his LAD stent and a DES stent was in place at that time. We also went through the stent struts in the left main and performed PTCA of the distal circumflex. He had mild/moderate disease in his PDA after his SVG to his PDA insertion.   A nuclear perfusion study in June 2014 after experiencing some vague episodes of chest pain was low risk and showed distal anteroseptal scar with the extent of 10% without associated ischemia.  His nuclear study in April 2017 revealed a large new defect inferolaterally with scar/ischemia that was not present in 2014 led to repeat cardiac catheterization.  His ischemia was secondary to new high-grade stenosis in the mid PDA and distal PDA for which he underwent successful stenting of the 95% stenosis which was reduced to 0% and PTCA of the very distal 75% stenosis reduced to 0%.  He was enrolled in the research trial and received a polymer free BioFreedom stent.  His aspirin was subsequently to been stopped and he was  Started on Plavix 75 mg daily.  He has not  had any bleeding or GI issues.  He had  developed significant lower extremity edema necessitating reduction of amlodipine.  His  ECG of May 19, 2018 showed atrial fibrillation of questionable duration.  Since that evaluation amlodipine has been discontinued.  Plavix was discontinued and he was started on Eliquis.  He has tolerated this well without bleeding.  Echo Doppler study showed an EF of 45 to 50%.  His left atrium was only mildly dilated at 45 mm.  When I saw him [place] I further titrated Toprol-XL to 75 mg daily.  His blood pressure had improved with hydralazine.  At his last evaluation on July 08, 2018 he was started on amiodarone and was on 200 mg for 2 weeks and for the past 3 weeks has been on 200 mg twice a day with reduction in Toprol-XL dose to 50 mg.  His AF is now in the mid 69s.  I have suggested further reduction of Toprol to 25 mg.  He has now been on anticoagulation for several months.  I will schedule him to undergo outpatient DC cardioversion the week of February 3.  Prior to undergoing the procedure, he will come to the office the day before to reassess his ECG to make certain he has not neurologically converted to sinus rhythm.  Laboratory will be obtained.  He is diabetic on metformin and Trulicity.  He is on levothyroxine 75 mcg for hypothyroidism.    I discussed the risk benefits of DC cardioversion and he agrees to proceed if necessary.  Time spent: 25 minutes Troy Sine, MD, [facility]  08/19/2018 2:59 PM

## 2018-08-18 NOTE — Patient Instructions (Signed)
Medication Instructions:  Decrease metoprolol to 25 mg daily. If you need a refill on your cardiac medications before your next appointment, please call your pharmacy.   Lab work: BMET, CBC 1 week before Cardioversion If you have labs (blood work) drawn today and your tests are completely normal, you will receive your results only by: Marland Kitchen MyChart Message (if you have MyChart) OR . A paper copy in the mail If you have any lab test that is abnormal or we need to change your treatment, we will call you to review the results.  Testing/Procedures: Your physician has requested that you have a Cardioversion.Electrical Cardioversion uses a jolt of electricity to your heart either through paddles or wired patches attached to your chest. This is a controlled, usually prescheduled, procedure. This procedure is done at the hospital and you are not awake during the procedure. You usually go home the day of the procedure. Please see the instruction sheet given to you today for more information.   Follow-Up: At [facility], you and your health needs are our priority.  As part of our continuing mission to provide you with exceptional heart care, we have created designated Provider Care Teams.  These Care Teams include your primary Cardiologist (physician) and Advanced Practice Providers (APPs -  Physician Assistants and Nurse Practitioners) who all work together to provide you with the care you need, when you need it. You will need a follow up appointment in 1 month after Cardioversion.  Please call our office 2 months in advance to schedule this appointment.  You may see Dr.Kelly or one of the following Advanced Practice Providers on your designated Care Team: Almyra Deforest, [place] . Fabian Sharp, PA-C  Any Other Special Instructions Will Be Listed Below (If Applicable). Please come as scheduled for the nurse visit to check an EKG before cardioversion.      You are scheduled for a Cardioversion.  Please arrive  at the [facility] (Main Entrance A) at [facility]: [place], [place] [address] (1 hour prior to procedure unless lab work is needed; if lab work is needed arrive 1.5 hours ahead)  DIET: Nothing to eat or drink after midnight except a sip of water with medications (see medication instructions below)  Medication Instructions: Hold morning insulin after checking blood sugar.  Continue your anticoagulant: Eliquis You will need to continue your anticoagulant after your procedure until you  are told by your  Provider that it is safe to stop   Labs: BMET, CBC 1 week before scheduled.  *Special Note: Every effort is made to have your procedure done on time. Occasionally there are emergencies that occur at the hospital that may cause delays. Please be patient if a delay does occur.

## 2018-08-19 ENCOUNTER — Encounter: Payer: Self-pay | Admitting: Cardiovascular Disease

## 2018-09-04 ENCOUNTER — Ambulatory Visit (INDEPENDENT_AMBULATORY_CARE_PROVIDER_SITE_OTHER): Payer: Medicare Other | Admitting: *Deleted

## 2018-09-04 DIAGNOSIS — I4819 Other persistent atrial fibrillation: Secondary | ICD-10-CM

## 2018-09-04 NOTE — Progress Notes (Signed)
Patient in the office today for EKG prior to cardioversion on 09/08/18. EKG performed and reviewed by Dr Claiborne Billings. Patient remains in AFib with HR 68. Per Dr Claiborne Billings keep cardioversion as planned. Patient aware of recommendations

## 2018-09-05 ENCOUNTER — Telehealth: Payer: Self-pay | Admitting: Cardiovascular Disease

## 2018-09-05 LAB — BASIC METABOLIC PANEL
BUN/Creatinine Ratio: 13 (ref 10–24)
BUN: 22 mg/dL (ref 8–27)
CALCIUM: 9.3 mg/dL (ref 8.6–10.2)
CO2: 26 mmol/L (ref 20–29)
Chloride: 96 mmol/L (ref 96–106)
Creatinine, Ser: 1.68 mg/dL — ABNORMAL HIGH (ref 0.76–1.27)
GFR calc Af Amer: 45 mL/min/{1.73_m2} — ABNORMAL LOW (ref >59)
GFR calc non Af Amer: 39 mL/min/{1.73_m2} — ABNORMAL LOW (ref >59)
Glucose: 127 mg/dL — ABNORMAL HIGH (ref 65–99)
Potassium: 3.9 mmol/L (ref 3.5–5.2)
Sodium: 138 mmol/L (ref 134–144)

## 2018-09-05 LAB — CBC
Hematocrit: 35.9 % — ABNORMAL LOW (ref 37.5–51.0)
Hemoglobin: 11.9 g/dL — ABNORMAL LOW (ref 13.0–17.7)
MCH: 29.8 pg (ref 26.6–33.0)
MCHC: 33.1 g/dL (ref 31.5–35.7)
MCV: 90 fL (ref 79–97)
Platelets: 199 10*3/uL (ref 150–450)
RBC: 3.99 x10E6/uL — ABNORMAL LOW (ref 4.14–5.80)
RDW: 13.7 % (ref 11.6–15.4)
WBC: 7.1 10*3/uL (ref 3.4–10.8)

## 2018-09-05 NOTE — Telephone Encounter (Signed)
New message   Pt daughter rose calling to get more information about procedure on 2/3 with Dr. Claiborne Billings. Shes not sure on the details of what time they need to arrive and where to go.

## 2018-09-05 NOTE — Telephone Encounter (Signed)
Pt's daughter aware for pt  to arrive at [facility] [place] at 6:30 am for 7:30 procedure nothing to eat or drink after midnight night  and to hold am Insulin and Furosemide .Adonis Housekeeper

## 2018-09-08 ENCOUNTER — Encounter (HOSPITAL_COMMUNITY): Admission: RE | Payer: Self-pay | Source: Home / Self Care

## 2018-09-08 ENCOUNTER — Telehealth: Payer: Self-pay | Admitting: Cardiovascular Disease

## 2018-09-08 ENCOUNTER — Encounter (HOSPITAL_COMMUNITY): Payer: Self-pay | Admitting: Anesthesiology

## 2018-09-08 ENCOUNTER — Ambulatory Visit (HOSPITAL_COMMUNITY): Admission: RE | Admit: 2018-09-08 | Payer: Medicare Other | Source: Home / Self Care | Admitting: Cardiovascular Disease

## 2018-09-08 DIAGNOSIS — J029 Acute pharyngitis, unspecified: Secondary | ICD-10-CM | POA: Diagnosis not present

## 2018-09-08 SURGERY — CARDIOVERSION
Anesthesia: General

## 2018-09-08 NOTE — Anesthesia Preprocedure Evaluation (Deleted)
Anesthesia Evaluation    Reviewed: Allergy & Precautions, Patient's Chart, lab work & pertinent test results  History of Anesthesia Complications Negative for: history of anesthetic complications  Airway        Dental   Pulmonary neg pulmonary ROS,           Cardiovascular hypertension, Pt. on home beta blockers and Pt. 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 EF 45% to 50%. Mild   diffuse hypokinesis. Trivial AI and PR, mild MR and TR. LA was mildly dilated. RV systolic function was mildly reduced.    Neuro/Psych  SDH  negative psych ROS   GI/Hepatic negative GI ROS, Neg liver ROS,   Endo/Other  diabetes, Type 2, Insulin Dependent  Renal/GU CRFRenal disease     Musculoskeletal  (+) Arthritis ,   Abdominal   Peds  Hematology  (+) anemia ,   Anesthesia Other Findings   Reproductive/Obstetrics                             Anesthesia Physical Anesthesia Plan  ASA: III  Anesthesia Plan: General   Post-op Pain Management:    Induction: Intravenous  PONV Risk Score and Plan: 2 and Treatment may vary due to age or medical condition and Propofol infusion  Airway Management Planned: Mask and Natural Airway  Additional Equipment: None  Intra-op Plan:   Post-operative Plan:   Informed Consent:   Plan Discussed with: CRNA and Anesthesiologist  Anesthesia Plan Comments:         Anesthesia Quick Evaluation

## 2018-09-08 NOTE — Telephone Encounter (Signed)
Patient called stating someone called today to call back office to reschedule his cardioversion.

## 2018-09-09 NOTE — Telephone Encounter (Signed)
I called patient, advised that I had spoke with [facility], who will be back rounding in the hospital and can do the cardioversion the week of the 17th, I advised with Dr.Kelly who stated anytime that week would be fine. I called patient, LVM to call back if that week work out and what day would be best so I can contact scheduling at hospital.  Left call back number.

## 2018-09-10 ENCOUNTER — Ambulatory Visit: Payer: Medicare Other | Admitting: Physician Assistant

## 2018-09-10 NOTE — Telephone Encounter (Signed)
Called patient, scheduled Cardioversion for February 19th, at 1:00, will verify with PA on how to proceed with patient's insulin doses after being NPO.  Patient verbalized understanding, will call back during follow up time tomorrow.

## 2018-09-10 NOTE — Telephone Encounter (Signed)
Follow up    Patient is returning call in reference to rescheduling his cardioversion. Please call.

## 2018-09-11 NOTE — Telephone Encounter (Signed)
Called patient, had to move Cardioversion from the 19th, to the 21st of February at 3:74 due to conflict with Dr.Kelly's schedule.  Patient was made aware of new time and instructions, will come for labs on Monday Feb 17th Dr.Kelly was also made aware.

## 2018-09-18 DIAGNOSIS — J208 Acute bronchitis due to other specified organisms: Secondary | ICD-10-CM | POA: Diagnosis not present

## 2018-09-22 ENCOUNTER — Other Ambulatory Visit: Payer: Self-pay

## 2018-09-22 ENCOUNTER — Ambulatory Visit (INDEPENDENT_AMBULATORY_CARE_PROVIDER_SITE_OTHER): Payer: Medicare Other | Admitting: *Deleted

## 2018-09-22 DIAGNOSIS — Z01812 Encounter for preprocedural laboratory examination: Secondary | ICD-10-CM

## 2018-09-22 DIAGNOSIS — Z79899 Other long term (current) drug therapy: Secondary | ICD-10-CM

## 2018-09-22 DIAGNOSIS — I4819 Other persistent atrial fibrillation: Secondary | ICD-10-CM | POA: Diagnosis not present

## 2018-09-22 NOTE — Progress Notes (Signed)
Pt report to office today for pre cardioversion EKG and lab work. EKG performed and reviewed by Dr. Martinique (DOD). EKG reveals pt is still in AFib with HR 61. Per Dr. Martinique, pt is to keep scheduled date for cardioversion.

## 2018-09-23 LAB — BASIC METABOLIC PANEL
BUN/Creatinine Ratio: 16 (ref 10–24)
BUN: 25 mg/dL (ref 8–27)
CHLORIDE: 98 mmol/L (ref 96–106)
CO2: 23 mmol/L (ref 20–29)
Calcium: 9.5 mg/dL (ref 8.6–10.2)
Creatinine, Ser: 1.52 mg/dL — ABNORMAL HIGH (ref 0.76–1.27)
GFR calc Af Amer: 51 mL/min/{1.73_m2} — ABNORMAL LOW (ref >59)
GFR calc non Af Amer: 44 mL/min/{1.73_m2} — ABNORMAL LOW (ref >59)
GLUCOSE: 160 mg/dL — AB (ref 65–99)
POTASSIUM: 4 mmol/L (ref 3.5–5.2)
Sodium: 141 mmol/L (ref 134–144)

## 2018-09-23 LAB — CBC
HEMOGLOBIN: 12.2 g/dL — AB (ref 13.0–17.7)
Hematocrit: 37.1 % — ABNORMAL LOW (ref 37.5–51.0)
MCH: 29.8 pg (ref 26.6–33.0)
MCHC: 32.9 g/dL (ref 31.5–35.7)
MCV: 91 fL (ref 79–97)
Platelets: 205 10*3/uL (ref 150–450)
RBC: 4.09 x10E6/uL — ABNORMAL LOW (ref 4.14–5.80)
RDW: 13.9 % (ref 11.6–15.4)
WBC: 8.2 10*3/uL (ref 3.4–10.8)

## 2018-09-26 ENCOUNTER — Ambulatory Visit (HOSPITAL_COMMUNITY)
Admission: RE | Admit: 2018-09-26 | Discharge: 2018-09-26 | Disposition: A | Discharge status: Home / Self Care | Payer: Medicare Other | Attending: Cardiovascular Disease | Admitting: Cardiovascular Disease

## 2018-09-26 ENCOUNTER — Ambulatory Visit (HOSPITAL_COMMUNITY): Payer: Medicare Other | Admitting: Certified Registered Nurse Anesthetist

## 2018-09-26 ENCOUNTER — Other Ambulatory Visit: Payer: Self-pay

## 2018-09-26 ENCOUNTER — Encounter (HOSPITAL_COMMUNITY): Payer: Self-pay | Admitting: Certified Registered Nurse Anesthetist

## 2018-09-26 ENCOUNTER — Encounter (HOSPITAL_COMMUNITY)
Admission: RE | Disposition: A | Discharge status: Home / Self Care | Payer: Self-pay | Source: Home / Self Care | Attending: Cardiovascular Disease

## 2018-09-26 DIAGNOSIS — E785 Hyperlipidemia, unspecified: Secondary | ICD-10-CM | POA: Diagnosis not present

## 2018-09-26 DIAGNOSIS — Z7901 Long term (current) use of anticoagulants: Secondary | ICD-10-CM | POA: Diagnosis not present

## 2018-09-26 DIAGNOSIS — M1990 Unspecified osteoarthritis, unspecified site: Secondary | ICD-10-CM | POA: Insufficient documentation

## 2018-09-26 DIAGNOSIS — N183 Chronic kidney disease, stage 3 (moderate): Secondary | ICD-10-CM | POA: Diagnosis not present

## 2018-09-26 DIAGNOSIS — E1122 Type 2 diabetes mellitus with diabetic chronic kidney disease: Secondary | ICD-10-CM | POA: Diagnosis not present

## 2018-09-26 DIAGNOSIS — I10 Essential (primary) hypertension: Secondary | ICD-10-CM | POA: Diagnosis not present

## 2018-09-26 DIAGNOSIS — Z79899 Other long term (current) drug therapy: Secondary | ICD-10-CM | POA: Insufficient documentation

## 2018-09-26 DIAGNOSIS — Z955 Presence of coronary angioplasty implant and graft: Secondary | ICD-10-CM | POA: Insufficient documentation

## 2018-09-26 DIAGNOSIS — I4811 Longstanding persistent atrial fibrillation: Secondary | ICD-10-CM

## 2018-09-26 DIAGNOSIS — E039 Hypothyroidism, unspecified: Secondary | ICD-10-CM | POA: Diagnosis not present

## 2018-09-26 DIAGNOSIS — Z79890 Hormone replacement therapy (postmenopausal): Secondary | ICD-10-CM | POA: Insufficient documentation

## 2018-09-26 DIAGNOSIS — I4819 Other persistent atrial fibrillation: Secondary | ICD-10-CM | POA: Diagnosis not present

## 2018-09-26 DIAGNOSIS — Z885 Allergy status to narcotic agent status: Secondary | ICD-10-CM | POA: Insufficient documentation

## 2018-09-26 DIAGNOSIS — Z6827 Body mass index (BMI) 27.0-27.9, adult: Secondary | ICD-10-CM | POA: Diagnosis not present

## 2018-09-26 DIAGNOSIS — Z7902 Long term (current) use of antithrombotics/antiplatelets: Secondary | ICD-10-CM | POA: Diagnosis not present

## 2018-09-26 DIAGNOSIS — E1142 Type 2 diabetes mellitus with diabetic polyneuropathy: Secondary | ICD-10-CM | POA: Insufficient documentation

## 2018-09-26 DIAGNOSIS — I129 Hypertensive chronic kidney disease with stage 1 through stage 4 chronic kidney disease, or unspecified chronic kidney disease: Secondary | ICD-10-CM | POA: Insufficient documentation

## 2018-09-26 DIAGNOSIS — Z951 Presence of aortocoronary bypass graft: Secondary | ICD-10-CM | POA: Diagnosis not present

## 2018-09-26 DIAGNOSIS — Z888 Allergy status to other drugs, medicaments and biological substances status: Secondary | ICD-10-CM | POA: Insufficient documentation

## 2018-09-26 DIAGNOSIS — Z794 Long term (current) use of insulin: Secondary | ICD-10-CM | POA: Insufficient documentation

## 2018-09-26 DIAGNOSIS — E669 Obesity, unspecified: Secondary | ICD-10-CM | POA: Diagnosis not present

## 2018-09-26 DIAGNOSIS — I4891 Unspecified atrial fibrillation: Secondary | ICD-10-CM | POA: Diagnosis not present

## 2018-09-26 DIAGNOSIS — I2510 Atherosclerotic heart disease of native coronary artery without angina pectoris: Secondary | ICD-10-CM | POA: Diagnosis not present

## 2018-09-26 HISTORY — PX: CARDIOVERSION: SHX1299

## 2018-09-26 LAB — GLUCOSE, CAPILLARY: Glucose-Capillary: 145 mg/dL — ABNORMAL HIGH (ref 70–99)

## 2018-09-26 SURGERY — CARDIOVERSION
Anesthesia: General

## 2018-09-26 MED ORDER — LIDOCAINE 2% (20 MG/ML) 5 ML SYRINGE
INTRAMUSCULAR | Status: DC | PRN
  Administered 2018-09-26: 80 mg via INTRAVENOUS

## 2018-09-26 MED ORDER — PROPOFOL 10 MG/ML IV BOLUS
INTRAVENOUS | Status: DC | PRN
  Administered 2018-09-26: 90 mg via INTRAVENOUS

## 2018-09-26 MED ORDER — SODIUM CHLORIDE 0.9 % IV SOLN
INTRAVENOUS | Status: DC | PRN
  Administered 2018-09-26: 09:00:00 via INTRAVENOUS

## 2018-09-26 MED ORDER — SODIUM CHLORIDE 0.9 % IV SOLN: INTRAVENOUS | Status: DC

## 2018-09-26 NOTE — Anesthesia Preprocedure Evaluation (Addendum)
Anesthesia Evaluation  Patient identified by MRN, date of birth, ID band Patient awake    Reviewed: Allergy & Precautions, NPO status , Patient's Chart, lab work & pertinent test results  Airway Mallampati: II  TM Distance: >3 FB Neck ROM: Full    Dental no notable dental hx. (+) Edentulous Upper, Dental Advisory Given   Pulmonary neg pulmonary ROS,    Pulmonary exam normal breath sounds clear to auscultation       Cardiovascular hypertension, + CAD  Normal cardiovascular exam Rhythm:Regular Rate:Normal     Neuro/Psych negative neurological ROS  negative psych ROS   GI/Hepatic negative GI ROS, Neg liver ROS,   Endo/Other  negative endocrine ROSdiabetes  Renal/GU negative Renal ROS  negative genitourinary   Musculoskeletal  (+) Arthritis , Osteoarthritis,    Abdominal   Peds negative pediatric ROS (+)  Hematology negative hematology ROS (+)   Anesthesia Other Findings   Reproductive/Obstetrics negative OB ROS                            Anesthesia Physical Anesthesia Plan  ASA: III  Anesthesia Plan: General   Post-op Pain Management:    Induction: Intravenous  PONV Risk Score and Plan: 2 and Treatment may vary due to age or medical condition  Airway Management Planned: Mask  Additional Equipment:   Intra-op Plan:   Post-operative Plan:   Informed Consent: I have reviewed the patients History and Physical, chart, labs and discussed the procedure including the risks, benefits and alternatives for the proposed anesthesia with the patient or authorized representative who has indicated his/her understanding and acceptance.     Dental advisory given  Plan Discussed with: CRNA  Anesthesia Plan Comments:         Anesthesia Quick Evaluation

## 2018-09-26 NOTE — Discharge Instructions (Signed)
Electrical Cardioversion, Care After °This sheet gives you information about how to care for yourself after your procedure. Your health care provider may also give you more specific instructions. If you have problems or questions, contact your health care provider. °What can I expect after the procedure? °After the procedure, it is common to have: °· Some redness on the skin where the shocks were given. °Follow these instructions at home: ° °· Do not drive for 24 hours if you were given a medicine to help you relax (sedative). °· Take over-the-counter and prescription medicines only as told by your health care provider. °· Ask your health care provider how to check your pulse. Check it often. °· Rest for 48 hours after the procedure or as told by your health care provider. °· Avoid or limit your caffeine use as told by your health care provider. °Contact a health care provider if: °· You feel like your heart is beating too quickly or your pulse is not regular. °· You have a serious muscle cramp that does not go away. °Get help right away if: ° °· You have discomfort in your chest. °· You are dizzy or you feel faint. °· You have trouble breathing or you are short of breath. °· Your speech is slurred. °· You have trouble moving an arm or leg on one side of your body. °· Your fingers or toes turn cold or blue. °This information is not intended to replace advice given to you by your health care provider. Make sure you discuss any questions you have with your health care provider. °Document Released: 05/13/2013 Document Revised: 02/24/2016 Document Reviewed: 01/27/2016 °Elsevier Interactive Patient Education © 2019 [employer] ° °

## 2018-09-26 NOTE — Transfer of Care (Signed)
Immediate Anesthesia Transfer of Care Note  Patient: Roberto Haley  Procedure(s) Performed: CARDIOVERSION (N/A )  Patient Location: Endoscopy Unit  Anesthesia Type:General  Level of Consciousness: awake, alert  and oriented  Airway & Oxygen Therapy: Patient Spontanous Breathing and Patient connected to nasal cannula oxygen  Post-op Assessment: Report given to RN, Post -op Vital signs reviewed and stable and Patient moving all extremities X 4  Post vital signs: Reviewed and stable  Last Vitals:  Vitals Value Taken Time  BP    Temp    Pulse    Resp    SpO2      Last Pain:  Vitals:   09/26/18 0850  TempSrc: Oral  PainSc: 0-No pain         Complications: No apparent anesthesia complications

## 2018-09-26 NOTE — Anesthesia Procedure Notes (Signed)
Procedure Name: General with mask airway Date/Time: 09/26/2018 9:31 AM Performed by: Harden Mo, CRNA Pre-anesthesia Checklist: Patient identified, Suction available, Patient being monitored and Emergency Drugs available Patient Re-evaluated:Patient Re-evaluated prior to induction Oxygen Delivery Method: Ambu bag Preoxygenation: Pre-oxygenation with 100% oxygen Induction Type: IV induction Placement Confirmation: positive ETCO2 and breath sounds checked- equal and bilateral Dental Injury: Teeth and Oropharynx as per pre-operative assessment

## 2018-09-26 NOTE — Anesthesia Postprocedure Evaluation (Signed)
Anesthesia Post Note  Patient: Roberto Haley  Procedure(s) Performed: CARDIOVERSION (N/A )     Anesthesia Post Evaluation  Last Vitals:  Vitals:   09/26/18 1010 09/26/18 1020  BP: (!) 131/58 (!) 133/50  Pulse: 60 (!) 58  Resp: 13 18  Temp:    SpO2: 96% 96%    Last Pain:  Vitals:   09/26/18 1020  TempSrc:   PainSc: 0-No pain                 Lynda Rainwater

## 2018-09-26 NOTE — CV Procedure (Signed)
  CARDIOVERSION NOTE   Procedure: Electrical Cardioversion Indications:  Atrial Fibrillation  Procedure Details:  Consent: Risks of procedure as well as the alternatives and risks of each were explained to the (patient/caregiver).  Consent for procedure obtained.  Time Out: Verified patient identification, verified procedure, site/side was marked, verified correct patient position, special equipment/implants available, medications/allergies/relevent history reviewed, required imaging and test results available.  Performed  Patient placed on cardiac monitor, pulse oximetry, supplemental oxygen as necessary.  Sedation given: lidocaine 80 mg and propofol 90 mg Pacer pads placed anterior and posterior chest.  Cardioverted 1 time(s).  Cardioverted at 120J.  Evaluation: Findings: Post procedure EKG shows: NSR Complications: None Patient did tolerate procedure well.    Troy Sine, MD, [facility] 09/26/2018 9:47 AM

## 2018-09-26 NOTE — H&P (Signed)
Updated H&P:   History Roberto Haley is a 78 year old gentleman who presents today for outpatient elective DC cardioversion for persistent atrial fibrillation.  Please refer to my comprehensive note of August 18, 2018 as shown below:  Mr Milam has known CAD and underwent CABG revascularization surgery in 1997 with LIMA to LAD, vein to the RCA. In March 1998 he underwent complex intervention to his native LAD and high speed rotational atherectomy (HSRA) and stenting of his LAD and PTCA of his diagonal vessel after he was found to have stenosis in the LIMA graft. In June 2013 due to 99% in-stent restenosis in the LAD stent and a new stenosis of 90% the distal circumflex, he underwent two-vessel intervention with insertion of a 2.75x22 mm resident stent in the LAD and PTCA of the distal circumflex via his previously placed stent which started in the mid left main coronary artery. A cardiopulmonary met test showed reduced maximum oxygen consumption. I  reduced his beta blocker therapy due to significant chronotropic incompetence and further titrated his Ranexa to 1000 mg twice a day.  A nuclear perfusion study in June 2014 showed distal anteroseptal scar without significant ischemia. He did have PACs and PVCs in gating was not done.  He fell on the Fourth of July 2014 and landed on his head. He ultimately was transferred to [facility] where was hospitalized for a week. He did not have neurosurgery. He was told of having "blood on his brain."  He did have a left-sided per hemiparesis. He then spent 2 months at [facility] rehabilitation unit and has spent additional time a universal rehabilitation in [place] until October 31.  He is significant improved following his head trauma with return of his memory.  Last year, I reduced his beta blocker therapy due to bradycardia.  He underwent a nuclear perfusion study on 11/22/2015 which now showed an ejection fraction of 42% with inferolateral  hypokinesis.  There was a new large defect in the inferolateral wall and there was no change in his previous anteroseptal defect.  There was concern for scar/ischemia inferolaterally which was new.   He underwent cardiac catheterization by me on 04/28/2017and was found to have significant native CAD with a patent stent extending from the mid left main into the proximal LAD, 50% narrowings in the LAD after the first diagonal vessel and widely patent mid LAD stent.  There was pain PTCA sites in the left circumflex proximally at the mid segment with 50% stenosis a small OM branch.  There was new total occlusion of his proximal native RCA with faint bridging collaterals and faint collateralization to the distal branch of the RCA which had not been supplied by the vein graft.  He had previously documented occluded LIMA graft.  The vein graft supplying the PDA and PLA vessel was patent but he had new distal disease with 95% stenosis in the mid PDA and very distal 75% stenosis.  He underwent successful intervention to his PDA and due to his prior history of head trauma and CNS bleed.  He underwent insertion of a research BioFreedom stent and after  30 days was transitioned to only single anti platelet therapy rather than DAPT.  Since undergoing his PCI to his mid PDA he has noted marked improvement in his ability to be active.  He continues to participate in cardiac rehabilitation and often walks 1 mile in the morning before breakfast and feels well.  He has noticed occasional angina episodes of angina, particularly when he  is tired.    In June 2017 he had a brief episode of PAF, alt doing cardiac rehabilitation.  He states that he has not been sleeping very well.  He has not had any GI issues, but he has only had one colonoscopy in his life and he is 78 years old.  He is seen by Dr. Lyda Jester in [place] and was scheduled for screening colonoscopy and EGD.  When I last saw him, he had noticed some vague exertional  chest discomfort which did not occur during walks in the early morning, but he had noticed it more at the end of the day.  I further titrated his isosorbide and increase this to 90 mg in the morning and 30 mg at night.  He has continued Toprol-XL for beta blocker therapy.  I referred him for a nuclear perfusion study which was done on 10/18/2016.  This showed a calculated EF of 48% but visually it appeared to be 55%.  There was mild possible scar as well as a component of diaphragmatic attenuation but a small area of ischemia could not be completely excluded apically.   When I  saw him in October 2018 he was doing well from a cardiac standpoint and was walking 1-1/2-2 miles per day in the morning and 3 days per week also going to cardiac rehabilitation.  He denied chest pain, PND, orthopnea.  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 Laboratory by Dr. Eden Emms.: Glucose was 163.  Creatinine had increased to 1.61.  Lipids were excellent with a total cholesterol 107, triglycerides 71, HDL 41, and LDL 52.  Hemoglobin A1c was 8.0.   He has been caring for his wife who has developed Alzheimer's disease.  As result he is her primary caregiver.  He is not walking as often.  Over the past month he has noticed some increased leg swelling and saw Dr. Delena Bali last week.  At that time, his amlodipine was reduced from 10 mg down to 5 mg and he was given a prescription for furosemide.  I saw him on May 19, 2018 for one-year evaluation.  At that time, he had noticed some irregularity to his heart rhythm.  He was having lower extremity edema.  His ECG showed atrial fibrillation which was new and of questionable duration.  I discontinued amlodipine, increase furosemide to 40 mg twice a day for 3 days and added hydralazine 25 mg twice a day for his blood pressure.  I initiated Eliquis 5 mg twice a day for anticoagulation and recommended he discontinue Plavix.  Underwent an echo Doppler study on May 28, 2017 which  showed an EF of 45 to 50%.  Mild MR and mild TR.  RV systolic function was mildly reduced.  Laboratory on June 02, 2018 revealed a magnesium of 1.9.  Creatinine had increased to 1.57.    I saw him in follow-up 2 weeks later on June 05, 2018.  He was still in atrial fibrillation at 67 bpm.  He underwent an echo Doppler study on May 28, 2018 which showed an EF of 45 to 50%.  There was moderate aortic sclerosis with trivial AR.  There was mild MR.  Of note, his left atrium was only mildly dilated.  There was mild TR.  His ECG confirmed persistent atrial fibrillation.  His blood pressure was improved with the addition of hydralazine.  During that office visit I titrated Toprol to 75 mg daily.  Presently, he feels well.  He walked on a treadmill  today and walked 2 miles without symptoms.  He denies any tachypalpitations, presyncope or syncope.    When I last saw him on July 08, 2018 he continued to be in atrial fibrillation at 73 bpm.  With his left atrium only being mildly dilated, I suggested a trial of amiodarone to see if this could potentially pharmacologically cardiovert him or if unsuccessful by itself with future plans for cardioversion.  As result I started him on amiodarone 200 mg daily for 2 weeks and after 2 weeks he was advised to decrease Toprol to 50 mg and further titrate amiodarone to 200 mg twice a day.  He believes his pulse is slower.  He denies chest pain, PND, orthopnea or dizziness.  He presents for evaluation.      Past Medical History:  Diagnosis Date  . Arthritis   . Chronic kidney disease    CKD STAGE 3;  CHRONIC RENAL INSUFFICIENCY  . Coronary artery disease   . Diabetes mellitus   . Groin hematoma 02/19/12   RIGHT  . Hyperlipidemia   . Hypertension   . Neuropathy due to type 2 diabetes mellitus ([place])   . Obesity   . PVC (premature ventricular contraction)   . SDH (subdural hematoma) ([place]) 02/06/2013   R parafalcine SDH after a fall, rx at [facility]          Past Surgical History:  Procedure Laterality Date  . CARDIAC CATHETERIZATION  01/14/12   LV FXN EF 50-55% W/MILD DISTAL INFERIOR HYPOKINESIS; PCI: LAD PTCA/STENT W/NEW 2.75X22 RESOLUTE DES IN MID LAD POST DIALTED TO 3.08 TO 2.97 TAPER: 99%-80% TO 0; LCX: PTCA VIA LM STENT W/2.25X12 SPRINTER BALLOON: 90%-5; LAD: PATENT PROXIMAL STENT EXTENDING INTO LM W/30-40% SMOOTH NARROWING IN DISTAL PORTION OF STENT; 99% IN STENT RESTENOSIS OF MID LAD STENT   . CARDIAC CATHETERIZATION N/A 12/02/2015   Procedure: Left Heart Cath and Coronary Angiography;  Surgeon: Troy Sine, MD;  Location: [place] [facility];  Service: Cardiovascular;  Laterality: N/A;  . CARDIAC CATHETERIZATION N/A 12/02/2015   Procedure: Coronary Stent Intervention;  Surgeon: Troy Sine, MD;  Location: [place] [facility];  Service: Cardiovascular;  Laterality: N/A;  . CORONARY ARTERY BYPASS GRAFT  1997   LIMA to the LAD and vein to the RCA;  . CORONARY STENT PLACEMENT  12/02/2015   PAD  . DOPPLER ECHOCARDIOGRAPHY  01/09/12   LV NORMAL IN SIZE, NORMAL WAL THICKNESS, EF 50-55%; MILD POSTERIOR WALL HYOPKINESIS, THERE IS MILD INFERIOR WALL HYPOKINESIS  . HYPOTHENAR FAT PAD TRANSFER    . LEFT HEART CATHETERIZATION WITH CORONARY/GRAFT ANGIOGRAM N/A 01/14/2012   Procedure: LEFT HEART CATHETERIZATION WITH Beatrix Fetters;  Surgeon: Troy Sine, MD;  Location: [facility] CATH LAB;  Service: Cardiovascular;  Laterality: N/A;  . LOWER ARTERIAL DOPPLER  01/24/12   ESSENTIALLY NORMAL RIGHT GROIN DUPLEX EVALUATION S/P THROMBIN INJECTION  . LOWER VENOUS DUPLEX  02/05/12   ESSENTIALLY NORMAL RIGHT LOWER EXTRIMTY VENOUS DUPLEX DOPPLER EVALUATION  . NM MYOVIEW LTD  01/09/12   HIGH RISK SCAN. COMPARED TO PREVIOUS STUDY, THERE IS NOW ISCHEMIA PRESENT. ABNORMAL MYOCARDIAL PERFUSION STUDY. THERE IS NEW MILD INFEROLATERAL ISCHEMIA TOWARDS THE BASE; PT TO FOLLOW UP WITH DR. [name]  . PERCUTANEOUS CORONARY STENT INTERVENTION (PCI-S)  N/A 01/14/2012   Procedure: PERCUTANEOUS CORONARY STENT INTERVENTION (PCI-S);  Surgeon: Troy Sine, MD;  Location: [facility] CATH LAB;  Service: Cardiovascular;  Laterality: N/A;         Allergies  Allergen Reactions  .  Diltiazem Other (See Comments)    Unknown  . Lovastatin Other (See Comments)    Unknown  . Proton Pump Inhibitors Other (See Comments)    Unknown   . Tramadol Nausea And Vomiting  . Codeine Rash          Current Outpatient Medications  Medication Sig Dispense Refill  . amiodarone (PACERONE) 200 MG tablet Take 200 mg daily x 2 weeks, then increase to 200 mg two times daily 60 tablet 3  . apixaban (ELIQUIS) 5 MG TABS tablet Take 1 tablet (5 mg total) by mouth 2 (two) times daily. 60 tablet 3  . atorvastatin (LIPITOR) 40 MG tablet Take 1 tablet (40 mg total) by mouth daily. 90 tablet 3  . benazepril (LOTENSIN) 20 MG tablet TAKE 1 TABLET(20 MG) BY MOUTH DAILY 90 tablet 3  . cholecalciferol (VITAMIN D) 1000 UNITS tablet Take 1,000 Units by mouth daily.    . famotidine (PEPCID) 10 MG tablet Take 10 mg by mouth daily.    . furosemide (LASIX) 40 MG tablet Take one tablet daily. may take one extra tablet daily for 3 days for swelling 100 tablet 3  . hydrALAZINE (APRESOLINE) 25 MG tablet Take 1 tablet (25 mg total) by mouth 2 (two) times daily. 180 tablet 3  . insulin aspart (NOVOLOG) 100 UNIT/ML FlexPen Inject 12 Units into the skin 2 (two) times daily with a meal.     . isosorbide mononitrate (IMDUR) 60 MG 24 hr tablet Take 1.5 tablets (90 mg total) by mouth every morning AND 0.5 tablets (30 mg total) every evening. 180 tablet 3  . LEVEMIR FLEXTOUCH 100 UNIT/ML Pen Inject 42 Units into the skin 2 (two) times daily.   12  . LINZESS 145 MCG CAPS capsule TK 1 C PO D  11  . metFORMIN (GLUCOPHAGE-XR) 750 MG 24 hr tablet Take 750 mg by mouth daily with breakfast.    . metoprolol succinate (TOPROL-XL) 25 MG 24 hr tablet Take 1 tablet (25 mg total) by mouth daily. 90  tablet 1  . Multiple Vitamin (MULTIVITAMIN WITH MINERALS) TABS Take 1 tablet by mouth daily.    . nitroGLYCERIN (NITROSTAT) 0.4 MG SL tablet Place 1 tablet (0.4 mg total) under the tongue every 5 (five) minutes as needed. For chest pain 25 tablet 12  . NOVOFINE AUTOCOVER 30G X 8 MM MISC     . ONE TOUCH ULTRA TEST test strip   4  . oxybutynin (DITROPAN) 5 MG tablet Take 1 tablet by mouth daily.    Marland Kitchen PREVNAR 13 SUSP injection   0  . SYNTHROID 75 MCG tablet Take 1 tablet by mouth daily.     . TRULICITY 1.5 TI/4.5YK SOPN Inject 1.5 mg as directed once a week.   12  . vitamin C (ASCORBIC ACID) 500 MG tablet Take 500 mg by mouth daily.     No current facility-administered medications for this visit.     Socially, he is married has one child 2 grandchildren and 2 great-grandchildren. There is no tobacco or alcohol use.  ROS General: Negative; No fevers, chills, or night sweats;  HEENT: Positive for cataracts; No changes in hearing, sinus congestion, difficulty swallowing Pulmonary: Negative; No cough, wheezing, shortness of breath, hemoptysis Cardiovascular: See history of present illness;  GI: Positive for GERD on ranitidine; No nausea, vomiting, diarrhea, or abdominal pain GU: Negative; No dysuria, hematuria, or difficulty voiding Musculoskeletal: Negative; no myalgias, joint pain, or weakness Hematologic/Oncology: Negative; no easy bruising, bleeding Endocrine: Positive  for hypothyroidism, on Synthroid replacement; diabetes not well controlled Neuro: Negative; no changes in balance, headaches Skin: Negative; No rashes or skin lesions Psychiatric: Negative; No behavioral problems, depression Sleep: Negative; No snoring, daytime sleepiness, hypersomnolence, bruxism, restless legs, hypnogognic hallucinations, no cataplexy Other comprehensive 14 point system review is negative.  PE BP (!) 154/72   Pulse (!) 55   Ht '5\' 8"'  (1.727 m)   Wt 195 lb 6.4 oz (88.6 kg)   BMI  29.71 kg/m    Repeat blood pressure by me was 142/70     Wt Readings from Last 3 Encounters:  08/18/18 195 lb 6.4 oz (88.6 kg)  07/08/18 197 lb (89.4 kg)  06/05/18 197 lb 11.2 oz (89.7 kg)   General: Alert, oriented, no distress.  Skin: normal turgor, no rashes, warm and dry HEENT: Normocephalic, atraumatic. Pupils equal round and reactive to light; sclera anicteric; extraocular muscles intact;  Nose without nasal septal hypertrophy Mouth/Parynx benign; Mallinpatti scale Neck: No JVD, no carotid bruits; normal carotid upstroke Lungs: clear to ausculatation and percussion; no wheezing or rales Chest wall: without tenderness to palpitation Heart: PMI not displaced, really irregular with ventricular rate now in the mid 50s, s1 s2 normal, 1/6 systolic murmur, no diastolic murmur, no rubs, gallops, thrills, or heaves Abdomen: soft, nontender; no hepatosplenomehaly, BS+; abdominal aorta nontender and not dilated by palpation. Back: no CVA tenderness Pulses 2+ Musculoskeletal: full range of motion, normal strength, no joint deformities Extremities: no clubbing cyanosis or edema, Homan's sign negative  Neurologic: grossly nonfocal; Cranial nerves grossly wnl Psychologic: Normal mood and affect   ECG (independently read by me): Atrial fibrillation with ventricular rate at 55 bpm.  Small Q wave in lead III.  July 08, 2018 ECG (independently read by me): Atrial  fibrillation at 73 bpm.  Inferior Q waves lead III and aVF.  QTc interval 449 ms.  June 05, 2018 ECG (independently read by me): Atrial fibrillation at 67 bpm.  QTc interval 456 ms.  No significant ST changes.  Q waves in lead III  May 19, 2018 ECG (independently read by me): Atrial Fibrillation at 69 ; NS t changes   October 2018 ECG (independently read by me): Sinus bradycardia 57 bpm.  Q wave in lead 3.  Nondiagnostic and aVF.  April 2018 ECG (independently read by me): Normal sinus rhythm at 61 bpm.   First-degree AV block with a PR interval 206 ms.  Q wave in 3 and aVF.  June 2017 ECG (independently read by me): Sinus bradycardia 58 bpm.  Inferior Q waves.  Normal intervals.  No ST segment changes.  May 2017 ECG (independently read by me): Sinus bradycardia 59 bpm.  LVH by voltage criteria.  September 2016 ECG (independently read by me):  Normal sinus rhythm at 65 bpm. Mild voltage for LVH.  01/13/2015 ECG (independently read by me): Sinus bradycardia 58 bpm.  Normal intervals.  October 2015 ECG (independent read by me): Sinus bradycardia at 50 bpm.  No ectopy.  PR interval 196 msec,   Borderline LVH.  June 2015 ECG (independently read by me): Sinus bradycardia 52 beats per minute.  Borderline first degree AV block with a PR interval of 200 ms.  December 2014 ECG (independently read by me): Sinus rhythm at 54 beats per minute. No ectopy. Normal intervals.  ECG from 07/08/2013 : reveals sinus rhythm at 57 beats per minute. QTc interval 453 ms.  LABS: BMP Latest Ref Rng & Units 06/02/2018 12/03/2015 11/25/2015  Glucose 65 -  99 mg/dL 137(H) 110(H) 174(H)  BUN 8 - 27 mg/dL '24 16 20  ' Creatinine 0.76 - 1.27 mg/dL 1.57(H) 1.22 1.46(H)  BUN/Creat Ratio 10 - 24 15 - -  Sodium 134 - 144 mmol/L 142 141 140  Potassium 3.5 - 5.2 mmol/L 4.1 3.3(L) 4.3  Chloride 96 - 106 mmol/L 104 108 103  CO2 20 - 29 mmol/L '28 22 29  ' Calcium 8.6 - 10.2 mg/dL 9.5 8.7(L) 9.1   Hepatic Function Latest Ref Rng & Units 06/02/2018 11/25/2015  Total Protein 6.0 - 8.5 g/dL 6.6 7.1  Albumin 3.5 - 4.8 g/dL 4.2 4.3  AST 0 - 40 IU/L 24 15  ALT 0 - 44 IU/L 31 16  Alk Phosphatase 39 - 117 IU/L 66 58  Total Bilirubin 0.0 - 1.2 mg/dL 0.8 0.6   CBC Latest Ref Rng & Units 06/02/2018 12/03/2015 11/25/2015  WBC 3.4 - 10.8 x10E3/uL 7.8 9.3 8.6  Hemoglobin 13.0 - 17.7 g/dL 11.8(L) 12.2(L) 14.2  Hematocrit 37.5 - 51.0 % 34.7(L) 36.9(L) 41.9  Platelets 150 - 450 x10E3/uL 189 187 239        Lab Results  Component  Value Date   MCV 88 06/02/2018   MCV 90.0 12/03/2015   MCV 89.9 11/25/2015        Lab Results  Component Value Date   TSH 1.890 06/02/2018          Lab Results  Component Value Date   HGBA1C 10.7 (A) 11/01/2014   Lipid Panel      Component Value Date/Time   CHOL 97 (L) 06/02/2018 0811   TRIG 78 06/02/2018 0811   HDL 34 (L) 06/02/2018 0811   CHOLHDL 2.9 06/02/2018 0811   LDLCALC 47 06/02/2018 0811    RADIOLOGY: No results found.  IMPRESSION:  1. Persistent atrial fibrillation   2. Current use of anticoagulant therapy   3. CAD in native artery   4. Hx of CABG   5. Stage 3 chronic kidney disease ([place])   6. DM type 2 with diabetic peripheral neuropathy ([place])   7. Hypothyroidism, unspecified type     ASSESSMENT AND PLAN:  Mr. Redmann is a 78 year old white male who is status post CABG revascularization surgery in 1997 and has a documented occluded LIMA. He has previously undergone intervention both to his proximal LAD extending into the left main with stenting and stenting of his mid LAD. In June 2013 he developed severe in-stent restenosis of his LAD stent and a DES stent was in place at that time. We also went through the stent struts in the left main and performed PTCA of the distal circumflex. He had mild/moderate disease in his PDA after his SVG to his PDA insertion.   A nuclear perfusion study in June 2014 after experiencing some vague episodes of chest pain was low risk and showed distal anteroseptal scar with the extent of 10% without associated ischemia.  His nuclear study in April 2017 revealed a large new defect inferolaterally with scar/ischemia that was not present in 2014 led to repeat cardiac catheterization.  His ischemia was secondary to new high-grade stenosis in the mid PDA and distal PDA for which he underwent successful stenting of the 95% stenosis which was reduced to 0% and PTCA of the very distal 75% stenosis reduced to 0%.  He was  enrolled in the research trial and received a polymer free BioFreedom stent.  His aspirin was subsequently to been stopped and he was  Started on Plavix 75 mg  daily.  He has not had any bleeding or GI issues.  He had  developed significant lower extremity edema necessitating reduction of amlodipine.  His ECG of May 19, 2018 showed atrial fibrillation of questionable duration.  Since that evaluation amlodipine has been discontinued.  Plavix was discontinued and he was started on Eliquis.  He has tolerated this well without bleeding.  Echo Doppler study showed an EF of 45 to 50%.  His left atrium was only mildly dilated at 45 mm.  When I saw him [place] I further titrated Toprol-XL to 75 mg daily.  His blood pressure had improved with hydralazine.  At his last evaluation on July 08, 2018 he was started on amiodarone and was on 200 mg for 2 weeks and for the past 3 weeks has been on 200 mg twice a day with reduction in Toprol-XL dose to 50 mg.  His AF is now in the mid 43s.  I have suggested further reduction of Toprol to 25 mg.  He has now been on anticoagulation for several months.  I will schedule him to undergo outpatient DC cardioversion the week of February 3.  Prior to undergoing the procedure, he will come to the office the day before to reassess his ECG to make certain he has not neurologically converted to sinus rhythm.  Laboratory will be obtained.  He is diabetic on metformin and Trulicity.  He is on levothyroxine 75 mcg for hypothyroidism.    I discussed the risk benefits of DC cardioversion and he agrees to proceed if necessary.  Since I last saw him in the office, he had been scheduled for his outpatient cardioversion for September 08, 2018.  The morning of the procedure, he developed a fever and called and canceled his procedure. He subsequently had seen his primary physician who prescribed antibiotics.  Presently he has felt well.  He was rescheduled for his outpatient DC cardioversion  today.  His ECG confirms that he continues to be in persistent atrial fibrillation with ventricular rate in the 80s.  Blood pressure is stable.  There is no JVD.  His lungs are clear.  Rhythm is irregularly irregular with 1/6 systolic murmur.  Abdomen soft nontender.  He does not have any edema.  Neurologic exam is grossly nonfocal.  I again reviewed the plan for cardioversion.  We will proceed.  He is aware the risk benefits of the procedure.  Shelva Majestic, MD, [facility] 9:38 AM  09/26/2018

## 2018-10-03 DIAGNOSIS — J208 Acute bronchitis due to other specified organisms: Secondary | ICD-10-CM | POA: Diagnosis not present

## 2018-10-03 DIAGNOSIS — R61 Generalized hyperhidrosis: Secondary | ICD-10-CM | POA: Diagnosis not present

## 2018-10-14 ENCOUNTER — Encounter: Payer: Self-pay | Admitting: *Deleted

## 2018-10-14 DIAGNOSIS — Z006 Encounter for examination for normal comparison and control in clinical research program: Secondary | ICD-10-CM

## 2018-10-14 NOTE — Research (Signed)
LEADERS FREE II Research study 36 month follow up completed. Patient doing much better since cardioversion. Denies Chest pain. Medications unchanged as listed. This concludes the follow up for the study. Patient informed that no safety issues have been identified and zero unanticipated adverse device events were reported. I informed patient when results of available the research office will update pt. I thanked him for his participation.

## 2018-10-29 DIAGNOSIS — E785 Hyperlipidemia, unspecified: Secondary | ICD-10-CM | POA: Diagnosis not present

## 2018-10-29 DIAGNOSIS — I129 Hypertensive chronic kidney disease with stage 1 through stage 4 chronic kidney disease, or unspecified chronic kidney disease: Secondary | ICD-10-CM | POA: Diagnosis not present

## 2018-10-29 DIAGNOSIS — E1129 Type 2 diabetes mellitus with other diabetic kidney complication: Secondary | ICD-10-CM | POA: Diagnosis not present

## 2018-10-29 DIAGNOSIS — E039 Hypothyroidism, unspecified: Secondary | ICD-10-CM | POA: Diagnosis not present

## 2018-10-30 DIAGNOSIS — E1129 Type 2 diabetes mellitus with other diabetic kidney complication: Secondary | ICD-10-CM | POA: Diagnosis not present

## 2018-10-30 DIAGNOSIS — Z79899 Other long term (current) drug therapy: Secondary | ICD-10-CM | POA: Diagnosis not present

## 2018-10-30 DIAGNOSIS — E039 Hypothyroidism, unspecified: Secondary | ICD-10-CM | POA: Diagnosis not present

## 2018-10-30 DIAGNOSIS — E785 Hyperlipidemia, unspecified: Secondary | ICD-10-CM | POA: Diagnosis not present

## 2018-11-12 ENCOUNTER — Telehealth: Payer: Self-pay | Admitting: Cardiovascular Disease

## 2018-11-12 NOTE — Telephone Encounter (Signed)
° ° °  Pt c/o medication issue:  1. Name of Medication:amiodarone (PACERONE) 200 MG tablet  2. How are you currently taking this medication (dosage and times per day)? As written  3. Are you having a reaction (difficulty breathing--STAT)? no  4. What is your medication issue? Patient states he has "upset stomach" and vomits, for the last 4 days. Patient states he was ok until medication was increased. Patient did not take dose today

## 2018-11-12 NOTE — Telephone Encounter (Signed)
Spoke with the pt. The pt sts that he does not think he is able to tolerate the increased dosage of Amiodarone. After his dccv on 09/26/18 Dr. Claiborne Billings increased Amio from 200mg  qd to bid. The pt reports having n/v the last 4 nights at bedtimes. Pt denies any other symptoms. He usual  feels sick to his stomach after taking each dose. He does take the medication with a meal.  He has not taken Amio today and feels better. He sts that the symptoms are worse by time he is to take his pm dose.  Adv the pt to go ahead and take one 200mg  with his lunch and to hold his pm dose. I will fwd a message to Dr. Claiborne Billings for further recommendation. Pt agreeable with the plan and voiced appreciation for the call.

## 2018-11-12 NOTE — Telephone Encounter (Signed)
Try reducing to 300 mg daily, and if cannot tolerate then resume 200 mg

## 2018-11-13 ENCOUNTER — Other Ambulatory Visit: Payer: Self-pay | Admitting: Cardiovascular Disease

## 2018-11-13 NOTE — Telephone Encounter (Signed)
pacerone refilled.

## 2018-11-14 MED ORDER — AMIODARONE HCL 200 MG PO TABS: 300.0000 mg | ORAL_TABLET | Freq: Every day | ORAL | 6 refills | Status: DC

## 2018-11-14 NOTE — Telephone Encounter (Signed)
Spoke with pt. Instructions given to take 300mg  Daily and may decrease it unable to tolerate. Pt voiced understanding.

## 2018-11-17 ENCOUNTER — Telehealth: Payer: Self-pay | Admitting: *Deleted

## 2018-11-17 NOTE — Telephone Encounter (Signed)
Opened in error

## 2018-11-27 DIAGNOSIS — E1129 Type 2 diabetes mellitus with other diabetic kidney complication: Secondary | ICD-10-CM | POA: Diagnosis not present

## 2018-11-27 DIAGNOSIS — H9193 Unspecified hearing loss, bilateral: Secondary | ICD-10-CM | POA: Diagnosis not present

## 2018-12-08 DIAGNOSIS — K5900 Constipation, unspecified: Secondary | ICD-10-CM | POA: Diagnosis not present

## 2018-12-08 DIAGNOSIS — K5909 Other constipation: Secondary | ICD-10-CM | POA: Diagnosis not present

## 2018-12-09 DIAGNOSIS — Z1331 Encounter for screening for depression: Secondary | ICD-10-CM | POA: Diagnosis not present

## 2018-12-09 DIAGNOSIS — Z0000 Encounter for general adult medical examination without abnormal findings: Secondary | ICD-10-CM | POA: Diagnosis not present

## 2018-12-09 DIAGNOSIS — Z9181 History of falling: Secondary | ICD-10-CM | POA: Diagnosis not present

## 2018-12-09 DIAGNOSIS — Z125 Encounter for screening for malignant neoplasm of prostate: Secondary | ICD-10-CM | POA: Diagnosis not present

## 2018-12-09 DIAGNOSIS — E785 Hyperlipidemia, unspecified: Secondary | ICD-10-CM | POA: Diagnosis not present

## 2018-12-09 DIAGNOSIS — Z136 Encounter for screening for cardiovascular disorders: Secondary | ICD-10-CM | POA: Diagnosis not present

## 2018-12-16 DIAGNOSIS — I129 Hypertensive chronic kidney disease with stage 1 through stage 4 chronic kidney disease, or unspecified chronic kidney disease: Secondary | ICD-10-CM | POA: Diagnosis not present

## 2018-12-16 DIAGNOSIS — E11649 Type 2 diabetes mellitus with hypoglycemia without coma: Secondary | ICD-10-CM | POA: Diagnosis not present

## 2018-12-17 DIAGNOSIS — R531 Weakness: Secondary | ICD-10-CM | POA: Diagnosis not present

## 2018-12-17 DIAGNOSIS — Z951 Presence of aortocoronary bypass graft: Secondary | ICD-10-CM | POA: Diagnosis not present

## 2018-12-17 DIAGNOSIS — I10 Essential (primary) hypertension: Secondary | ICD-10-CM | POA: Diagnosis not present

## 2018-12-17 DIAGNOSIS — E7800 Pure hypercholesterolemia, unspecified: Secondary | ICD-10-CM | POA: Diagnosis not present

## 2018-12-17 DIAGNOSIS — E119 Type 2 diabetes mellitus without complications: Secondary | ICD-10-CM | POA: Diagnosis not present

## 2018-12-18 DIAGNOSIS — I129 Hypertensive chronic kidney disease with stage 1 through stage 4 chronic kidney disease, or unspecified chronic kidney disease: Secondary | ICD-10-CM | POA: Diagnosis not present

## 2018-12-18 DIAGNOSIS — E1129 Type 2 diabetes mellitus with other diabetic kidney complication: Secondary | ICD-10-CM | POA: Diagnosis not present

## 2018-12-25 DIAGNOSIS — E039 Hypothyroidism, unspecified: Secondary | ICD-10-CM | POA: Diagnosis not present

## 2018-12-25 DIAGNOSIS — R600 Localized edema: Secondary | ICD-10-CM | POA: Diagnosis not present

## 2018-12-25 DIAGNOSIS — I129 Hypertensive chronic kidney disease with stage 1 through stage 4 chronic kidney disease, or unspecified chronic kidney disease: Secondary | ICD-10-CM | POA: Diagnosis not present

## 2018-12-28 ENCOUNTER — Other Ambulatory Visit: Payer: Self-pay | Admitting: Cardiovascular Disease

## 2019-01-30 DIAGNOSIS — I129 Hypertensive chronic kidney disease with stage 1 through stage 4 chronic kidney disease, or unspecified chronic kidney disease: Secondary | ICD-10-CM | POA: Diagnosis not present

## 2019-01-30 DIAGNOSIS — E785 Hyperlipidemia, unspecified: Secondary | ICD-10-CM | POA: Diagnosis not present

## 2019-01-30 DIAGNOSIS — E039 Hypothyroidism, unspecified: Secondary | ICD-10-CM | POA: Diagnosis not present

## 2019-01-30 DIAGNOSIS — Z79899 Other long term (current) drug therapy: Secondary | ICD-10-CM | POA: Diagnosis not present

## 2019-01-30 DIAGNOSIS — E1129 Type 2 diabetes mellitus with other diabetic kidney complication: Secondary | ICD-10-CM | POA: Diagnosis not present

## 2019-02-09 DIAGNOSIS — I129 Hypertensive chronic kidney disease with stage 1 through stage 4 chronic kidney disease, or unspecified chronic kidney disease: Secondary | ICD-10-CM | POA: Diagnosis not present

## 2019-02-09 DIAGNOSIS — R600 Localized edema: Secondary | ICD-10-CM | POA: Diagnosis not present

## 2019-02-13 DIAGNOSIS — E1165 Type 2 diabetes mellitus with hyperglycemia: Secondary | ICD-10-CM | POA: Diagnosis not present

## 2019-02-13 DIAGNOSIS — E1129 Type 2 diabetes mellitus with other diabetic kidney complication: Secondary | ICD-10-CM | POA: Diagnosis not present

## 2019-02-13 DIAGNOSIS — R600 Localized edema: Secondary | ICD-10-CM | POA: Diagnosis not present

## 2019-02-18 ENCOUNTER — Telehealth: Payer: Self-pay | Admitting: Cardiovascular Disease

## 2019-02-18 NOTE — Telephone Encounter (Signed)
Returned call to patient of Dr. Claiborne Billings who reports swelling in leg, feet, ankles and some in stomach. He was previously wearing compression stockings but he quit b/c he states "the bigger they are, the more pressure is on them". He sleeps with legs propped on pillows. He reports his swelling has improved some b/c of this. He would like to see a provider to assess his edema and review his medications.  He is taking lasix 40mg  twice daily.  He denies SOB He does not endorse weight gain  His PCP has been following him routinely  Scheduled him for [place] on 7/17 @ 3:15pm      COVID-19 Pre-Screening Questions:  . In the past 7 to 10 days have you had a cough,  shortness of breath, headache, congestion, fever (100 or greater) body aches, chills, sore throat, or sudden loss of taste or sense of smell? NO . Have you been around anyone with known Covid 19? NO . Have you been around anyone who is awaiting Covid 19 test results in the past 7 to 10 days? NO . Have you been around anyone who has been exposed to Covid 19, or has mentioned symptoms of Covid 19 within the past 7 to 10 days? NO  If you have any concerns/questions about symptoms patients report during screening (either on the phone or at threshold). Contact the provider seeing the patient or DOD for further guidance.  If neither are available contact a member of the leadership team.

## 2019-02-18 NOTE — Telephone Encounter (Signed)
Sent to MD as FYI

## 2019-02-18 NOTE — Telephone Encounter (Signed)
Pt c/o swelling: STAT is pt has developed SOB within 24 hours  1) How much weight have you gained and in what time span? alittle  2) If swelling, where is the swelling located? Feet and ankles  3) Are you currently taking a fluid pill? Yes   4) Are you currently SOB? No   5) Do you have a log of your daily weights (if so, list)? NO   6) Have you gained 3 pounds in a day or 5 pounds in a week? NO   7) Have you traveled recently? no

## 2019-02-20 ENCOUNTER — Other Ambulatory Visit: Payer: Self-pay

## 2019-02-20 ENCOUNTER — Encounter (INDEPENDENT_AMBULATORY_CARE_PROVIDER_SITE_OTHER): Payer: Self-pay

## 2019-02-20 ENCOUNTER — Encounter: Payer: Self-pay | Admitting: Physician Assistant

## 2019-02-20 ENCOUNTER — Ambulatory Visit (INDEPENDENT_AMBULATORY_CARE_PROVIDER_SITE_OTHER): Payer: Medicare Other | Admitting: Physician Assistant

## 2019-02-20 VITALS — BP 148/68 | HR 54 | Ht 67.5 in | Wt 224.4 lb

## 2019-02-20 DIAGNOSIS — I480 Paroxysmal atrial fibrillation: Secondary | ICD-10-CM | POA: Diagnosis not present

## 2019-02-20 DIAGNOSIS — I5043 Acute on chronic combined systolic (congestive) and diastolic (congestive) heart failure: Secondary | ICD-10-CM

## 2019-02-20 DIAGNOSIS — I2510 Atherosclerotic heart disease of native coronary artery without angina pectoris: Secondary | ICD-10-CM

## 2019-02-20 DIAGNOSIS — E785 Hyperlipidemia, unspecified: Secondary | ICD-10-CM

## 2019-02-20 DIAGNOSIS — I10 Essential (primary) hypertension: Secondary | ICD-10-CM | POA: Diagnosis not present

## 2019-02-20 DIAGNOSIS — I25810 Atherosclerosis of coronary artery bypass graft(s) without angina pectoris: Secondary | ICD-10-CM | POA: Diagnosis not present

## 2019-02-20 DIAGNOSIS — E119 Type 2 diabetes mellitus without complications: Secondary | ICD-10-CM | POA: Diagnosis not present

## 2019-02-20 MED ORDER — METOLAZONE 2.5 MG PO TABS: 2.5000 mg | ORAL_TABLET | Freq: Every day | ORAL | 0 refills | Status: DC

## 2019-02-20 NOTE — Progress Notes (Signed)
Cardiology Office Note    Date:  02/22/2019   ID:  Roberto Haley, DOB [birth year], MRN [id number]  PCP:  Nicoletta Dress, MD  Cardiologist:  Dr. Claiborne Billings  Chief Complaint  Patient presents with  . Follow-up    seen for Dr. Claiborne Billings.     History of Present Illness:  Roberto Haley is a 78 y.o. male with PMH of hypertension, hyperlipidemia, DM 2, history of PVCs, subdural hematoma in July 2014 after fall, stage III CKD, PAF, CAD s/p CABG 1997 with LIMA to LAD, SVG to RCA.  He had a stenting of LAD and PTCA of diagonal vessel in March 1998 after found to have severe stenosis in the LIMA graft.  Cardiac catheterization in June 2013 revealed 99% in-stent restenosis in the LAD stent and a new stenosis in the distal left circumflex, he underwent two-vessel intervention with insertion of stent in the LAD and PTCA of distal left circumflex.  Cardiopulmonary stress test showed reduced maximum oxygen consumption.  Beta-blocker therapy was cut back due to significant chronotropic incompetence.  Myoview in June 2014 showed distal anteroseptal scar without significant ischemia.  Myoview in April 2017 showed EF 42% with inferolateral hypokinesis, new large defect in the inferolateral wall, no change in his previous anterior septal defect.  He underwent cardiac catheterization and found to have significant native CAD with patent stent from mid left main into the proximal LAD, 50% stenosis in the LAD after the first diagonal, new total occlusion in the proximal native RCA with faint bridging collaterals, vein graft supplying PDA and PLA were patent however he had new disease in the mid PDA which was treated with [employer] stent.  Due to history of intracranial bleed, he was transitioned to single antiplatelet therapy after 30 days rather than dual antiplatelet therapy.  Echocardiogram obtained on 05/28/2018 showed EF 45 to 50%, diffuse hypokinesis, mild MR.    He was diagnosed with new atrial fibrillation in  October 2019.  Eliquis was started and Plavix was removed.  He was started on amiodarone therapy in December 2019.  Toprol-XL was reduced in dosage due to bradycardia.  He underwent successful cardioversion on 09/26/2018.  In the past 6 months, he has been noticing increasing swelling in his leg, ankle, and also in the stomach.  He denies any chest pain however on physical exam he has at least 2-3+ pitting edema.  His lower extremity skin is very taunt and does not press down.  Looking at his weight, he has gained roughly 30 pounds since earlier this year.  I plan to start him on 2.5 mg daily of metolazone for the next 2 days to help initiate the diuresis.  I will see him back in 5 days for reassessment.  I will also start him on 20 meq twice a day of potassium supplement.  We discussed the importance of salt restriction, fluid restriction and daily weight.    Past Medical History:  Diagnosis Date  . Arthritis   . Chronic kidney disease    CKD STAGE 3;  CHRONIC RENAL INSUFFICIENCY  . Coronary artery disease   . Diabetes mellitus   . Groin hematoma 02/19/12   RIGHT  . Hyperlipidemia   . Hypertension   . Neuropathy due to type 2 diabetes mellitus ([place])   . Obesity   . PVC (premature ventricular contraction)   . SDH (subdural hematoma) (HCC) 02/06/2013   R parafalcine SDH after a fall, rx at [facility]    Past Surgical  History:  Procedure Laterality Date  . CARDIAC CATHETERIZATION  01/14/12   LV FXN EF 50-55% W/MILD DISTAL INFERIOR HYPOKINESIS; PCI: LAD PTCA/STENT W/NEW 2.75X22 RESOLUTE DES IN MID LAD POST DIALTED TO 3.08 TO 2.97 TAPER: 99%-80% TO 0; LCX: PTCA VIA LM STENT W/2.25X12 SPRINTER BALLOON: 90%-5; LAD: PATENT PROXIMAL STENT EXTENDING INTO LM W/30-40% SMOOTH NARROWING IN DISTAL PORTION OF STENT; 99% IN STENT RESTENOSIS OF MID LAD STENT   . CARDIAC CATHETERIZATION N/A 12/02/2015   Procedure: Left Heart Cath and Coronary Angiography;  Surgeon: Troy Sine, MD;  Location: [place] [facility];   Service: Cardiovascular;  Laterality: N/A;  . CARDIAC CATHETERIZATION N/A 12/02/2015   Procedure: Coronary Stent Intervention;  Surgeon: Troy Sine, MD;  Location: [place] [facility];  Service: Cardiovascular;  Laterality: N/A;  . CARDIOVERSION N/A 09/26/2018   Procedure: CARDIOVERSION;  Surgeon: Troy Sine, MD;  Location: [facility] ENDOSCOPY;  Service: Cardiovascular;  Laterality: N/A;  . CORONARY ARTERY BYPASS GRAFT  1997   LIMA to the LAD and vein to the RCA;  . CORONARY STENT PLACEMENT  12/02/2015   PAD  . DOPPLER ECHOCARDIOGRAPHY  01/09/12   LV NORMAL IN SIZE, NORMAL WAL THICKNESS, EF 50-55%; MILD POSTERIOR WALL HYOPKINESIS, THERE IS MILD INFERIOR WALL HYPOKINESIS  . HYPOTHENAR FAT PAD TRANSFER    . LEFT HEART CATHETERIZATION WITH CORONARY/GRAFT ANGIOGRAM N/A 01/14/2012   Procedure: LEFT HEART CATHETERIZATION WITH Beatrix Fetters;  Surgeon: Troy Sine, MD;  Location: [facility] CATH LAB;  Service: Cardiovascular;  Laterality: N/A;  . LOWER ARTERIAL DOPPLER  01/24/12   ESSENTIALLY NORMAL RIGHT GROIN DUPLEX EVALUATION S/P THROMBIN INJECTION  . LOWER VENOUS DUPLEX  02/05/12   ESSENTIALLY NORMAL RIGHT LOWER EXTRIMTY VENOUS DUPLEX DOPPLER EVALUATION  . NM MYOVIEW LTD  01/09/12   HIGH RISK SCAN. COMPARED TO PREVIOUS STUDY, THERE IS NOW ISCHEMIA PRESENT. ABNORMAL MYOCARDIAL PERFUSION STUDY. THERE IS NEW MILD INFEROLATERAL ISCHEMIA TOWARDS THE BASE; PT TO FOLLOW UP WITH DR. [name]  . PERCUTANEOUS CORONARY STENT INTERVENTION (PCI-S) N/A 01/14/2012   Procedure: PERCUTANEOUS CORONARY STENT INTERVENTION (PCI-S);  Surgeon: Troy Sine, MD;  Location: [facility] CATH LAB;  Service: Cardiovascular;  Laterality: N/A;    Current Medications: Outpatient Medications Prior to Visit  Medication Sig Dispense Refill  . amiodarone (PACERONE) 200 MG tablet Take 1.5 tablets (300 mg total) by mouth daily. 45 tablet 6  . atorvastatin (LIPITOR) 40 MG tablet Take 1 tablet (40 mg total) by mouth daily. 90 tablet 3  .  benazepril (LOTENSIN) 20 MG tablet TAKE 1 TABLET(20 MG) BY MOUTH DAILY 90 tablet 3  . cholecalciferol (VITAMIN D) 1000 UNITS tablet Take 1,000 Units by mouth daily.    . clopidogrel (PLAVIX) 75 MG tablet TAKE 1 TABLET(75 MG) BY MOUTH DAILY WITH BREAKFAST 90 tablet 3  . ELIQUIS 5 MG TABS tablet TAKE 1 TABLET(5 MG) BY MOUTH TWICE DAILY 180 tablet 1  . famotidine (PEPCID) 20 MG tablet Take 20 mg by mouth daily.    . furosemide (LASIX) 40 MG tablet Take one tablet daily. may take one extra tablet daily for 3 days for swelling (Patient taking differently: Take 40 mg by mouth See admin instructions. Take one tablet daily. may take one extra tablet daily for 3 days for swelling) 100 tablet 3  . hydrALAZINE (APRESOLINE) 25 MG tablet Take 25 mg by mouth 2 (two) times daily.    . insulin aspart (NOVOLOG) 100 UNIT/ML FlexPen Inject 12 Units into the skin 2 (two) times daily  with a meal.     . isosorbide mononitrate (IMDUR) 60 MG 24 hr tablet TAKE 1 AND 1/2 TABLETS BY MOUTH EVERY MORNING AND 1/2 TABLET EVERY EVENING 180 tablet 3  . LEVEMIR FLEXTOUCH 100 UNIT/ML Pen Inject 42 Units into the skin 2 (two) times daily.   12  . LINZESS 145 MCG CAPS capsule Take 145 mcg by mouth daily before breakfast.   11  . metFORMIN (GLUCOPHAGE-XR) 750 MG 24 hr tablet Take 750 mg by mouth daily with breakfast.    . metoprolol succinate (TOPROL-XL) 50 MG 24 hr tablet Take 25 mg by mouth daily. Take with or immediately following a meal.    . Multiple Vitamin (MULTIVITAMIN WITH MINERALS) TABS Take 1 tablet by mouth daily.    . nitroGLYCERIN (NITROSTAT) 0.4 MG SL tablet Place 1 tablet (0.4 mg total) under the tongue every 5 (five) minutes as needed. For chest pain 25 tablet 12  . NOVOFINE AUTOCOVER 30G X 8 MM MISC     . ONE TOUCH ULTRA TEST test strip   4  . oxybutynin (DITROPAN) 5 MG tablet Take 1 tablet by mouth daily.    Marland Kitchen SYNTHROID 75 MCG tablet Take 75 mcg by mouth daily before breakfast.     . TRULICITY 1.5 VP/7.1GG SOPN  Inject 1.5 mg as directed once a week.   12  . vitamin C (ASCORBIC ACID) 500 MG tablet Take 500 mg by mouth daily.     No facility-administered medications prior to visit.      Allergies:   Diltiazem, Lovastatin, Proton pump inhibitors, Tramadol, and Codeine   Social History   Socioeconomic History  . Marital status: Married    Spouse name: Not on file  . Number of children: 1  . Years of education: Not on file  . Highest education level: Not on file  Occupational History  . Not on file  Social Needs  . Financial resource strain: Not on file  . Food insecurity    Worry: Not on file    Inability: Not on file  . Transportation needs    Medical: Not on file    Non-medical: Not on file  Tobacco Use  . Smoking status: Never Smoker  . Smokeless tobacco: Never Used  Substance and Sexual Activity  . Alcohol use: No    Alcohol/week: 0.0 standard drinks  . Drug use: No  . Sexual activity: Not on file  Lifestyle  . Physical activity    Days per week: Not on file    Minutes per session: Not on file  . Stress: Not on file  Relationships  . Social Herbalist on phone: Not on file    Gets together: Not on file    Attends religious service: Not on file    Active member of club or organization: Not on file    Attends meetings of clubs or organizations: Not on file    Relationship status: Not on file  Other Topics Concern  . Not on file  Social History Narrative   COULD NOT FIND ANY INFO ABOUT FAMILY HISTORY      Family History:  The patient's family history includes Cancer in his father; Diabetes in his paternal aunt.   ROS:   Please see the history of present illness.    ROS All other systems reviewed and are negative.   PHYSICAL EXAM:   VS:  BP (!) 148/68   Pulse (!) 54   Ht 5' 7.5" (1.715  m)   Wt 224 lb 6.4 oz (101.8 kg)   SpO2 96%   BMI 34.63 kg/m    GEN: Well nourished, well developed, in no acute distress  HEENT: normal  Neck: no JVD, carotid  bruits, or masses Cardiac: RRR; no murmurs, rubs, or gallops. 2+ pitting edema  Respiratory:  clear to auscultation bilaterally, normal work of breathing GI: soft, nontender, nondistended, + BS MS: no deformity or atrophy  Skin: warm and dry, no rash Neuro:  Alert and Oriented x 3, Strength and sensation are intact Psych: euthymic mood, full affect  Wt Readings from Last 3 Encounters:  02/20/19 224 lb 6.4 oz (101.8 kg)  09/26/18 179 lb (81.2 kg)  08/18/18 195 lb 6.4 oz (88.6 kg)      Studies/Labs Reviewed:   EKG:  EKG is ordered today.  The ekg ordered today demonstrates sinus bradycardia with 1st degree AV block  Recent Labs: 06/02/2018: ALT 31; Magnesium 1.9; TSH 1.890 09/22/2018: Hemoglobin 12.2; Platelets 205 02/20/2019: BUN 27; Creatinine, Ser 1.93; Potassium 4.5; Sodium 135   Lipid Panel    Component Value Date/Time   CHOL 97 (L) 06/02/2018 0811   TRIG 78 06/02/2018 0811   HDL 34 (L) 06/02/2018 0811   CHOLHDL 2.9 06/02/2018 0811   LDLCALC 47 06/02/2018 0811    Additional studies/ records that were reviewed today include:   Echo 05/28/2018 LV EF: 45% -   50% Study Conclusions  - Left ventricle: The cavity size was normal. There was moderate   concentric hypertrophy. Systolic function was mildly reduced. The   estimated ejection fraction was in the range of 45% to 50%. Mild   diffuse hypokinesis. The study was not technically sufficient to   allow evaluation of LV diastolic dysfunction due to atrial   fibrillation. - Aortic valve: Trileaflet; moderately thickened, moderately   calcified leaflets. There was trivial regurgitation. - Mitral valve: There was mild regurgitation. - Left atrium: The atrium was mildly dilated. - Right ventricle: Systolic function was mildly reduced. - Tricuspid valve: There was mild regurgitation. - Pulmonic valve: There was trivial regurgitation.    ASSESSMENT:    1. Acute on chronic combined systolic and diastolic CHF  (congestive heart failure) ([place])   2. Coronary artery disease involving coronary bypass graft of native heart without angina pectoris   3. PAF (paroxysmal atrial fibrillation) ([place])   4. Essential hypertension   5. Hyperlipidemia LDL goal <70   6. Controlled type 2 diabetes mellitus without complication, without long-term current use of insulin (HCC)      PLAN:  In order of problems listed above:  1. Acute on chronic combined systolic and diastolic heart failure: Continue on 40 mg twice daily of Lasix, added 2.5 mg metolazone x2 days.  Add 20 mEq BID of potassium.  I plan to see the patient back in 5 days for follow-up.  2. CAD s/p CABG: Denies any recent chest discomfort.  Last echocardiogram obtained on 05/28/2018 showed EF 45 to 50%.  3. PAF: Continue Eliquis and metoprolol  4. Hypertension: Blood pressure stable  5. Hyperlipidemia: Continue Lipitor  6. DM2: Managed by primary care provider    Medication Adjustments/Labs and Tests Ordered: Current medicines are reviewed at length with the patient today.  Concerns regarding medicines are outlined above.  Medication changes, Labs and Tests ordered today are listed in the Patient Instructions below. Patient Instructions  Medication Instructions:   START METOLAZONE 2.5 MG FOR 2 DAYS-TAKE 30 MINUTES BEFORE LASIX  CONTINUE LASIX  40 MG 2 TIMES A DAY If you need a refill on your cardiac medications before your next appointment, please call your pharmacy.   Lab work: You will need to have labs (blood work) drawn today and again in 1 week:  BMET  If you have labs (blood work) drawn today and your tests are completely normal, you will receive your results only by: Marland Kitchen MyChart Message (if you have MyChart) OR . A paper copy in the mail If you have any lab test that is abnormal or we need to change your treatment, we will call you to review the results.  Testing/Procedures: NONE ordered at this time of appointment   Follow-Up:  At [facility], you and your health needs are our priority.  As part of our continuing mission to provide you with exceptional heart care, we have created designated Provider Care Teams.  These Care Teams include your primary Cardiologist (physician) and Advanced Practice Providers (APPs -  Physician Assistants and Nurse Practitioners) who all work together to provide you with the care you need, when you need it. . Your physician recommends that you schedule a follow-up appointment in 5 days with Almyra Deforest, PA-C on Thursday February 26, 2019 at 2:45PM    Any Other Special Instructions Will Be Listed Below (If Applicable).  Limit Salt intake  Drink no more than 32-64 oz fluid in a day  Weigh yourself daily, call cardiology if weight increase by more than 3 lbs overnight or 5 lbs in a single week    Hilbert Corrigan, [place]  02/22/2019 10:26 PM    [facility] [address], [place], [place]  [address] Phone: [phone]; Fax: [phone]

## 2019-02-20 NOTE — Patient Instructions (Addendum)
Medication Instructions:   START METOLAZONE 2.5 MG FOR 2 DAYS-TAKE 30 MINUTES BEFORE LASIX  CONTINUE LASIX 40 MG 2 TIMES A DAY  START POTASSIUM 20 meq DAILY AS NEEDED   If you need a refill on your cardiac medications before your next appointment, please call your pharmacy.   Lab work: You will need to have labs (blood work) drawn today and again in 1 week:  BMET  If you have labs (blood work) drawn today and your tests are completely normal, you will receive your results only by: Marland Kitchen MyChart Message (if you have MyChart) OR . A paper copy in the mail If you have any lab test that is abnormal or we need to change your treatment, we will call you to review the results.  Testing/Procedures: NONE ordered at this time of appointment   Follow-Up: At [facility], you and your health needs are our priority.  As part of our continuing mission to provide you with exceptional heart care, we have created designated Provider Care Teams.  These Care Teams include your primary Cardiologist (physician) and Advanced Practice Providers (APPs -  Physician Assistants and Nurse Practitioners) who all work together to provide you with the care you need, when you need it. . Your physician recommends that you schedule a follow-up appointment in 5 days with Almyra Deforest, PA-C on Thursday February 26, 2019 at 2:45PM    Any Other Special Instructions Will Be Listed Below (If Applicable).  Limit Salt intake  Drink no more than 32-64 oz fluid in a day  Weigh yourself daily, call cardiology if weight increase by more than 3 lbs overnight or 5 lbs in a single week

## 2019-02-21 LAB — BASIC METABOLIC PANEL
BUN/Creatinine Ratio: 14 (ref 10–24)
BUN: 27 mg/dL (ref 8–27)
CO2: 25 mmol/L (ref 20–29)
Calcium: 9 mg/dL (ref 8.6–10.2)
Chloride: 94 mmol/L — ABNORMAL LOW (ref 96–106)
Creatinine, Ser: 1.93 mg/dL — ABNORMAL HIGH (ref 0.76–1.27)
GFR calc Af Amer: 38 mL/min/{1.73_m2} — ABNORMAL LOW (ref >59)
GFR calc non Af Amer: 33 mL/min/{1.73_m2} — ABNORMAL LOW (ref >59)
Glucose: 150 mg/dL — ABNORMAL HIGH (ref 65–99)
Potassium: 4.5 mmol/L (ref 3.5–5.2)
Sodium: 135 mmol/L (ref 134–144)

## 2019-02-22 ENCOUNTER — Encounter: Payer: Self-pay | Admitting: Physician Assistant

## 2019-02-22 NOTE — Progress Notes (Signed)
Renal function slightly worse than 5 month ago, pending repeat on followup

## 2019-02-23 MED ORDER — POTASSIUM CHLORIDE CRYS ER 20 MEQ PO TBCR: EXTENDED_RELEASE_TABLET | ORAL | 1 refills | Status: DC

## 2019-02-23 NOTE — Addendum Note (Signed)
Addended by: Jacqulynn Cadet on: 02/23/2019 10:19 AM   Modules accepted: Orders

## 2019-02-23 NOTE — Progress Notes (Signed)
The patient has been notified of the result and verbalized understanding.  All questions (if any) were answered. Jacqulynn Cadet, [place] 02/23/2019 4:51 PM

## 2019-02-26 ENCOUNTER — Other Ambulatory Visit: Payer: Self-pay

## 2019-02-26 ENCOUNTER — Ambulatory Visit (INDEPENDENT_AMBULATORY_CARE_PROVIDER_SITE_OTHER): Payer: Medicare Other | Admitting: Physician Assistant

## 2019-02-26 ENCOUNTER — Telehealth: Payer: Self-pay | Admitting: Physician Assistant

## 2019-02-26 ENCOUNTER — Encounter: Payer: Self-pay | Admitting: Physician Assistant

## 2019-02-26 VITALS — BP 128/66 | HR 58 | Temp 97.7°F | Ht 67.0 in | Wt 212.0 lb

## 2019-02-26 DIAGNOSIS — I480 Paroxysmal atrial fibrillation: Secondary | ICD-10-CM | POA: Diagnosis not present

## 2019-02-26 DIAGNOSIS — I2510 Atherosclerotic heart disease of native coronary artery without angina pectoris: Secondary | ICD-10-CM | POA: Diagnosis not present

## 2019-02-26 DIAGNOSIS — I2583 Coronary atherosclerosis due to lipid rich plaque: Secondary | ICD-10-CM | POA: Diagnosis not present

## 2019-02-26 DIAGNOSIS — I5043 Acute on chronic combined systolic (congestive) and diastolic (congestive) heart failure: Secondary | ICD-10-CM

## 2019-02-26 DIAGNOSIS — E785 Hyperlipidemia, unspecified: Secondary | ICD-10-CM | POA: Diagnosis not present

## 2019-02-26 DIAGNOSIS — E119 Type 2 diabetes mellitus without complications: Secondary | ICD-10-CM

## 2019-02-26 DIAGNOSIS — I10 Essential (primary) hypertension: Secondary | ICD-10-CM

## 2019-02-26 MED ORDER — METOLAZONE 10 MG PO TABS: 10.0000 mg | ORAL_TABLET | Freq: Every day | ORAL | 0 refills | Status: DC

## 2019-02-26 NOTE — Progress Notes (Signed)
Cardiology Office Note    Date:  02/27/2019   ID:  HILLERY BHALLA, DOB [birth year], MRN [id number]  PCP:  Nicoletta Dress, MD  Cardiologist:  Dr. Claiborne Billings  Chief Complaint  Patient presents with  . Follow-up    seen for Dr. Claiborne Billings.     History of Present Illness:  Roberto Haley is a 78 y.o. male with PMH of hypertension, hyperlipidemia, DM 2, history of PVCs, subdural hematoma in July 2014 after fall, stage III CKD, PAF, CAD s/p CABG 1997 with LIMA to LAD, SVG to RCA.  He had a stenting of LAD and PTCA of diagonal vessel in March 1998 after found to have severe stenosis in the LIMA graft.  Cardiac catheterization in June 2013 revealed 99% in-stent restenosis in the LAD stent and a new stenosis in the distal left circumflex, he underwent two-vessel intervention with insertion of stent in the LAD and PTCA of distal left circumflex.  Cardiopulmonary stress test showed reduced maximum oxygen consumption.  Beta-blocker therapy was cut back due to significant chronotropic incompetence.  Myoview in June 2014 showed distal anteroseptal scar without significant ischemia.  Myoview in April 2017 showed EF 42% with inferolateral hypokinesis, new large defect in the inferolateral wall, no change in his previous anterior septal defect.  He underwent cardiac catheterization and found to have significant native CAD with patent stent from mid left main into the proximal LAD, 50% stenosis in the LAD after the first diagonal, new total occlusion in the proximal native RCA with faint bridging collaterals, vein graft supplying PDA and PLA were patent however he had new disease in the mid PDA which was treated with [employer] stent.  Due to history of intracranial bleed, he was transitioned to single antiplatelet therapy after 30 days rather than dual antiplatelet therapy.  Echocardiogram obtained on 05/28/2018 showed EF 45 to 50%, diffuse hypokinesis, mild MR.   Patient was diagnosed with new atrial fibrillation in  October 2019.  Eliquis was started and Plavix was removed.  He was started on amiodarone therapy in December 2019.  Toprol-XL was reduced due to bradycardia.  He underwent successful cardioversion in February 2020.  I last saw the patient on 02/20/2019, for the past 6 months he has been noticing increased swelling in his legs, ankle and the stomach.  He had 2-3+ pitting edema on physical exam.  I started him on 2.5 mg daily of metolazone for 2 days.  Patient presents today for cardiology office visit.  He managed to lose 15 pounds since last week.  His lower extremity edema still remain in 2-3+ on physical exam however the skin turgor is less tight and he is clearly losing excess fluid.  His abdominal distention has also improved as well.  I recommended 2 more days of metolazone.  He will need a basic metabolic panel today and I will see him back in 2 weeks.   Past Medical History:  Diagnosis Date  . Arthritis   . Chronic kidney disease    CKD STAGE 3;  CHRONIC RENAL INSUFFICIENCY  . Coronary artery disease   . Diabetes mellitus   . Groin hematoma 02/19/12   RIGHT  . Hyperlipidemia   . Hypertension   . Neuropathy due to type 2 diabetes mellitus ([place])   . Obesity   . PVC (premature ventricular contraction)   . SDH (subdural hematoma) ([place]) 02/06/2013   R parafalcine SDH after a fall, rx at [facility]    Past Surgical History:  Procedure  Laterality Date  . CARDIAC CATHETERIZATION  01/14/12   LV FXN EF 50-55% W/MILD DISTAL INFERIOR HYPOKINESIS; PCI: LAD PTCA/STENT W/NEW 2.75X22 RESOLUTE DES IN MID LAD POST DIALTED TO 3.08 TO 2.97 TAPER: 99%-80% TO 0; LCX: PTCA VIA LM STENT W/2.25X12 SPRINTER BALLOON: 90%-5; LAD: PATENT PROXIMAL STENT EXTENDING INTO LM W/30-40% SMOOTH NARROWING IN DISTAL PORTION OF STENT; 99% IN STENT RESTENOSIS OF MID LAD STENT   . CARDIAC CATHETERIZATION N/A 12/02/2015   Procedure: Left Heart Cath and Coronary Angiography;  Surgeon: Troy Sine, MD;  Location: [place] [facility];   Service: Cardiovascular;  Laterality: N/A;  . CARDIAC CATHETERIZATION N/A 12/02/2015   Procedure: Coronary Stent Intervention;  Surgeon: Troy Sine, MD;  Location: [place] CV LAB;  Service: Cardiovascular;  Laterality: N/A;  . CARDIOVERSION N/A 09/26/2018   Procedure: CARDIOVERSION;  Surgeon: Troy Sine, MD;  Location[facility] ENDOSCOPY;  Service: Cardiovascular;  Laterality: N/A;  . CORONARY ARTERY BYPASS GRAFT  1997   LIMA to the LAD and vein to the RCA;  . CORONARY STENT PLACEMENT  12/02/2015   PAD  . DOPPLER ECHOCARDIOGRAPHY  01/09/12   LV NORMAL IN SIZE, NORMAL WAL THICKNESS, EF 50-55%; MILD POSTERIOR WALL HYOPKINESIS, THERE IS MILD INFERIOR WALL HYPOKINESIS  . HYPOTHENAR FAT PAD TRANSFER    . LEFT HEART CATHETERIZATION WITH CORONARY/GRAFT ANGIOGRAM N/A 01/14/2012   Procedure: LEFT HEART CATHETERIZATION WITH Beatrix Fetters;  Surgeon: Troy Sine, MD;  Location: [facility] CATH LAB;  Service: Cardiovascular;  Laterality: N/A;  . LOWER ARTERIAL DOPPLER  01/24/12   ESSENTIALLY NORMAL RIGHT GROIN DUPLEX EVALUATION S/P THROMBIN INJECTION  . LOWER VENOUS DUPLEX  02/05/12   ESSENTIALLY NORMAL RIGHT LOWER EXTRIMTY VENOUS DUPLEX DOPPLER EVALUATION  . NM MYOVIEW LTD  01/09/12   HIGH RISK SCAN. COMPARED TO PREVIOUS STUDY, THERE IS NOW ISCHEMIA PRESENT. ABNORMAL MYOCARDIAL PERFUSION STUDY. THERE IS NEW MILD INFEROLATERAL ISCHEMIA TOWARDS THE BASE; PT TO FOLLOW UP WITH DR. [name]  . PERCUTANEOUS CORONARY STENT INTERVENTION (PCI-S) N/A 01/14/2012   Procedure: PERCUTANEOUS CORONARY STENT INTERVENTION (PCI-S);  Surgeon: Troy Sine, MD;  Location: [facility] CATH LAB;  Service: Cardiovascular;  Laterality: N/A;    Current Medications: Outpatient Medications Prior to Visit  Medication Sig Dispense Refill  . amiodarone (PACERONE) 200 MG tablet Take 1.5 tablets (300 mg total) by mouth daily. 45 tablet 6  . atorvastatin (LIPITOR) 40 MG tablet Take 1 tablet (40 mg total) by mouth daily. 90 tablet 3  .  benazepril (LOTENSIN) 20 MG tablet TAKE 1 TABLET(20 MG) BY MOUTH DAILY 90 tablet 3  . cholecalciferol (VITAMIN D) 1000 UNITS tablet Take 1,000 Units by mouth daily.    . clopidogrel (PLAVIX) 75 MG tablet TAKE 1 TABLET(75 MG) BY MOUTH DAILY WITH BREAKFAST 90 tablet 3  . ELIQUIS 5 MG TABS tablet TAKE 1 TABLET(5 MG) BY MOUTH TWICE DAILY 180 tablet 1  . famotidine (PEPCID) 20 MG tablet Take 20 mg by mouth daily.    . furosemide (LASIX) 40 MG tablet Take one tablet daily. may take one extra tablet daily for 3 days for swelling (Patient taking differently: Take 40 mg by mouth See admin instructions. Take one tablet daily. may take one extra tablet daily for 3 days for swelling) 100 tablet 3  . hydrALAZINE (APRESOLINE) 25 MG tablet Take 25 mg by mouth 2 (two) times daily.    . insulin aspart (NOVOLOG) 100 UNIT/ML FlexPen Inject 12 Units into the skin 2 (two) times daily with a meal.     .  isosorbide mononitrate (IMDUR) 60 MG 24 hr tablet TAKE 1 AND 1/2 TABLETS BY MOUTH EVERY MORNING AND 1/2 TABLET EVERY EVENING 180 tablet 3  . LEVEMIR FLEXTOUCH 100 UNIT/ML Pen Inject 42 Units into the skin 2 (two) times daily.   12  . LINZESS 145 MCG CAPS capsule Take 145 mcg by mouth daily before breakfast.   11  . metFORMIN (GLUCOPHAGE-XR) 750 MG 24 hr tablet Take 750 mg by mouth daily with breakfast.    . metoprolol succinate (TOPROL-XL) 50 MG 24 hr tablet Take 25 mg by mouth daily. Take with or immediately following a meal.    . Multiple Vitamin (MULTIVITAMIN WITH MINERALS) TABS Take 1 tablet by mouth daily.    . nitroGLYCERIN (NITROSTAT) 0.4 MG SL tablet Place 1 tablet (0.4 mg total) under the tongue every 5 (five) minutes as needed. For chest pain 25 tablet 12  . NOVOFINE AUTOCOVER 30G X 8 MM MISC     . ONE TOUCH ULTRA TEST test strip   4  . oxybutynin (DITROPAN) 5 MG tablet Take 1 tablet by mouth daily.    . potassium chloride SA (K-DUR) 20 MEQ tablet Take 1 tablet daily as needed 90 tablet 1  . SYNTHROID 75 MCG  tablet Take 75 mcg by mouth daily before breakfast.     . TRULICITY 1.5 NL/8.9QJ SOPN Inject 1.5 mg as directed once a week.   12  . vitamin C (ASCORBIC ACID) 500 MG tablet Take 500 mg by mouth daily.    . metolazone (ZAROXOLYN) 2.5 MG tablet Take 1 tablet (2.5 mg total) by mouth daily for 2 days. Take 30 minutes before Lasix. 2 tablet 0   No facility-administered medications prior to visit.      Allergies:   Diltiazem, Lovastatin, Proton pump inhibitors, Tramadol, and Codeine   Social History   Socioeconomic History  . Marital status: Married    Spouse name: Not on file  . Number of children: 1  . Years of education: Not on file  . Highest education level: Not on file  Occupational History  . Not on file  Social Needs  . Financial resource strain: Not on file  . Food insecurity    Worry: Not on file    Inability: Not on file  . Transportation needs    Medical: Not on file    Non-medical: Not on file  Tobacco Use  . Smoking status: Never Smoker  . Smokeless tobacco: Never Used  Substance and Sexual Activity  . Alcohol use: No    Alcohol/week: 0.0 standard drinks  . Drug use: No  . Sexual activity: Not on file  Lifestyle  . Physical activity    Days per week: Not on file    Minutes per session: Not on file  . Stress: Not on file  Relationships  . Social Herbalist on phone: Not on file    Gets together: Not on file    Attends religious service: Not on file    Active member of club or organization: Not on file    Attends meetings of clubs or organizations: Not on file    Relationship status: Not on file  Other Topics Concern  . Not on file  Social History Narrative   COULD NOT FIND ANY INFO ABOUT FAMILY HISTORY      Family History:  The patient's family history includes Cancer in his father; Diabetes in his paternal aunt.   ROS:   Please see the  history of present illness.    ROS All other systems reviewed and are negative.   PHYSICAL EXAM:   VS:   BP 128/66   Pulse (!) 58   Temp 97.7 F (36.5 C)   Ht 5\' 7"  (1.702 m)   Wt 212 lb (96.2 kg)   BMI 33.20 kg/m    GEN: Well nourished, well developed, in no acute distress  HEENT: normal  Neck: no JVD, carotid bruits, or masses Cardiac: RRR; no murmurs, rubs, or gallops. 2+ edema  Respiratory:  clear to auscultation bilaterally, normal work of breathing GI: soft, nontender, nondistended, + BS MS: no deformity or atrophy  Skin: warm and dry, no rash Neuro:  Alert and Oriented x 3, Strength and sensation are intact Psych: euthymic mood, full affect  Wt Readings from Last 3 Encounters:  02/26/19 212 lb (96.2 kg)  02/20/19 224 lb 6.4 oz (101.8 kg)  09/26/18 179 lb (81.2 kg)      Studies/Labs Reviewed:   EKG:  EKG is not ordered today.   Recent Labs: 06/02/2018: ALT 31; Magnesium 1.9; TSH 1.890 09/22/2018: Hemoglobin 12.2; Platelets 205 02/26/2019: BUN 31; Creatinine, Ser 1.63; Potassium 3.9; Sodium 133   Lipid Panel    Component Value Date/Time   CHOL 97 (L) 06/02/2018 0811   TRIG 78 06/02/2018 0811   HDL 34 (L) 06/02/2018 0811   CHOLHDL 2.9 06/02/2018 0811   LDLCALC 47 06/02/2018 0811    Additional studies/ records that were reviewed today include:   Echo 05/28/2018 LV EF: 45% - 50% Study Conclusions  - Left ventricle: The cavity size was normal. There was moderate concentric hypertrophy. Systolic function was mildly reduced. The estimated ejection fraction was in the range of 45% to 50%. Mild diffuse hypokinesis. The study was not technically sufficient to allow evaluation of LV diastolic dysfunction due to atrial fibrillation. - Aortic valve: Trileaflet; moderately thickened, moderately calcified leaflets. There was trivial regurgitation. - Mitral valve: There was mild regurgitation. - Left atrium: The atrium was mildly dilated. - Right ventricle: Systolic function was mildly reduced. - Tricuspid valve: There was mild regurgitation. - Pulmonic  valve: There was trivial regurgitation.    ASSESSMENT:    1. Acute on chronic combined systolic and diastolic CHF (congestive heart failure) (HCC)   2. Paroxysmal atrial fibrillation ([place])   3. Coronary artery disease due to lipid rich plaque   4. Essential hypertension   5. Hyperlipidemia LDL goal <70   6. Controlled type 2 diabetes mellitus without complication, without long-term current use of insulin (HCC)      PLAN:  In order of problems listed above:  1. Acute on chronic combined systolic and diastolic heart failure: Continue 2.5 mg metolazone for 2 days.  He has been diuresed 15 pounds since the last visit.  I will bring him back in 2 weeks for reassessment.  Dry weight is likely around 190-195 pounds.   2. CAD s/p CABG: Denies any recent chest pain.  Last echocardiogram obtained on 05/28/2018 showed EF 45 to 50%  3. PAF: Continue Eliquis and metoprolol  4. Hypertension: Blood pressure stable  5. Hyperlipidemia: Continue Lipitor  6. DM2: Managed by primary care provider.    Medication Adjustments/Labs and Tests Ordered: Current medicines are reviewed at length with the patient today.  Concerns regarding medicines are outlined above.  Medication changes, Labs and Tests ordered today are listed in the Patient Instructions below. Patient Instructions  Medication Instructions:   Take Metolazone 10 mg with morning Lasix  for 2 days  If you need a refill on your cardiac medications before your next appointment, please call your pharmacy.   Lab work: NONE ordered at this time of appointment   If you have labs (blood work) drawn today and your tests are completely normal, you will receive your results only by: Marland Kitchen MyChart Message (if you have MyChart) OR . A paper copy in the mail If you have any lab test that is abnormal or we need to change your treatment, we will call you to review the results.  Testing/Procedures: NONE ordered at this time of appointment    Follow-Up: At [facility], you and your health needs are our priority.  As part of our continuing mission to provide you with exceptional heart care, we have created designated Provider Care Teams.  These Care Teams include your primary Cardiologist (physician) and Advanced Practice Providers (APPs -  Physician Assistants and Nurse Practitioners) who all work together to provide you with the care you need, when you need it. You will need a follow up appointment in 2 weeks with Almyra Deforest, PA-C   Any Other Special Instructions Will Be Listed Below (If Applicable).       Hilbert Corrigan, [place]  02/27/2019 [facility] [address], [place], [place]  [address] Phone: [phone]; Fax: [phone]

## 2019-02-26 NOTE — Patient Instructions (Signed)
Medication Instructions:   Take Metolazone 10 mg with morning Lasix for 2 days  If you need a refill on your cardiac medications before your next appointment, please call your pharmacy.   Lab work: NONE ordered at this time of appointment   If you have labs (blood work) drawn today and your tests are completely normal, you will receive your results only by: Marland Kitchen MyChart Message (if you have MyChart) OR . A paper copy in the mail If you have any lab test that is abnormal or we need to change your treatment, we will call you to review the results.  Testing/Procedures: NONE ordered at this time of appointment   Follow-Up: At [facility], you and your health needs are our priority.  As part of our continuing mission to provide you with exceptional heart care, we have created designated Provider Care Teams.  These Care Teams include your primary Cardiologist (physician) and Advanced Practice Providers (APPs -  Physician Assistants and Nurse Practitioners) who all work together to provide you with the care you need, when you need it. You will need a follow up appointment in 2 weeks with Almyra Deforest, PA-C   Any Other Special Instructions Will Be Listed Below (If Applicable).

## 2019-02-26 NOTE — Telephone Encounter (Signed)

## 2019-02-27 ENCOUNTER — Encounter: Payer: Self-pay | Admitting: Physician Assistant

## 2019-02-27 DIAGNOSIS — R600 Localized edema: Secondary | ICD-10-CM | POA: Diagnosis not present

## 2019-02-27 DIAGNOSIS — Z139 Encounter for screening, unspecified: Secondary | ICD-10-CM | POA: Diagnosis not present

## 2019-02-27 DIAGNOSIS — E1129 Type 2 diabetes mellitus with other diabetic kidney complication: Secondary | ICD-10-CM | POA: Diagnosis not present

## 2019-02-27 DIAGNOSIS — I129 Hypertensive chronic kidney disease with stage 1 through stage 4 chronic kidney disease, or unspecified chronic kidney disease: Secondary | ICD-10-CM | POA: Diagnosis not present

## 2019-02-27 DIAGNOSIS — E1165 Type 2 diabetes mellitus with hyperglycemia: Secondary | ICD-10-CM | POA: Diagnosis not present

## 2019-02-27 LAB — BASIC METABOLIC PANEL
BUN/Creatinine Ratio: 19 (ref 10–24)
BUN: 31 mg/dL — ABNORMAL HIGH (ref 8–27)
CO2: 27 mmol/L (ref 20–29)
Calcium: 8.9 mg/dL (ref 8.6–10.2)
Chloride: 91 mmol/L — ABNORMAL LOW (ref 96–106)
Creatinine, Ser: 1.63 mg/dL — ABNORMAL HIGH (ref 0.76–1.27)
GFR calc Af Amer: 47 mL/min/{1.73_m2} — ABNORMAL LOW (ref >59)
GFR calc non Af Amer: 40 mL/min/{1.73_m2} — ABNORMAL LOW (ref >59)
Glucose: 148 mg/dL — ABNORMAL HIGH (ref 65–99)
Potassium: 3.9 mmol/L (ref 3.5–5.2)
Sodium: 133 mmol/L — ABNORMAL LOW (ref 134–144)

## 2019-03-02 NOTE — Progress Notes (Signed)
The patient has been notified of the result and verbalized understanding.  All questions (if any) were answered. Jacqulynn Cadet, [place] 03/02/2019 3:16 PM

## 2019-03-04 DIAGNOSIS — E1165 Type 2 diabetes mellitus with hyperglycemia: Secondary | ICD-10-CM | POA: Diagnosis not present

## 2019-03-04 DIAGNOSIS — I129 Hypertensive chronic kidney disease with stage 1 through stage 4 chronic kidney disease, or unspecified chronic kidney disease: Secondary | ICD-10-CM | POA: Diagnosis not present

## 2019-03-04 DIAGNOSIS — N189 Chronic kidney disease, unspecified: Secondary | ICD-10-CM | POA: Diagnosis not present

## 2019-03-04 DIAGNOSIS — E1122 Type 2 diabetes mellitus with diabetic chronic kidney disease: Secondary | ICD-10-CM | POA: Diagnosis not present

## 2019-03-06 ENCOUNTER — Telehealth: Payer: Self-pay | Admitting: Cardiovascular Disease

## 2019-03-06 NOTE — Telephone Encounter (Signed)
Returned call to patient whose diuretics were being adjusted by Isaac Laud PA  He reports his waist is down 4 inches His leg swelling is "way down" He reports swelling in calves if on feet for long periods of time but better with rest He reports he is able to walk and swelling improves He reports his breathing is good  Will send to PA to advise on lasix dose/frequency

## 2019-03-06 NOTE — Telephone Encounter (Signed)
New Message   Pt c/o medication issue:  1. Name of Medication: furosemide (LASIX) 40 MG tablet  2. How are you currently taking this medication (dosage and times per day)? Patient is currently taking 2 per day (one at 7am and one a 12 noon)  3. Are you having a reaction (difficulty breathing--STAT)? No  4. What is your medication issue? Patient's weight on 02/20/19 was 219.4lbs and on today at 189.6lbs. Patient would like to know should he keep taking the medication as instructed. The goal for his weight was to be between 190-195lbs by his next visit. So he would like to know should he continue taking 2 pills a day or  Just one? Please give patient a call back.

## 2019-03-13 ENCOUNTER — Ambulatory Visit (INDEPENDENT_AMBULATORY_CARE_PROVIDER_SITE_OTHER): Payer: Medicare Other | Admitting: Physician Assistant

## 2019-03-13 ENCOUNTER — Other Ambulatory Visit: Payer: Self-pay

## 2019-03-13 VITALS — BP 160/64 | HR 64 | Temp 97.8°F | Ht 67.0 in | Wt 201.8 lb

## 2019-03-13 DIAGNOSIS — R600 Localized edema: Secondary | ICD-10-CM

## 2019-03-13 DIAGNOSIS — I2583 Coronary atherosclerosis due to lipid rich plaque: Secondary | ICD-10-CM

## 2019-03-13 DIAGNOSIS — E119 Type 2 diabetes mellitus without complications: Secondary | ICD-10-CM | POA: Diagnosis not present

## 2019-03-13 DIAGNOSIS — E785 Hyperlipidemia, unspecified: Secondary | ICD-10-CM | POA: Diagnosis not present

## 2019-03-13 DIAGNOSIS — I10 Essential (primary) hypertension: Secondary | ICD-10-CM | POA: Diagnosis not present

## 2019-03-13 DIAGNOSIS — N183 Chronic kidney disease, stage 3 unspecified: Secondary | ICD-10-CM

## 2019-03-13 DIAGNOSIS — I2510 Atherosclerotic heart disease of native coronary artery without angina pectoris: Secondary | ICD-10-CM | POA: Diagnosis not present

## 2019-03-13 NOTE — Progress Notes (Signed)
Cardiology Office Note    Date:  03/15/2019   ID:  Roberto Haley, DOB Aug 25, 1940, MRN [id number]  PCP:  Nicoletta Dress, MD  Cardiologist:  Dr. Claiborne Billings   Chief Complaint  Patient presents with   Follow-up    seen for Dr. Claiborne Billings.     History of Present Illness:  Roberto Haley is a 78 y.o. male with PMH ofHTN, HLD, DM 2, history of PVCs, subdural hematoma in July 2014 after fall, stage III CKD,PAF,CAD s/p [id number] with LIMA to LAD, SVG to RCA. He had a stenting of LAD and PTCA of diagonal vessel in March 1998 after found to have severe stenosis in the LIMA graft. Cardiac catheterization in June 2013 revealed 99% in-stent restenosis in the LAD stent and a new stenosis in the distal left circumflex, he underwent two-vessel intervention with insertion of stent in the LAD and PTCA of distal left circumflex. Cardiopulmonary stress test showed reduced maximum oxygen consumption. Beta-blocker therapy was cut back due to significant chronotropic incompetence. Myoview in April 2017 showed EF 42% with inferolateral hypokinesis, new large defect in the inferolateral wall, no change in his previous anterior septal defect. He underwent cardiac catheterization and found to have significant native CAD with patent stent from mid left main into proximal LAD, 50% stenosis in the LAD after the first diagonal, new total occlusion in the proximal native RCA with faint bridging collaterals, vein graft supplying PDA and PLA were patent however he had new disease in the mid PDA which was treated with [employer] stent.Due to history of intracranial bleed, he was transitioned to single antiplatelet therapy after 30 days rather than dual antiplatelet therapy. Echocardiogram obtained on 05/28/2018 showed EF 45 to 50%, diffuse hypokinesis, mild MR.   Patient was diagnosed with new atrial fibrillation in October 2019.  Eliquis was started and Plavix was removed.  He was started on amiodarone therapy in December  2019.  Toprol-XL was reduced due to bradycardia.  He underwent successful cardioversion in February 2020.  When I saw the patient in July 2020, he was having significant lower extremity edema.  I started him on 2.5 mg daily of metolazone for 2 days.  When he returned on 7/23, he managed to lose about 15 pounds.  Due to persistently low extremity edema, I recommended 2 more days of metolazone.  Patient presents today for cardiology office visit along with his home medications.  There are multiple medication discrepancies based on our medication list.  Instead of taking 25 mg daily of metoprolol succinate, he is actually taking 75 mg daily.  Levothyroxine dose was changed.  His Lasix is 40 mg twice daily instead of once a day.  Amiodarone has been reduced from the previous 300 mg daily down to 100 mg daily.  Based on his home weight, his weight has reduced down to 198 pounds before passing back to 200 pound.  This likely represent his dry weight.  He continued to have 2-3+ pitting edema, some of which may be more venous insufficiency than true heart failure.  I recommended leg elevation this time.  He still has about 8 tablets of metolazone at home which can be given later if he become volume overloaded.  I recommend a basic metabolic panel today.  He can follow-up with Dr. Claiborne Billings in 4 months.   Past Medical History:  Diagnosis Date   Arthritis    Chronic kidney disease    CKD STAGE 3;  CHRONIC RENAL INSUFFICIENCY   Coronary  artery disease    Diabetes mellitus    Groin hematoma 02/19/12   RIGHT   Hyperlipidemia    Hypertension    Neuropathy due to type 2 diabetes mellitus (HCC)    Obesity    PVC (premature ventricular contraction)    SDH (subdural hematoma) ([place]) 02/06/2013   R parafalcine SDH after a fall, rx [facility]    Past Surgical History:  Procedure Laterality Date   CARDIAC CATHETERIZATION  01/14/12   LV FXN EF 50-55% W/MILD DISTAL INFERIOR HYPOKINESIS; PCI: LAD PTCA/STENT  W/NEW 2.75X22 RESOLUTE DES IN MID LAD POST DIALTED TO 3.08 TO 2.97 TAPER: 99%-80% TO 0; LCX: PTCA VIA LM STENT W/2.25X12 SPRINTER BALLOON: 90%-5; LAD: PATENT PROXIMAL STENT EXTENDING INTO LM W/30-40% SMOOTH NARROWING IN DISTAL PORTION OF STENT; 99% IN STENT RESTENOSIS OF MID LAD STENT    CARDIAC CATHETERIZATION N/A 12/02/2015   Procedure: Left Heart Cath and Coronary Angiography;  Surgeon: Troy Sine, MD;  Location: [facility];  Service: Cardiovascular;  Laterality: N/A;   CARDIAC CATHETERIZATION N/A 12/02/2015   Procedure: Coronary Stent Intervention;  Surgeon: Troy Sine, MD;  Location: [facility];  Service: Cardiovascular;  Laterality: N/A;   CARDIOVERSION N/A 09/26/2018   Procedure: CARDIOVERSION;  Surgeon: Troy Sine, MD;  Location: [facility] ENDOSCOPY;  Service: Cardiovascular;  Laterality: N/A;   CORONARY ARTERY BYPASS GRAFT  1997   LIMA to the LAD and vein to the RCA;   CORONARY STENT PLACEMENT  12/02/2015   PAD   DOPPLER ECHOCARDIOGRAPHY  01/09/12   LV NORMAL IN SIZE, NORMAL WAL THICKNESS, EF 50-55%; MILD POSTERIOR WALL HYOPKINESIS, THERE IS MILD INFERIOR WALL HYPOKINESIS   HYPOTHENAR FAT PAD TRANSFER     LEFT HEART CATHETERIZATION WITH CORONARY/GRAFT ANGIOGRAM N/A 01/14/2012   Procedure: LEFT HEART CATHETERIZATION WITH Beatrix Fetters;  Surgeon: Troy Sine, MD;  Location: [facility] CATH LAB;  Service: Cardiovascular;  Laterality: N/A;   LOWER ARTERIAL DOPPLER  01/24/12   ESSENTIALLY NORMAL RIGHT GROIN DUPLEX EVALUATION S/P THROMBIN INJECTION   LOWER VENOUS DUPLEX  02/05/12   ESSENTIALLY NORMAL RIGHT LOWER EXTRIMTY VENOUS DUPLEX DOPPLER EVALUATION   NM MYOVIEW LTD  01/09/12   HIGH RISK SCAN. COMPARED TO PREVIOUS STUDY, THERE IS NOW ISCHEMIA PRESENT. ABNORMAL MYOCARDIAL PERFUSION STUDY. THERE IS NEW MILD INFEROLATERAL ISCHEMIA TOWARDS THE BASE; PT TO FOLLOW UP WITH DR. [name]   PERCUTANEOUS CORONARY STENT INTERVENTION (PCI-S) N/A 01/14/2012   Procedure:  PERCUTANEOUS CORONARY STENT INTERVENTION (PCI-S);  Surgeon: Troy Sine, MD;  Location: [facility] CATH LAB;  Service: Cardiovascular;  Laterality: N/A;    Current Medications: Outpatient Medications Prior to Visit  Medication Sig Dispense Refill   amiodarone (PACERONE) 200 MG tablet Take 100 mg by mouth daily.     atorvastatin (LIPITOR) 40 MG tablet Take 1 tablet (40 mg total) by mouth daily. 90 tablet 3   benazepril (LOTENSIN) 40 MG tablet Take 1 tablet by mouth daily.     cholecalciferol (VITAMIN D) 1000 UNITS tablet Take 1,000 Units by mouth daily.     clopidogrel (PLAVIX) 75 MG tablet TAKE 1 TABLET(75 MG) BY MOUTH DAILY WITH BREAKFAST 90 tablet 3   ELIQUIS 5 MG TABS tablet TAKE 1 TABLET(5 MG) BY MOUTH TWICE DAILY 180 tablet 1   furosemide (LASIX) 40 MG tablet Take 40 mg by mouth 2 (two) times daily.     hydrALAZINE (APRESOLINE) 25 MG tablet Take 25 mg by mouth 2 (two) times daily.     insulin  aspart (NOVOLOG) 100 UNIT/ML FlexPen Inject 12 Units into the skin 2 (two) times daily with a meal.      isosorbide mononitrate (IMDUR) 60 MG 24 hr tablet TAKE 1 AND 1/2 TABLETS BY MOUTH EVERY MORNING AND 1/2 TABLET EVERY EVENING 180 tablet 3   LEVEMIR FLEXTOUCH 100 UNIT/ML Pen Inject 42 Units into the skin 2 (two) times daily.   12   levothyroxine (SYNTHROID) 100 MCG tablet Take 1 tablet by mouth 2 (two) times daily.     LINZESS 145 MCG CAPS capsule Take 145 mcg by mouth daily before breakfast.   11   metFORMIN (GLUCOPHAGE-XR) 750 MG 24 hr tablet Take 750 mg by mouth daily with breakfast.     metoprolol succinate (TOPROL-XL) 50 MG 24 hr tablet Take 75 mg by mouth daily. Take with or immediately following a meal.      Multiple Vitamin (MULTIVITAMIN WITH MINERALS) TABS Take 1 tablet by mouth daily.     nitroGLYCERIN (NITROSTAT) 0.4 MG SL tablet Place 1 tablet (0.4 mg total) under the tongue every 5 (five) minutes as needed. For chest pain 25 tablet 12   NOVOFINE AUTOCOVER 30G X 8 MM MISC       ONE TOUCH ULTRA TEST test strip   4   oxybutynin (DITROPAN) 5 MG tablet Take 1 tablet by mouth daily.     potassium chloride SA (K-DUR) 20 MEQ tablet Take 20 mEq by mouth daily.     TRULICITY 1.5 0000000 SOPN Inject 1.5 mg as directed once a week.   12   vitamin C (ASCORBIC ACID) 500 MG tablet Take 500 mg by mouth daily.     metolazone (ZAROXOLYN) 10 MG tablet Take 1 tablet (10 mg total) by mouth daily for 10 days. Take 1 tablet with morning Lasix for 2 days. Hold remaining 8 tablet for volume overload. 10 tablet 0   amiodarone (PACERONE) 200 MG tablet Take 1.5 tablets (300 mg total) by mouth daily. (Patient not taking: Reported on 03/13/2019) 45 tablet 6   benazepril (LOTENSIN) 20 MG tablet TAKE 1 TABLET(20 MG) BY MOUTH DAILY 90 tablet 3   famotidine (PEPCID) 20 MG tablet Take 20 mg by mouth daily.     furosemide (LASIX) 40 MG tablet Take one tablet daily. may take one extra tablet daily for 3 days for swelling (Patient not taking: Reported on 03/13/2019) 100 tablet 3   potassium chloride SA (K-DUR) 20 MEQ tablet Take 1 tablet daily as needed (Patient not taking: Reported on 03/13/2019) 90 tablet 1   SYNTHROID 75 MCG tablet Take 75 mcg by mouth daily before breakfast.      No facility-administered medications prior to visit.      Allergies:   Diltiazem, Lovastatin, Proton pump inhibitors, Tramadol, and Codeine   Social History   Socioeconomic History   Marital status: Married    Spouse name: Not on file   Number of children: 1   Years of education: Not on file   Highest education level: Not on file  Occupational History   Not on file  Social Needs   Financial resource strain: Not on file   Food insecurity    Worry: Not on file    Inability: Not on file   Transportation needs    Medical: Not on file    Non-medical: Not on file  Tobacco Use   Smoking status: Never Smoker   Smokeless tobacco: Never Used  Substance and Sexual Activity   Alcohol use: No  Alcohol/week: 0.0 standard drinks   Drug use: No   Sexual activity: Not on file  Lifestyle   Physical activity    Days per week: Not on file    Minutes per session: Not on file   Stress: Not on file  Relationships   Social connections    Talks on phone: Not on file    Gets together: Not on file    Attends religious service: Not on file    Active member of club or organization: Not on file    Attends meetings of clubs or organizations: Not on file    Relationship status: Not on file  Other Topics Concern   Not on file  Social History Narrative   COULD NOT FIND ANY INFO ABOUT FAMILY HISTORY      Family History:  The patient's family history includes Cancer in his father; Diabetes in his paternal aunt.   ROS:   Please see the history of present illness.    ROS All other systems reviewed and are negative.   PHYSICAL EXAM:   VS:  BP (!) 160/64    Pulse 64    Temp 97.8 F (36.6 C) (Temporal)    Ht 5\' 7"  (1.702 m)    Wt 201 lb 12.8 oz (91.5 kg)    SpO2 99%    BMI 31.61 kg/m    GEN: Well nourished, well developed, in no acute distress  HEENT: normal  Neck: no JVD, carotid bruits, or masses Cardiac: RRR; no murmurs, rubs, or gallops. 2-3+ edema  Respiratory:  clear to auscultation bilaterally, normal work of breathing GI: soft, nontender, nondistended, + BS MS: no deformity or atrophy  Skin: warm and dry, no rash Neuro:  Alert and Oriented x 3, Strength and sensation are intact Psych: euthymic mood, full affect  Wt Readings from Last 3 Encounters:  03/13/19 201 lb 12.8 oz (91.5 kg)  02/26/19 212 lb (96.2 kg)  02/20/19 224 lb 6.4 oz (101.8 kg)      Studies/Labs Reviewed:   EKG:  EKG is not ordered today.    Recent Labs: 06/02/2018: ALT 31; Magnesium 1.9; TSH 1.890 09/22/2018: Hemoglobin 12.2; Platelets 205 03/13/2019: BUN 27; Creatinine, Ser 1.87; Potassium 4.7; Sodium 135   Lipid Panel    Component Value Date/Time   CHOL 97 (L) 06/02/2018 0811   TRIG 78  06/02/2018 0811   HDL 34 (L) 06/02/2018 0811   CHOLHDL 2.9 06/02/2018 0811   LDLCALC 47 06/02/2018 0811    Additional studies/ records that were reviewed today include:   Echo 05/28/2018 LV EF: 45% -   50% Study Conclusions  - Left ventricle: The cavity size was normal. There was moderate   concentric hypertrophy. Systolic function was mildly reduced. The   estimated ejection fraction was in the range of 45% to 50%. Mild   diffuse hypokinesis. The study was not technically sufficient to   allow evaluation of LV diastolic dysfunction due to atrial   fibrillation. - Aortic valve: Trileaflet; moderately thickened, moderately   calcified leaflets. There was trivial regurgitation. - Mitral valve: There was mild regurgitation. - Left atrium: The atrium was mildly dilated. - Right ventricle: Systolic function was mildly reduced. - Tricuspid valve: There was mild regurgitation. - Pulmonic valve: There was trivial regurgitation.   ASSESSMENT:    1. Lower extremity edema   2. Essential hypertension   3. Hyperlipidemia LDL goal <70   4. Controlled type 2 diabetes mellitus without complication, without long-term current use of insulin (  [place])   5. Coronary artery disease due to lipid rich plaque   6. CKD (chronic kidney disease), stage III ([place])      PLAN:  In order of problems listed above:  1. Lower extremity edema: Continue on Lasix 40 mg twice daily.  Use metolazone as needed.  Continue leg elevation.  2. CAD s/p CABG: Denies any recent chest pain  3. PAF: Continue Eliquis and metoprolol  4. Hypertension: Blood pressure elevated today.  However normally his blood pressure is well controlled  5. Hyperlipidemia: Continue Lipitor  6. DM2: Managed by primary care provider  7. CKD stage III: Obtain basic metabolic panel    Medication Adjustments/Labs and Tests Ordered: Current medicines are reviewed at length with the patient today.  Concerns regarding medicines are  outlined above.  Medication changes, Labs and Tests ordered today are listed in the Patient Instructions below. Patient Instructions  Medication Instructions:  Almyra Deforest, PA recommends that you continue on your current medications as directed. Please refer to the Current Medication list given to you today.  If you need a refill on your cardiac medications before your next appointment, please call your pharmacy.   Lab work: Your physician recommends that you return for lab work TODAY.  If you have labs (blood work) drawn today and your tests are completely normal, you will receive your results only by:  [place] (if you have MyChart) OR  A paper copy in the mail If you have any lab test that is abnormal or we need to change your treatment, we will call you to review the results.  Follow-Up: At [facility], you and your health needs are our priority.  As part of our continuing mission to provide you with exceptional heart care, we have created designated Provider Care Teams.  These Care Teams include your primary Cardiologist (physician) and Advanced Practice Providers (APPs -  Physician Assistants and Nurse Practitioners) who all work together to provide you with the care you need, when you need it. You will need a follow up appointment in 4 months.  Please call our office 2 months in advance to schedule this appointment.  You may see Shelva Majestic, MD or one of the following Advanced Practice Providers on your designated Care Team: Almyra Deforest, PA-C  Fabian Sharp, PA-C  You will receive a reminder letter in the mail two months in advance. If you don't receive a letter, please call our office to schedule the follow-up appointment.    Hilbert Corrigan, [place]  03/15/2019 2:05 PM    [facility] [address], [place], [place]  [address] Phone: [phone]; Fax: [phone]

## 2019-03-13 NOTE — Patient Instructions (Addendum)
Medication Instructions:  Roberto Deforest, PA recommends that you continue on your current medications as directed. Please refer to the Current Medication list given to you today.  If you need a refill on your cardiac medications before your next appointment, please call your pharmacy.   Lab work: Your physician recommends that you return for lab work TODAY.  If you have labs (blood work) drawn today and your tests are completely normal, you will receive your results only by: [facility] MyChart Message (if you have MyChart) OR . A paper copy in the mail If you have any lab test that is abnormal or we need to change your treatment, we will call you to review the results.  Follow-Up: At [facility], you and your health needs are our priority.  As part of our continuing mission to provide you with exceptional heart care, we have created designated Provider Care Teams.  These Care Teams include your primary Cardiologist (physician) and Advanced Practice Providers (APPs -  Physician Assistants and Nurse Practitioners) who all work together to provide you with the care you need, when you need it. You will need a follow up appointment in 4 months.  Please call our office 2 months in advance to schedule this appointment.  You may see Roberto Majestic, MD or one of the following Advanced Practice Providers on your designated Care Team: Roberto Haley, Roberto Haley . Roberto Sharp, PA-C . You will receive a reminder letter in the mail two months in advance. If you don't receive a letter, please call our office to schedule the follow-up appointment.

## 2019-03-14 LAB — BASIC METABOLIC PANEL
BUN/Creatinine Ratio: 14 (ref 10–24)
BUN: 27 mg/dL (ref 8–27)
CO2: 26 mmol/L (ref 20–29)
Calcium: 9.2 mg/dL (ref 8.6–10.2)
Chloride: 96 mmol/L (ref 96–106)
Creatinine, Ser: 1.87 mg/dL — ABNORMAL HIGH (ref 0.76–1.27)
GFR calc Af Amer: 39 mL/min/{1.73_m2} — ABNORMAL LOW (ref >59)
GFR calc non Af Amer: 34 mL/min/{1.73_m2} — ABNORMAL LOW (ref >59)
Glucose: 174 mg/dL — ABNORMAL HIGH (ref 65–99)
Potassium: 4.7 mmol/L (ref 3.5–5.2)
Sodium: 135 mmol/L (ref 134–144)

## 2019-03-15 ENCOUNTER — Encounter: Payer: Self-pay | Admitting: Physician Assistant

## 2019-03-19 ENCOUNTER — Other Ambulatory Visit: Payer: Self-pay

## 2019-03-19 DIAGNOSIS — R600 Localized edema: Secondary | ICD-10-CM

## 2019-03-19 NOTE — Progress Notes (Signed)
Order given to have repeat in 1 month. Order placed per Almyra Deforest, PA-C

## 2019-04-24 DIAGNOSIS — M47816 Spondylosis without myelopathy or radiculopathy, lumbar region: Secondary | ICD-10-CM | POA: Diagnosis not present

## 2019-04-24 DIAGNOSIS — M545 Low back pain: Secondary | ICD-10-CM | POA: Diagnosis not present

## 2019-04-24 DIAGNOSIS — S41112A Laceration without foreign body of left upper arm, initial encounter: Secondary | ICD-10-CM | POA: Diagnosis not present

## 2019-04-24 DIAGNOSIS — I700 Atherosclerosis of aorta: Secondary | ICD-10-CM | POA: Diagnosis not present

## 2019-04-29 DIAGNOSIS — Z794 Long term (current) use of insulin: Secondary | ICD-10-CM | POA: Diagnosis not present

## 2019-04-29 DIAGNOSIS — H25811 Combined forms of age-related cataract, right eye: Secondary | ICD-10-CM | POA: Diagnosis not present

## 2019-04-29 DIAGNOSIS — E113313 Type 2 diabetes mellitus with moderate nonproliferative diabetic retinopathy with macular edema, bilateral: Secondary | ICD-10-CM | POA: Diagnosis not present

## 2019-04-29 DIAGNOSIS — Z961 Presence of intraocular lens: Secondary | ICD-10-CM | POA: Diagnosis not present

## 2019-05-01 DIAGNOSIS — E039 Hypothyroidism, unspecified: Secondary | ICD-10-CM | POA: Diagnosis not present

## 2019-05-01 DIAGNOSIS — Z23 Encounter for immunization: Secondary | ICD-10-CM | POA: Diagnosis not present

## 2019-05-01 DIAGNOSIS — I129 Hypertensive chronic kidney disease with stage 1 through stage 4 chronic kidney disease, or unspecified chronic kidney disease: Secondary | ICD-10-CM | POA: Diagnosis not present

## 2019-05-01 DIAGNOSIS — E785 Hyperlipidemia, unspecified: Secondary | ICD-10-CM | POA: Diagnosis not present

## 2019-05-01 DIAGNOSIS — E1129 Type 2 diabetes mellitus with other diabetic kidney complication: Secondary | ICD-10-CM | POA: Diagnosis not present

## 2019-05-04 DIAGNOSIS — R600 Localized edema: Secondary | ICD-10-CM | POA: Diagnosis not present

## 2019-05-14 ENCOUNTER — Other Ambulatory Visit: Payer: Self-pay | Admitting: Cardiovascular Disease

## 2019-05-14 ENCOUNTER — Other Ambulatory Visit: Payer: Self-pay

## 2019-05-14 ENCOUNTER — Ambulatory Visit (INDEPENDENT_AMBULATORY_CARE_PROVIDER_SITE_OTHER): Payer: Medicare Other | Admitting: Cardiovascular Disease

## 2019-05-14 VITALS — BP 154/67 | HR 48 | Ht 67.0 in | Wt 195.0 lb

## 2019-05-14 DIAGNOSIS — Z7901 Long term (current) use of anticoagulants: Secondary | ICD-10-CM

## 2019-05-14 DIAGNOSIS — R001 Bradycardia, unspecified: Secondary | ICD-10-CM | POA: Diagnosis not present

## 2019-05-14 DIAGNOSIS — I2510 Atherosclerotic heart disease of native coronary artery without angina pectoris: Secondary | ICD-10-CM | POA: Diagnosis not present

## 2019-05-14 DIAGNOSIS — I2583 Coronary atherosclerosis due to lipid rich plaque: Secondary | ICD-10-CM

## 2019-05-14 DIAGNOSIS — I480 Paroxysmal atrial fibrillation: Secondary | ICD-10-CM

## 2019-05-14 DIAGNOSIS — L03116 Cellulitis of left lower limb: Secondary | ICD-10-CM

## 2019-05-14 DIAGNOSIS — Z951 Presence of aortocoronary bypass graft: Secondary | ICD-10-CM

## 2019-05-14 DIAGNOSIS — I10 Essential (primary) hypertension: Secondary | ICD-10-CM | POA: Diagnosis not present

## 2019-05-14 DIAGNOSIS — R600 Localized edema: Secondary | ICD-10-CM

## 2019-05-14 MED ORDER — CEPHALEXIN 500 MG PO CAPS: 500.0000 mg | ORAL_CAPSULE | Freq: Four times a day (QID) | ORAL | 0 refills | Status: DC

## 2019-05-14 MED ORDER — HYDRALAZINE HCL 50 MG PO TABS: 50.0000 mg | ORAL_TABLET | Freq: Two times a day (BID) | ORAL | 6 refills | Status: DC

## 2019-05-14 NOTE — Progress Notes (Signed)
Patient ID: Roberto Haley, male   DOB: Jul 07, 1941, 78 y.o.   MRN: [id number]     Primary M.D.: Dr. Nelda Bucks  HPI: Roberto Haley is a 78 y.o. male who presents to the office for a 9 month cardiology followup evaluation.   Mr Credit has known CAD and underwent CABG revascularization surgery in 1997 with LIMA to LAD, vein to the RCA. In March 1998 he underwent complex intervention to his native LAD and high speed rotational atherectomy (HSRA) and stenting of his LAD and PTCA of his diagonal vessel after he was found to have stenosis in the LIMA graft. In June 2013 due to 99% in-stent restenosis in the LAD stent and a new stenosis of 90% the distal circumflex, he underwent two-vessel intervention with insertion of a 2.75x22 mm resident stent in the LAD and PTCA of the distal circumflex via his previously placed stent which started in the mid left main coronary artery. A cardiopulmonary met test showed reduced maximum oxygen consumption. I  reduced his beta blocker therapy due to significant chronotropic incompetence and further titrated his Ranexa to 1000 mg twice a day.  A nuclear perfusion study in June 2014 showed distal anteroseptal scar without significant ischemia. He did have PACs and PVCs in gating was not done.  He fell on the Fourth of July 2014 and landed on his head. He ultimately was transferred to [facility] where was hospitalized for a week. He did not have neurosurgery. He was told of having "blood on his brain."  He did have a left-sided per hemiparesis. He then spent 2 months at [facility] rehabilitation unit and has spent additional time a universal rehabilitation in [place] until October 31.  He is significant improved following his head trauma with return of his memory.  Last year, I reduced his beta blocker therapy due to bradycardia.  He underwent a nuclear perfusion study on 11/22/2015 which now showed an ejection fraction of 42% with inferolateral hypokinesis.   There was a new large defect in the inferolateral wall and there was no change in his previous anteroseptal defect.  There was concern for scar/ischemia inferolaterally which was new.   He underwent cardiac catheterization by me on 12/02/2015 and was found to have significant native CAD with a patent stent extending from the mid left main into the proximal LAD, 50% narrowings in the LAD after the first diagonal vessel and widely patent mid LAD stent.  There was pain PTCA sites in the left circumflex proximally at the mid segment with 50% stenosis a small OM branch.  There was new total occlusion of his proximal native RCA with faint bridging collaterals and faint collateralization to the distal branch of the RCA which had not been supplied by the vein graft.  He had previously documented occluded LIMA graft.  The vein graft supplying the PDA and PLA vessel was patent but he had new distal disease with 95% stenosis in the mid PDA and very distal 75% stenosis.  He underwent successful intervention to his PDA and due to his prior history of head trauma and CNS bleed.  He underwent insertion of a research BioFreedom stent and after  30 days was transitioned to only single anti platelet therapy rather than DAPT.  Since undergoing his PCI to his mid PDA he has noted marked improvement in his ability to be active.  He continues to participate in cardiac rehabilitation and often walks 1 mile in the morning before breakfast and feels well.  He has noticed occasional angina episodes of angina, particularly when he is tired.    In June 2017 he had a brief episode of PAF, alt doing cardiac rehabilitation.  He states that he has not been sleeping very well.  He has not had any GI issues, but he has only had one colonoscopy in his life and he is 78 years old.  He is seen by Dr. Lyda Jester in [place] and was scheduled for screening colonoscopy and EGD.  When I last saw him, he had noticed some vague exertional chest  discomfort which did not occur during walks in the early morning, but he had noticed it more at the end of the day.  I further titrated his isosorbide and increase this to 90 mg in the morning and 30 mg at night.  He has continued Toprol-XL for beta blocker therapy.  I referred him for a nuclear perfusion study which was done on 10/18/2016.  This showed a calculated EF of 48% but visually it appeared to be 55%.  There was mild possible scar as well as a component of diaphragmatic attenuation but a small area of ischemia could not be completely excluded apically.   When I saw him in October 2018 he was doing well from a cardiac standpoint and was walking 1-1/2-2 miles per day in the morning and 3 days per week also going to cardiac rehabilitation.  He denied chest pain, PND, orthopnea.  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 Laboratory by Dr. Eden Emms.: Glucose was 163.  Creatinine had increased to 1.61.  Lipids were excellent with a total cholesterol 107, triglycerides 71, HDL 41, and LDL 52.  Hemoglobin A1c was 8.0.   He has been caring for his wife who has developed Alzheimer's disease.  As result he is her primary caregiver.  He is not walking as often.  Over the past month he has noticed some increased leg swelling and saw Dr. Delena Bali last week.  At that time, his amlodipine was reduced from 10 mg down to 5 mg and he was given a prescription for furosemide.  I saw him on May 19, 2018 for one-year evaluation.  At that time, he had noticed some irregularity to his heart rhythm.  He was having lower extremity edema.  His ECG showed atrial fibrillation which was new and of questionable duration.  I discontinued amlodipine, increase furosemide to 40 mg twice a day for 3 days and added hydralazine 25 mg twice a day for his blood pressure.  I initiated Eliquis 5 mg twice a day for anticoagulation and recommended he discontinue Plavix.  Underwent an echo Doppler study on May 28, 2017 which showed an  EF of 45 to 50%.  Mild MR and mild TR.  RV systolic function was mildly reduced.  Laboratory on June 02, 2018 revealed a magnesium of 1.9.  Creatinine had increased to 1.57.    I saw him in follow-up 2 weeks later on June 05, 2018.  He was still in atrial fibrillation at 67 bpm.  He underwent an echo Doppler study on May 28, 2018 which showed an EF of 45 to 50%.  There was moderate aortic sclerosis with trivial AR.  There was mild MR.  Of note, his left atrium was only mildly dilated.  There was mild TR.  His ECG confirmed persistent atrial fibrillation.  His blood pressure was improved with the addition of hydralazine.  During that office visit I titrated Toprol to 75 mg daily.  Presently, he feels well.  He walked on a treadmill today and walked 2 miles without symptoms.  He denies any tachypalpitations, presyncope or syncope.    When I evaluated him on July 08, 2018 he continued to be in atrial fibrillation at 73 bpm.  With his left atrium only being mildly dilated, I suggested a trial of amiodarone to see if this could potentially pharmacologically cardiovert him or if unsuccessful by itself with future plans for cardioversion.  As result I started him on amiodarone 200 mg daily for 2 weeks and after 2 weeks he was advised to decrease Toprol to 50 mg and further titrate amiodarone to 200 mg twice a day.  He believes his pulse is slower.  He denies chest pain, PND, orthopnea or dizziness.    I last saw him in January 2020 at which time he remained in atrial fibrillation.  He subsequently underwent outpatient DC cardioversion by me in February 2020 with restoration of sinus rhythm.  Presently, he denies any chest pain or awareness of recurrent atrial fibrillation.  However he has been having difficulty with swelling of his legs and redness and is developed a superficial wound in his left lower extremity pretibial region.  He was given a salve to take by Dr. Elissa Hefty but this has not resulted in  resolution and he does notice some more erythema in the region.  He denies any anginal symptoms.  He he is diabetic on insulin and metformin.  He continues to be on amiodarone 100 mg daily and anticoagulation with Eliquis.  He admits to being under increased stress caring for his wife with Alzheimer's disease.  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  He presents for evaluation.  Past Medical History:  Diagnosis Date   Arthritis    Chronic kidney disease    CKD STAGE 3;  CHRONIC RENAL INSUFFICIENCY   Coronary artery disease    Diabetes mellitus    Groin hematoma 02/19/12   RIGHT   Hyperlipidemia    Hypertension    Neuropathy due to type 2 diabetes mellitus (HCC)    Obesity    PVC (premature ventricular contraction)    SDH (subdural hematoma) ([place]) 02/06/2013   R parafalcine SDH after a fall, rx at [facility]    Past Surgical History:  Procedure Laterality Date   CARDIAC CATHETERIZATION  01/14/12   LV FXN EF 50-55% W/MILD DISTAL INFERIOR HYPOKINESIS; PCI: LAD PTCA/STENT W/NEW 2.75X22 RESOLUTE DES IN MID LAD POST DIALTED TO 3.08 TO 2.97 TAPER: 99%-80% TO 0; LCX: PTCA VIA LM STENT W/2.25X12 SPRINTER BALLOON: 90%-5; LAD: PATENT PROXIMAL STENT EXTENDING INTO LM W/30-40% SMOOTH NARROWING IN DISTAL PORTION OF STENT; 99% IN STENT RESTENOSIS OF MID LAD STENT    CARDIAC CATHETERIZATION N/A 12/02/2015   Procedure: Left Heart Cath and Coronary Angiography;  Surgeon: Troy Sine, MD;  Location: [facility];  Service: Cardiovascular;  Laterality: N/A;   CARDIAC CATHETERIZATION N/A 12/02/2015   Procedure: Coronary Stent Intervention;  Surgeon: Troy Sine, MD;  Location: [facility];  Service: Cardiovascular;  Laterality: N/A;   CARDIOVERSION N/A 09/26/2018   Procedure: CARDIOVERSION;  Surgeon: Troy Sine, MD;  Location: [facility] END[facility];  Service: Cardiovascular;  Laterality: N/A;   CORONARY ARTERY BYPASS GRAFT  1997   LIMA to the LAD and vein to the RCA;    CORONARY STENT PLACEMENT  12/02/2015   PAD   DOPPLER ECHOCARDIOGRAPHY  01/09/12   LV NORMAL IN SIZE, NORMAL WAL  THICKNESS, EF 50-55%; MILD POSTERIOR WALL HYOPKINESIS, THERE IS MILD INFERIOR WALL HYPOKINESIS   HYPOTHENAR FAT PAD TRANSFER     LEFT HEART CATHETERIZATION WITH CORONARY/GRAFT ANGIOGRAM N/A 01/14/2012   Procedure: LEFT HEART CATHETERIZATION WITH Beatrix Fetters;  Surgeon: Troy Sine, MD;  Location: [facility] CATH LAB;  Service: Cardiovascular;  Laterality: N/A;   LOWER ARTERIAL DOPPLER  01/24/12   ESSENTIALLY NORMAL RIGHT GROIN DUPLEX EVALUATION S/P THROMBIN INJECTION   LOWER VENOUS DUPLEX  02/05/12   ESSENTIALLY NORMAL RIGHT LOWER EXTRIMTY VENOUS DUPLEX DOPPLER EVALUATION   NM MYOVIEW LTD  01/09/12   HIGH RISK SCAN. COMPARED TO PREVIOUS STUDY, THERE IS NOW ISCHEMIA PRESENT. ABNORMAL MYOCARDIAL PERFUSION STUDY. THERE IS NEW MILD INFEROLATERAL ISCHEMIA TOWARDS THE BASE; PT TO FOLLOW UP WITH DR. [name]   PERCUTANEOUS CORONARY STENT INTERVENTION (PCI-S) N/A 01/14/2012   Procedure: PERCUTANEOUS CORONARY STENT INTERVENTION (PCI-S);  Surgeon: Troy Sine, MD;  Location: [facility] CATH LAB;  Service: Cardiovascular;  Laterality: N/A;    Allergies  Allergen Reactions   Diltiazem Other (See Comments)    Unknown   Lovastatin Other (See Comments)    Unknown   Proton Pump Inhibitors Other (See Comments)    Unknown    Tramadol Nausea And Vomiting   Codeine Rash    Current Outpatient Medications  Medication Sig Dispense Refill   acetaminophen (TYLENOL) 500 MG tablet Take 500 mg by mouth every 6 (six) hours as needed.     amiodarone (PACERONE) 200 MG tablet Take 100 mg by mouth daily.     amLODipine (NORVASC) 5 MG tablet TK 1 T PO QD     atorvastatin (LIPITOR) 40 MG tablet Take 1 tablet (40 mg total) by mouth daily. 90 tablet 3   benazepril (LOTENSIN) 40 MG tablet Take 1 tablet by mouth daily.     cholecalciferol (VITAMIN D) 1000 UNITS tablet Take 1,000 Units by mouth daily.       clopidogrel (PLAVIX) 75 MG tablet TAKE 1 TABLET(75 MG) BY MOUTH DAILY WITH BREAKFAST 90 tablet 3   ELIQUIS 5 MG TABS tablet TAKE 1 TABLET(5 MG) BY MOUTH TWICE DAILY 180 tablet 1   furosemide (LASIX) 40 MG tablet Take 40 mg by mouth 2 (two) times daily.     hydrALAZINE (APRESOLINE) 50 MG tablet Take 1 tablet (50 mg total) by mouth 2 (two) times daily. 60 tablet 6   insulin aspart (NOVOLOG) 100 UNIT/ML FlexPen Inject 14 Units into the skin 2 (two) times daily with a meal.      isosorbide mononitrate (IMDUR) 60 MG 24 hr tablet TAKE 1 AND 1/2 TABLETS BY MOUTH EVERY MORNING AND 1/2 TABLET EVERY EVENING 180 tablet 3   LEVEMIR FLEXTOUCH 100 UNIT/ML Pen Inject 44 Units into the skin 2 (two) times daily.   12   levothyroxine (SYNTHROID) 100 MCG tablet Take 1 tablet by mouth 2 (two) times daily.     LINZESS 145 MCG CAPS capsule Take 145 mcg by mouth daily before breakfast.   11   metFORMIN (GLUCOPHAGE-XR) 750 MG 24 hr tablet Take 750 mg by mouth daily with breakfast.     metoprolol succinate (TOPROL-XL) 50 MG 24 hr tablet Take 50 mg by mouth daily. Take with or immediately following a meal.      Multiple Vitamin (MULTIVITAMIN WITH MINERALS) TABS Take 1 tablet by mouth daily.     NOVOFINE AUTOCOVER 30G X 8 MM MISC      ONE TOUCH ULTRA TEST test strip   4  oxybutynin (DITROPAN) 5 MG tablet Take 1 tablet by mouth daily.     potassium chloride SA (K-DUR) 20 MEQ tablet Take 20 mEq by mouth daily.     TRULICITY 1.5 IO/9.7DZ SOPN Inject 1.5 mg as directed once a week.   12   vitamin C (ASCORBIC ACID) 500 MG tablet Take 500 mg by mouth daily.     cephALEXin (KEFLEX) 500 MG capsule Take 1 capsule (500 mg total) by mouth 4 (four) times daily. 40 capsule 0   metolazone (ZAROXOLYN) 10 MG tablet Take 1 tablet (10 mg total) by mouth daily for 10 days. Take 1 tablet with morning Lasix for 2 days. Hold remaining 8 tablet for volume overload. 10 tablet 0   mupirocin ointment (BACTROBAN) 2 % APP EXT  AA BID PRN     nitroGLYCERIN (NITROSTAT) 0.4 MG SL tablet Place 1 tablet (0.4 mg total) under the tongue every 5 (five) minutes as needed. For chest pain 25 tablet 12   No current facility-administered medications for this visit.     Socially, he is married has one child 2 grandchildren and 2 great-grandchildren. There is no tobacco or alcohol use.  ROS General: Negative; No fevers, chills, or night sweats;  HEENT: Positive for cataracts; No changes in hearing, sinus congestion, difficulty swallowing Pulmonary: Negative; No cough, wheezing, shortness of breath, hemoptysis Cardiovascular: See history of present illness;  GI: Positive for GERD on ranitidine; No nausea, vomiting, diarrhea, or abdominal pain GU: Negative; No dysuria, hematuria, or difficulty voiding Musculoskeletal: Negative; no myalgias, joint pain, or weakness Hematologic/Oncology: Negative; no easy bruising, bleeding Endocrine: Positive for hypothyroidism, on Synthroid replacement; diabetes not well controlled Neuro: Negative; no changes in balance, headaches Skin: Positive for blister of the pretibial area of his left lower extremity with redness Psychiatric: Negative; No behavioral problems, depression Sleep: Negative; No snoring, daytime sleepiness, hypersomnolence, bruxism, restless legs, hypnogognic hallucinations, no cataplexy Other comprehensive 14 point system review is negative.  PE BP (!) 154/67    Pulse (!) 48    Ht 5' 7" (1.702 m)    Wt 195 lb (88.5 kg)    SpO2 99%    BMI 30.54 kg/m    Repeat blood pressure by me was elevated at 162/72  Wt Readings from Last 3 Encounters:  05/14/19 195 lb (88.5 kg)  03/13/19 201 lb 12.8 oz (91.5 kg)  02/26/19 212 lb (96.2 kg)   General: Alert, oriented, no distress.  Skin: Bilateral erythema in his left lower extremity with an area that had previously blistered and is now an open wound for which she had been applying salve without significant benefit HEENT:  Normocephalic, atraumatic. Pupils equal round and reactive to light; sclera anicteric; extraocular muscles intact;  Nose without nasal septal hypertrophy Mouth/Parynx benign; Mallinpatti scale 3 Neck: No JVD, no carotid bruits; normal carotid upstroke Lungs: clear to ausculatation and percussion; no wheezing or rales Chest wall: without tenderness to palpitation Heart: PMI not displaced, RRR, s1 s2 normal, 1/6 systolic murmur, no diastolic murmur, no rubs, gallops, thrills, or heaves Abdomen: soft, nontender; no hepatosplenomehaly, BS+; abdominal aorta nontender and not dilated by palpation. Back: no CVA tenderness Pulses 2+ Musculoskeletal: full range of motion, normal strength, no joint deformities Extremities: Bilateral pretibial edema.  No clubbing cyanosis or edema, Homan's sign negative  Neurologic: grossly nonfocal; Cranial nerves grossly wnl Psychologic: Normal mood and affect   ECG (independently read by me): Sinus Bradycardia at 48; !st degree block; PR 250 msec  January 2020 ECG (  independently read by me): Atrial fibrillation with ventricular rate at 55 bpm.  Small Q wave in lead III.  July 08, 2018 ECG (independently read by me): Atrial  fibrillation at 73 bpm.  Inferior Q waves lead III and aVF.  QTc interval 449 ms.  June 05, 2018 ECG (independently read by me): Atrial fibrillation at 67 bpm.  QTc interval 456 ms.  No significant ST changes.  Q waves in lead III  May 19, 2018 ECG (independently read by me): Atrial Fibrillation at 69 ; NS t changes   October 2018 ECG (independently read by me): Sinus bradycardia 57 bpm.  Q wave in lead 3.  Nondiagnostic and aVF.  April 2018 ECG (independently read by me): Normal sinus rhythm at 61 bpm.  First-degree AV block with a PR interval 206 ms.  Q wave in 3 and aVF.  June 2017 ECG (independently read by me): Sinus bradycardia 58 bpm.  Inferior Q waves.  Normal intervals.  No ST segment changes.  May 2017 ECG  (independently read by me): Sinus bradycardia 59 bpm.  LVH by voltage criteria.  September 2016 ECG (independently read by me):  Normal sinus rhythm at 65 bpm. Mild voltage for LVH.  01/13/2015 ECG (independently read by me): Sinus bradycardia 58 bpm.  Normal intervals.  October 2015 ECG (independent read by me): Sinus bradycardia at 50 bpm.  No ectopy.  PR interval 196 msec,   Borderline LVH.  June 2015 ECG (independently read by me): Sinus bradycardia 52 beats per minute.  Borderline first degree AV block with a PR interval of 200 ms.  December 2014 ECG (independently read by me): Sinus rhythm at 54 beats per minute. No ectopy. Normal intervals.  ECG from 07/08/2013 : reveals sinus rhythm at 57 beats per minute. QTc interval 453 ms.  LABS: BMP Latest Ref Rng & Units 03/13/2019 02/26/2019 02/20/2019  Glucose 65 - 99 mg/dL 174(H) 148(H) 150(H)  BUN 8 - 27 mg/dL 27 31(H) 27  Creatinine 0.76 - 1.27 mg/dL 1.87(H) 1.63(H) 1.93(H)  BUN/Creat Ratio 10 - _0 Sodium 134 - 144 mmol/L 135 133(L) 135  Potassium 3.5 - 5.2 mmol/L 4.7 3.9 4.5  Chloride 96 - 106 mmol/L 96 91(L) 94(L)  CO2 20 - 29 mmol/L _1 Calcium 8.6 - 10.2 mg/dL 9.2 8.9 9.0   Hepatic Function Latest Ref Rng & Units 06/02/2018 11/25/2015  Total Protein 6.0 - 8.5 g/dL 6.6 7.1  Albumin 3.5 - 4.8 g/dL 4.2 4.3  AST 0 - 40 IU/L 24 15  ALT 0 - 44 IU/L 31 16  Alk Phosphatase 39 - 117 IU/L 66 58  Total Bilirubin 0.0 - 1.2 mg/dL 0.8 0.6   CBC Latest Ref Rng & Units 09/22/2018 09/04/2018 06/02/2018  WBC 3.4 - 10.8 x10E3/uL 8.2 7.1 7.8  Hemoglobin 13.0 - 17.7 g/dL 12.2(L) 11.9(L) 11.8(L)  Hematocrit 37.5 - 51.0 % 37.1(L) 35.9(L) 34.7(L)  Platelets 150 - 450 x10E3/uL 205 199 189   Lab Results  Component Value Date   MCV 91 09/22/2018   MCV 90 09/04/2018   MCV 88 06/02/2018   Lab Results  Component Value Date   TSH 1.890 06/02/2018     Lab Results  Component Value Date   HGBA1C 10.7 (A) 11/01/2014   Lipid Panel        Component Value Date/Time   CHOL 97 (L) 06/02/2018 0811   TRIG 78 06/02/2018 0811   HDL 34 (L) 06/02/2018 9741  CHOLHDL 2.9 06/02/2018 0811   LDLCALC 47 06/02/2018 0811    RADIOLOGY: No results found.  IMPRESSION:  1. Essential hypertension   2. Coronary artery disease due to lipid rich plaque   3. Hx of CABG   4. Cellulitis of left lower extremity   5. PAF (paroxysmal atrial fibrillation) ([place])   6. Bilateral leg edema   7. Bradycardia   8. Anticoagulated     ASSESSMENT AND PLAN:  Mr. Ollis is a 78 year old white male who is status post CABG revascularization surgery in 1997 and has a documented occluded LIMA. He has previously undergone intervention both to his proximal LAD extending into the left main with stenting and stenting of his mid LAD. In June 2013 he developed severe in-stent restenosis of his LAD stent and a DES stent was in place at that time. We also went through the stent struts in the left main and performed PTCA of the distal circumflex. He had mild/moderate disease in his PDA after his SVG to his PDA insertion.   A nuclear perfusion study in June 2014 after experiencing some vague episodes of chest pain was low risk and showed distal anteroseptal scar with the extent of 10% without associated ischemia.  His nuclear study in April 2017 revealed a large new defect inferolaterally with scar/ischemia that was not present in 2014 led to repeat cardiac catheterization.  His ischemia was secondary to new high-grade stenosis in the mid PDA and distal PDA for which he underwent successful stenting of the 95% stenosis which was reduced to 0% and PTCA of the very distal 75% stenosis reduced to 0%.  He was enrolled in the research trial and received a polymer free BioFreedom stent.  His aspirin was subsequently to been stopped and he was started on Plavix 75 mg daily.  He has not had any bleeding or GI issues.  He had  developed significant lower extremity edema  necessitating reduction of amlodipine.  His ECG of May 19, 2018 showed atrial fibrillation of questionable duration.  Since that evaluation amlodipine has been discontinued.  Plavix was discontinued and he was started on Eliquis.  He has tolerated this well without bleeding.  Echo Doppler study showed an EF of 45 to 50%.  His left atrium was only mildly dilated at 45 mm.  He had developed recurrent atrial fibrillation and had undergone successful cardioversion in February 2020.  Presently his ECG shows sinus bradycardia on his current regimen of amiodarone at reduced dose of 100 mg daily in addition to his metoprolol succinate 75 mg.  With his bradycardia, I have recommended he reduce his Toprol to 50 mg.  However his blood pressure is elevated and I have further titrated hydralazine from 25 mg twice a day to 50 mg twice a day.  I am concerned that he has developed a cellulitis of his left lower extremity and have given him a prescription for cephalexin 500 mg 4 times a day for the next 7 to 10 days.  I have recommended he follow-up with his primary physician Dr. Elissa Hefty to make certain that this is improved.  He is not having any anginal symptoms.  He continues to be on insulin, Trulicity and metformin for his diabetes mellitus.  He is on levothyroxine for hypothyroidism.  I will see him in 3 months for follow-up evaluation or sooner if problems arise.  Time spent: 30 minutes Troy Sine, MD, [facility]  05/16/2019 4:44 PM

## 2019-05-14 NOTE — Telephone Encounter (Signed)
Please advise if ok to refill Hydralazine 25 mg bid. Medication last filled Historical provider.

## 2019-05-14 NOTE — Patient Instructions (Signed)
Medication Instructions:  TAKE KEFLEX 500MG  FOUR TIMES DAILY FOR 10 DAYS  DECREASE METOPROLOL 50MG  DAILY  INCREASE HYDRALAZINE 50MG  TWICE DAILY If you need a refill on your cardiac medications before your next appointment, please call your pharmacy.  Special Instructions: MAKE SURE TO CALL DR SCHULTZ TO FOLLOW UP ON THE CELLULITIS  Follow-Up: You will need a follow up appointment in 3 months.  You may see Shelva Majestic, MD or one of the following Advanced Practice Providers on your designated Care Team:  Almyra Deforest, PA-C  Fabian Sharp, PA-C     At [facility], you and your health needs are our priority.  As part of our continuing mission to provide you with exceptional heart care, we have created designated Provider Care Teams.  These Care Teams include your primary Cardiologist (physician) and Advanced Practice Providers (APPs -  Physician Assistants and Nurse Practitioners) who all work together to provide you with the care you need, when you need it.  Thank you for choosing [facility]!!

## 2019-05-16 ENCOUNTER — Encounter: Payer: Self-pay | Admitting: Cardiovascular Disease

## 2019-05-19 ENCOUNTER — Telehealth: Payer: Self-pay | Admitting: Cardiovascular Disease

## 2019-05-19 DIAGNOSIS — L03116 Cellulitis of left lower limb: Secondary | ICD-10-CM | POA: Diagnosis not present

## 2019-05-19 DIAGNOSIS — R600 Localized edema: Secondary | ICD-10-CM

## 2019-05-19 NOTE — Telephone Encounter (Signed)
[name] called wanting to speak to the nurse about her father's medications.

## 2019-05-19 NOTE — Telephone Encounter (Signed)
Called and left voicemail to call back to discuss.

## 2019-05-21 NOTE — Telephone Encounter (Signed)
Spoke with pt daughter, there is confusion about the metolazone. Looking at the directions on the medication list it says to take 1 tablet daily for 10 days also take 1 tablet with lasix for 2 days and save the rest. She gave him metolazone Friday and Saturday, he has had none since then. The patient is arguing with her that he needs to take it everyday. His swelling is okay right now but the patient will not always let her look at his legs. He will not weighing daily. Will forward to hao meng and dr Claiborne Billings to review and advise.

## 2019-05-21 NOTE — Telephone Encounter (Signed)
He should not be on metolazone for 10 straight days.  Would discontinue and if swelling recurs can take a dose to his Lasix 1 or 2 days/week if significant edema develops

## 2019-05-22 ENCOUNTER — Telehealth: Payer: Self-pay | Admitting: Cardiovascular Disease

## 2019-05-22 MED ORDER — METOLAZONE 10 MG PO TABS: ORAL_TABLET | ORAL | 0 refills | Status: DC

## 2019-05-22 NOTE — Telephone Encounter (Signed)
[name] called stating her father took his heart medication this morning and no sooner that he took he vomited it back up.  She wants to know if he should take another one.  Please call her at [phone] or [phone].

## 2019-05-22 NOTE — Telephone Encounter (Signed)
Just have patient continue with next due doses.  Don't try to make up what he lost this morning.  That many meds may on a sick stomach and he might just vomit again.

## 2019-05-22 NOTE — Telephone Encounter (Signed)
Please advise what patient should do.

## 2019-05-22 NOTE — Telephone Encounter (Signed)
Left detailed message for patients daughter of dr [facility] recommendations

## 2019-05-22 NOTE — Telephone Encounter (Signed)
Called patient daughter- advised of message from PharmD, as well as message everyday from MD.  Daughter verbalized understanding.   Patient daughter concerned about him getting sick for the past few days- advised her to contact PCP office and let them know, since he has issues with cellulitis, to make sure that has not worsened.  Daughter verbalized understanding.

## 2019-05-25 ENCOUNTER — Other Ambulatory Visit: Payer: Self-pay

## 2019-05-25 MED ORDER — AMIODARONE HCL 200 MG PO TABS: 100.0000 mg | ORAL_TABLET | Freq: Every day | ORAL | 1 refills | Status: DC

## 2019-05-26 NOTE — Telephone Encounter (Signed)
Thank you for letting me know looks like it hasn't been updated on the care teams list we received from [facility].

## 2019-05-27 ENCOUNTER — Telehealth: Payer: Self-pay | Admitting: Cardiovascular Disease

## 2019-05-27 DIAGNOSIS — L03116 Cellulitis of left lower limb: Secondary | ICD-10-CM | POA: Diagnosis not present

## 2019-05-27 NOTE — Telephone Encounter (Signed)
Spoke with pt and he states the cellulitis hasn't completely cleared up yet.  Advised pt, per Dr. Evette Georges OV note, he was to f/u with PCP about cellulitis.  Advised to reach out to Dr. Delena Bali for further treatment.  Pt appreciative for call.

## 2019-05-27 NOTE — Telephone Encounter (Signed)
New Message    *STAT* If patient is at the pharmacy, call can be transferred to refill team.   1. Which medications need to be refilled? (please list name of each medication and dose if known) cephALEXin (KEFLEX) 500 MG capsule    2. Which pharmacy/location (including street and city if local pharmacy) is medication to be sent to?[id number] - [place],  - [address] AT [place] [address]  3. Do they need a 30 day or 90 day supply? [place]

## 2019-06-01 DIAGNOSIS — I10 Essential (primary) hypertension: Secondary | ICD-10-CM | POA: Diagnosis not present

## 2019-06-01 DIAGNOSIS — E11622 Type 2 diabetes mellitus with other skin ulcer: Secondary | ICD-10-CM | POA: Diagnosis not present

## 2019-06-01 DIAGNOSIS — I87312 Chronic venous hypertension (idiopathic) with ulcer of left lower extremity: Secondary | ICD-10-CM | POA: Diagnosis not present

## 2019-06-01 DIAGNOSIS — L97822 Non-pressure chronic ulcer of other part of left lower leg with fat layer exposed: Secondary | ICD-10-CM | POA: Diagnosis not present

## 2019-06-04 DIAGNOSIS — I10 Essential (primary) hypertension: Secondary | ICD-10-CM | POA: Diagnosis not present

## 2019-06-04 DIAGNOSIS — L97822 Non-pressure chronic ulcer of other part of left lower leg with fat layer exposed: Secondary | ICD-10-CM | POA: Diagnosis not present

## 2019-06-04 DIAGNOSIS — E11622 Type 2 diabetes mellitus with other skin ulcer: Secondary | ICD-10-CM | POA: Diagnosis not present

## 2019-06-08 DIAGNOSIS — I10 Essential (primary) hypertension: Secondary | ICD-10-CM | POA: Diagnosis not present

## 2019-06-08 DIAGNOSIS — E11622 Type 2 diabetes mellitus with other skin ulcer: Secondary | ICD-10-CM | POA: Diagnosis not present

## 2019-06-08 DIAGNOSIS — I87312 Chronic venous hypertension (idiopathic) with ulcer of left lower extremity: Secondary | ICD-10-CM | POA: Diagnosis not present

## 2019-06-08 DIAGNOSIS — L97829 Non-pressure chronic ulcer of other part of left lower leg with unspecified severity: Secondary | ICD-10-CM | POA: Diagnosis not present

## 2019-06-15 ENCOUNTER — Other Ambulatory Visit: Payer: Self-pay | Admitting: Cardiovascular Disease

## 2019-06-15 DIAGNOSIS — E119 Type 2 diabetes mellitus without complications: Secondary | ICD-10-CM | POA: Diagnosis not present

## 2019-06-15 DIAGNOSIS — I87302 Chronic venous hypertension (idiopathic) without complications of left lower extremity: Secondary | ICD-10-CM | POA: Diagnosis not present

## 2019-06-15 DIAGNOSIS — L97829 Non-pressure chronic ulcer of other part of left lower leg with unspecified severity: Secondary | ICD-10-CM | POA: Diagnosis not present

## 2019-06-15 DIAGNOSIS — E11622 Type 2 diabetes mellitus with other skin ulcer: Secondary | ICD-10-CM | POA: Diagnosis not present

## 2019-06-15 DIAGNOSIS — Z8631 Personal history of diabetic foot ulcer: Secondary | ICD-10-CM | POA: Diagnosis not present

## 2019-06-15 DIAGNOSIS — I10 Essential (primary) hypertension: Secondary | ICD-10-CM | POA: Diagnosis not present

## 2019-06-19 ENCOUNTER — Other Ambulatory Visit: Payer: Self-pay | Admitting: Cardiovascular Disease

## 2019-06-23 DIAGNOSIS — H25811 Combined forms of age-related cataract, right eye: Secondary | ICD-10-CM | POA: Diagnosis not present

## 2019-06-23 NOTE — Telephone Encounter (Signed)
Tried to call patient to verify dose, unable to reach or leave message.

## 2019-06-24 NOTE — Telephone Encounter (Signed)
PER LOV:  DECREASE METOPROLOL 50MG  DAILY

## 2019-06-30 DIAGNOSIS — E11311 Type 2 diabetes mellitus with unspecified diabetic retinopathy with macular edema: Secondary | ICD-10-CM | POA: Diagnosis not present

## 2019-06-30 DIAGNOSIS — E1136 Type 2 diabetes mellitus with diabetic cataract: Secondary | ICD-10-CM | POA: Diagnosis not present

## 2019-06-30 DIAGNOSIS — I10 Essential (primary) hypertension: Secondary | ICD-10-CM | POA: Diagnosis not present

## 2019-06-30 DIAGNOSIS — H25811 Combined forms of age-related cataract, right eye: Secondary | ICD-10-CM | POA: Diagnosis not present

## 2019-06-30 DIAGNOSIS — E039 Hypothyroidism, unspecified: Secondary | ICD-10-CM | POA: Diagnosis not present

## 2019-06-30 DIAGNOSIS — Z951 Presence of aortocoronary bypass graft: Secondary | ICD-10-CM | POA: Diagnosis not present

## 2019-06-30 DIAGNOSIS — K219 Gastro-esophageal reflux disease without esophagitis: Secondary | ICD-10-CM | POA: Diagnosis not present

## 2019-06-30 DIAGNOSIS — E785 Hyperlipidemia, unspecified: Secondary | ICD-10-CM | POA: Diagnosis not present

## 2019-06-30 DIAGNOSIS — Z955 Presence of coronary angioplasty implant and graft: Secondary | ICD-10-CM | POA: Diagnosis not present

## 2019-06-30 DIAGNOSIS — I2510 Atherosclerotic heart disease of native coronary artery without angina pectoris: Secondary | ICD-10-CM | POA: Diagnosis not present

## 2019-06-30 DIAGNOSIS — H259 Unspecified age-related cataract: Secondary | ICD-10-CM | POA: Diagnosis not present

## 2019-07-08 ENCOUNTER — Ambulatory Visit: Payer: Medicare Other | Admitting: Sports Medicine

## 2019-07-13 ENCOUNTER — Ambulatory Visit: Payer: Medicare Other | Admitting: Podiatry

## 2019-07-14 ENCOUNTER — Other Ambulatory Visit: Payer: Self-pay | Admitting: *Deleted

## 2019-07-14 MED ORDER — APIXABAN 5 MG PO TABS: 5.0000 mg | ORAL_TABLET | Freq: Two times a day (BID) | ORAL | 2 refills | Status: DC

## 2019-07-14 NOTE — Telephone Encounter (Signed)
Rx has been sent to the pharmacy electronically. ° °

## 2019-07-20 DIAGNOSIS — N481 Balanitis: Secondary | ICD-10-CM | POA: Diagnosis not present

## 2019-07-21 ENCOUNTER — Other Ambulatory Visit: Payer: Self-pay

## 2019-07-21 ENCOUNTER — Other Ambulatory Visit: Payer: Self-pay | Admitting: Podiatry

## 2019-07-21 ENCOUNTER — Ambulatory Visit (INDEPENDENT_AMBULATORY_CARE_PROVIDER_SITE_OTHER): Payer: Medicare Other | Admitting: Podiatry

## 2019-07-21 ENCOUNTER — Ambulatory Visit (INDEPENDENT_AMBULATORY_CARE_PROVIDER_SITE_OTHER): Payer: Medicare Other

## 2019-07-21 DIAGNOSIS — M21962 Unspecified acquired deformity of left lower leg: Secondary | ICD-10-CM | POA: Diagnosis not present

## 2019-07-21 DIAGNOSIS — I2510 Atherosclerotic heart disease of native coronary artery without angina pectoris: Secondary | ICD-10-CM

## 2019-07-21 DIAGNOSIS — L97521 Non-pressure chronic ulcer of other part of left foot limited to breakdown of skin: Secondary | ICD-10-CM | POA: Diagnosis not present

## 2019-07-21 DIAGNOSIS — M79672 Pain in left foot: Secondary | ICD-10-CM

## 2019-07-21 DIAGNOSIS — M722 Plantar fascial fibromatosis: Secondary | ICD-10-CM

## 2019-07-28 ENCOUNTER — Ambulatory Visit (INDEPENDENT_AMBULATORY_CARE_PROVIDER_SITE_OTHER): Payer: Medicare Other | Admitting: Cardiovascular Disease

## 2019-07-28 ENCOUNTER — Encounter: Payer: Self-pay | Admitting: Cardiovascular Disease

## 2019-07-28 ENCOUNTER — Other Ambulatory Visit: Payer: Self-pay

## 2019-07-28 VITALS — BP 163/75 | HR 53 | Temp 97.7°F | Ht 67.0 in | Wt 196.2 lb

## 2019-07-28 DIAGNOSIS — Z7901 Long term (current) use of anticoagulants: Secondary | ICD-10-CM

## 2019-07-28 DIAGNOSIS — I2510 Atherosclerotic heart disease of native coronary artery without angina pectoris: Secondary | ICD-10-CM | POA: Diagnosis not present

## 2019-07-28 DIAGNOSIS — I10 Essential (primary) hypertension: Secondary | ICD-10-CM | POA: Diagnosis not present

## 2019-07-28 DIAGNOSIS — Z951 Presence of aortocoronary bypass graft: Secondary | ICD-10-CM

## 2019-07-28 DIAGNOSIS — I480 Paroxysmal atrial fibrillation: Secondary | ICD-10-CM | POA: Diagnosis not present

## 2019-07-28 DIAGNOSIS — R600 Localized edema: Secondary | ICD-10-CM

## 2019-07-28 DIAGNOSIS — E039 Hypothyroidism, unspecified: Secondary | ICD-10-CM

## 2019-07-28 DIAGNOSIS — E785 Hyperlipidemia, unspecified: Secondary | ICD-10-CM

## 2019-07-28 MED ORDER — HYDRALAZINE HCL 50 MG PO TABS: 75.0000 mg | ORAL_TABLET | Freq: Two times a day (BID) | ORAL | 6 refills | Status: DC

## 2019-07-28 MED ORDER — FUROSEMIDE 40 MG PO TABS: ORAL_TABLET | ORAL | 6 refills | Status: DC

## 2019-07-28 NOTE — Patient Instructions (Signed)
Medication Instructions:  Your physician has recommended you make the following change in your medication:   INCREASE YOUR HYDRALAZINE TO 75 MG. ONE TABLET BY MOUTH TWICE A DAY  FOLLOW THESE INSTRUCTIONS FOR YOUR FUROSEMIDE (LASIX): TAKE ONE TABLET (40 MG) BY MOUTH EVERY MORNING AND HALF A TABLET (20 MG) BY MOUTH EVERY OTHER AFTERNOON  *If you need a refill on your cardiac medications before your next appointment, please call your pharmacy*  Lab Work: none If you have labs (blood work) drawn today and your tests are completely normal, you will receive your results only by: Marland Kitchen MyChart Message (if you have MyChart) OR . A paper copy in the mail If you have any lab test that is abnormal or we need to change your treatment, we will call you to review the results.  Testing/Procedures: none  Follow-Up: At [facility], you and your health needs are our priority.  As part of our continuing mission to provide you with exceptional heart care, we have created designated Provider Care Teams.  These Care Teams include your primary Cardiologist (physician) and Advanced Practice Providers (APPs -  Physician Assistants and Nurse Practitioners) who all work together to provide you with the care you need, when you need it.  Your next appointment:   4 month(s)  The format for your next appointment:   Either In Person or Virtual  Provider:   Shelva Majestic, MD  Other Instructions Please follow up with your Primary Care Provider

## 2019-07-28 NOTE — Progress Notes (Signed)
Patient ID: Roberto Haley, male   DOB: [birth year], 78 y.o.   MRN: [id number]     Primary M.D.: Dr. Nelda Bucks  HPI: Roberto Haley is a 78 y.o. male who presents to the office for a 2 month cardiology followup evaluation.   Roberto Haley has known CAD and underwent CABG revascularization surgery in 1997 with LIMA to LAD, vein to the RCA. In March 1998 Roberto Haley underwent complex intervention to his native LAD and high speed rotational atherectomy (HSRA) and stenting of his LAD and PTCA of his diagonal vessel after Roberto Haley was found to have stenosis in the LIMA graft. In June 2013 due to 99% in-stent restenosis in the LAD stent and a new stenosis of 90% the distal circumflex, Roberto Haley underwent two-vessel intervention with insertion of a 2.75x22 mm resident stent in the LAD and PTCA of the distal circumflex via his previously placed stent which started in the mid left main coronary artery. A cardiopulmonary met test showed reduced maximum oxygen consumption. I  reduced his beta blocker therapy due to significant chronotropic incompetence and further titrated his Ranexa to 1000 mg twice a day.  A nuclear perfusion study in June 2014 showed distal anteroseptal scar without significant ischemia. Roberto Haley did have PACs and PVCs in gating was not done.  Roberto Haley fell on the Fourth of July 2014 and landed on his head. Roberto Haley ultimately was transferred to [facility] where was hospitalized for a week. Roberto Haley did not have neurosurgery. 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 Roberto Haley then spent 2 months at [facility] rehabilitation unit and has spent additional time a universal rehabilitation in [place] until October 31.  Roberto Haley is significant improved following his head trauma with return of his memory.  Last year, I reduced his beta blocker therapy due to bradycardia.  Roberto Haley underwent a nuclear perfusion study on 11/22/2015 which now showed an ejection fraction of 42% with inferolateral hypokinesis.   There was a new large defect in the inferolateral wall and there was no change in his previous anteroseptal defect.  There was concern for scar/ischemia inferolaterally which was new.   Roberto Haley underwent cardiac catheterization by me on 12/02/2015 and was found to have significant native CAD with a patent stent extending from the mid left main into the proximal LAD, 50% narrowings in the LAD after the first diagonal vessel and widely patent mid LAD stent.  There was pain PTCA sites in the left circumflex proximally at the mid segment with 50% stenosis a small OM branch.  There was new total occlusion of his proximal native RCA with faint bridging collaterals and faint collateralization to the distal branch of the RCA which had not been supplied by the vein graft.  Roberto Haley had previously documented occluded LIMA graft.  The vein graft supplying the PDA and PLA vessel was patent but Roberto Haley had new distal disease with 95% stenosis in the mid PDA and very distal 75% stenosis.  Roberto Haley underwent successful intervention to his PDA and due to his prior history of head trauma and CNS bleed.  Roberto Haley underwent insertion of a research BioFreedom stent and after  30 days was transitioned to only single anti platelet therapy rather than DAPT.  Since undergoing his PCI to his mid PDA Roberto Haley has noted marked improvement in his ability to be active.  Roberto Haley continues to participate in cardiac rehabilitation and often walks 1 mile in the morning before breakfast and feels well.  Roberto Haley has noticed occasional angina episodes of angina, particularly when Roberto Haley is tired.    In June 2017 Roberto Haley had a brief episode of PAF, alt doing cardiac rehabilitation.  Roberto Haley states that Roberto Haley has not been sleeping very well.  Roberto Haley has not had any GI issues, but Roberto Haley has only had one colonoscopy in his life and Roberto Haley is 77 years old.  Roberto Haley is seen by Dr. Lyda Jester in [place] and was scheduled for screening colonoscopy and EGD.  When I last saw him, Roberto Haley had noticed some vague exertional chest  discomfort which did not occur during walks in the early morning, but Roberto Haley had noticed it more at the end of the day.  I further titrated his isosorbide and increase this to 90 mg in the morning and 30 mg at night.  Roberto Haley has continued Toprol-XL for beta blocker therapy.  I referred him for a nuclear perfusion study which was done on 10/18/2016.  This showed a calculated EF of 48% but visually it appeared to be 55%.  There was mild possible scar as well as a component of diaphragmatic attenuation but a small area of ischemia could not be completely excluded apically.   When I saw him in October 2018 Roberto Haley was doing well from a cardiac standpoint and was walking 1-1/2-2 miles per day in the morning and 3 days per week also going to cardiac rehabilitation.  Roberto Haley denied chest pain, PND, orthopnea.  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 Laboratory by Dr. Eden Emms.: Glucose was 163.  Creatinine had increased to 1.61.  Lipids were excellent with a total cholesterol 107, triglycerides 71, HDL 41, and LDL 52.  Hemoglobin A1c was 8.0.   Roberto Haley has been caring for his wife who has developed Alzheimer's disease.  As result Roberto Haley is her primary caregiver.  Roberto Haley is not walking as often.  Over the past month Roberto Haley has noticed some increased leg swelling and saw Dr. Delena Bali last week.  At that time, his amlodipine was reduced from 10 mg down to 5 mg and Roberto Haley was given a prescription for furosemide.  I saw him on May 19, 2018 for one-year evaluation.  At that time, Roberto Haley had noticed some irregularity to his heart rhythm.  Roberto Haley was having lower extremity edema.  His ECG showed atrial fibrillation which was new and of questionable duration.  I discontinued amlodipine, increase furosemide to 40 mg twice a day for 3 days and added hydralazine 25 mg twice a day for his blood pressure.  I initiated Eliquis 5 mg twice a day for anticoagulation and recommended Roberto Haley discontinue Plavix.  Underwent an echo Doppler study on May 28, 2017 which showed an  EF of 45 to 50%.  Mild Roberto and mild TR.  RV systolic function was mildly reduced.  Laboratory on June 02, 2018 revealed a magnesium of 1.9.  Creatinine had increased to 1.57.    I saw him in follow-up 2 weeks later on June 05, 2018.  Roberto Haley was still in atrial fibrillation at 67 bpm.  Roberto Haley underwent an echo Doppler study on May 28, 2018 which showed an EF of 45 to 50%.  There was moderate aortic sclerosis with trivial AR.  There was mild Roberto.  Of note, his left atrium was only mildly dilated.  There was mild TR.  His ECG confirmed persistent atrial fibrillation.  His blood pressure was improved with the addition of hydralazine.  During that office visit I titrated Toprol to 75 mg daily.  Presently, Roberto Haley feels well.  Roberto Haley walked on a treadmill today and walked 2 miles without symptoms.  Roberto Haley denies any tachypalpitations, presyncope or syncope.    When I evaluated him on July 08, 2018 Roberto Haley continued to be in atrial fibrillation at 73 bpm.  With his left atrium only being mildly dilated, I suggested a trial of amiodarone to see if this could potentially pharmacologically cardiovert him or if unsuccessful by itself with future plans for cardioversion.  As result I started him on amiodarone 200 mg daily for 2 weeks and after 2 weeks Roberto Haley was advised to decrease Toprol to 50 mg and further titrate amiodarone to 200 mg twice a day.  Roberto Haley believes his pulse is slower.  Roberto Haley denies chest pain, PND, orthopnea or dizziness.    I saw him in January 2020 at which time Roberto Haley remained in atrial fibrillation.  Roberto Haley subsequently underwent outpatient DC cardioversion by me in February 2020 with restoration of sinus rhythm.    I last saw him in October 2020 at which time Roberto Haley denied any chest pain or awareness of recurrent atrial fibrillation.  Roberto Haley was having significant swelling and redness in his legs and also developed a superficial wound in his left lower extremity pretibial region.  During that evaluation I was concerned about possible  cellulitis and initiated antibiotic therapy with subsequent wound care evaluation.  During his evaluation Roberto Haley also was bradycardic and I reduce metoprolol succinate from 75 mg to 50 mg.  Roberto Haley was hypertensive and hydralazine was titrated from 25 mg twice a day to 50 mg twice a day.  Subsequently, Roberto Haley has felt fairly well.  His wound had healed.  Roberto Haley has only been taking furosemide 40 mg daily due to the frequent urination of his previous twice daily dosing.  His leg edema is improved but still is present.  Roberto Haley is unaware of any chest pain or recurrent atrial fibrillation.  Roberto Haley presents for evaluation.     Past Medical History:  Diagnosis Date  . Arthritis   . Chronic kidney disease    CKD STAGE 3;  CHRONIC RENAL INSUFFICIENCY  . Coronary artery disease   . Diabetes mellitus   . Groin hematoma 02/19/12   RIGHT  . Hyperlipidemia   . Hypertension   . Neuropathy due to type 2 diabetes mellitus ([place])   . Obesity   . PVC (premature ventricular contraction)   . SDH (subdural hematoma) ([place]) 02/06/2013   R parafalcine SDH after a fall, rx at [facility]    Past Surgical History:  Procedure Laterality Date  . CARDIAC CATHETERIZATION  01/14/12   LV FXN EF 50-55% W/MILD DISTAL INFERIOR HYPOKINESIS; PCI: LAD PTCA/STENT W/NEW 2.75X22 RESOLUTE DES IN MID LAD POST DIALTED TO 3.08 TO 2.97 TAPER: 99%-80% TO 0; LCX: PTCA VIA LM STENT W/2.25X12 SPRINTER BALLOON: 90%-5; LAD: PATENT PROXIMAL STENT EXTENDING INTO LM W/30-40% SMOOTH NARROWING IN DISTAL PORTION OF STENT; 99% IN STENT RESTENOSIS OF MID LAD STENT   . CARDIAC CATHETERIZATION N/A 12/02/2015   Procedure: Left Heart Cath and Coronary Angiography;  Surgeon: Troy Sine, MD;  Location: [place] [facility];  Service: Cardiovascular;  Laterality: N/A;  . CARDIAC CATHETERIZATION N/A 12/02/2015   Procedure: Coronary Stent Intervention;  Surgeon: Troy Sine, MD;  Location: [place] [facility];  Service: Cardiovascular;  Laterality: N/A;  . CARDIOVERSION N/A 09/26/2018    Procedure: CARDIOVERSION;  Surgeon: Troy Sine, MD;  Location: [facility];  Service: Cardiovascular;  Laterality: N/A;  .  CORONARY ARTERY BYPASS GRAFT  1997   LIMA to the LAD and vein to the RCA;  . CORONARY STENT PLACEMENT  12/02/2015   PAD  . DOPPLER ECHOCARDIOGRAPHY  01/09/12   LV NORMAL IN SIZE, NORMAL WAL THICKNESS, EF 50-55%; MILD POSTERIOR WALL HYOPKINESIS, THERE IS MILD INFERIOR WALL HYPOKINESIS  . HYPOTHENAR FAT PAD TRANSFER    . LEFT HEART CATHETERIZATION WITH CORONARY/GRAFT ANGIOGRAM N/A 01/14/2012   Procedure: LEFT HEART CATHETERIZATION WITH Beatrix Fetters;  Surgeon: Troy Sine, MD;  Location: [facility] CATH LAB;  Service: Cardiovascular;  Laterality: N/A;  . LOWER ARTERIAL DOPPLER  01/24/12   ESSENTIALLY NORMAL RIGHT GROIN DUPLEX EVALUATION S/P THROMBIN INJECTION  . LOWER VENOUS DUPLEX  02/05/12   ESSENTIALLY NORMAL RIGHT LOWER EXTRIMTY VENOUS DUPLEX DOPPLER EVALUATION  . NM MYOVIEW LTD  01/09/12   HIGH RISK SCAN. COMPARED TO PREVIOUS STUDY, THERE IS NOW ISCHEMIA PRESENT. ABNORMAL MYOCARDIAL PERFUSION STUDY. THERE IS NEW MILD INFEROLATERAL ISCHEMIA TOWARDS THE BASE; PT TO FOLLOW UP WITH DR. [name]  . PERCUTANEOUS CORONARY STENT INTERVENTION (PCI-S) N/A 01/14/2012   Procedure: PERCUTANEOUS CORONARY STENT INTERVENTION (PCI-S);  Surgeon: Troy Sine, MD;  Location: [facility] C[facility] LAB;  Service: Cardiovascular;  Laterality: N/A;    Allergies  Allergen Reactions  . Diltiazem Other (See Comments)    Unknown  . Lovastatin Other (See Comments)    Unknown  . Proton Pump Inhibitors Other (See Comments)    Unknown   . Tramadol Nausea And Vomiting  . Codeine Rash    Current Outpatient Medications  Medication Sig Dispense Refill  . acetaminophen (TYLENOL) 500 MG tablet Take 500 mg by mouth every 6 (six) hours as needed.    Marland Kitchen amiodarone (PACERONE) 200 MG tablet Take 0.5 tablets (100 mg total) by mouth daily. 180 tablet 1  . amLODipine (NORVASC) 5 MG tablet TK 1 T PO QD    . apixaban  (ELIQUIS) 5 MG TABS tablet Take 1 tablet (5 mg total) by mouth 2 (two) times daily. 180 tablet 2  . atorvastatin (LIPITOR) 40 MG tablet Take 1 tablet (40 mg total) by mouth daily. 90 tablet 3  . benazepril (LOTENSIN) 40 MG tablet Take 1 tablet by mouth daily.    . cephALEXin (KEFLEX) 500 MG capsule Take 1 capsule (500 mg total) by mouth 4 (four) times daily. 40 capsule 0  . cholecalciferol (VITAMIN D) 1000 UNITS tablet Take 1,000 Units by mouth daily.    . clopidogrel (PLAVIX) 75 MG tablet TAKE 1 TABLET(75 MG) BY MOUTH DAILY WITH BREAKFAST 90 tablet 3  . DUREZOL 0.05 % EMUL     . fluconazole (DIFLUCAN) 150 MG tablet Take 150 mg by mouth once.    . furosemide (LASIX) 40 MG tablet Take one tablet (40 mg) by mouth every morning and half a tablet (20 mg) by mouth EVERY OTHER afternoon. 135 tablet 6  . hydrALAZINE (APRESOLINE) 50 MG tablet Take 1.5 tablets (75 mg total) by mouth 2 (two) times daily. 135 tablet 6  . insulin aspart (NOVOLOG) 100 UNIT/ML FlexPen Inject 14 Units into the skin 2 (two) times daily with a meal.     . isosorbide mononitrate (IMDUR) 60 MG 24 hr tablet TAKE 1 AND 1/2 TABLETS BY MOUTH EVERY MORNING AND 1/2 TABLET EVERY EVENING 180 tablet 3  . Lancets (ONETOUCH DELICA PLUS [id number]) MISC USE AS DIRECTED TO TEST BLOOD GLUCOSE BID    . LEVEMIR FLEXTOUCH 100 UNIT/ML Pen Inject 44 Units into the skin 2 (two) times daily.  12  . levothyroxine (SYNTHROID) 100 MCG tablet Take 1 tablet by mouth 2 (two) times daily.    Marland Kitchen LINZESS 145 MCG CAPS capsule Take 145 mcg by mouth daily before breakfast.   11  . metFORMIN (GLUCOPHAGE-XR) 750 MG 24 hr tablet Take 750 mg by mouth daily with breakfast.    . metolazone (ZAROXOLYN) 10 MG tablet Take 1 tablet with lasix 1-2 days per week as needed for swelling 10 tablet 0  . metoprolol succinate (TOPROL-XL) 50 MG 24 hr tablet Take 1 tablet (50 mg total) by mouth daily. 90 tablet 0  . Multiple Vitamin (MULTIVITAMIN WITH MINERALS) TABS Take 1 tablet by  mouth daily.    . mupirocin ointment (BACTROBAN) 2 % APP EXT AA BID PRN    . NOVOFINE AUTOCOVER 30G X 8 MM MISC     . ofloxacin (OCUFLOX) 0.3 % ophthalmic solution     . ONE TOUCH ULTRA TEST test strip   4  . oxybutynin (DITROPAN) 5 MG tablet Take 1 tablet by mouth daily.    . potassium chloride SA (K-DUR) 20 MEQ tablet Take 20 mEq by mouth daily.    . TRULICITY 1.5 UL/8.4TX SOPN Inject 1.5 mg as directed once a week.   12  . vitamin C (ASCORBIC ACID) 500 MG tablet Take 500 mg by mouth daily.    . nitroGLYCERIN (NITROSTAT) 0.4 MG SL tablet Place 1 tablet (0.4 mg total) under the tongue every 5 (five) minutes as needed. For chest pain 25 tablet 12   No current facility-administered medications for this visit.    Socially, Roberto Haley is married has one child 2 grandchildren and 2 great-grandchildren. There is no tobacco or alcohol use.  ROS General: Negative; No fevers, chills, or night sweats;  HEENT: Positive for cataracts; No changes in hearing, sinus congestion, difficulty swallowing Pulmonary: Negative; No cough, wheezing, shortness of breath, hemoptysis Cardiovascular: See history of present illness;  GI: Positive for GERD on ranitidine; No nausea, vomiting, diarrhea, or abdominal pain GU: Negative; No dysuria, hematuria, or difficulty voiding Musculoskeletal: Negative; no myalgias, joint pain, or weakness Hematologic/Oncology: Negative; no easy bruising, bleeding Endocrine: Positive for hypothyroidism, on Synthroid replacement; diabetes not well controlled Neuro: Negative; no changes in balance, headaches Skin: Positive for blister of the pretibial area of his left lower extremity with redness Psychiatric: Negative; No behavioral problems, depression Sleep: Negative; No snoring, daytime sleepiness, hypersomnolence, bruxism, restless legs, hypnogognic hallucinations, no cataplexy Other comprehensive 14 point system review is negative.  PE BP (!) 163/75   Pulse (!) 53   Temp 97.7 F  (36.5 C)   Ht 5' 7" (1.702 m)   Wt 196 lb 3.2 oz (89 kg)   SpO2 97%   BMI 30.73 kg/m    Repeat blood pressure by me was still elevated but improved at 155/78.  Wt Readings from Last 3 Encounters:  07/28/19 196 lb 3.2 oz (89 kg)  05/14/19 195 lb (88.5 kg)  03/13/19 201 lb 12.8 oz (91.5 kg)   General: Alert, oriented, no distress.  Skin: normal turgor, no rashes, warm and dry HEENT: Normocephalic, atraumatic. Pupils equal round and reactive to light; sclera anicteric; extraocular muscles intact;  Nose without nasal septal hypertrophy Mouth/Parynx benign; Mallinpatti scale 3 Neck: No JVD, no carotid bruits; normal carotid upstroke Lungs: clear to ausculatation and percussion; no wheezing or rales Chest wall: without tenderness to palpitation Heart: PMI not displaced, RRR, s1 s2 normal, 1/6 systolic murmur, no diastolic murmur, no rubs, gallops, thrills, or  heaves Abdomen: soft, nontender; no hepatosplenomehaly, BS+; abdominal aorta nontender and not dilated by palpation. Back: no CVA tenderness Pulses 2+ Musculoskeletal: full range of motion, normal strength, no joint deformities Extremities: Residual 1+ edema to the pretibial region bilaterally, no erythema;no clubbing cyanosis, Homan's sign negative  Neurologic: grossly nonfocal; Cranial nerves grossly wnl Psychologic: Normal mood and affect   ECG (independently read by me): Sinus bradycardia 53 bpm with first-degree AV block.  PR interval 228 ms.  Q waves in lead III.  October 2020 ECG (independently read by me): Sinus Bradycardia at 48; !st degree block; PR 250 msec  January 2020 ECG (independently read by me): Atrial fibrillation with ventricular rate at 55 bpm.  Small Q wave in lead III.  July 08, 2018 ECG (independently read by me): Atrial  fibrillation at 73 bpm.  Inferior Q waves lead III and aVF.  QTc interval 449 ms.  June 05, 2018 ECG (independently read by me): Atrial fibrillation at 67 bpm.  QTc interval 456  ms.  No significant ST changes.  Q waves in lead III  May 19, 2018 ECG (independently read by me): Atrial Fibrillation at 69 ; NS t changes   October 2018 ECG (independently read by me): Sinus bradycardia 57 bpm.  Q wave in lead 3.  Nondiagnostic and aVF.  April 2018 ECG (independently read by me): Normal sinus rhythm at 61 bpm.  First-degree AV block with a PR interval 206 ms.  Q wave in 3 and aVF.  June 2017 ECG (independently read by me): Sinus bradycardia 58 bpm.  Inferior Q waves.  Normal intervals.  No ST segment changes.  May 2017 ECG (independently read by me): Sinus bradycardia 59 bpm.  LVH by voltage criteria.  September 2016 ECG (independently read by me):  Normal sinus rhythm at 65 bpm. Mild voltage for LVH.  01/13/2015 ECG (independently read by me): Sinus bradycardia 58 bpm.  Normal intervals.  October 2015 ECG (independent read by me): Sinus bradycardia at 50 bpm.  No ectopy.  PR interval 196 msec,   Borderline LVH.  June 2015 ECG (independently read by me): Sinus bradycardia 52 beats per minute.  Borderline first degree AV block with a PR interval of 200 ms.  December 2014 ECG (independently read by me): Sinus rhythm at 54 beats per minute. No ectopy. Normal intervals.  ECG from 07/08/2013 : reveals sinus rhythm at 57 beats per minute. QTc interval 453 ms.  LABS: BMP Latest Ref Rng & Units 03/13/2019 02/26/2019 02/20/2019  Glucose 65 - 99 mg/dL 174(H) 148(H) 150(H)  BUN 8 - 27 mg/dL 27 31(H) 27  Creatinine 0.76 - 1.27 mg/dL 1.87(H) 1.63(H) 1.93(H)  BUN/Creat Ratio 10 - _0 Sodium 134 - 144 mmol/L 135 133(L) 135  Potassium 3.5 - 5.2 mmol/L 4.7 3.9 4.5  Chloride 96 - 106 mmol/L 96 91(L) 94(L)  CO2 20 - 29 mmol/L _1 Calcium 8.6 - 10.2 mg/dL 9.2 8.9 9.0   Hepatic Function Latest Ref Rng & Units 06/02/2018 11/25/2015  Total Protein 6.0 - 8.5 g/dL 6.6 7.1  Albumin 3.5 - 4.8 g/dL 4.2 4.3  AST 0 - 40 IU/L 24 15  ALT 0 - 44 IU/L 31 16  Alk Phosphatase 39  - 117 IU/L 66 58  Total Bilirubin 0.0 - 1.2 mg/dL 0.8 0.6   CBC Latest Ref Rng & Units 09/22/2018 09/04/2018 06/02/2018  WBC 3.4 - 10.8 x10E3/uL 8.2 7.1 7.8  Hemoglobin 13.0 - 17.7 g/dL  12.2(L) 11.9(L) 11.8(L)  Hematocrit 37.5 - 51.0 % 37.1(L) 35.9(L) 34.7(L)  Platelets 150 - 450 x10E3/uL 205 199 189   Lab Results  Component Value Date   MCV 91 09/22/2018   MCV 90 09/04/2018   MCV 88 06/02/2018   Lab Results  Component Value Date   TSH 1.890 06/02/2018     Lab Results  Component Value Date   HGBA1C 10.7 (A) 11/01/2014   Lipid Panel      Component Value Date/Time   CHOL 97 (L) 06/02/2018 0811   TRIG 78 06/02/2018 0811   HDL 34 (L) 06/02/2018 0811   CHOLHDL 2.9 06/02/2018 0811   LDLCALC 47 06/02/2018 0811    RADIOLOGY: No results found.  IMPRESSION:  1. Essential hypertension   2. CAD in native artery   3. Hx of CABG   4. PAF (paroxysmal atrial fibrillation) ([place])   5. Lower extremity edema   6. Hyperlipidemia LDL goal <70   7. Current use of anticoagulant therapy   8. Hypothyroidism, unspecified type     ASSESSMENT AND PLAN:  Roberto Haley is a 78 year old white male who is status post CABG revascularization surgery in 1997 and has a documented occluded LIMA. Roberto Haley has previously undergone intervention both to his proximal LAD extending into the left main with stenting and stenting of his mid LAD. In June 2013 Roberto Haley developed severe in-stent restenosis of his LAD stent and a DES stent was in place at that time. We also went through the stent struts in the left main and performed PTCA of the distal circumflex. Roberto Haley had mild/moderate disease in his PDA after his SVG to his PDA insertion.   A nuclear perfusion study in June 2014 after experiencing some vague episodes of chest pain was low risk and showed distal anteroseptal scar with the extent of 10% without associated ischemia.  His nuclear study in April 2017 revealed a large new defect inferolaterally with scar/ischemia that  was not present in 2014 led to repeat cardiac catheterization.  His ischemia was secondary to new high-grade stenosis in the mid PDA and distal PDA for which Roberto Haley underwent successful stenting of the 95% stenosis which was reduced to 0% and PTCA of the very distal 75% stenosis reduced to 0%.  Roberto Haley was enrolled in the research trial and received a polymer free BioFreedom stent.  His aspirin was subsequently to been stopped and Roberto Haley was started on Plavix 75 mg daily.  Roberto Haley has not had any bleeding or GI issues.  Roberto Haley had  developed significant lower extremity edema necessitating reduction of amlodipine.  His ECG of May 19, 2018 showed atrial fibrillation of questionable duration.  Since that evaluation amlodipine has been discontinued.  Plavix was discontinued and Roberto Haley was started on Eliquis.  Roberto Haley has tolerated this well without bleeding.  Echo Doppler study showed an EF of 45 to 50%.  His left atrium was only mildly dilated at 45 mm.  Roberto Haley had developed recurrent atrial fibrillation and had undergone successful cardioversion in February 2020.  Since his most recent cardioversion Roberto Haley has continued to maintain sinus rhythm.  His ECG today shows sinus bradycardia 53 bpm with first-degree AV block on amiodarone now at 100 mg daily.  At his last office visit metoprolol succinate was reduced from 75 down to 50 mg and his ventricular rate is increased from in the upper 40s to current rate at 53.  Roberto Haley is unaware of any breakthrough arrhythmia.  Roberto Haley continues to be on benazepril 40 mg,  furosemide which Roberto Haley is taking only once a day isosorbide 90 mg in the morning and 30 mg in the evening, hydralazine 50 mg twice a day in addition to Toprol-XL 50 mg.  His blood pressure today continues to be elevated and I am recommending further titration of hydralazine to 75 mg twice a day.  With his residual 1+ edema to the pretibial region I have recommended Roberto Haley take furosemide 40 mg every morning but every other day at least take 20 mg in the afternoon.   Roberto Haley has not required any supplemental metolazone.  His previous cellulitis in superficial wound in his lower extremity has have resolved.  Roberto Haley continues to be on Plavix with Eliquis without bleeding.  Roberto Haley is not having any recurrent anginal symptomatologies.  Roberto Haley continues to be on atorvastatin 40 mg for hyperlipidemia.  LDL cholesterol on January 30, 2019 was 71.  Roberto Haley is diabetic on Levemir insulin, Metformin 750 mg daily in addition to Trulicity.  Roberto Haley is on levothyroxine 100 mcg for hypothyroidism.  Roberto Haley typically sees Dr. Arvilla Market every 6 to 8 weeks.  I will see him in 4 months for follow-up cardiology evaluation.   Time spent: 25 minutes Troy Sine, MD, [facility]  07/28/2019 2:37 PM

## 2019-07-29 ENCOUNTER — Other Ambulatory Visit: Payer: Self-pay | Admitting: Podiatry

## 2019-07-29 DIAGNOSIS — M722 Plantar fascial fibromatosis: Secondary | ICD-10-CM

## 2019-08-09 ENCOUNTER — Other Ambulatory Visit: Payer: Self-pay | Admitting: Physician Assistant

## 2019-08-09 ENCOUNTER — Other Ambulatory Visit: Payer: Self-pay | Admitting: Cardiovascular Disease

## 2019-08-09 NOTE — Progress Notes (Signed)
Subjective:  Patient ID: Roberto Haley, male    DOB: 1941/05/09,  MRN: [id number]  Chief Complaint  Patient presents with  . foot lesion    Lt 2nd-3rd sub mets lesions x couple wks; 9-10/10 soreness -pt denies injury -w/ foot swellgin -wrose with stepping -2/3 days ago had bloody draiange -pt denies n/V/?FCh Tx: padding and corn removal -pt states he started yesterday on a round of abx for balanitis  . Diabetes    FBS: 178 a1C: IDK PCP: Carolanne Grumbling 1 day     79 y.o. male presents for wound care. Hx above confirmed with patient.   Review of Systems: Negative except as noted in the HPI. Denies N/V/F/Ch.  Past Medical History:  Diagnosis Date  . Arthritis   . Chronic kidney disease    CKD STAGE 3;  CHRONIC RENAL INSUFFICIENCY  . Coronary artery disease   . Diabetes mellitus   . Groin hematoma 02/19/12   RIGHT  . Hyperlipidemia   . Hypertension   . Neuropathy due to type 2 diabetes mellitus ([place])   . Obesity   . PVC (premature ventricular contraction)   . SDH (subdural hematoma) ([place]) 02/06/2013   R parafalcine SDH after a fall, rx at [facility]    Current Outpatient Medications:  .  acetaminophen (TYLENOL) 500 MG tablet, Take 500 mg by mouth every 6 (six) hours as needed., Disp: , Rfl:  .  amiodarone (PACERONE) 200 MG tablet, Take 0.5 tablets (100 mg total) by mouth daily., Disp: 180 tablet, Rfl: 1 .  amLODipine (NORVASC) 5 MG tablet, TK 1 T PO QD, Disp: , Rfl:  .  apixaban (ELIQUIS) 5 MG TABS tablet, Take 1 tablet (5 mg total) by mouth 2 (two) times daily., Disp: 180 tablet, Rfl: 2 .  atorvastatin (LIPITOR) 40 MG tablet, Take 1 tablet (40 mg total) by mouth daily., Disp: 90 tablet, Rfl: 3 .  benazepril (LOTENSIN) 40 MG tablet, Take 1 tablet by mouth daily., Disp: , Rfl:  .  cephALEXin (KEFLEX) 500 MG capsule, Take 1 capsule (500 mg total) by mouth 4 (four) times daily., Disp: 40 capsule, Rfl: 0 .  cholecalciferol (VITAMIN D) 1000 UNITS tablet, Take 1,000 Units by mouth daily., Disp: ,  Rfl:  .  clopidogrel (PLAVIX) 75 MG tablet, TAKE 1 TABLET(75 MG) BY MOUTH DAILY WITH BREAKFAST, Disp: 90 tablet, Rfl: 3 .  DUREZOL 0.05 % EMUL, , Disp: , Rfl:  .  fluconazole (DIFLUCAN) 150 MG tablet, Take 150 mg by mouth once., Disp: , Rfl:  .  furosemide (LASIX) 40 MG tablet, Take one tablet (40 mg) by mouth every morning and half a tablet (20 mg) by mouth EVERY OTHER afternoon., Disp: 135 tablet, Rfl: 6 .  hydrALAZINE (APRESOLINE) 50 MG tablet, Take 1.5 tablets (75 mg total) by mouth 2 (two) times daily., Disp: 135 tablet, Rfl: 6 .  insulin aspart (NOVOLOG) 100 UNIT/ML FlexPen, Inject 14 Units into the skin 2 (two) times daily with a meal. , Disp: , Rfl:  .  isosorbide mononitrate (IMDUR) 60 MG 24 hr tablet, TAKE 1 AND 1/2 TABLETS BY MOUTH EVERY MORNING AND 1/2 TABLET EVERY EVENING, Disp: 180 tablet, Rfl: 3 .  Lancets (ONETOUCH DELICA PLUS 123XX123) MISC, USE AS DIRECTED TO TEST BLOOD GLUCOSE BID, Disp: , Rfl:  .  LEVEMIR FLEXTOUCH 100 UNIT/ML Pen, Inject 44 Units into the skin 2 (two) times daily. , Disp: , Rfl: 12 .  levothyroxine (SYNTHROID) 100 MCG tablet, Take 1 tablet by mouth 2 (two)  times daily., Disp: , Rfl:  .  LINZESS 145 MCG CAPS capsule, Take 145 mcg by mouth daily before breakfast. , Disp: , Rfl: 11 .  metFORMIN (GLUCOPHAGE-XR) 750 MG 24 hr tablet, Take 750 mg by mouth daily with breakfast., Disp: , Rfl:  .  metolazone (ZAROXOLYN) 10 MG tablet, Take 1 tablet with lasix 1-2 days per week as needed for swelling, Disp: 10 tablet, Rfl: 0 .  metoprolol succinate (TOPROL-XL) 50 MG 24 hr tablet, Take 1 tablet (50 mg total) by mouth daily., Disp: 90 tablet, Rfl: 0 .  Multiple Vitamin (MULTIVITAMIN WITH MINERALS) TABS, Take 1 tablet by mouth daily., Disp: , Rfl:  .  mupirocin ointment (BACTROBAN) 2 %, APP EXT AA BID PRN, Disp: , Rfl:  .  nitroGLYCERIN (NITROSTAT) 0.4 MG SL tablet, Place 1 tablet (0.4 mg total) under the tongue every 5 (five) minutes as needed. For chest pain, Disp: 25  tablet, Rfl: 12 .  NOVOFINE AUTOCOVER 30G X 8 MM MISC, , Disp: , Rfl:  .  ofloxacin (OCUFLOX) 0.3 % ophthalmic solution, , Disp: , Rfl:  .  ONE TOUCH ULTRA TEST test strip, , Disp: , Rfl: 4 .  oxybutynin (DITROPAN) 5 MG tablet, Take 1 tablet by mouth daily., Disp: , Rfl:  .  potassium chloride SA (K-DUR) 20 MEQ tablet, Take 20 mEq by mouth daily., Disp: , Rfl:  .  TRULICITY 1.5 0000000 SOPN, Inject 1.5 mg as directed once a week. , Disp: , Rfl: 12 .  vitamin C (ASCORBIC ACID) 500 MG tablet, Take 500 mg by mouth daily., Disp: , Rfl:   Social History   Tobacco Use  Smoking Status Never Smoker  Smokeless Tobacco Never Used    Allergies  Allergen Reactions  . Diltiazem Other (See Comments)    Unknown  . Lovastatin Other (See Comments)    Unknown  . Proton Pump Inhibitors Other (See Comments)    Unknown   . Tramadol Nausea And Vomiting  . Codeine Rash   Objective:  There were no vitals filed for this visit. There is no height or weight on file to calculate BMI. Constitutional Well developed. Well nourished.  Vascular Dorsalis pedis pulses palpable bilaterally. Posterior tibial pulses palpable bilaterally. Capillary refill normal to all digits.  No cyanosis or clubbing noted. Pedal hair growth normal.  Neurologic Normal speech. Oriented to person, place, and time. Protective sensation absent  Dermatologic Wound Location: left 2nd/3rd MPJ Wound Base: macerated Peri-wound: Macerated Exudate: None: wound tissue dry Wound Measurements: -1x1  Orthopedic: No pain to palpation either foot.   Radiographs: none Assessment:   1. Ulcer of left foot, limited to breakdown of skin ([place])   2. Metatarsal deformity, left    Plan:  Patient was evaluated and treated and all questions answered.  Ulcer submet 2/3 -XR taken and reviewed no acute fractures no osseous erosions. -Debridement as below. -Dressed with betadine, DSD. -Continue off-loading with surgical shoe.  Procedure:  Selective Debridement of Wound Rationale: Removal of devitalized tissue from the wound to promote healing.  Pre-Debridement Wound Measurements: 1 cm x 1 cm x 0.2 cm  Post-Debridement Wound Measurements: same as pre-debridement. Type of Debridement: sharp selective Tissue Removed: Devitalized soft-tissue Dressing: Dry, sterile, compression dressing. Disposition: Patient tolerated procedure well. Patient to return in 1 week for follow-up.    No follow-ups on file.

## 2019-08-11 ENCOUNTER — Ambulatory Visit (INDEPENDENT_AMBULATORY_CARE_PROVIDER_SITE_OTHER): Payer: Medicare Other | Admitting: Podiatry

## 2019-08-11 ENCOUNTER — Encounter: Payer: Self-pay | Admitting: Podiatry

## 2019-08-11 ENCOUNTER — Other Ambulatory Visit: Payer: Self-pay

## 2019-08-11 DIAGNOSIS — M21962 Unspecified acquired deformity of left lower leg: Secondary | ICD-10-CM | POA: Diagnosis not present

## 2019-08-11 DIAGNOSIS — M2041 Other hammer toe(s) (acquired), right foot: Secondary | ICD-10-CM

## 2019-08-11 DIAGNOSIS — M2042 Other hammer toe(s) (acquired), left foot: Secondary | ICD-10-CM

## 2019-08-11 DIAGNOSIS — E1142 Type 2 diabetes mellitus with diabetic polyneuropathy: Secondary | ICD-10-CM | POA: Diagnosis not present

## 2019-08-11 DIAGNOSIS — L97521 Non-pressure chronic ulcer of other part of left foot limited to breakdown of skin: Secondary | ICD-10-CM

## 2019-08-11 NOTE — Progress Notes (Signed)
  Subjective:  Patient ID: Roberto Haley, male    DOB: 03-Oct-1940,  MRN: [id number]  Chief Complaint  Patient presents with  . Foot Ulcer    F/U Lt sub met ulcer Pt. states," it's done wonderful, healing good." -pt denies pain/draiange/N/V/F/CH Tx: neosporin -w/ hard skin -w/ redness towards leg     79 y.o. male presents for wound care. Hx confirmed with patient. States the wound is doing much better. Objective:  Physical Exam: Wound Location: left submet 2/3 Wound Measurement: 0.1x0.1 Wound Base: epithelialized Peri-wound: Calloused Exudate: None: wound tissue dry wound without warmth, erythema, signs of acute infection  Hammertoes, plantarflexed 2nd/3rd metatarsal present. Absent protective sensation.    Radiographs:  X-ray of the left foot: not indicated  Assessment:  1. Ulcer of left foot, limited to breakdown of skin ([place])   2. DM type 2 with diabetic peripheral neuropathy ([place])   3. Metatarsal deformity, left   4. Hammer toes of both feet      Plan:  Patient was evaluated and treated and all questions answered.  Ulcer left submet 2/3 -Wound debrided no active ulceration -Will make appt for DM shoes. Medically necessary given hx of ulcer, plantarflexed met, hammertoes, DM with neuropathy -Apply ointment qOD to the wound  Return in about 6 weeks (around 09/22/2019) for wound check.  MDM Number of Diagnoses or Management Options Ulcer of left foot, limited to breakdown of skin ([place]): established, improving Risk of Complications, Morbidity, and/or Mortality Presenting problems: high Management options: moderate  Patient Progress Patient progress: improved

## 2019-08-19 ENCOUNTER — Other Ambulatory Visit: Payer: Medicare Other | Admitting: Orthotics

## 2019-09-04 DIAGNOSIS — K5909 Other constipation: Secondary | ICD-10-CM | POA: Diagnosis not present

## 2019-09-04 DIAGNOSIS — E039 Hypothyroidism, unspecified: Secondary | ICD-10-CM | POA: Diagnosis not present

## 2019-09-16 ENCOUNTER — Other Ambulatory Visit: Payer: Self-pay

## 2019-09-16 ENCOUNTER — Ambulatory Visit: Payer: Medicare Other | Admitting: Orthotics

## 2019-09-16 DIAGNOSIS — M2041 Other hammer toe(s) (acquired), right foot: Secondary | ICD-10-CM

## 2019-09-16 DIAGNOSIS — E1142 Type 2 diabetes mellitus with diabetic polyneuropathy: Secondary | ICD-10-CM

## 2019-09-16 DIAGNOSIS — M21962 Unspecified acquired deformity of left lower leg: Secondary | ICD-10-CM

## 2019-09-16 DIAGNOSIS — M2042 Other hammer toe(s) (acquired), left foot: Secondary | ICD-10-CM

## 2019-09-16 DIAGNOSIS — L97521 Non-pressure chronic ulcer of other part of left foot limited to breakdown of skin: Secondary | ICD-10-CM

## 2019-09-16 NOTE — Progress Notes (Signed)

## 2019-09-22 ENCOUNTER — Ambulatory Visit: Payer: Medicare Other | Admitting: Podiatry

## 2019-09-26 ENCOUNTER — Other Ambulatory Visit: Payer: Self-pay | Admitting: Cardiovascular Disease

## 2019-09-29 ENCOUNTER — Other Ambulatory Visit: Payer: Self-pay

## 2019-09-29 ENCOUNTER — Ambulatory Visit (INDEPENDENT_AMBULATORY_CARE_PROVIDER_SITE_OTHER): Payer: Medicare Other | Admitting: Podiatry

## 2019-09-29 DIAGNOSIS — L97521 Non-pressure chronic ulcer of other part of left foot limited to breakdown of skin: Secondary | ICD-10-CM

## 2019-09-29 NOTE — Progress Notes (Signed)
  Subjective:  Patient ID: Roberto Haley, male    DOB: 12-05-1940,  MRN: [id number]  No chief complaint on file.   79 y.o. male presents for wound care. Hx confirmed with patient.  States that the wound looks much better and looks healed.  He is wearing his normal shoes and the area has not gotten worse and feels good.  Denies redness swelling drainage Objective:  Physical Exam: Wound Location: Left submet 2 3 Wound Measurement: Epithelialized Wound Base: Epithelialized Peri-wound: Normal  Assessment:   1. Ulcer of left foot, limited to breakdown of skin [facility])      Plan:  Patient was evaluated and treated and all questions answered.  Ulcer left midfoot -Wound appears healed today no necessary debridement.  Discussed signs of recurrence and to follow-up promptly should these occur.  No follow-ups on file.  MDM

## 2019-09-30 DIAGNOSIS — Z23 Encounter for immunization: Secondary | ICD-10-CM | POA: Diagnosis not present

## 2019-10-01 DIAGNOSIS — E113313 Type 2 diabetes mellitus with moderate nonproliferative diabetic retinopathy with macular edema, bilateral: Secondary | ICD-10-CM | POA: Diagnosis not present

## 2019-10-12 DIAGNOSIS — E039 Hypothyroidism, unspecified: Secondary | ICD-10-CM | POA: Diagnosis not present

## 2019-10-26 ENCOUNTER — Telehealth: Payer: Self-pay | Admitting: Podiatry

## 2019-10-26 NOTE — Telephone Encounter (Signed)
Called pt to discuss shoes should be shipping soon and I would call when they come in to schedule an appt to pick them up.Marland Kitchen  He then stated the callus has come back on his foot and is painful. I have scheduled him to see Dr March Rummage 3.29.2021.Marland Kitchen

## 2019-10-27 NOTE — Telephone Encounter (Signed)
Noted thanks °

## 2019-10-28 DIAGNOSIS — Z23 Encounter for immunization: Secondary | ICD-10-CM | POA: Diagnosis not present

## 2019-10-29 DIAGNOSIS — E113311 Type 2 diabetes mellitus with moderate nonproliferative diabetic retinopathy with macular edema, right eye: Secondary | ICD-10-CM | POA: Diagnosis not present

## 2019-11-02 ENCOUNTER — Ambulatory Visit (INDEPENDENT_AMBULATORY_CARE_PROVIDER_SITE_OTHER): Payer: Medicare Other | Admitting: Podiatry

## 2019-11-02 ENCOUNTER — Other Ambulatory Visit: Payer: Self-pay

## 2019-11-02 DIAGNOSIS — E1165 Type 2 diabetes mellitus with hyperglycemia: Secondary | ICD-10-CM | POA: Diagnosis not present

## 2019-11-02 DIAGNOSIS — L97521 Non-pressure chronic ulcer of other part of left foot limited to breakdown of skin: Secondary | ICD-10-CM | POA: Diagnosis not present

## 2019-11-02 DIAGNOSIS — Z79899 Other long term (current) drug therapy: Secondary | ICD-10-CM | POA: Diagnosis not present

## 2019-11-02 DIAGNOSIS — E785 Hyperlipidemia, unspecified: Secondary | ICD-10-CM | POA: Diagnosis not present

## 2019-11-02 DIAGNOSIS — E039 Hypothyroidism, unspecified: Secondary | ICD-10-CM | POA: Diagnosis not present

## 2019-11-02 DIAGNOSIS — E1129 Type 2 diabetes mellitus with other diabetic kidney complication: Secondary | ICD-10-CM | POA: Diagnosis not present

## 2019-11-02 DIAGNOSIS — I129 Hypertensive chronic kidney disease with stage 1 through stage 4 chronic kidney disease, or unspecified chronic kidney disease: Secondary | ICD-10-CM | POA: Diagnosis not present

## 2019-11-02 NOTE — Progress Notes (Signed)
  Subjective:  Patient ID: Roberto Haley, male    DOB: [birth year],  MRN: [id number]  Chief Complaint  Patient presents with  . Foot Ulcer    F/U Lt midfoot ulcer Pt. states," it's been realy sore the callus under my toes, I need it trimmed." tx: nonw -w/ swellging -pt denes redness/drainage   79 y.o. male presents for wound care. Hx confirmed with patient.  Objective:  Physical Exam: Wound Location: left submet 2 Wound Measurement: 0.3x0x3 Wound Base: Granular/Healthy Peri-wound: Calloused Exudate: None: wound tissue dry wound without warmth, erythema, signs of acute infection  Assessment:   1. Ulcer of left foot, limited to breakdown of skin [facility])    Plan:  Patient was evaluated and treated and all questions answered.  Ulcer left submet 2 -Ulcer cleansed and debrided -Dressed with povidone and DSD -Offloading pad applied. Additional pads dispensed.  Procedure: Excisional Debridement of Wound Rationale: Removal of non-viable soft tissue from the wound to promote healing.  Anesthesia: none Pre-Debridement Wound Measurements: overlying hyperkeratosis  Post-Debridement Wound Measurements: 0.3 cm x 0.2 cm x 0.1 cm  Type of Debridement: Sharp Excisional Tissue Removed: Non-viable soft tissue Depth of Debridement: subcutaneous tissue. Technique: Sharp excisional debridement to bleeding, viable wound base.  Dressing: Dry, sterile, compression dressing. Disposition: Patient tolerated procedure well. Patient to return in 3 week for follow-up.  Return in about 3 weeks (around 11/23/2019).

## 2019-11-15 ENCOUNTER — Other Ambulatory Visit: Payer: Self-pay | Admitting: Physician Assistant

## 2019-11-24 ENCOUNTER — Other Ambulatory Visit: Payer: Self-pay

## 2019-11-24 ENCOUNTER — Ambulatory Visit (INDEPENDENT_AMBULATORY_CARE_PROVIDER_SITE_OTHER): Payer: Medicare Other | Admitting: Podiatry

## 2019-11-24 DIAGNOSIS — L97521 Non-pressure chronic ulcer of other part of left foot limited to breakdown of skin: Secondary | ICD-10-CM

## 2019-11-24 NOTE — Progress Notes (Signed)
  Subjective:  Patient ID: Roberto Haley, male    DOB: [birth year],  MRN: [id number]  Chief Complaint  Patient presents with  . Foot Ulcer    F/U Lt sub met 2 luler Pt. states," still huts me still sroe." -pt dneis redness/welling/drianage   79 y.o. male presents for wound care. Hx confirmed with patient.  Objective:  Physical Exam: Wound Location: left submet 2 Wound Measurement: epithelialized Wound Base: epithelialized Peri-wound: Calloused Exudate: None: wound tissue dry  Assessment:   1. Ulcer of left foot, limited to breakdown of skin [facility])    Plan:  Patient was evaluated and treated and all questions answered.  Ulcer left submet 2 -Epithelialized. HPK gently debrided -Unable to dispense shoes today - the sizing does appear to be slightly too small. Will order a half size larger -F/u in 4 weeks.  No follow-ups on file.

## 2019-12-03 DIAGNOSIS — E113311 Type 2 diabetes mellitus with moderate nonproliferative diabetic retinopathy with macular edema, right eye: Secondary | ICD-10-CM | POA: Diagnosis not present

## 2019-12-07 DIAGNOSIS — E039 Hypothyroidism, unspecified: Secondary | ICD-10-CM | POA: Diagnosis not present

## 2019-12-16 ENCOUNTER — Ambulatory Visit: Payer: Medicare Other | Admitting: Orthotics

## 2019-12-16 DIAGNOSIS — L97521 Non-pressure chronic ulcer of other part of left foot limited to breakdown of skin: Secondary | ICD-10-CM

## 2019-12-16 NOTE — Progress Notes (Signed)
Shoes need to be xw to accommodate edema L

## 2019-12-17 DIAGNOSIS — E1129 Type 2 diabetes mellitus with other diabetic kidney complication: Secondary | ICD-10-CM | POA: Diagnosis not present

## 2019-12-17 DIAGNOSIS — E1165 Type 2 diabetes mellitus with hyperglycemia: Secondary | ICD-10-CM | POA: Diagnosis not present

## 2019-12-29 ENCOUNTER — Ambulatory Visit (INDEPENDENT_AMBULATORY_CARE_PROVIDER_SITE_OTHER): Payer: Medicare Other | Admitting: Podiatry

## 2019-12-29 ENCOUNTER — Other Ambulatory Visit: Payer: Self-pay

## 2019-12-29 DIAGNOSIS — L97521 Non-pressure chronic ulcer of other part of left foot limited to breakdown of skin: Secondary | ICD-10-CM | POA: Diagnosis not present

## 2019-12-29 MED ORDER — KETOCONAZOLE 2 % EX CREA: TOPICAL_CREAM | CUTANEOUS | 0 refills | Status: AC

## 2019-12-29 NOTE — Progress Notes (Signed)
  Subjective:  Patient ID: Roberto Haley, male    DOB: Nov 26, 1940,  MRN: [id number]  No chief complaint on file.  79 y.o. male presents for wound care. Thinks the wound is doing well. Denies issues or concerns. Objective:  Physical Exam: Wound Location: left submet 2 Wound Measurement: epithelialized Wound Base: epithelialized Peri-wound: Calloused Exudate: None: wound tissue dry  Assessment:   1. Ulcer of left foot, limited to breakdown of skin [facility])    Plan:  Patient was evaluated and treated and all questions answered.  Ulcer left submet 2 -Remains healed.  Tinea Pedis -Rx ketoconazole  Return in about 4 weeks (around 01/26/2020) for Diabetic Foot Care.

## 2019-12-31 DIAGNOSIS — E113311 Type 2 diabetes mellitus with moderate nonproliferative diabetic retinopathy with macular edema, right eye: Secondary | ICD-10-CM | POA: Diagnosis not present

## 2020-01-05 ENCOUNTER — Other Ambulatory Visit: Payer: Self-pay | Admitting: Cardiovascular Disease

## 2020-01-05 ENCOUNTER — Other Ambulatory Visit: Payer: Self-pay

## 2020-01-05 ENCOUNTER — Other Ambulatory Visit: Payer: Medicare Other | Admitting: Orthotics

## 2020-01-05 DIAGNOSIS — L97509 Non-pressure chronic ulcer of other part of unspecified foot with unspecified severity: Secondary | ICD-10-CM

## 2020-01-05 DIAGNOSIS — E1140 Type 2 diabetes mellitus with diabetic neuropathy, unspecified: Secondary | ICD-10-CM | POA: Diagnosis not present

## 2020-01-05 DIAGNOSIS — L84 Corns and callosities: Secondary | ICD-10-CM

## 2020-01-28 ENCOUNTER — Ambulatory Visit: Payer: Medicare Other | Admitting: Podiatry

## 2020-02-02 ENCOUNTER — Other Ambulatory Visit: Payer: Self-pay | Admitting: Cardiovascular Disease

## 2020-02-04 ENCOUNTER — Ambulatory Visit: Payer: Medicare Other | Admitting: Podiatry

## 2020-02-09 ENCOUNTER — Other Ambulatory Visit: Payer: Self-pay | Admitting: Physician Assistant

## 2020-02-25 DIAGNOSIS — E113311 Type 2 diabetes mellitus with moderate nonproliferative diabetic retinopathy with macular edema, right eye: Secondary | ICD-10-CM | POA: Diagnosis not present

## 2020-03-14 ENCOUNTER — Telehealth: Payer: Self-pay | Admitting: Cardiovascular Disease

## 2020-03-14 MED ORDER — AMLODIPINE BESYLATE 5 MG PO TABS: 5.0000 mg | ORAL_TABLET | Freq: Every day | ORAL | 3 refills | Status: AC

## 2020-03-14 NOTE — Telephone Encounter (Signed)
Returned call to patient's daughter Kalman Shan.She stated father needs amlodipine refill.Refill sent to pharmacy.

## 2020-03-14 NOTE — Telephone Encounter (Signed)
Pt c/o medication issue:  1. Name of Medication: amLODipine (NORVASC) 5 MG tablet  2. How are you currently taking this medication (dosage and times per day)? 1 tablet by mouth daily   3. Are you having a reaction (difficulty breathing--STAT)? No   4. What is your medication issue? Roberto Haley is calling wanting to know if Roberto Haley is still supposed to be on this medication. She states she reached ou tot Dr. Delena Bali thinking it was prescribed through him and they advised they have it listed this medication has been discontinued since 02/2019. If Dr. Claiborne Billings does want him to continue on this medication he will be needing a refill due to being completely out of the medication.

## 2020-03-24 DIAGNOSIS — E113311 Type 2 diabetes mellitus with moderate nonproliferative diabetic retinopathy with macular edema, right eye: Secondary | ICD-10-CM | POA: Diagnosis not present

## 2020-04-03 ENCOUNTER — Other Ambulatory Visit: Payer: Self-pay | Admitting: Cardiovascular Disease

## 2020-04-04 ENCOUNTER — Ambulatory Visit (INDEPENDENT_AMBULATORY_CARE_PROVIDER_SITE_OTHER): Payer: Medicare Other | Admitting: Podiatry

## 2020-04-04 ENCOUNTER — Other Ambulatory Visit: Payer: Self-pay | Admitting: Podiatry

## 2020-04-04 ENCOUNTER — Other Ambulatory Visit: Payer: Self-pay

## 2020-04-04 DIAGNOSIS — M21962 Unspecified acquired deformity of left lower leg: Secondary | ICD-10-CM | POA: Diagnosis not present

## 2020-04-04 DIAGNOSIS — E1142 Type 2 diabetes mellitus with diabetic polyneuropathy: Secondary | ICD-10-CM | POA: Diagnosis not present

## 2020-04-04 DIAGNOSIS — L97521 Non-pressure chronic ulcer of other part of left foot limited to breakdown of skin: Secondary | ICD-10-CM | POA: Diagnosis not present

## 2020-04-04 DIAGNOSIS — M79672 Pain in left foot: Secondary | ICD-10-CM

## 2020-04-04 NOTE — Progress Notes (Signed)
  Subjective:  Patient ID: Roberto Haley, male    DOB: 1941/01/28,  MRN: [id number]  Chief Complaint  Patient presents with  . Callouses    large-hard lesion at Lt sub met 2 - w/ swelling, no redness/draiange/open sore Tx: padding - pt states," hard-big callus ehrer the ulcer was."   . Diabetes    FBS: 158 A1C: 7 PCP: [name] x 1 mo   79 y.o. male presents for wound care. Also complains his shoes are too tight. Does not have DM inserts in the shoes because he cannot fit them. Objective:  Physical Exam: Wound Location: left submet 2 Wound Measurement: epithelialized Wound Base: epithelialized Peri-wound: Calloused Exudate: None: wound tissue dry  Assessment:   1. Ulcer of left foot, limited to breakdown of skin ([place])   2. DM type 2 with diabetic peripheral neuropathy ([name])   3. Metatarsal deformity, left    Plan:  Patient was evaluated and treated and all questions answered.  Ulcer left submet 2 -Lesion gently debrided. Remains healed but with pre-ulcerative callus. -Advised to remove the inserts in his shoes and replace them with his DM inserts -Insert offloaded with padding.   Return in about 4 weeks (around 05/02/2020) for Capsulitis, callus.

## 2020-04-05 DIAGNOSIS — H4312 Vitreous hemorrhage, left eye: Secondary | ICD-10-CM | POA: Diagnosis not present

## 2020-04-05 DIAGNOSIS — E113592 Type 2 diabetes mellitus with proliferative diabetic retinopathy without macular edema, left eye: Secondary | ICD-10-CM | POA: Diagnosis not present

## 2020-04-15 ENCOUNTER — Other Ambulatory Visit: Payer: Self-pay | Admitting: Cardiovascular Disease

## 2020-04-19 ENCOUNTER — Other Ambulatory Visit: Payer: Medicare Other | Admitting: Orthotics

## 2020-04-19 ENCOUNTER — Other Ambulatory Visit: Payer: Self-pay

## 2020-04-21 DIAGNOSIS — E113311 Type 2 diabetes mellitus with moderate nonproliferative diabetic retinopathy with macular edema, right eye: Secondary | ICD-10-CM | POA: Diagnosis not present

## 2020-04-22 ENCOUNTER — Ambulatory Visit (INDEPENDENT_AMBULATORY_CARE_PROVIDER_SITE_OTHER): Payer: Medicare Other | Admitting: Cardiovascular Disease

## 2020-04-22 ENCOUNTER — Other Ambulatory Visit: Payer: Self-pay

## 2020-04-22 ENCOUNTER — Encounter: Payer: Self-pay | Admitting: Cardiovascular Disease

## 2020-04-22 VITALS — BP 150/63 | HR 58 | Ht 68.0 in | Wt 201.0 lb

## 2020-04-22 DIAGNOSIS — R600 Localized edema: Secondary | ICD-10-CM

## 2020-04-22 DIAGNOSIS — Z951 Presence of aortocoronary bypass graft: Secondary | ICD-10-CM | POA: Diagnosis not present

## 2020-04-22 DIAGNOSIS — I10 Essential (primary) hypertension: Secondary | ICD-10-CM | POA: Diagnosis not present

## 2020-04-22 DIAGNOSIS — Z7901 Long term (current) use of anticoagulants: Secondary | ICD-10-CM

## 2020-04-22 DIAGNOSIS — Z01818 Encounter for other preprocedural examination: Secondary | ICD-10-CM

## 2020-04-22 DIAGNOSIS — Z5181 Encounter for therapeutic drug level monitoring: Secondary | ICD-10-CM

## 2020-04-22 DIAGNOSIS — I2510 Atherosclerotic heart disease of native coronary artery without angina pectoris: Secondary | ICD-10-CM | POA: Diagnosis not present

## 2020-04-22 DIAGNOSIS — E039 Hypothyroidism, unspecified: Secondary | ICD-10-CM | POA: Diagnosis not present

## 2020-04-22 DIAGNOSIS — E785 Hyperlipidemia, unspecified: Secondary | ICD-10-CM | POA: Diagnosis not present

## 2020-04-22 DIAGNOSIS — I480 Paroxysmal atrial fibrillation: Secondary | ICD-10-CM

## 2020-04-22 MED ORDER — FUROSEMIDE 40 MG PO TABS: ORAL_TABLET | ORAL | 6 refills | Status: AC

## 2020-04-22 NOTE — Patient Instructions (Signed)
Medication Instructions:  INCREASE YOUR LASIX TO 40MG  IN THE MORNING AND 20MG  IN THE AFTERNOON  *If you need a refill on your cardiac medications before your next appointment, please call your pharmacy*    Follow-Up: At [facility], you and your health needs are our priority.  As part of our continuing mission to provide you with exceptional heart care, we have created designated Provider Care Teams.  These Care Teams include your primary Cardiologist (physician) and Advanced Practice Providers (APPs -  Physician Assistants and Nurse Practitioners) who all work together to provide you with the care you need, when you need it.  We recommend signing up for the patient portal called "MyChart".  Sign up information is provided on this After Visit Summary.  MyChart is used to connect with patients for Virtual Visits (Telemedicine).  Patients are able to view lab/test results, encounter notes, upcoming appointments, etc.  Non-urgent messages can be sent to your provider as well.   To learn more about what you can do with MyChart, go to [email or web address].    Your next appointment:   4 month(s)  The format for your next appointment:   In Person  Provider:   Shelva Majestic, MD   CLEARANCE: LAST DOSE OF ELIQUIS IS Saturday 9/25- NONE ON 9/26, 9/27, 9/28 HOLD PLAVIX FOR 5 DAYS PRIOR TO SURGERY

## 2020-04-22 NOTE — Progress Notes (Signed)
Patient ID: Roberto Haley, male   DOB: 09/18/1940, 79 y.o.   MRN: [id number]     Primary M.D.: Dr. Nelda Bucks  HPI: Roberto Haley is a 79 y.o. male who presents to the office for a 10 month cardiology followup evaluation.   Roberto Haley has known CAD and underwent CABG revascularization surgery in 1997 with LIMA to LAD, vein to the RCA. In March 1998 he underwent complex intervention to his native LAD and high speed rotational atherectomy (HSRA) and stenting of his LAD and PTCA of his diagonal vessel after he was found to have stenosis in the LIMA graft. In June 2013 due to 99% in-stent restenosis in the LAD stent and a new stenosis of 90% the distal circumflex, he underwent two-vessel intervention with insertion of a 2.75x22 mm resident stent in the LAD and PTCA of the distal circumflex via his previously placed stent which started in the mid left main coronary artery. A cardiopulmonary met test showed reduced maximum oxygen consumption. I  reduced his beta blocker therapy due to significant chronotropic incompetence and further titrated his Ranexa to 1000 mg twice a day.  A nuclear perfusion study in June 2014 showed distal anteroseptal scar without significant ischemia. He did have PACs and PVCs in gating was not done.  He fell on the Fourth of July 2014 and landed on his head. He ultimately was transferred to [facility] where was hospitalized for a week. He did not have neurosurgery. He was told of having "blood on his brain."  He did have a left-sided per hemiparesis. He then spent 2 months at [facility] rehabilitation unit and has spent additional time a universal rehabilitation in [place] until October 31.  He is significant improved following his head trauma with return of his memory.  Last year, I reduced his beta blocker therapy due to bradycardia.  He underwent a nuclear perfusion study on 11/22/2015 which now showed an ejection fraction of 42% with inferolateral hypokinesis.   There was a new large defect in the inferolateral wall and there was no change in his previous anteroseptal defect.  There was concern for scar/ischemia inferolaterally which was new.   He underwent cardiac catheterization by me on 12/02/2015 and was found to have significant native CAD with a patent stent extending from the mid left main into the proximal LAD, 50% narrowings in the LAD after the first diagonal vessel and widely patent mid LAD stent.  There was pain PTCA sites in the left circumflex proximally at the mid segment with 50% stenosis a small OM branch.  There was new total occlusion of his proximal native RCA with faint bridging collaterals and faint collateralization to the distal branch of the RCA which had not been supplied by the vein graft.  He had previously documented occluded LIMA graft.  The vein graft supplying the PDA and PLA vessel was patent but he had new distal disease with 95% stenosis in the mid PDA and very distal 75% stenosis.  He underwent successful intervention to his PDA and due to his prior history of head trauma and CNS bleed.  He underwent insertion of a research BioFreedom stent and after  30 days was transitioned to only single anti platelet therapy rather than DAPT.  Since undergoing his PCI to his mid PDA he has noted marked improvement in his ability to be active.  He continues to participate in cardiac rehabilitation and often walks 1 mile in the morning before breakfast and feels well.  He has noticed occasional angina episodes of angina, particularly when he is tired.    In June 2017 he had a brief episode of PAF, alt doing cardiac rehabilitation.  He states that he has not been sleeping very well.  He has not had any GI issues, but he has only had one colonoscopy in his life and he is 79 years old.  He is seen by Dr. Lyda Jester in [place] and was scheduled for screening colonoscopy and EGD.  When I last saw him, he had noticed some vague exertional chest  discomfort which did not occur during walks in the early morning, but he had noticed it more at the end of the day.  I further titrated his isosorbide and increase this to 90 mg in the morning and 30 mg at night.  He has continued Toprol-XL for beta blocker therapy.  I referred him for a nuclear perfusion study which was done on 10/18/2016.  This showed a calculated EF of 48% but visually it appeared to be 55%.  There was mild possible scar as well as a component of diaphragmatic attenuation but a small area of ischemia could not be completely excluded apically.   When I saw him in October 2018 he was doing well from a cardiac standpoint and was walking 1-1/2-2 miles per day in the morning and 3 days per week also going to cardiac rehabilitation.  He denied chest pain, PND, orthopnea.  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 Laboratory by Dr. Eden Emms.: Glucose was 163.  Creatinine had increased to 1.61.  Lipids were excellent with a total cholesterol 107, triglycerides 71, HDL 41, and LDL 52.  Hemoglobin A1c was 8.0.   He has been caring for his wife who has developed Alzheimer's disease.  As result he is her primary caregiver.  He is not walking as often.  Over the past month he has noticed some increased leg swelling and saw Dr. Delena Bali last week.  At that time, his amlodipine was reduced from 10 mg down to 5 mg and he was given a prescription for furosemide.  I saw him on May 19, 2018 for one-year evaluation.  At that time, he had noticed some irregularity to his heart rhythm.  He was having lower extremity edema.  His ECG showed atrial fibrillation which was new and of questionable duration.  I discontinued amlodipine, increase furosemide to 40 mg twice a day for 3 days and added hydralazine 25 mg twice a day for his blood pressure.  I initiated Eliquis 5 mg twice a day for anticoagulation and recommended he discontinue Plavix.  Underwent an echo Doppler study on May 28, 2017 which showed an  EF of 45 to 50%.  Mild Roberto and mild TR.  RV systolic function was mildly reduced.  Laboratory on June 02, 2018 revealed a magnesium of 1.9.  Creatinine had increased to 1.57.    I saw him in follow-up 2 weeks later on June 05, 2018.  He was still in atrial fibrillation at 67 bpm.  He underwent an echo Doppler study on May 28, 2018 which showed an EF of 45 to 50%.  There was moderate aortic sclerosis with trivial AR.  There was mild Roberto.  Of note, his left atrium was only mildly dilated.  There was mild TR.  His ECG confirmed persistent atrial fibrillation.  His blood pressure was improved with the addition of hydralazine.  During that office visit I titrated Toprol to 75 mg daily.  Presently, he feels well.  He walked on a treadmill today and walked 2 miles without symptoms.  He denies any tachypalpitations, presyncope or syncope.    When I evaluated him on July 08, 2018 he continued to be in atrial fibrillation at 73 bpm.  With his left atrium only being mildly dilated, I suggested a trial of amiodarone to see if this could potentially pharmacologically cardiovert him or if unsuccessful by itself with future plans for cardioversion.  As result I started him on amiodarone 200 mg daily for 2 weeks and after 2 weeks he was advised to decrease Toprol to 50 mg and further titrate amiodarone to 200 mg twice a day.  He believes his pulse is slower.  He denies chest pain, PND, orthopnea or dizziness.    I saw him in January 2020 at which time he remained in atrial fibrillation.  He subsequently underwent outpatient DC cardioversion by me in February 2020 with restoration of sinus rhythm.    I  saw him in October 2020 at which time he denied any chest pain or awareness of recurrent atrial fibrillation.  He was having significant swelling and redness in his legs and also developed a superficial wound in his left lower extremity pretibial region.  During that evaluation I was concerned about possible  cellulitis and initiated antibiotic therapy with subsequent wound care evaluation.  During his evaluation he also was bradycardic and I reduce metoprolol succinate from 75 mg to 50 mg.  He was hypertensive and hydralazine was titrated from 25 mg twice a day to 50 mg twice a day.  Subsequently, he has felt fairly well.  His wound had healed.  He has only been taking furosemide 40 mg daily due to the frequent urination of his previous twice daily dosing.  His leg edema is improved but still is present.  He is unaware of any chest pain or recurrent atrial fibrillation.  During that evaluation, I recommended he take furosemide 40 mg every morning and every other day 20 mg in the afternoon.  Over the past year, he has remained fairly stable from a cardiac standpoint.  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  He was evaluated at [facility] in [place] and will require surgery.  He continues to experience leg swelling bilaterally in the pretibial region.  He has been caring for his wife with dementia.  He is unaware of recurrent atrial fibrillation or chest pain.  He presents for evaluation and clearance prior to his eye surgery.   Past Medical History:  Diagnosis Date  . Arthritis   . Chronic kidney disease    CKD STAGE 3;  CHRONIC RENAL INSUFFICIENCY  . Coronary artery disease   . Diabetes mellitus   . Groin hematoma 02/19/12   RIGHT  . Hyperlipidemia   . Hypertension   . Neuropathy due to type 2 diabetes mellitus (HCC)   . Obesity   . PVC (premature ventricular contraction)   . SDH (subdural hematoma) (HCC) 02/06/2013   R parafalcine SDH after a fall, rx at [facility]    Past Surgical History:  Procedure Laterality Date  . CARDIAC CATHETERIZATION  01/14/12   LV FXN EF 50-55% W/MILD DISTAL INFERIOR HYPOKINESIS; PCI: LAD PTCA/STENT W/NEW 2.75X22 RESOLUTE DES IN MID LAD POST DIALTED TO 3.08 TO 2.97 TAPER: 99%-80% TO 0; LCX: PTCA  VIA LM STENT W/2.25X12 SPRINTER BALLOON: 90%-5; LAD: PATENT PROXIMAL STENT EXTENDING INTO  LM W/30-40% SMOOTH NARROWING IN DISTAL PORTION OF STENT; 99% IN STENT RESTENOSIS OF MID LAD STENT   . CARDIAC CATHETERIZATION N/A 12/02/2015   Procedure: Left Heart Cath and Coronary Angiography;  Surgeon: Troy Sine, MD;  Location: [place] [facility];  Service: Cardiovascular;  Laterality: N/A;  . CARDIAC CATHETERIZATION N/A 12/02/2015   Procedure: Coronary Stent Intervention;  Surgeon: Troy Sine, MD;  Location: [place] [facility];  Service: Cardiovascular;  Laterality: N/A;  . CARDIOVERSION N/A 09/26/2018   Procedure: CARDIOVERSION;  Surgeon: Troy Sine, MD;  Location: [facility] ENDOSCOPY;  Service: Cardiovascular;  Laterality: N/A;  . CORONARY ARTERY BYPASS GRAFT  1997   LIMA to the LAD and vein to the RCA;  . CORONARY STENT PLACEMENT  12/02/2015   PAD  . DOPPLER ECHOCARDIOGRAPHY  01/09/12   LV NORMAL IN SIZE, NORMAL WAL THICKNESS, EF 50-55%; MILD POSTERIOR WALL HYOPKINESIS, THERE IS MILD INFERIOR WALL HYPOKINESIS  . HYPOTHENAR FAT PAD TRANSFER    . LEFT HEART CATHETERIZATION WITH CORONARY/GRAFT ANGIOGRAM N/A 01/14/2012   Procedure: LEFT HEART CATHETERIZATION WITH Beatrix Fetters;  Surgeon: Troy Sine, MD;  Location: [facility] CATH LAB;  Service: Cardiovascular;  Laterality: N/A;  . LOWER ARTERIAL DOPPLER  01/24/12   ESSENTIALLY NORMAL RIGHT GROIN DUPLEX EVALUATION S/P THROMBIN INJECTION  . LOWER VENOUS DUPLEX  02/05/12   ESSENTIALLY NORMAL RIGHT LOWER EXTRIMTY VENOUS DUPLEX DOPPLER EVALUATION  . NM MYOVIEW LTD  01/09/12   HIGH RISK SCAN. COMPARED TO PREVIOUS STUDY, THERE IS NOW ISCHEMIA PRESENT. ABNORMAL MYOCARDIAL PERFUSION STUDY. THERE IS NEW MILD INFEROLATERAL ISCHEMIA TOWARDS THE BASE; PT TO FOLLOW UP WITH DR. [name]  . PERCUTANEOUS CORONARY STENT INTERVENTION (PCI-S) N/A 01/14/2012   Procedure: PERCUTANEOUS CORONARY STENT INTERVENTION (PCI-S);  Surgeon: Troy Sine, MD;  Location: [facility] CATH LAB;   Service: Cardiovascular;  Laterality: N/A;    Allergies  Allergen Reactions  . Diltiazem Other (See Comments)    Unknown  . Lovastatin Other (See Comments)    Unknown  . Proton Pump Inhibitors Other (See Comments)    Unknown   . Tramadol Nausea And Vomiting  . Codeine Rash    Current Outpatient Medications  Medication Sig Dispense Refill  . acetaminophen (TYLENOL) 500 MG tablet Take 500 mg by mouth every 6 (six) hours as needed.    Marland Kitchen amiodarone (PACERONE) 200 MG tablet Take 0.5 tablets (100 mg total) by mouth daily. 180 tablet 1  . amLODipine (NORVASC) 5 MG tablet Take 1 tablet (5 mg total) by mouth daily. 90 tablet 3  . atorvastatin (LIPITOR) 40 MG tablet Take 1 tablet (40 mg total) by mouth daily. NEED OV. 90 tablet 0  . benazepril (LOTENSIN) 40 MG tablet Take 1 tablet by mouth daily.    . cholecalciferol (VITAMIN D) 1000 UNITS tablet Take 1,000 Units by mouth daily.    . clopidogrel (PLAVIX) 75 MG tablet TAKE 1 TABLET(75 MG) BY MOUTH DAILY WITH BREAKFAST 90 tablet 3  . DUREZOL 0.05 % EMUL     . ELIQUIS 5 MG TABS tablet TAKE 1 TABLET(5 MG) BY MOUTH TWICE DAILY 180 tablet 2  . furosemide (LASIX) 40 MG tablet Take one tablet (40 mg) by mouth every morning and half a tablet (20 mg) by mouth EVERY afternoon. 135 tablet 6  . hydrALAZINE (APRESOLINE) 50 MG tablet Take 1.5 tablets (75 mg total) by mouth 2 (two) times daily. 135 tablet 6  . insulin aspart (NOVOLOG) 100 UNIT/ML FlexPen Inject 14 Units into the skin  2 (two) times daily with a meal.     . isosorbide mononitrate (IMDUR) 60 MG 24 hr tablet TAKE 1 AND 1/2 TABLETS BY MOUTH EVERY AM AND TAKE 0.5 TABLET BY MOUTH EVERY EVENING 180 tablet 3  . ketoconazole (NIZORAL) 2 % cream Apply 1 fingertip amount to each foot daily. 30 g 0  . Lancets (ONETOUCH DELICA PLUS [id number]) MISC USE AS DIRECTED TO TEST BLOOD GLUCOSE BID    . LEVEMIR FLEXTOUCH 100 UNIT/ML Pen Inject 44 Units into the skin 2 (two) times daily.   12  . levothyroxine  (SYNTHROID) 150 MCG tablet Take 150 mcg by mouth every morning.    Marland Kitchen LINZESS 145 MCG CAPS capsule Take 145 mcg by mouth daily before breakfast.   11  . metFORMIN (GLUCOPHAGE-XR) 750 MG 24 hr tablet Take 750 mg by mouth daily with breakfast.    . metoprolol succinate (TOPROL-XL) 50 MG 24 hr tablet TAKE 1 TABLET(50 MG) BY MOUTH DAILY 90 tablet 1  . Multiple Vitamin (MULTIVITAMIN WITH MINERALS) TABS Take 1 tablet by mouth daily.    . nitroGLYCERIN (NITROSTAT) 0.4 MG SL tablet Place 1 tablet (0.4 mg total) under the tongue every 5 (five) minutes as needed. For chest pain 25 tablet 12  . NOVOFINE AUTOCOVER 30G X 8 MM MISC     . ofloxacin (OCUFLOX) 0.3 % ophthalmic solution     . ONE TOUCH ULTRA TEST test strip   4  . oxybutynin (DITROPAN) 5 MG tablet Take 1 tablet by mouth daily.    . potassium chloride SA (KLOR-CON) 20 MEQ tablet TAKE 1 TABLET BY MOUTH DAILY AS NEEDED 90 tablet 1  . TRULICITY 1.5 OI/7.8MV SOPN Inject 1.5 mg as directed once a week.   12  . vitamin C (ASCORBIC ACID) 500 MG tablet Take 500 mg by mouth daily.     No current facility-administered medications for this visit.    Socially, he is married has one child 2 grandchildren and 2 great-grandchildren. There is no tobacco or alcohol use.  ROS General: Negative; No fevers, chills, or night sweats;  HEENT: Positive for cataracts; No changes in hearing, sinus congestion, difficulty swallowing Pulmonary: Negative; No cough, wheezing, shortness of breath, hemoptysis Cardiovascular: See history of present illness;  GI: Positive for GERD on ranitidine; No nausea, vomiting, diarrhea, or abdominal pain GU: Negative; No dysuria, hematuria, or difficulty voiding Musculoskeletal: Negative; no myalgias, joint pain, or weakness Hematologic/Oncology: Negative; no easy bruising, bleeding Endocrine: Positive for hypothyroidism, on Synthroid replacement; diabetes not well controlled Neuro: Negative; no changes in balance, headaches Skin: No  rash Psychiatric: Negative; No behavioral problems, depression Sleep: Negative; No snoring, daytime sleepiness, hypersomnolence, bruxism, restless legs, hypnogognic hallucinations, no cataplexy Other comprehensive 14 point system review is negative.  PE BP (!) 150/63   Pulse (!) 58   Ht $R'5\' 8"'bE$  (1.727 m)   Wt 201 lb (91.2 kg)   SpO2 99%   BMI 30.56 kg/m    Repeat blood pressure by me 138/68  Wt Readings from Last 3 Encounters:  04/22/20 201 lb (91.2 kg)  07/28/19 196 lb 3.2 oz (89 kg)  05/14/19 195 lb (88.5 kg)   General: Alert, oriented, no distress.  Skin: normal turgor, no rashes, warm and dry HEENT: Normocephalic, atraumatic. Pupils equal round and reactive to light; sclera anicteric; extraocular muscles intact;  Nose without nasal septal hypertrophy Mouth/Parynx benign; Mallinpatti scale 3 Neck: No JVD, no carotid bruits; normal carotid upstroke Lungs: clear to ausculatation and percussion; no  wheezing or rales Chest wall: without tenderness to palpitation Heart: PMI not displaced, RRR, s1 s2 normal, 1/6 systolic murmur, no diastolic murmur, no rubs, gallops, thrills, or heaves Abdomen: soft, nontender; no hepatosplenomehaly, BS+; abdominal aorta nontender and not dilated by palpation. Back: no CVA tenderness Pulses 2+ Musculoskeletal: full range of motion, normal strength, no joint deformities Extremities: 1+ pretibial edema bilaterally no clubbing cyanosis, Homan's sign negative  Neurologic: grossly nonfocal; Cranial nerves grossly wnl Psychologic: Normal mood and affect  ECG (independently read by me): Sinus bradycardia at 58 bpm with first-degree AV block with a PR interval of 226 ms.  QTc interval 471 ms.  No significant ST changes.  July 28, 2019 ECG (independently read by me): Sinus bradycardia 53 bpm with first-degree AV block.  PR interval 228 ms.  Q waves in lead III.  October 2020 ECG (independently read by me): Sinus Bradycardia at 48; !st degree block; PR  250 msec  January 2020 ECG (independently read by me): Atrial fibrillation with ventricular rate at 55 bpm.  Small Q wave in lead III.  July 08, 2018 ECG (independently read by me): Atrial  fibrillation at 73 bpm.  Inferior Q waves lead III and aVF.  QTc interval 449 ms.  June 05, 2018 ECG (independently read by me): Atrial fibrillation at 67 bpm.  QTc interval 456 ms.  No significant ST changes.  Q waves in lead III  May 19, 2018 ECG (independently read by me): Atrial Fibrillation at 69 ; NS t changes   October 2018 ECG (independently read by me): Sinus bradycardia 57 bpm.  Q wave in lead 3.  Nondiagnostic and aVF.  April 2018 ECG (independently read by me): Normal sinus rhythm at 61 bpm.  First-degree AV block with a PR interval 206 ms.  Q wave in 3 and aVF.  June 2017 ECG (independently read by me): Sinus bradycardia 58 bpm.  Inferior Q waves.  Normal intervals.  No ST segment changes.  May 2017 ECG (independently read by me): Sinus bradycardia 59 bpm.  LVH by voltage criteria.  September 2016 ECG (independently read by me):  Normal sinus rhythm at 65 bpm. Mild voltage for LVH.  01/13/2015 ECG (independently read by me): Sinus bradycardia 58 bpm.  Normal intervals.  October 2015 ECG (independent read by me): Sinus bradycardia at 50 bpm.  No ectopy.  PR interval 196 msec,   Borderline LVH.  June 2015 ECG (independently read by me): Sinus bradycardia 52 beats per minute.  Borderline first degree AV block with a PR interval of 200 ms.  December 2014 ECG (independently read by me): Sinus rhythm at 54 beats per minute. No ectopy. Normal intervals.  ECG from 07/08/2013 : reveals sinus rhythm at 57 beats per minute. QTc interval 453 ms.  LABS: BMP Latest Ref Rng & Units 03/13/2019 02/26/2019 02/20/2019  Glucose 65 - 99 mg/dL 174(H) 148(H) 150(H)  BUN 8 - 27 mg/dL 27 31(H) 27  Creatinine 0.76 - 1.27 mg/dL 1.87(H) 1.63(H) 1.93(H)  BUN/Creat Ratio 10 - $Re'24 14 19 14  'kOO$ Sodium 134 - 144  mmol/L 135 133(L) 135  Potassium 3.5 - 5.2 mmol/L 4.7 3.9 4.5  Chloride 96 - 106 mmol/L 96 91(L) 94(L)  CO2 20 - 29 mmol/L $RemoveB'26 27 25  'yzWDvaUf$ Calcium 8.6 - 10.2 mg/dL 9.2 8.9 9.0   Hepatic Function Latest Ref Rng & Units 06/02/2018 11/25/2015  Total Protein 6.0 - 8.5 g/dL 6.6 7.1  Albumin 3.5 - 4.8 g/dL 4.2 4.3  AST 0 -  40 IU/L 24 15  ALT 0 - 44 IU/L 31 16  Alk Phosphatase 39 - 117 IU/L 66 58  Total Bilirubin 0.0 - 1.2 mg/dL 0.8 0.6   CBC Latest Ref Rng & Units 09/22/2018 09/04/2018 06/02/2018  WBC 3.4 - 10.8 x10E3/uL 8.2 7.1 7.8  Hemoglobin 13.0 - 17.7 g/dL 12.2(L) 11.9(L) 11.8(L)  Hematocrit 37.5 - 51.0 % 37.1(L) 35.9(L) 34.7(L)  Platelets 150 - 450 x10E3/uL 205 199 189   Lab Results  Component Value Date   MCV 91 09/22/2018   MCV 90 09/04/2018   MCV 88 06/02/2018   Lab Results  Component Value Date   TSH 1.890 06/02/2018     Lab Results  Component Value Date   HGBA1C 10.7 (A) 11/01/2014   Lipid Panel      Component Value Date/Time   CHOL 97 (L) 06/02/2018 0811   TRIG 78 06/02/2018 0811   HDL 34 (L) 06/02/2018 0811   CHOLHDL 2.9 06/02/2018 0811   LDLCALC 47 06/02/2018 0811    RADIOLOGY: No results found.  IMPRESSION:  1. Essential hypertension   2. CAD in native artery   3. Hx of CABG   4. Paroxysmal atrial fibrillation (HCC)   5. Hyperlipidemia LDL goal <70   6. Lower extremity edema   7. Preoperative clearance   8. Alteration in anticoagulation   9. Hypothyroidism, unspecified type     ASSESSMENT AND PLAN:  Roberto Haley is a 79 year-old white male who is status post CABG revascularization surgery in 1997 and has a documented occluded LIMA. He has previously undergone intervention both to his proximal LAD extending into the left main with stenting and stenting of his mid LAD. In June 2013 he developed severe in-stent restenosis of his LAD stent and a DES stent was in place at that time. We also went through the stent struts in the left main and performed PTCA of  the distal circumflex. He had mild/moderate disease in his PDA after his SVG to his PDA insertion.   A nuclear perfusion study in June 2014 after experiencing vague episodes of chest pain was low risk and showed distal anteroseptal scar with the extent of 10% without associated ischemia.  His nuclear study in April 2017 revealed a large new defect inferolaterally with scar/ischemia that was not present in 2014 which led to repeat cardiac catheterization.  His ischemia was secondary to new high-grade stenosis in the mid PDA and distal PDA for which he underwent successful stenting of the 95% stenosis which was reduced to 0% and PTCA of the very distal 75% stenosis reduced to 0%.  He was enrolled in the research trial and received a polymer free BioFreedom stent.  His aspirin was subsequently to been stopped and he was started on Plavix 75 mg daily.  He has not had any bleeding or GI issues.  He had  developed significant lower extremity edema necessitating reduction of amlodipine.  His ECG of May 19, 2018 showed atrial fibrillation of questionable duration.  Since that evaluation amlodipine has been discontinued.  Plavix was discontinued and he was started on Eliquis.  He has tolerated this well without bleeding.  Echo Doppler study showed an EF of 45 to 50%.  His left atrium was only mildly dilated at 45 mm.  He had developed recurrent atrial fibrillation and had undergone successful cardioversion in February 2020.  He has been maintaining sinus rhythm since that cardioversion.  He had developed bradycardia leading to slight dose reduction of metoprolol succinate.  Presently he is maintaining sinus rhythm and continues to be on low-dose amiodarone at 100 mg daily.  His blood pressure today is stable on amlodipine 5 mg, benazepril 40 mg, isosorbide 90 mg in the morning and 30 mg at night, hydralazine 75 mg twice a day in addition to furosemide.  He continues to have 1+ pretibial edema and I am recommending he  increase his Lasix to 40 mg in the morning and take 20 mg at 2 PM on a daily basis rather than every other day.  With his need for upcoming eye surgery, I have recommended he hold his Eliquis for at least 48 hours prior to his surgical date and hold his Plavix for at least 5 days prior to surgery.  I have given a clearance for his eye surgery.  He continues to be on insulin, Metformin and Trulicity for his diabetes mellitus.  He is on levothyroxine 150 mcg for hypothyroidism.  He is tolerating atorvastatin 40 mg for hyperlipidemia and most recent LDL cholesterol was 59 on February 03, 2020.  I will see him in 4 months for follow-up evaluation.     Time spent: 25 minutes Troy Sine, MD, [facility]  04/24/2020 10:37 AM

## 2020-04-24 ENCOUNTER — Encounter: Payer: Self-pay | Admitting: Cardiovascular Disease

## 2020-04-26 ENCOUNTER — Telehealth: Payer: Self-pay | Admitting: Cardiovascular Disease

## 2020-04-26 NOTE — Telephone Encounter (Signed)
MD office note routed via Epic to [facility] in [place].

## 2020-04-26 NOTE — Telephone Encounter (Signed)
    Caryl Pina from [facility] in [place] would like to get a copy of pt's last OV notes stating Dr. Claiborne Billings is clearing the pt for his upcoming eye surgery, she gave fax# [phone]

## 2020-04-26 NOTE — Telephone Encounter (Deleted)
° ° °  Caryl Pina from [facility] in [place] would like to get a copy of pt's last OV notes stating Dr. Claiborne Billings is clearing the pt for his upcoming eye surgery, she gave fax# [phone]

## 2020-05-02 ENCOUNTER — Ambulatory Visit (INDEPENDENT_AMBULATORY_CARE_PROVIDER_SITE_OTHER): Payer: Medicare Other | Admitting: Podiatry

## 2020-05-02 ENCOUNTER — Encounter: Payer: Self-pay | Admitting: Podiatry

## 2020-05-02 ENCOUNTER — Other Ambulatory Visit: Payer: Self-pay

## 2020-05-02 DIAGNOSIS — L84 Corns and callosities: Secondary | ICD-10-CM | POA: Diagnosis not present

## 2020-05-02 DIAGNOSIS — L97521 Non-pressure chronic ulcer of other part of left foot limited to breakdown of skin: Secondary | ICD-10-CM

## 2020-05-02 DIAGNOSIS — B351 Tinea unguium: Secondary | ICD-10-CM | POA: Diagnosis not present

## 2020-05-02 DIAGNOSIS — E1142 Type 2 diabetes mellitus with diabetic polyneuropathy: Secondary | ICD-10-CM | POA: Diagnosis not present

## 2020-05-02 DIAGNOSIS — M21962 Unspecified acquired deformity of left lower leg: Secondary | ICD-10-CM

## 2020-05-02 NOTE — Progress Notes (Signed)
°  Subjective:  Patient ID: Roberto Haley, male    DOB: [birth year],  MRN: [id number]  Chief Complaint  Patient presents with   Callouses    i have a callus on the ball of the left foot     79 y.o. male presents with the above complaint. History confirmed with patient. States he got his DM shoes and they help.  Objective:  Physical Exam: warm, good capillary refill, nail exam onychomycosis of the toenails, no trophic changes or ulcerative lesions. DP pulses palpable, PT pulses palpable and protective sensation absent Left Foot: normal exam, no swelling, tenderness, instability; ligaments intact, full range of motion of all ankle/foot joints  Right Foot: normal exam, no swelling, tenderness, instability; ligaments intact, full range of motion of all ankle/foot joints   No images are attached to the encounter.  Assessment:   1. Ulcer of left foot, limited to breakdown of skin ([place])   2. DM type 2 with diabetic peripheral neuropathy ([place])   3. Metatarsal deformity, left   4. Callus   5. Onychomycosis      Plan:  Patient was evaluated and treated and all questions answered.  Onychomycosis, Diabetes and DPN -Patient is diabetic with a qualifying condition for at risk foot care. -Ulcer appears healed.  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 Instrumentation: Nail nipper, rotary burr. Number of Nails: 10   Procedure: Paring of Lesion Rationale: painful hyperkeratotic lesion Type of Debridement: manual, sharp debridement. Instrumentation: 312 blade Number of Lesions: 1  Return in about 9 weeks (around 07/04/2020) for Diabetic Foot Care.

## 2020-05-03 DIAGNOSIS — Z01818 Encounter for other preprocedural examination: Secondary | ICD-10-CM | POA: Diagnosis not present

## 2020-05-03 DIAGNOSIS — E113592 Type 2 diabetes mellitus with proliferative diabetic retinopathy without macular edema, left eye: Secondary | ICD-10-CM | POA: Diagnosis not present

## 2020-05-03 DIAGNOSIS — H4312 Vitreous hemorrhage, left eye: Secondary | ICD-10-CM | POA: Diagnosis not present

## 2020-05-05 ENCOUNTER — Other Ambulatory Visit: Payer: Self-pay | Admitting: Cardiovascular Disease

## 2020-05-30 DIAGNOSIS — Z23 Encounter for immunization: Secondary | ICD-10-CM | POA: Diagnosis not present

## 2020-06-06 ENCOUNTER — Other Ambulatory Visit: Payer: Self-pay | Admitting: Cardiovascular Disease

## 2020-06-08 DIAGNOSIS — B9689 Other specified bacterial agents as the cause of diseases classified elsewhere: Secondary | ICD-10-CM | POA: Diagnosis not present

## 2020-06-08 DIAGNOSIS — R110 Nausea: Secondary | ICD-10-CM | POA: Diagnosis not present

## 2020-06-08 DIAGNOSIS — J208 Acute bronchitis due to other specified organisms: Secondary | ICD-10-CM | POA: Diagnosis not present

## 2020-06-09 DIAGNOSIS — J1282 Pneumonia due to coronavirus disease 2019: Secondary | ICD-10-CM | POA: Diagnosis present

## 2020-06-09 DIAGNOSIS — A419 Sepsis, unspecified organism: Secondary | ICD-10-CM | POA: Diagnosis not present

## 2020-06-09 DIAGNOSIS — I480 Paroxysmal atrial fibrillation: Secondary | ICD-10-CM | POA: Diagnosis not present

## 2020-06-09 DIAGNOSIS — N183 Chronic kidney disease, stage 3 unspecified: Secondary | ICD-10-CM | POA: Diagnosis not present

## 2020-06-09 DIAGNOSIS — E785 Hyperlipidemia, unspecified: Secondary | ICD-10-CM | POA: Diagnosis not present

## 2020-06-09 DIAGNOSIS — I517 Cardiomegaly: Secondary | ICD-10-CM | POA: Diagnosis not present

## 2020-06-09 DIAGNOSIS — I63439 Cerebral infarction due to embolism of unspecified posterior cerebral artery: Secondary | ICD-10-CM | POA: Diagnosis not present

## 2020-06-09 DIAGNOSIS — E669 Obesity, unspecified: Secondary | ICD-10-CM | POA: Diagnosis not present

## 2020-06-09 DIAGNOSIS — N184 Chronic kidney disease, stage 4 (severe): Secondary | ICD-10-CM | POA: Diagnosis not present

## 2020-06-09 DIAGNOSIS — Z885 Allergy status to narcotic agent status: Secondary | ICD-10-CM | POA: Diagnosis not present

## 2020-06-09 DIAGNOSIS — I340 Nonrheumatic mitral (valve) insufficiency: Secondary | ICD-10-CM | POA: Diagnosis not present

## 2020-06-09 DIAGNOSIS — R1110 Vomiting, unspecified: Secondary | ICD-10-CM | POA: Diagnosis not present

## 2020-06-09 DIAGNOSIS — I9589 Other hypotension: Secondary | ICD-10-CM | POA: Diagnosis not present

## 2020-06-09 DIAGNOSIS — U071 COVID-19: Secondary | ICD-10-CM | POA: Diagnosis not present

## 2020-06-09 DIAGNOSIS — I6340 Cerebral infarction due to embolism of unspecified cerebral artery: Secondary | ICD-10-CM | POA: Diagnosis not present

## 2020-06-09 DIAGNOSIS — J80 Acute respiratory distress syndrome: Secondary | ICD-10-CM | POA: Diagnosis not present

## 2020-06-09 DIAGNOSIS — I2510 Atherosclerotic heart disease of native coronary artery without angina pectoris: Secondary | ICD-10-CM | POA: Diagnosis present

## 2020-06-09 DIAGNOSIS — I351 Nonrheumatic aortic (valve) insufficiency: Secondary | ICD-10-CM | POA: Diagnosis not present

## 2020-06-09 DIAGNOSIS — Z79899 Other long term (current) drug therapy: Secondary | ICD-10-CM | POA: Diagnosis not present

## 2020-06-09 DIAGNOSIS — Z91041 Radiographic dye allergy status: Secondary | ICD-10-CM | POA: Diagnosis not present

## 2020-06-09 DIAGNOSIS — D62 Acute posthemorrhagic anemia: Secondary | ICD-10-CM | POA: Diagnosis not present

## 2020-06-09 DIAGNOSIS — R0602 Shortness of breath: Secondary | ICD-10-CM | POA: Diagnosis not present

## 2020-06-09 DIAGNOSIS — Z66 Do not resuscitate: Secondary | ICD-10-CM | POA: Diagnosis not present

## 2020-06-09 DIAGNOSIS — R410 Disorientation, unspecified: Secondary | ICD-10-CM | POA: Diagnosis not present

## 2020-06-09 DIAGNOSIS — E039 Hypothyroidism, unspecified: Secondary | ICD-10-CM | POA: Diagnosis present

## 2020-06-09 DIAGNOSIS — N170 Acute kidney failure with tubular necrosis: Secondary | ICD-10-CM | POA: Diagnosis not present

## 2020-06-09 DIAGNOSIS — I482 Chronic atrial fibrillation, unspecified: Secondary | ICD-10-CM | POA: Diagnosis not present

## 2020-06-09 DIAGNOSIS — J189 Pneumonia, unspecified organism: Secondary | ICD-10-CM | POA: Diagnosis not present

## 2020-06-09 DIAGNOSIS — E861 Hypovolemia: Secondary | ICD-10-CM | POA: Diagnosis not present

## 2020-06-09 DIAGNOSIS — K922 Gastrointestinal hemorrhage, unspecified: Secondary | ICD-10-CM | POA: Diagnosis not present

## 2020-06-09 DIAGNOSIS — I361 Nonrheumatic tricuspid (valve) insufficiency: Secondary | ICD-10-CM | POA: Diagnosis not present

## 2020-06-09 DIAGNOSIS — D6959 Other secondary thrombocytopenia: Secondary | ICD-10-CM | POA: Diagnosis not present

## 2020-06-09 DIAGNOSIS — Z515 Encounter for palliative care: Secondary | ICD-10-CM | POA: Diagnosis not present

## 2020-06-09 DIAGNOSIS — J9601 Acute respiratory failure with hypoxia: Secondary | ICD-10-CM | POA: Diagnosis not present

## 2020-06-09 DIAGNOSIS — N289 Disorder of kidney and ureter, unspecified: Secondary | ICD-10-CM | POA: Diagnosis not present

## 2020-06-09 DIAGNOSIS — Z794 Long term (current) use of insulin: Secondary | ICD-10-CM | POA: Diagnosis not present

## 2020-06-09 DIAGNOSIS — I6622 Occlusion and stenosis of left posterior cerebral artery: Secondary | ICD-10-CM | POA: Diagnosis not present

## 2020-06-09 DIAGNOSIS — A4189 Other specified sepsis: Secondary | ICD-10-CM | POA: Diagnosis not present

## 2020-06-09 DIAGNOSIS — I6389 Other cerebral infarction: Secondary | ICD-10-CM | POA: Diagnosis not present

## 2020-06-09 DIAGNOSIS — R110 Nausea: Secondary | ICD-10-CM | POA: Diagnosis not present

## 2020-06-09 DIAGNOSIS — N179 Acute kidney failure, unspecified: Secondary | ICD-10-CM | POA: Diagnosis not present

## 2020-06-09 DIAGNOSIS — R1310 Dysphagia, unspecified: Secondary | ICD-10-CM | POA: Diagnosis not present

## 2020-06-09 DIAGNOSIS — Z888 Allergy status to other drugs, medicaments and biological substances status: Secondary | ICD-10-CM | POA: Diagnosis not present

## 2020-06-09 DIAGNOSIS — Z7189 Other specified counseling: Secondary | ICD-10-CM | POA: Diagnosis not present

## 2020-06-09 DIAGNOSIS — E7800 Pure hypercholesterolemia, unspecified: Secondary | ICD-10-CM | POA: Diagnosis present

## 2020-06-09 DIAGNOSIS — I639 Cerebral infarction, unspecified: Secondary | ICD-10-CM | POA: Diagnosis not present

## 2020-06-09 DIAGNOSIS — I10 Essential (primary) hypertension: Secondary | ICD-10-CM | POA: Diagnosis present

## 2020-06-09 DIAGNOSIS — Z7902 Long term (current) use of antithrombotics/antiplatelets: Secondary | ICD-10-CM | POA: Diagnosis not present

## 2020-06-09 DIAGNOSIS — I130 Hypertensive heart and chronic kidney disease with heart failure and stage 1 through stage 4 chronic kidney disease, or unspecified chronic kidney disease: Secondary | ICD-10-CM | POA: Diagnosis not present

## 2020-06-09 DIAGNOSIS — Z951 Presence of aortocoronary bypass graft: Secondary | ICD-10-CM | POA: Diagnosis not present

## 2020-06-09 DIAGNOSIS — R4182 Altered mental status, unspecified: Secondary | ICD-10-CM | POA: Diagnosis not present

## 2020-06-09 DIAGNOSIS — G9340 Encephalopathy, unspecified: Secondary | ICD-10-CM | POA: Diagnosis not present

## 2020-06-09 DIAGNOSIS — Z7901 Long term (current) use of anticoagulants: Secondary | ICD-10-CM | POA: Diagnosis not present

## 2020-06-09 DIAGNOSIS — J811 Chronic pulmonary edema: Secondary | ICD-10-CM | POA: Diagnosis not present

## 2020-06-09 DIAGNOSIS — J9600 Acute respiratory failure, unspecified whether with hypoxia or hypercapnia: Secondary | ICD-10-CM | POA: Diagnosis not present

## 2020-06-09 DIAGNOSIS — I429 Cardiomyopathy, unspecified: Secondary | ICD-10-CM | POA: Diagnosis not present

## 2020-06-09 DIAGNOSIS — E870 Hyperosmolality and hypernatremia: Secondary | ICD-10-CM | POA: Diagnosis not present

## 2020-06-09 DIAGNOSIS — Z6828 Body mass index (BMI) 28.0-28.9, adult: Secondary | ICD-10-CM | POA: Diagnosis not present

## 2020-06-09 DIAGNOSIS — Z4659 Encounter for fitting and adjustment of other gastrointestinal appliance and device: Secondary | ICD-10-CM | POA: Diagnosis not present

## 2020-06-09 DIAGNOSIS — I5022 Chronic systolic (congestive) heart failure: Secondary | ICD-10-CM | POA: Diagnosis not present

## 2020-06-09 DIAGNOSIS — I493 Ventricular premature depolarization: Secondary | ICD-10-CM | POA: Diagnosis not present

## 2020-06-14 ENCOUNTER — Inpatient Hospital Stay (HOSPITAL_COMMUNITY)
Admission: EM | Admit: 2020-06-14 | Discharge: 2020-07-06 | DRG: 208 | Disposition: E | Payer: Medicare Other | Source: Other Acute Inpatient Hospital | Attending: Neurology | Admitting: Neurology

## 2020-06-14 ENCOUNTER — Inpatient Hospital Stay (HOSPITAL_COMMUNITY): Payer: Medicare Other

## 2020-06-14 DIAGNOSIS — Z7189 Other specified counseling: Secondary | ICD-10-CM

## 2020-06-14 DIAGNOSIS — R1310 Dysphagia, unspecified: Secondary | ICD-10-CM | POA: Diagnosis present

## 2020-06-14 DIAGNOSIS — I2510 Atherosclerotic heart disease of native coronary artery without angina pectoris: Secondary | ICD-10-CM | POA: Diagnosis present

## 2020-06-14 DIAGNOSIS — J80 Acute respiratory distress syndrome: Secondary | ICD-10-CM | POA: Diagnosis present

## 2020-06-14 DIAGNOSIS — J90 Pleural effusion, not elsewhere classified: Secondary | ICD-10-CM | POA: Diagnosis not present

## 2020-06-14 DIAGNOSIS — N184 Chronic kidney disease, stage 4 (severe): Secondary | ICD-10-CM | POA: Diagnosis present

## 2020-06-14 DIAGNOSIS — R195 Other fecal abnormalities: Secondary | ICD-10-CM | POA: Diagnosis not present

## 2020-06-14 DIAGNOSIS — Z888 Allergy status to other drugs, medicaments and biological substances status: Secondary | ICD-10-CM

## 2020-06-14 DIAGNOSIS — I340 Nonrheumatic mitral (valve) insufficiency: Secondary | ICD-10-CM | POA: Diagnosis not present

## 2020-06-14 DIAGNOSIS — Z809 Family history of malignant neoplasm, unspecified: Secondary | ICD-10-CM

## 2020-06-14 DIAGNOSIS — Z7984 Long term (current) use of oral hypoglycemic drugs: Secondary | ICD-10-CM

## 2020-06-14 DIAGNOSIS — I429 Cardiomyopathy, unspecified: Secondary | ICD-10-CM | POA: Diagnosis present

## 2020-06-14 DIAGNOSIS — R571 Hypovolemic shock: Secondary | ICD-10-CM | POA: Diagnosis not present

## 2020-06-14 DIAGNOSIS — J1282 Pneumonia due to coronavirus disease 2019: Secondary | ICD-10-CM | POA: Diagnosis present

## 2020-06-14 DIAGNOSIS — I5022 Chronic systolic (congestive) heart failure: Secondary | ICD-10-CM | POA: Diagnosis present

## 2020-06-14 DIAGNOSIS — J9690 Respiratory failure, unspecified, unspecified whether with hypoxia or hypercapnia: Secondary | ICD-10-CM | POA: Diagnosis not present

## 2020-06-14 DIAGNOSIS — N179 Acute kidney failure, unspecified: Secondary | ICD-10-CM | POA: Diagnosis present

## 2020-06-14 DIAGNOSIS — I480 Paroxysmal atrial fibrillation: Secondary | ICD-10-CM | POA: Diagnosis present

## 2020-06-14 DIAGNOSIS — Z66 Do not resuscitate: Secondary | ICD-10-CM | POA: Diagnosis present

## 2020-06-14 DIAGNOSIS — K922 Gastrointestinal hemorrhage, unspecified: Secondary | ICD-10-CM | POA: Diagnosis not present

## 2020-06-14 DIAGNOSIS — I6340 Cerebral infarction due to embolism of unspecified cerebral artery: Secondary | ICD-10-CM | POA: Diagnosis not present

## 2020-06-14 DIAGNOSIS — D62 Acute posthemorrhagic anemia: Secondary | ICD-10-CM | POA: Diagnosis not present

## 2020-06-14 DIAGNOSIS — Z7901 Long term (current) use of anticoagulants: Secondary | ICD-10-CM

## 2020-06-14 DIAGNOSIS — L89301 Pressure ulcer of unspecified buttock, stage 1: Secondary | ICD-10-CM | POA: Diagnosis present

## 2020-06-14 DIAGNOSIS — N183 Chronic kidney disease, stage 3 unspecified: Secondary | ICD-10-CM | POA: Diagnosis present

## 2020-06-14 DIAGNOSIS — E1140 Type 2 diabetes mellitus with diabetic neuropathy, unspecified: Secondary | ICD-10-CM | POA: Diagnosis present

## 2020-06-14 DIAGNOSIS — Z452 Encounter for adjustment and management of vascular access device: Secondary | ICD-10-CM

## 2020-06-14 DIAGNOSIS — L8990 Pressure ulcer of unspecified site, unspecified stage: Secondary | ICD-10-CM | POA: Insufficient documentation

## 2020-06-14 DIAGNOSIS — I671 Cerebral aneurysm, nonruptured: Secondary | ICD-10-CM | POA: Diagnosis not present

## 2020-06-14 DIAGNOSIS — Z794 Long term (current) use of insulin: Secondary | ICD-10-CM

## 2020-06-14 DIAGNOSIS — E669 Obesity, unspecified: Secondary | ICD-10-CM | POA: Diagnosis present

## 2020-06-14 DIAGNOSIS — E870 Hyperosmolality and hypernatremia: Secondary | ICD-10-CM | POA: Diagnosis present

## 2020-06-14 DIAGNOSIS — Z833 Family history of diabetes mellitus: Secondary | ICD-10-CM

## 2020-06-14 DIAGNOSIS — Z955 Presence of coronary angioplasty implant and graft: Secondary | ICD-10-CM

## 2020-06-14 DIAGNOSIS — Z4659 Encounter for fitting and adjustment of other gastrointestinal appliance and device: Secondary | ICD-10-CM | POA: Diagnosis not present

## 2020-06-14 DIAGNOSIS — Z6828 Body mass index (BMI) 28.0-28.9, adult: Secondary | ICD-10-CM | POA: Diagnosis not present

## 2020-06-14 DIAGNOSIS — I63439 Cerebral infarction due to embolism of unspecified posterior cerebral artery: Secondary | ICD-10-CM | POA: Diagnosis present

## 2020-06-14 DIAGNOSIS — I482 Chronic atrial fibrillation, unspecified: Secondary | ICD-10-CM | POA: Diagnosis not present

## 2020-06-14 DIAGNOSIS — Z91041 Radiographic dye allergy status: Secondary | ICD-10-CM

## 2020-06-14 DIAGNOSIS — U071 COVID-19: Principal | ICD-10-CM

## 2020-06-14 DIAGNOSIS — I6389 Other cerebral infarction: Secondary | ICD-10-CM | POA: Diagnosis not present

## 2020-06-14 DIAGNOSIS — R4182 Altered mental status, unspecified: Secondary | ICD-10-CM | POA: Diagnosis not present

## 2020-06-14 DIAGNOSIS — I351 Nonrheumatic aortic (valve) insufficiency: Secondary | ICD-10-CM | POA: Diagnosis not present

## 2020-06-14 DIAGNOSIS — E785 Hyperlipidemia, unspecified: Secondary | ICD-10-CM | POA: Diagnosis present

## 2020-06-14 DIAGNOSIS — Z79899 Other long term (current) drug therapy: Secondary | ICD-10-CM

## 2020-06-14 DIAGNOSIS — Z515 Encounter for palliative care: Secondary | ICD-10-CM

## 2020-06-14 DIAGNOSIS — H748X3 Other specified disorders of middle ear and mastoid, bilateral: Secondary | ICD-10-CM | POA: Diagnosis not present

## 2020-06-14 DIAGNOSIS — I9589 Other hypotension: Secondary | ICD-10-CM | POA: Diagnosis not present

## 2020-06-14 DIAGNOSIS — G319 Degenerative disease of nervous system, unspecified: Secondary | ICD-10-CM | POA: Diagnosis not present

## 2020-06-14 DIAGNOSIS — Z7902 Long term (current) use of antithrombotics/antiplatelets: Secondary | ICD-10-CM

## 2020-06-14 DIAGNOSIS — E1165 Type 2 diabetes mellitus with hyperglycemia: Secondary | ICD-10-CM | POA: Diagnosis present

## 2020-06-14 DIAGNOSIS — E861 Hypovolemia: Secondary | ICD-10-CM | POA: Diagnosis not present

## 2020-06-14 DIAGNOSIS — Z4682 Encounter for fitting and adjustment of non-vascular catheter: Secondary | ICD-10-CM | POA: Diagnosis not present

## 2020-06-14 DIAGNOSIS — Z951 Presence of aortocoronary bypass graft: Secondary | ICD-10-CM

## 2020-06-14 DIAGNOSIS — I639 Cerebral infarction, unspecified: Secondary | ICD-10-CM | POA: Diagnosis not present

## 2020-06-14 DIAGNOSIS — I130 Hypertensive heart and chronic kidney disease with heart failure and stage 1 through stage 4 chronic kidney disease, or unspecified chronic kidney disease: Secondary | ICD-10-CM | POA: Diagnosis present

## 2020-06-14 DIAGNOSIS — I493 Ventricular premature depolarization: Secondary | ICD-10-CM | POA: Diagnosis present

## 2020-06-14 DIAGNOSIS — Z978 Presence of other specified devices: Secondary | ICD-10-CM

## 2020-06-14 DIAGNOSIS — I361 Nonrheumatic tricuspid (valve) insufficiency: Secondary | ICD-10-CM | POA: Diagnosis not present

## 2020-06-14 DIAGNOSIS — I672 Cerebral atherosclerosis: Secondary | ICD-10-CM | POA: Diagnosis not present

## 2020-06-14 DIAGNOSIS — E039 Hypothyroidism, unspecified: Secondary | ICD-10-CM | POA: Diagnosis present

## 2020-06-14 DIAGNOSIS — J9600 Acute respiratory failure, unspecified whether with hypoxia or hypercapnia: Secondary | ICD-10-CM | POA: Diagnosis not present

## 2020-06-14 DIAGNOSIS — J9601 Acute respiratory failure with hypoxia: Secondary | ICD-10-CM | POA: Diagnosis not present

## 2020-06-14 DIAGNOSIS — G9340 Encephalopathy, unspecified: Secondary | ICD-10-CM

## 2020-06-14 DIAGNOSIS — I6622 Occlusion and stenosis of left posterior cerebral artery: Secondary | ICD-10-CM | POA: Diagnosis not present

## 2020-06-14 DIAGNOSIS — E1122 Type 2 diabetes mellitus with diabetic chronic kidney disease: Secondary | ICD-10-CM | POA: Diagnosis present

## 2020-06-14 DIAGNOSIS — Z885 Allergy status to narcotic agent status: Secondary | ICD-10-CM

## 2020-06-14 DIAGNOSIS — E875 Hyperkalemia: Secondary | ICD-10-CM | POA: Diagnosis not present

## 2020-06-14 LAB — GLUCOSE, CAPILLARY: Glucose-Capillary: 204 mg/dL — ABNORMAL HIGH (ref 70–99)

## 2020-06-14 IMAGING — MR MR HEAD W/O CM
11 series · 48 of 48 positions shown · non-contrast
Comparison: None.

CLINICAL DATA: Stroke follow-up

EXAM:
MRI HEAD WITHOUT CONTRAST
TECHNIQUE: Multiplanar, multiecho pulse sequences of the brain and surrounding
structures were obtained without intravenous contrast.

[Series 5: DWI · axial · 3.0mm · 0.88mm/px · z∈[-42,+92]mm · 11 of 100 slices shown (1 of 4)]
[im 1/100]
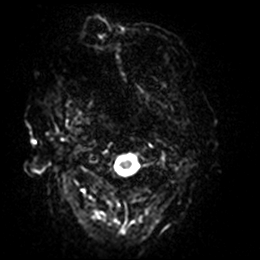
[im 10/100]
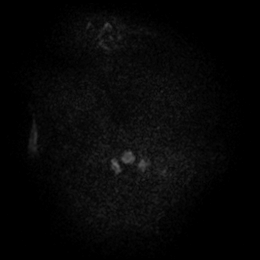
[im 20/100]
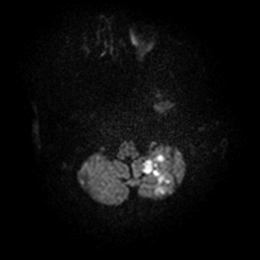
[im 30/100]
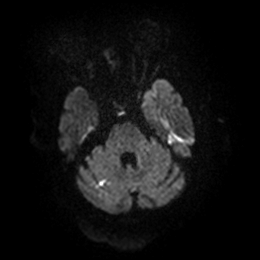
[im 40/100]
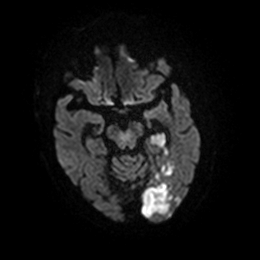
[im 50/100]
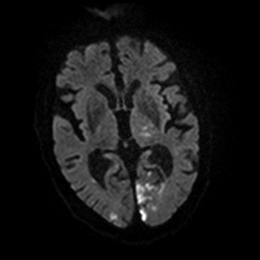
[im 60/100]
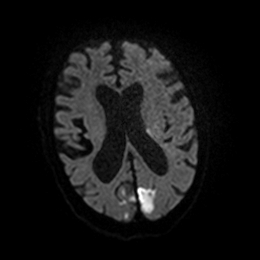
[im 70/100]
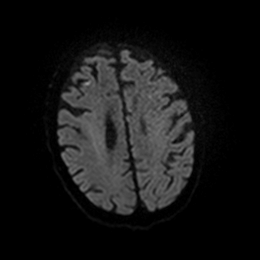
[im 80/100]
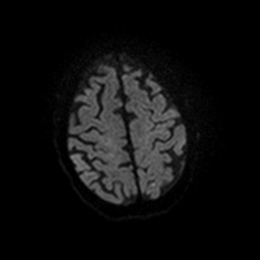
[im 90/100]
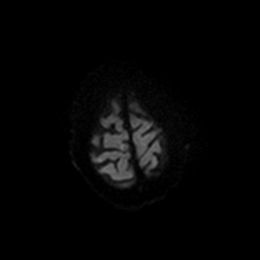
[im 100/100]
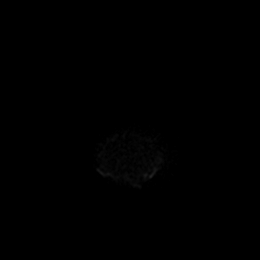

[Series 6: DWI · axial · 3.0mm · 0.88mm/px · z∈[-42,+92]mm · 5 of 50 slices shown (2 of 4)]
[im 1/50]
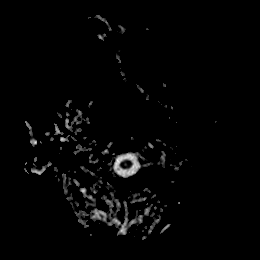
[im 13/50]
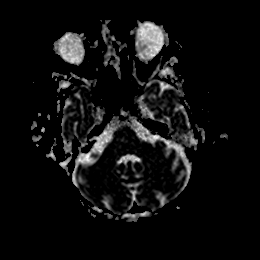
[im 25/50]
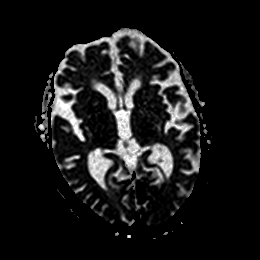
[im 37/50]
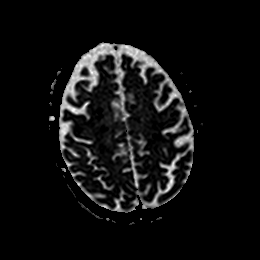
[im 50/50]
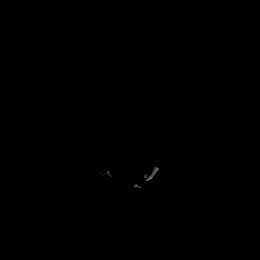

[Series 7: DWI · coronal · 4.0mm · 0.88mm/px · 7 of 74 slices shown (3 of 4)]
[im 1/74]
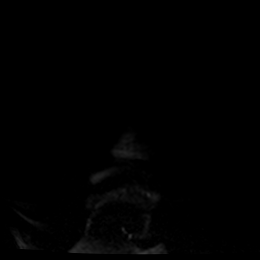
[im 13/74]
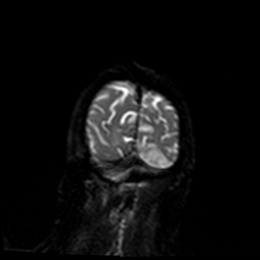
[im 25/74]
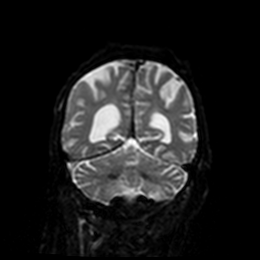
[im 37/74]
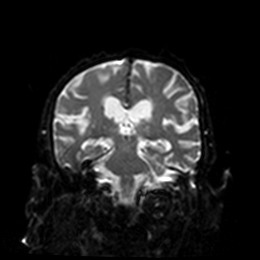
[im 49/74]
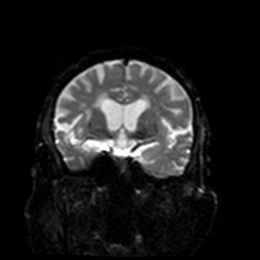
[im 61/74]
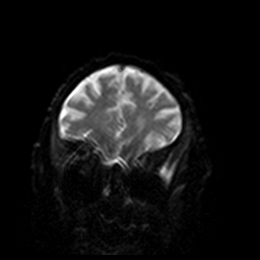
[im 74/74]
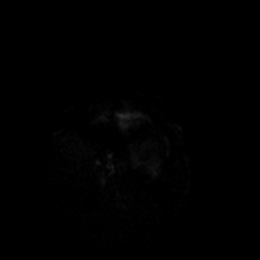

[Series 8: DWI · coronal · 4.0mm · 0.88mm/px · 4 of 37 slices shown (4 of 4)]
[im 1/37]
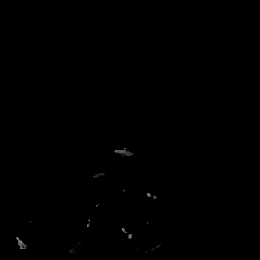
[im 13/37]
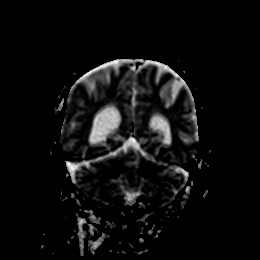
[im 25/37]
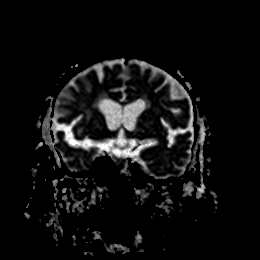
[im 37/37]
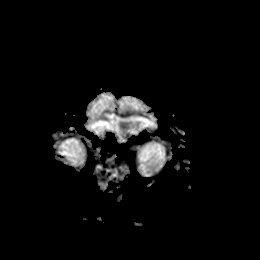

[Series 11: pha_images · axial · 3.0mm · 0.90mm/px · z∈[-45,+105]mm · 5 of 54 slices shown]
[im 1/54]
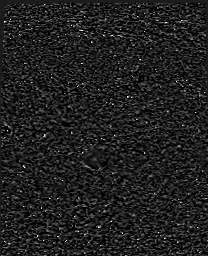
[im 14/54]
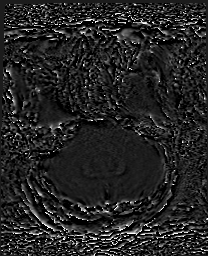
[im 27/54]
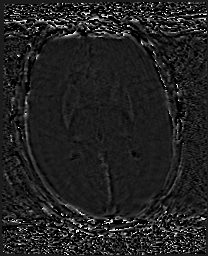
[im 40/54]
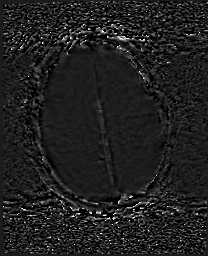
[im 54/54]
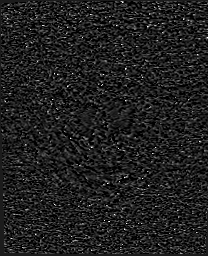

[Series 12: swi_images · axial · 3.0mm · 0.90mm/px · z∈[-45,+105]mm · 5 of 56 slices shown]
[im 1/56]
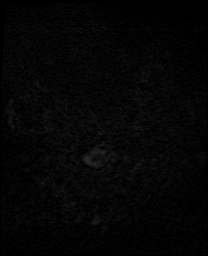
[im 14/56]
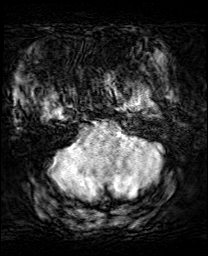
[im 28/56]
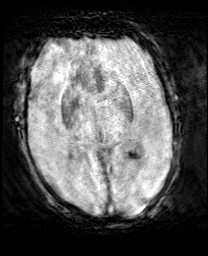
[im 42/56]
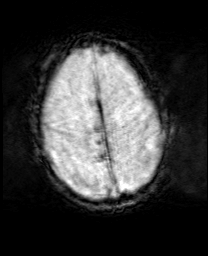
[im 56/56]
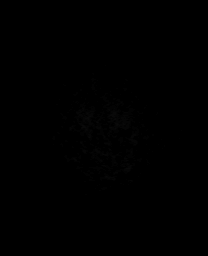

[Series 14: T1 · sagittal · 5.0mm · 0.75mm/px · 2 of 25 slices shown]
[im 1/25]
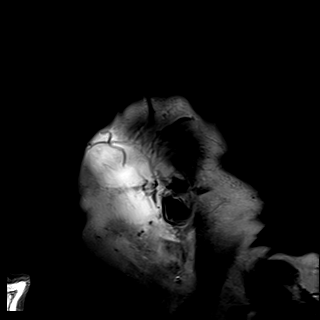
[im 25/25]
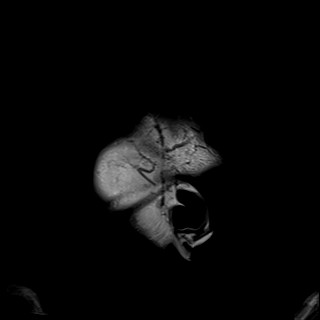

[Series 15: T2 · axial · 5.0mm · 0.72mm/px · z∈[-39,+92]mm · 2 of 25 slices shown (1 of 2)]
[im 1/25]
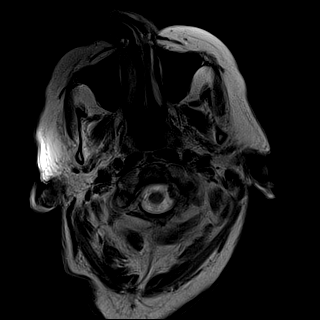
[im 25/25]
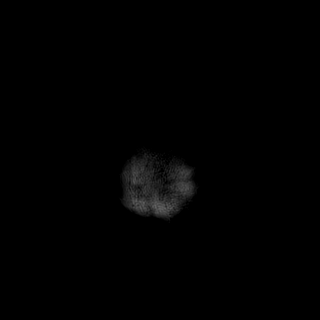

[Series 16: FLAIR · axial · 5.0mm · 0.90mm/px · z∈[-44,+87]mm · 2 of 25 slices shown]
[im 1/25]
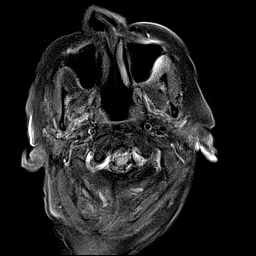
[im 25/25]
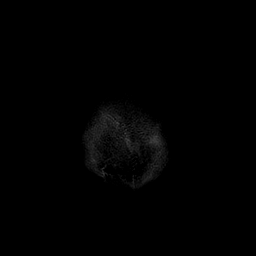

[Series 17: ax hemo · axial · 5.0mm · 0.86mm/px · z∈[-44,+87]mm · 2 of 25 slices shown]
[im 1/25]
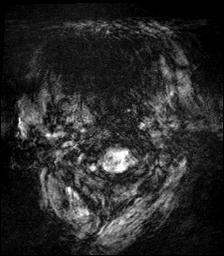
[im 25/25]
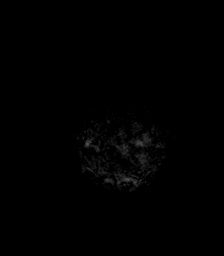

[Series 18: T2 · coronal · 5.0mm · 0.90mm/px · 3 of 30 slices shown (2 of 2)]
[im 1/30]
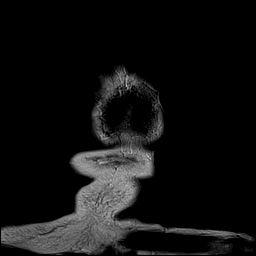
[im 15/30]
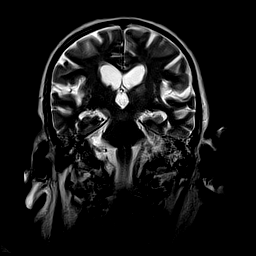
[im 30/30]
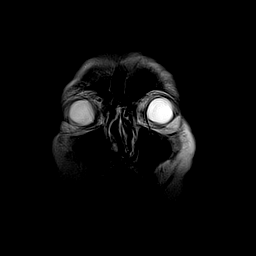

[48 of 48 positions shown; findings below may reference images not displayed]

FINDINGS: Brain: There is multifocal acute ischemia throughout both
hemispheres, worst in the left PCA territory and left PICA
territory. There is multifocal periventricular white matter
hyperintensity, most often a result of chronic microvascular
ischemia. There is generalized atrophy without lobar predilection.

Vascular: Normal flow voids.

Skull and upper cervical spine: Normal marrow signal.

Sinuses/Orbits: Negative.

Other: None.
IMPRESSION: Multifocal acute ischemia throughout both hemispheres, worst in the
left PCA territory and left PICA territory. No hemorrhage or mass
effect.

## 2020-06-14 IMAGING — DX DG CHEST 1V PORT
1 series · 1 of 1 positions shown · non-contrast
Comparison: [DATE]

CLINICAL DATA: COVID pneumonia

EXAM:
PORTABLE CHEST 1 VIEW

[chest ap]
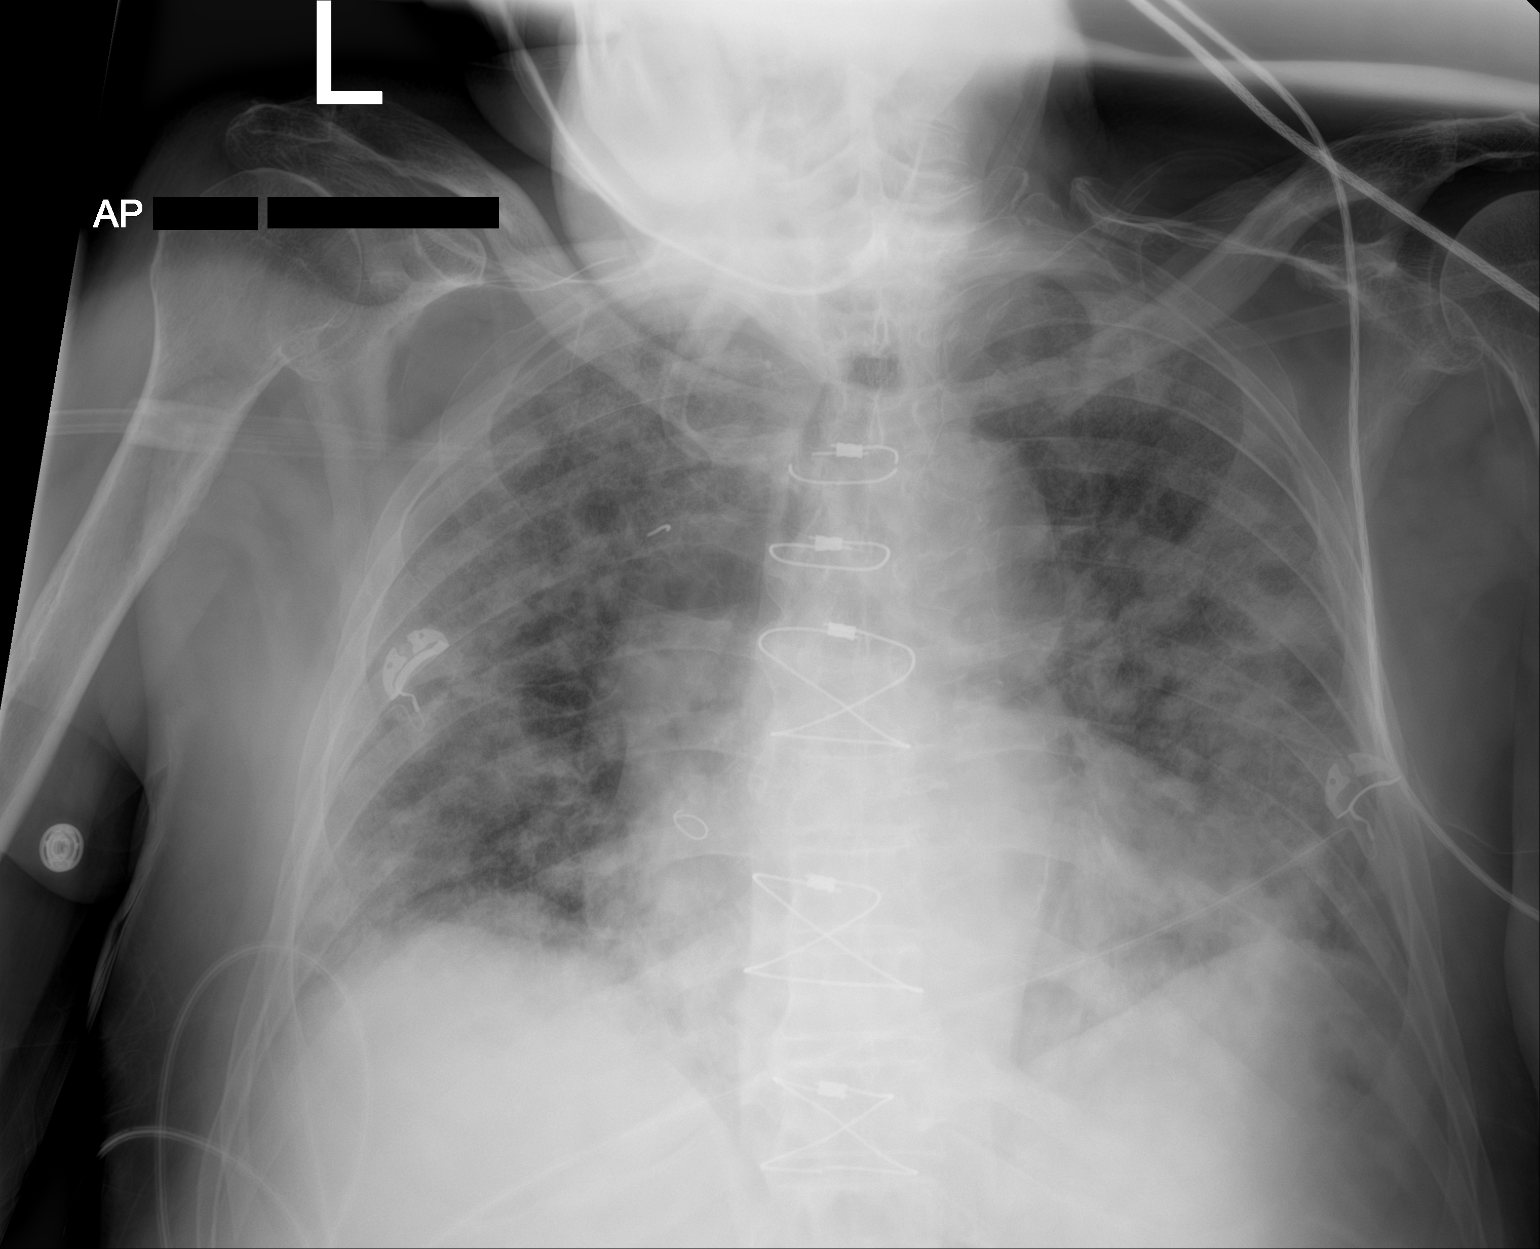

[1 of 1 positions shown; findings below may reference images not displayed]

FINDINGS: There are persistent diffuse hazy bilateral airspace opacities which
have improved from prior study. There is cardiomegaly. The patient
is status post prior CABG. There is no pneumothorax. No large
pleural effusion. The superior most sternotomy wire is again
fractured.
IMPRESSION: Improving bilateral multifocal airspace opacities.

## 2020-06-14 MED ORDER — ACETAMINOPHEN 325 MG PO TABS: 650.0000 mg | ORAL_TABLET | ORAL | Status: DC | PRN

## 2020-06-14 MED ORDER — ASPIRIN 325 MG PO TABS
325.0000 mg | ORAL_TABLET | Freq: Every day | ORAL | Status: DC
  Administered 2020-06-15 – 2020-06-16 (×2): 325 mg via ORAL
  Filled 2020-06-14 (×2): qty 1

## 2020-06-14 MED ORDER — ENOXAPARIN SODIUM 40 MG/0.4ML ~~LOC~~ SOLN
40.0000 mg | SUBCUTANEOUS | Status: DC
  Administered 2020-06-15: 40 mg via SUBCUTANEOUS
  Filled 2020-06-14: qty 0.4

## 2020-06-14 MED ORDER — ACETAMINOPHEN 650 MG RE SUPP: 650.0000 mg | RECTAL | Status: DC | PRN

## 2020-06-14 MED ORDER — ASPIRIN 300 MG RE SUPP: 300.0000 mg | Freq: Every day | RECTAL | Status: DC

## 2020-06-14 MED ORDER — STROKE: EARLY STAGES OF RECOVERY BOOK
Freq: Once | Status: DC
  Filled 2020-06-14: qty 1

## 2020-06-14 MED ORDER — SODIUM CHLORIDE 0.9 % IV SOLN: INTRAVENOUS | Status: DC

## 2020-06-14 MED ORDER — ACETAMINOPHEN 160 MG/5ML PO SOLN: 650.0000 mg | ORAL | Status: DC | PRN

## 2020-06-14 MED ORDER — SENNOSIDES-DOCUSATE SODIUM 8.6-50 MG PO TABS: 1.0000 | ORAL_TABLET | Freq: Every evening | ORAL | Status: DC | PRN

## 2020-06-14 NOTE — H&P (Addendum)
NEUROLOGY CONSULTATION NOTE   Date of service: June 14, 2020 Patient Name: Roberto Haley MRN:  [id number] DOB:  [birth year] Reason for consult: "admission"  History of Present Illness  Roberto Haley is a 79 y.o. male with PMH significant for DM2, CAD, HTN, HLD, PVC, obesity, prior SDH, atrial fibrillation on Eliquis who presents as a transfer from outside hospital for concern for potential basilar artery thrombosis/occlusion and left occipital and cerebellar hypodensity concerning for a large infarct.  Per notes from the outside hospital, patient presented with 1 week history of dyspnea and found to have Covid pneumonia.  He was fully vaccinated.  He was noted to have worsening confusion and potentially bilateral blindness on exam.  A CT head was obtained and demonstrated a left posterior infarct and hypodensity which was noted to be too large and focal to be an area of press.  There was concern for potential basilar occlusion given bilateral vision loss and confusion however patient has known iodinated contrast allergy and therefore vessel imaging with CT angio was not obtained.  The outside hospital was not equipped to obtained MR angios for Covid positive patients and therefore patient was transferred to [facility] for further workup.  Patient is confused and moaning and unable to provide any meaningful history at the time of my evaluation.  It seems like he was he was a nasal cannula at the outside hospital and maintaining saturation over 90%.  Per notes patient was full code and was treated aggressively with IV Decadron, IV remdesivir, baricitinib which was renally adjusted and COVID-19 vitamins. Hospitalization was complicated by increasing oxygen requirement on 06/10/2020 followed by worsening of the congestion on chest x-ray on 06/12/2020.  He was noted to have worsening confusion at the outside hospital and a CT head was obtained which demonstrated subacute infarct in the left  occipital lobe along with a possible infarct in the inferior left cerebellum.  He was not noted to have any hemorrhage or mass-effect.  He was continued on Eliquis and Plavix.  Hospitalization was also complicated by acute on chronic kidney injury.  He was hydrated with fluids and creatinine was noted to be stable between 1.9-2.2.    ROS   Unable to obtain due to review of systems due to altered mental status and confusion.  Patient was only noted to be moaning.  Past History   Past Medical History:  Diagnosis Date  . Arthritis   . Chronic kidney disease    CKD STAGE 3;  CHRONIC RENAL INSUFFICIENCY  . Coronary artery disease   . Diabetes mellitus   . Groin hematoma 02/19/12   RIGHT  . Hyperlipidemia   . Hypertension   . Neuropathy due to type 2 diabetes mellitus ([place])   . Obesity   . PVC (premature ventricular contraction)   . SDH (subdural hematoma) ([place]) 02/06/2013   R parafalcine SDH after a fall, rx at [facility]   Past Surgical History:  Procedure Laterality Date  . CARDIAC CATHETERIZATION  01/14/12   LV FXN EF 50-55% W/MILD DISTAL INFERIOR HYPOKINESIS; PCI: LAD PTCA/STENT W/NEW 2.75X22 RESOLUTE DES IN MID LAD POST DIALTED TO 3.08 TO 2.97 TAPER: 99%-80% TO 0; LCX: PTCA VIA LM STENT W/2.25X12 SPRINTER BALLOON: 90%-5; LAD: PATENT PROXIMAL STENT EXTENDING INTO LM W/30-40% SMOOTH NARROWING IN DISTAL PORTION OF STENT; 99% IN STENT RESTENOSIS OF MID LAD STENT   . CARDIAC CATHETERIZATION N/A 12/02/2015   Procedure: Left Heart Cath and Coronary Angiography;  Surgeon: Troy Sine, MD;  Location: [place] [facility];  Service: Cardiovascular;  Laterality: N/A;  . CARDIAC CATHETERIZATION N/A 12/02/2015   Procedure: Coronary Stent Intervention;  Surgeon: Troy Sine, MD;  Location: [place] [facility];  Service: Cardiovascular;  Laterality: N/A;  . CARDIOVERSION N/A 09/26/2018   Procedure: CARDIOVERSION;  Surgeon: Troy Sine, MD;  Location: [facility] ENDOSCOPY;  Service: Cardiovascular;   Laterality: N/A;  . CORONARY ARTERY BYPASS GRAFT  1997   LIMA to the LAD and vein to the RCA;  . CORONARY STENT PLACEMENT  12/02/2015   PAD  . DOPPLER ECHOCARDIOGRAPHY  01/09/12   LV NORMAL IN SIZE, NORMAL WAL THICKNESS, EF 50-55%; MILD POSTERIOR WALL HYOPKINESIS, THERE IS MILD INFERIOR WALL HYPOKINESIS  . HYPOTHENAR FAT PAD TRANSFER    . LEFT HEART CATHETERIZATION WITH CORONARY/GRAFT ANGIOGRAM N/A 01/14/2012   Procedure: LEFT HEART CATHETERIZATION WITH Beatrix Fetters;  Surgeon: Troy Sine, MD;  Location: [facility] CATH LAB;  Service: Cardiovascular;  Laterality: N/A;  . LOWER ARTERIAL DOPPLER  01/24/12   ESSENTIALLY NORMAL RIGHT GROIN DUPLEX EVALUATION S/P THROMBIN INJECTION  . LOWER VENOUS DUPLEX  02/05/12   ESSENTIALLY NORMAL RIGHT LOWER EXTRIMTY VENOUS DUPLEX DOPPLER EVALUATION  . NM MYOVIEW LTD  01/09/12   HIGH RISK SCAN. COMPARED TO PREVIOUS STUDY, THERE IS NOW ISCHEMIA PRESENT. ABNORMAL MYOCARDIAL PERFUSION STUDY. THERE IS NEW MILD INFEROLATERAL ISCHEMIA TOWARDS THE BASE; PT TO FOLLOW UP WITH DR. [name]  . PERCUTANEOUS CORONARY STENT INTERVENTION (PCI-S) N/A 01/14/2012   Procedure: PERCUTANEOUS CORONARY STENT INTERVENTION (PCI-S);  Surgeon: Troy Sine, MD;  Location: [facility] CATH LAB;  Service: Cardiovascular;  Laterality: N/A;   Family History  Problem Relation Age of Onset  . Cancer Father   . Diabetes Paternal Aunt   . Heart disease Neg Hx    Social History   Socioeconomic History  . Marital status: Married    Spouse name: Not on file  . Number of children: 1  . Years of education: Not on file  . Highest education level: Not on file  Occupational History  . Not on file  Tobacco Use  . Smoking status: Never Smoker  . Smokeless tobacco: Never Used  Substance and Sexual Activity  . Alcohol use: No    Alcohol/week: 0.0 standard drinks  . Drug use: No  . Sexual activity: Not on file  Other Topics Concern  . Not on file  Social History Narrative   COULD NOT FIND ANY INFO  ABOUT FAMILY HISTORY    Social Determinants of Health   Financial Resource Strain:   . Difficulty of Paying Living Expenses: Not on file  Food Insecurity:   . Worried About Charity fundraiser in the Last Year: Not on file  . Ran Out of Food in the Last Year: Not on file  Transportation Needs:   . Lack of Transportation (Medical): Not on file  . Lack of Transportation (Non-Medical): Not on file  Physical Activity:   . Days of Exercise per Week: Not on file  . Minutes of Exercise per Session: Not on file  Stress:   . Feeling of Stress : Not on file  Social Connections:   . Frequency of Communication with Friends and Family: Not on file  . Frequency of Social Gatherings with Friends and Family: Not on file  . Attends Religious Services: Not on file  . Active Member of Clubs or Organizations: Not on file  . Attends Archivist Meetings: Not on file  . Marital Status:  Not on file   Allergies  Allergen Reactions  . Diltiazem Other (See Comments)    Unknown  . Lovastatin Other (See Comments)    Unknown  . Proton Pump Inhibitors Other (See Comments)    Unknown   . Tramadol Nausea And Vomiting  . Codeine Rash    Medications   Medications Prior to Admission  Medication Sig Dispense Refill Last Dose  . atorvastatin (LIPITOR) 40 MG tablet TAKE 1 TABLET(40 MG) BY MOUTH DAILY 90 tablet 0   . acetaminophen (TYLENOL) 500 MG tablet Take 500 mg by mouth every 6 (six) hours as needed.     Marland Kitchen amiodarone (PACERONE) 200 MG tablet TAKE 1 TABLET(200 MG) BY MOUTH TWICE DAILY 180 tablet 2   . amLODipine (NORVASC) 5 MG tablet Take 1 tablet (5 mg total) by mouth daily. 90 tablet 3   . benazepril (LOTENSIN) 40 MG tablet Take 1 tablet by mouth daily.     . cholecalciferol (VITAMIN D) 1000 UNITS tablet Take 1,000 Units by mouth daily.     . clopidogrel (PLAVIX) 75 MG tablet TAKE 1 TABLET(75 MG) BY MOUTH DAILY WITH BREAKFAST 90 tablet 3   . DUREZOL 0.05 % EMUL      . ELIQUIS 5 MG TABS  tablet TAKE 1 TABLET(5 MG) BY MOUTH TWICE DAILY 180 tablet 2   . furosemide (LASIX) 40 MG tablet Take one tablet (40 mg) by mouth every morning and half a tablet (20 mg) by mouth EVERY afternoon. 135 tablet 6   . hydrALAZINE (APRESOLINE) 50 MG tablet TAKE 1 AND 1/2 TABLETS(75 MG) BY MOUTH TWICE DAILY 180 tablet 2   . insulin aspart (NOVOLOG) 100 UNIT/ML FlexPen Inject 14 Units into the skin 2 (two) times daily with a meal.      . isosorbide mononitrate (IMDUR) 60 MG 24 hr tablet TAKE 1 AND 1/2 TABLETS BY MOUTH EVERY AM AND TAKE 0.5 TABLET BY MOUTH EVERY EVENING 180 tablet 3   . ketoconazole (NIZORAL) 2 % cream Apply 1 fingertip amount to each foot daily. 30 g 0   . Lancets (ONETOUCH DELICA PLUS [id number]) MISC USE AS DIRECTED TO TEST BLOOD GLUCOSE BID     . LEVEMIR FLEXTOUCH 100 UNIT/ML Pen Inject 44 Units into the skin 2 (two) times daily.   12   . levothyroxine (SYNTHROID) 150 MCG tablet Take 150 mcg by mouth every morning.     Marland Kitchen LINZESS 145 MCG CAPS capsule Take 145 mcg by mouth daily before breakfast.   11   . metFORMIN (GLUCOPHAGE-XR) 750 MG 24 hr tablet Take 750 mg by mouth daily with breakfast.     . metoprolol succinate (TOPROL-XL) 50 MG 24 hr tablet TAKE 1 TABLET(50 MG) BY MOUTH DAILY 90 tablet 1   . Multiple Vitamin (MULTIVITAMIN WITH MINERALS) TABS Take 1 tablet by mouth daily.     . nitroGLYCERIN (NITROSTAT) 0.4 MG SL tablet Place 1 tablet (0.4 mg total) under the tongue every 5 (five) minutes as needed. For chest pain 25 tablet 12   . NOVOFINE AUTOCOVER 30G X 8 MM MISC      . ofloxacin (OCUFLOX) 0.3 % ophthalmic solution      . ONE TOUCH ULTRA TEST test strip   4   . oxybutynin (DITROPAN) 5 MG tablet Take 1 tablet by mouth daily.     . potassium chloride SA (KLOR-CON) 20 MEQ tablet TAKE 1 TABLET BY MOUTH DAILY AS NEEDED 90 tablet 1   . TRULICITY 1.5 IW/5.8KD SOPN  Inject 1.5 mg as directed once a week.   12   . vitamin C (ASCORBIC ACID) 500 MG tablet Take 500 mg by mouth daily.         Vitals   Vitals:   06/13/2020 1900 06/26/2020 1915  BP: (!) 153/120 (!) 169/73  Pulse: (!) 105 (!) 106  Resp: (!) 24 (!) 25  Temp: 97.9 F (36.6 C)   TempSrc: Axillary   SpO2: 99% 99%     There is no height or weight on file to calculate BMI.  Physical Exam   General: Laying comfortably in bed; meaning. HENT: Normal oropharynx and mucosa. Normal external appearance of ears and nose. Neck: Supple, no pain or tenderness CV: No JVD. No peripheral edema. Pulmonary: Symmetric Chest rise. Tachypneic and wheezing. Abdomen: Soft to touch, non-tender. Ext: No cyanosis, edema, or deformity Skin: No rash. Normal palpation of skin.  Musculoskeletal: Normal digits and nails by inspection. No clubbing.  Neurologic Examination  Mental status/Cognition: Opens eyes to loud voice, does not follow any commands.  Does briefly track my face on the left but not on the right. Speech/language: Noted to be moaning but does not produce any meaningful words. Cranial nerves:   CN II Left pupil is 72mm, R pupil is 35mm, both pupils are not reactive to bright light. Keeps his eyes tight shut making it somewhat difficult to assess. Does not seem to be blinking to threat to the right visual field.   CN III,IV,VI Unable to assess EOM, no gaze preference noted.   CN V    CN VII Symmetric facial grimace to nares stimulation.   CN VIII    CN IX & X    CN XI    CN XII    Motor:  Muscle bulk: normal, tone decreased in RUE  Unable to do a detailed motor exam but noted to be able to hold LUE off the bed. RUE falls to the bed immediately, some withdrawal noted to nailbed pressure in RUE.  Withdraws BL lower extremities to babinskis and seems to purposefully pull them away.  Reflexes:  Right Left Comments  Pectoralis      Biceps (C5/6) 2 2   Brachioradialis (C5/6) 2 2    Triceps (C6/7) 2 2    Patellar (L3/4) 2 2    Achilles (S1)      Hoffman      Plantar     Jaw jerk    Sensation: Voices to mild  nailbed pressure in all extremities.  Coordination/Complex Motor:  Unable to assess.  Labs   CBC: No results for input(s): WBC, NEUTROABS, HGB, HCT, MCV, PLT in the last 168 hours.  Basic Metabolic Panel:  Lab Results  Component Value Date   NA 135 03/13/2019   K 4.7 03/13/2019   CO2 26 03/13/2019   GLUCOSE 174 (H) 03/13/2019   BUN 27 03/13/2019   CREATININE 1.87 (H) 03/13/2019   CALCIUM 9.2 03/13/2019   GFRNONAA 34 (L) 03/13/2019   GFRAA 39 (L) 03/13/2019   Lipid Panel:  Lab Results  Component Value Date   LDLCALC 47 06/02/2018   HgbA1c:  Lab Results  Component Value Date   HGBA1C 10.7 (A) 11/01/2014   Urine Drug Screen: No results found for: LABOPIA, COCAINSCRNUR, LABBENZ, AMPHETMU, THCU, LABBARB  Alcohol Level No results found for: [facility]  CTH without contrast: Did not personally review but per report from OSH: Low-density in the left occipital lobe suggesting acute to subacute infarction.  Possible infarction  in inferior left cerebellum.  No hemorrhage or mass-effect.  Chronic small vessel ischemic changes elsewhere throughout the brain.  Impression   Roberto Haley is a 79 y.o. male with PMH significant for DM2, CAD, HTN, HLD, obesity, prior SDH, atrial fibrillation on Eliquis who presents as a transfer from outside hospital for concern for potential basilar artery thrombosis/occlusion and left occipital and cerebellar hypodensity concerning for a left PCA stroke in the setting of COVID Pneumonia. His neurologic examination is notable for confusion and moaning with unreactive pupils BL with R pupil larger than left. Not blinking to threat on the right, concern for motor weakness in the RUE. CTH at OSH with L occipital hypodensity concerning for acute to subacute infarction with ? additional infarct in the L cerebellum. Transferred for MRI Brain and vessel imaging to rule out basilar artery thrombosis.  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 - Stroke education booklet - PT/OT/SLP consult   Acute respiratory failure due to Covid Pneumonia: Has had increasing oxygenation requirement over the last 2 days at OSH. Will get CXR and assistance from PCCM. - Appreciate assistance from Pulm/CCM team. - Steroids, duonebs, baricitinib, lasix per pulm  Hypothyroidism: - continue home Levothyroxine 134mcg daily  HTN: - Hold off on home medications given acute stroke - gradual normotension after the first 24 hours. - Goal SBP as above  DM2: - SSI - NPO for now, will need carb correction when tolerating PO  PAfibb: - continue Amiodarone - Will discuss timing of Xarelto based on MRI Brain.  Code status: - Full code per documentation from OSH. Discussed with daughter who would like him to be full code at this time. ______________________________________________________________________  This patient is critically ill and at significant risk of neurological worsening, death and care requires constant monitoring of vital signs, hemodynamics,respiratory and cardiac monitoring, neurological assessment, discussion with family, other specialists and medical decision making of high complexity. I spent 70 minutes of neurocritical care time  in the care of  this patient. This was time spent independent of any time provided by nurse practitioner or PA.  Donnetta Simpers [facility] Pager Number  [phone] 06/15/2020  2:35 AM  Thank you for the opportunity to take part in the care of this patient. If you have any further questions, please contact the neurology consultation attending.  Signed,  [place] Pager Number [phone]

## 2020-06-15 ENCOUNTER — Inpatient Hospital Stay (HOSPITAL_COMMUNITY): Payer: Medicare Other

## 2020-06-15 DIAGNOSIS — L8990 Pressure ulcer of unspecified site, unspecified stage: Secondary | ICD-10-CM | POA: Insufficient documentation

## 2020-06-15 DIAGNOSIS — I639 Cerebral infarction, unspecified: Secondary | ICD-10-CM

## 2020-06-15 DIAGNOSIS — I351 Nonrheumatic aortic (valve) insufficiency: Secondary | ICD-10-CM | POA: Diagnosis not present

## 2020-06-15 DIAGNOSIS — I6389 Other cerebral infarction: Secondary | ICD-10-CM

## 2020-06-15 DIAGNOSIS — G9340 Encephalopathy, unspecified: Secondary | ICD-10-CM

## 2020-06-15 DIAGNOSIS — I340 Nonrheumatic mitral (valve) insufficiency: Secondary | ICD-10-CM

## 2020-06-15 DIAGNOSIS — I361 Nonrheumatic tricuspid (valve) insufficiency: Secondary | ICD-10-CM

## 2020-06-15 DIAGNOSIS — J9601 Acute respiratory failure with hypoxia: Secondary | ICD-10-CM

## 2020-06-15 DIAGNOSIS — U071 COVID-19: Principal | ICD-10-CM

## 2020-06-15 LAB — LIPID PANEL
Cholesterol: 83 mg/dL (ref 0–200)
HDL: 28 mg/dL — ABNORMAL LOW (ref >40)
LDL Cholesterol: 42 mg/dL (ref 0–99)
Total CHOL/HDL Ratio: 3 RATIO
Triglycerides: 63 mg/dL (ref <150)
VLDL: 13 mg/dL (ref 0–40)

## 2020-06-15 LAB — CBC
HCT: 36.8 % — ABNORMAL LOW (ref 39.0–52.0)
Hemoglobin: 12 g/dL — ABNORMAL LOW (ref 13.0–17.0)
MCH: 29.2 pg (ref 26.0–34.0)
MCHC: 32.6 g/dL (ref 30.0–36.0)
MCV: 89.5 fL (ref 80.0–100.0)
Platelets: 229 10*3/uL (ref 150–400)
RBC: 4.11 MIL/uL — ABNORMAL LOW (ref 4.22–5.81)
RDW: 15.6 % — ABNORMAL HIGH (ref 11.5–15.5)
WBC: 19.4 10*3/uL — ABNORMAL HIGH (ref 4.0–10.5)
nRBC: 0.2 % (ref 0.0–0.2)

## 2020-06-15 LAB — POCT I-STAT 7, (LYTES, BLD GAS, ICA,H+H)
Acid-base deficit: 6 mmol/L — ABNORMAL HIGH (ref 0.0–2.0)
Acid-base deficit: 5 mmol/L — ABNORMAL HIGH (ref 0.0–2.0)
Bicarbonate: 18.9 mmol/L — ABNORMAL LOW (ref 20.0–28.0)
Bicarbonate: 20.9 mmol/L (ref 20.0–28.0)
Calcium, Ion: 1.19 mmol/L (ref 1.15–1.40)
Calcium, Ion: 1.2 mmol/L (ref 1.15–1.40)
HCT: 32 % — ABNORMAL LOW (ref 39.0–52.0)
HCT: 29 % — ABNORMAL LOW (ref 39.0–52.0)
Hemoglobin: 10.9 g/dL — ABNORMAL LOW (ref 13.0–17.0)
Hemoglobin: 9.9 g/dL — ABNORMAL LOW (ref 13.0–17.0)
O2 Saturation: 98 %
O2 Saturation: 100 %
Patient temperature: 97.8
Patient temperature: 98.6
Potassium: 4.6 mmol/L (ref 3.5–5.1)
Potassium: 4.5 mmol/L (ref 3.5–5.1)
Sodium: 143 mmol/L (ref 135–145)
Sodium: 143 mmol/L (ref 135–145)
TCO2: 20 mmol/L — ABNORMAL LOW (ref 22–32)
TCO2: 22 mmol/L (ref 22–32)
pCO2 arterial: 33.2 mmHg (ref 32.0–48.0)
pCO2 arterial: 39.1 mmHg (ref 32.0–48.0)
pH, Arterial: 7.361 (ref 7.350–7.450)
pH, Arterial: 7.335 — ABNORMAL LOW (ref 7.350–7.450)
pO2, Arterial: 109 mmHg — ABNORMAL HIGH (ref 83.0–108.0)
pO2, Arterial: 475 mmHg — ABNORMAL HIGH (ref 83.0–108.0)

## 2020-06-15 LAB — HEMOGLOBIN A1C
Hgb A1c MFr Bld: 7.7 % — ABNORMAL HIGH (ref 4.8–5.6)
Mean Plasma Glucose: 174.29 mg/dL

## 2020-06-15 LAB — ECHOCARDIOGRAM COMPLETE
Height: 68 in
P 1/2 time: 665 msec
S' Lateral: 4.2 cm
Weight: 3209.9 oz

## 2020-06-15 LAB — GLUCOSE, CAPILLARY
Glucose-Capillary: 116 mg/dL — ABNORMAL HIGH (ref 70–99)
Glucose-Capillary: 117 mg/dL — ABNORMAL HIGH (ref 70–99)
Glucose-Capillary: 148 mg/dL — ABNORMAL HIGH (ref 70–99)
Glucose-Capillary: 155 mg/dL — ABNORMAL HIGH (ref 70–99)
Glucose-Capillary: 191 mg/dL — ABNORMAL HIGH (ref 70–99)
Glucose-Capillary: 193 mg/dL — ABNORMAL HIGH (ref 70–99)
Glucose-Capillary: 220 mg/dL — ABNORMAL HIGH (ref 70–99)
Glucose-Capillary: 247 mg/dL — ABNORMAL HIGH (ref 70–99)

## 2020-06-15 LAB — COMPREHENSIVE METABOLIC PANEL
ALT: 40 U/L (ref 0–44)
AST: 64 U/L — ABNORMAL HIGH (ref 15–41)
Albumin: 2.3 g/dL — ABNORMAL LOW (ref 3.5–5.0)
Alkaline Phosphatase: 229 U/L — ABNORMAL HIGH (ref 38–126)
Anion gap: 15 (ref 5–15)
BUN: 61 mg/dL — ABNORMAL HIGH (ref 8–23)
CO2: 16 mmol/L — ABNORMAL LOW (ref 22–32)
Calcium: 8.5 mg/dL — ABNORMAL LOW (ref 8.9–10.3)
Chloride: 109 mmol/L (ref 98–111)
Creatinine, Ser: 2.29 mg/dL — ABNORMAL HIGH (ref 0.61–1.24)
GFR, Estimated: 29 mL/min — ABNORMAL LOW (ref >60)
Glucose, Bld: 213 mg/dL — ABNORMAL HIGH (ref 70–99)
Potassium: 4.6 mmol/L (ref 3.5–5.1)
Sodium: 140 mmol/L (ref 135–145)
Total Bilirubin: 1 mg/dL (ref 0.3–1.2)
Total Protein: 6.3 g/dL — ABNORMAL LOW (ref 6.5–8.1)

## 2020-06-15 LAB — PROCALCITONIN: Procalcitonin: 0.57 ng/mL

## 2020-06-15 LAB — MRSA PCR SCREENING: MRSA by PCR: NEGATIVE

## 2020-06-15 LAB — CREATININE, SERUM
Creatinine, Ser: 2.25 mg/dL — ABNORMAL HIGH (ref 0.61–1.24)
GFR, Estimated: 29 mL/min — ABNORMAL LOW (ref >60)

## 2020-06-15 IMAGING — DX DG ABD PORTABLE 1V
1 series · 2 of 2 positions shown · non-contrast
Comparison: [DATE].

CLINICAL DATA: Feeding tube placement.

EXAM:
PORTABLE ABDOMEN - 1 VIEW

[Series 1: abdomen kub · 0.14mm/px · 2 of 2 slices shown]
[im 1/2]
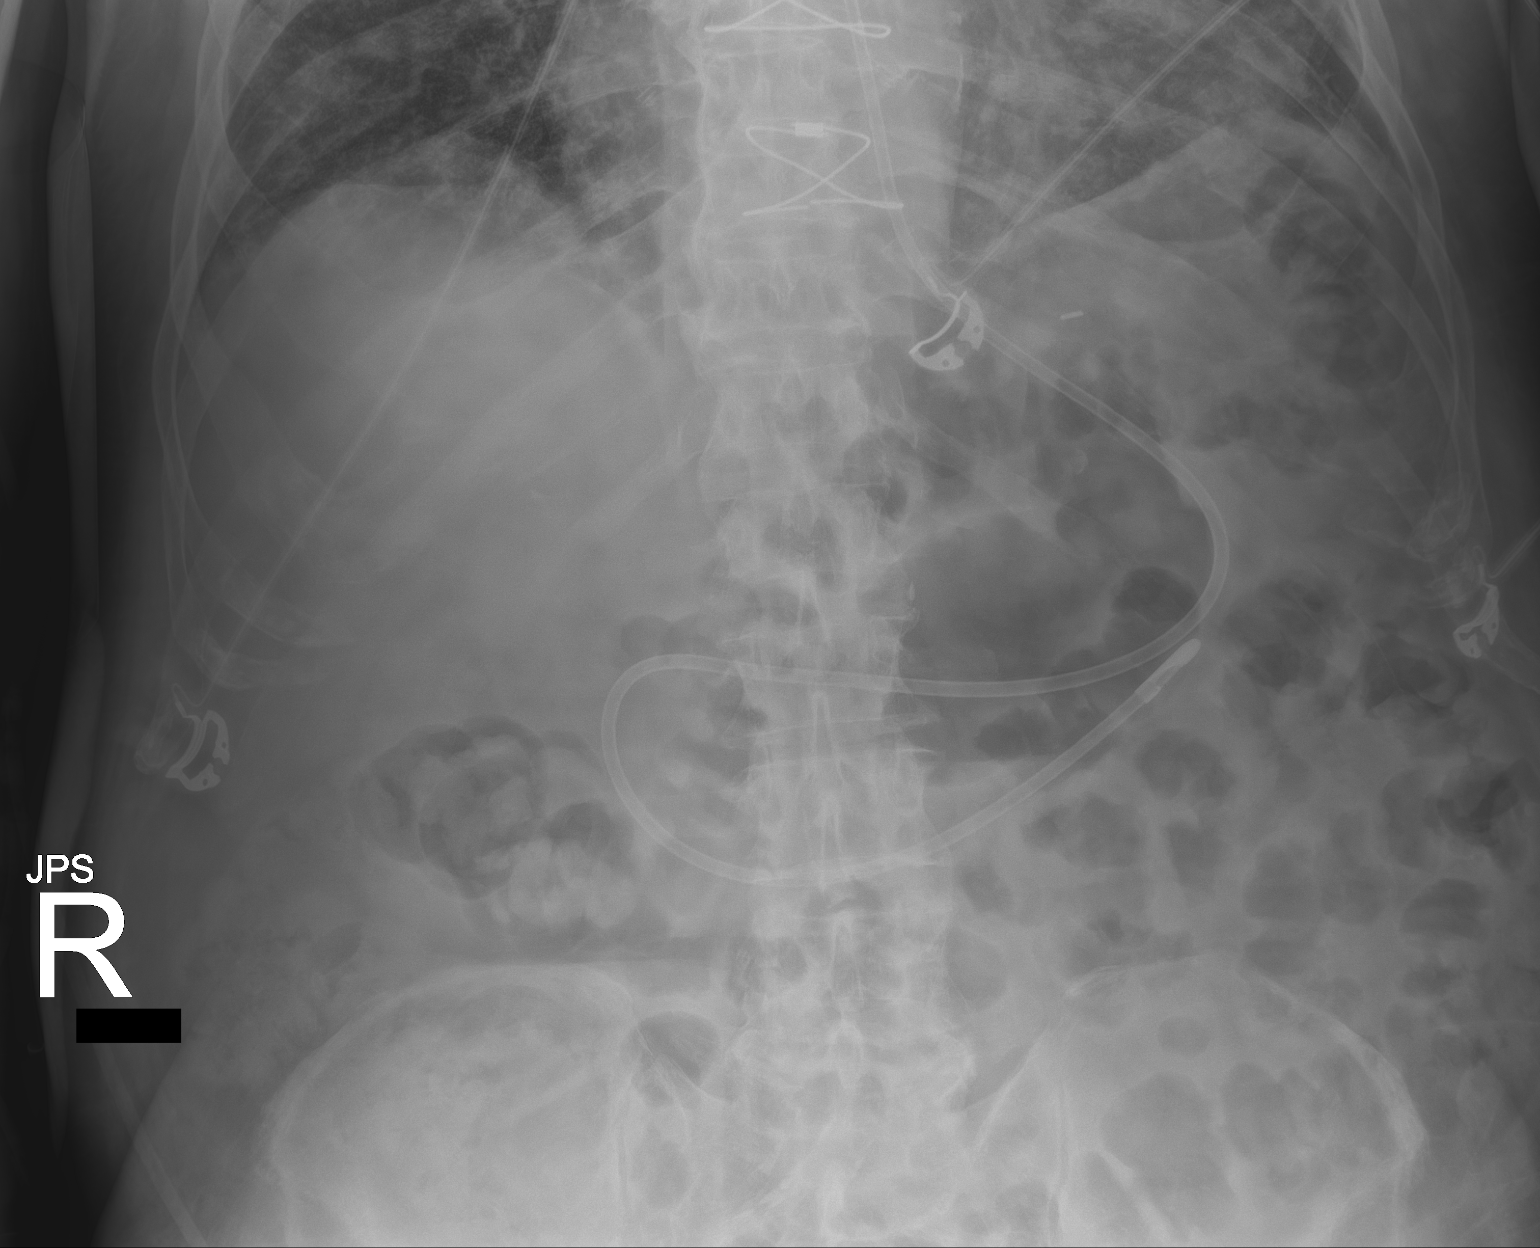
[im 2/2]
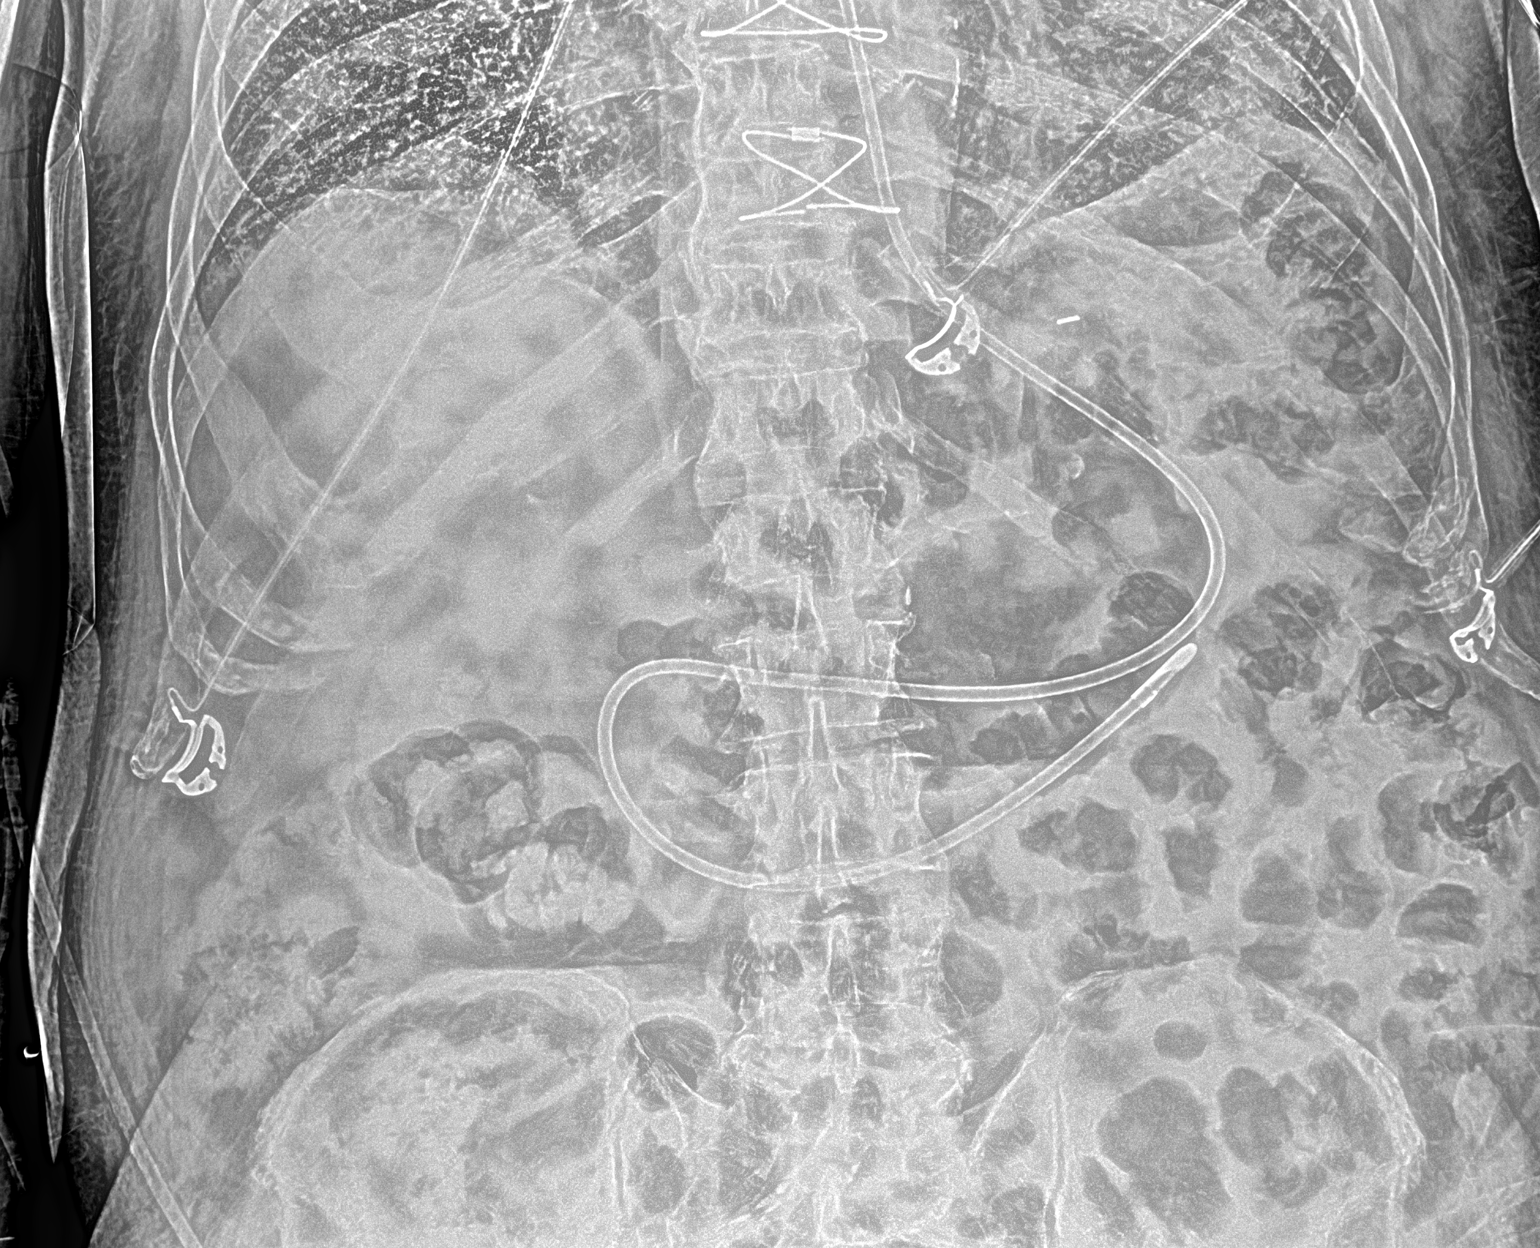

[2 of 2 positions shown; findings below may reference images not displayed]

FINDINGS: The bowel gas pattern is normal. Distal portion of feeding tube is
seen looped within the stomach. No radio-opaque calculi or other
significant radiographic abnormality are seen.
IMPRESSION: Distal portion of feeding tube seen looped within the stomach.

## 2020-06-15 IMAGING — MR MR MRA NECK W/O CM
1 of 6 series · 19 of 48 positions shown · non-contrast
Comparison: Noncontrast head CT [DATE], MRI/MRA head
[DATE].

CLINICAL DATA: Vertebral artery aneurysm.

EXAM:
MRA NECK WITHOUT CONTRAST
TECHNIQUE: Angiographic images of the neck were obtained using MRA technique
without intravenous contrast. Carotid stenosis measurements (when
applicable) are obtained utilizing NASCET criteria, using the distal
internal carotid diameter as the denominator.

[Series 3: sag inhance (id) · sagittal · 1.2mm · 0.47mm/px · 19 of 360 slices shown]
[im 1/360]
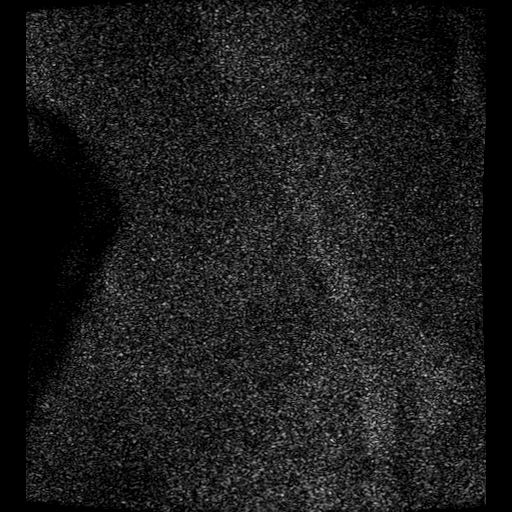
[im 13/360]
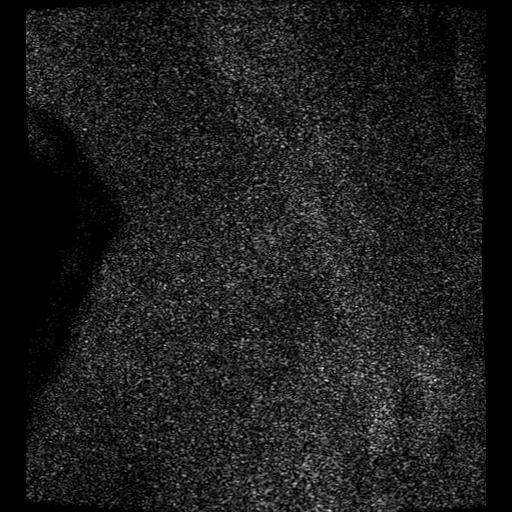
[im 26/360]
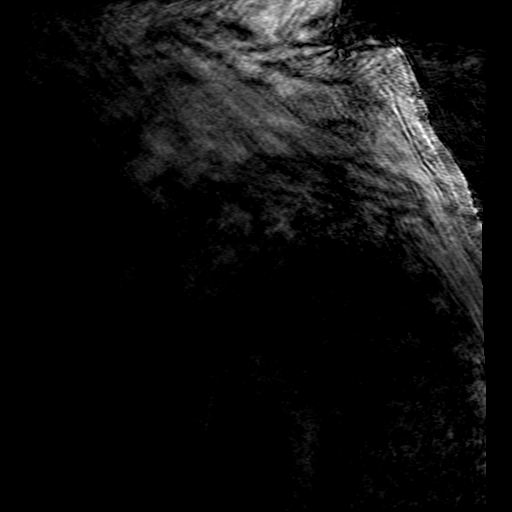
[im 39/360]
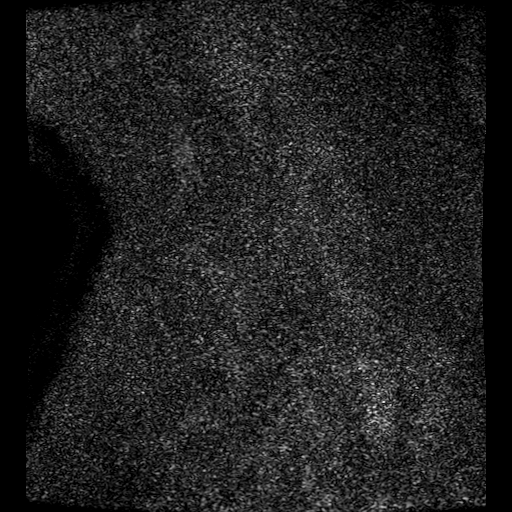
[im 52/360]
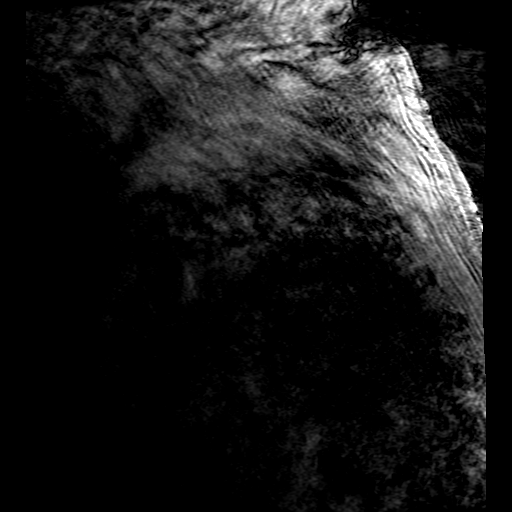
[im 65/360]
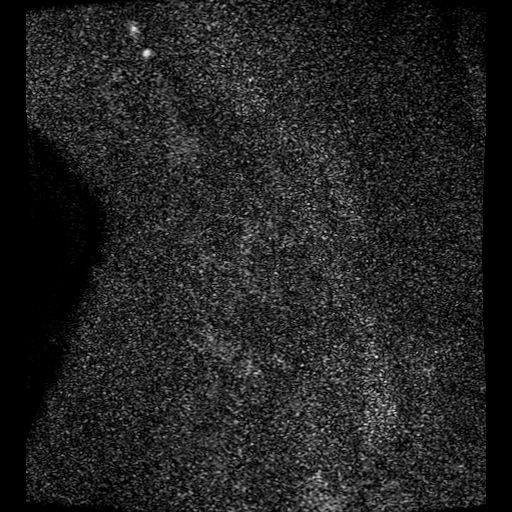
[im 77/360]
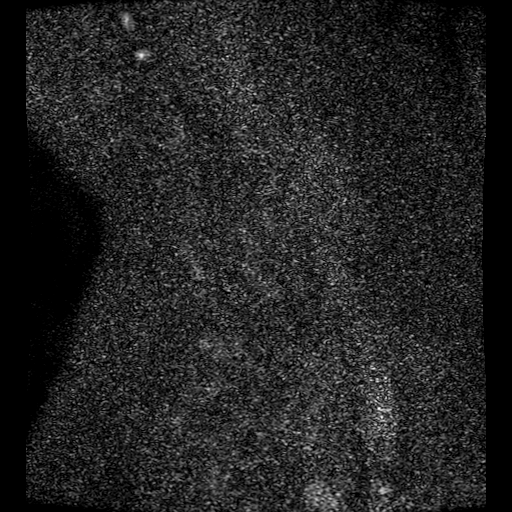
[im 90/360]
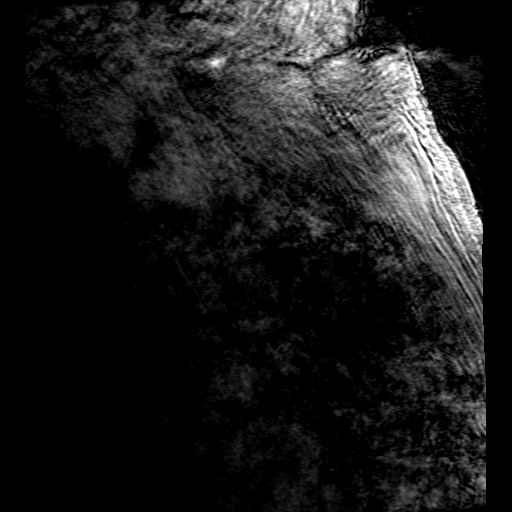
[im 103/360]
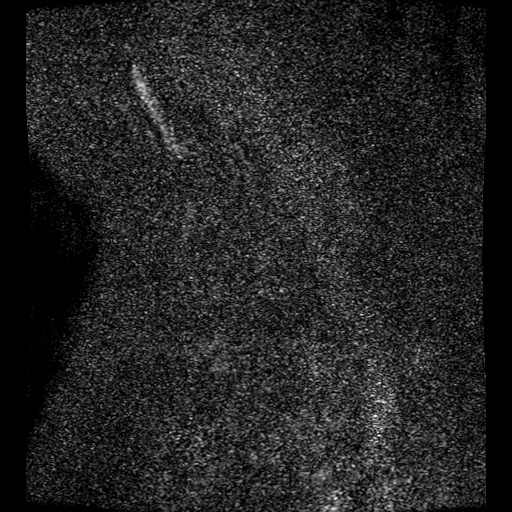
[im 116/360]
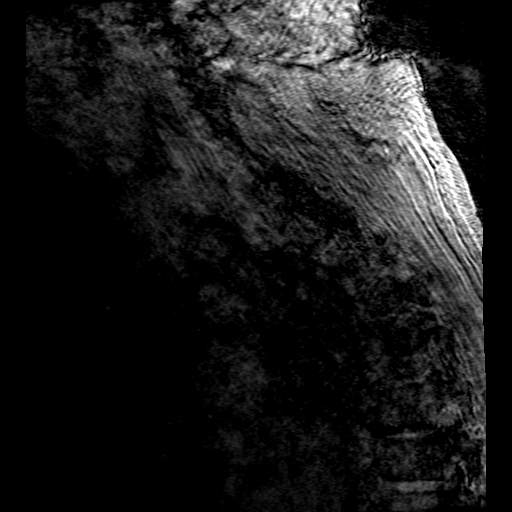
[im 129/360]
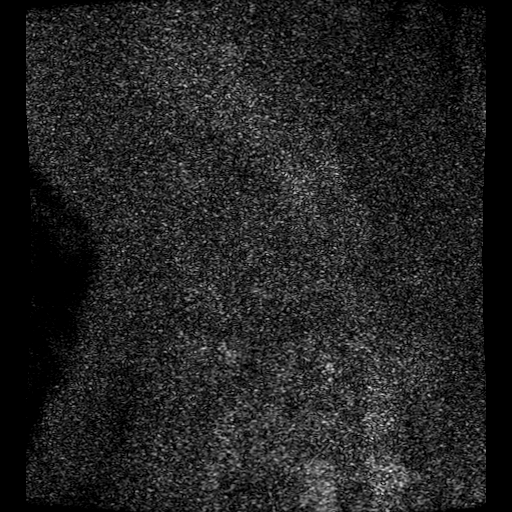
[im 142/360]
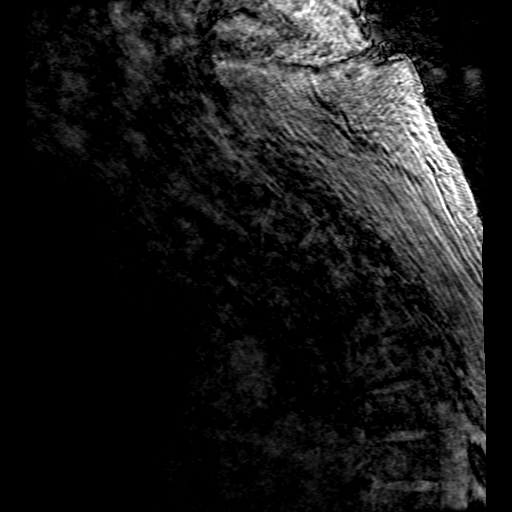
[im 154/360]
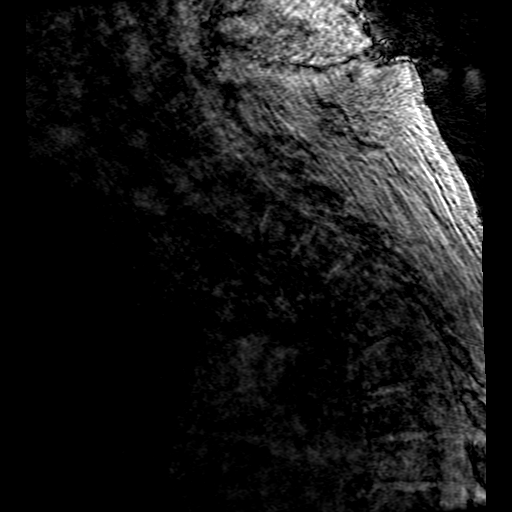
[im 180/360]
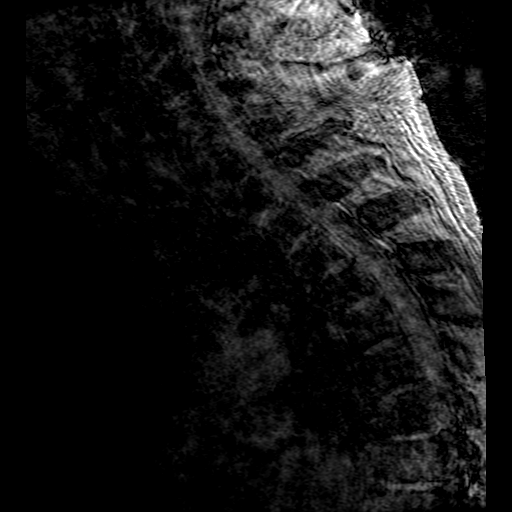
[im 206/360]
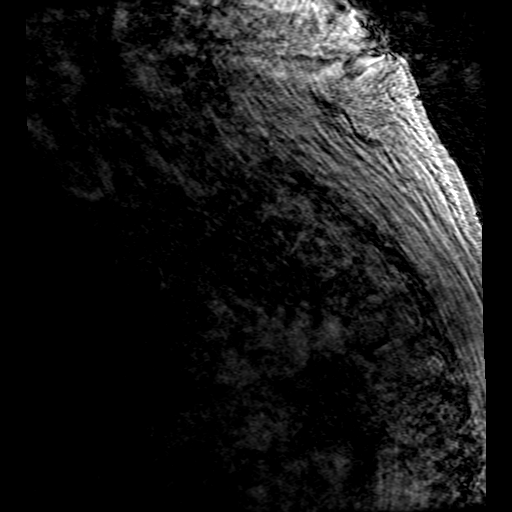
[im 244/360]
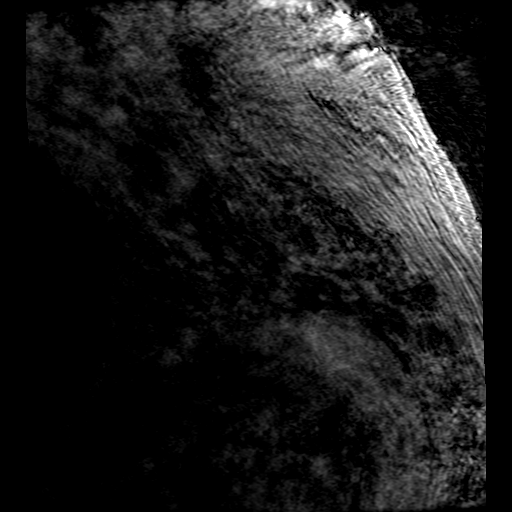
[im 295/360]
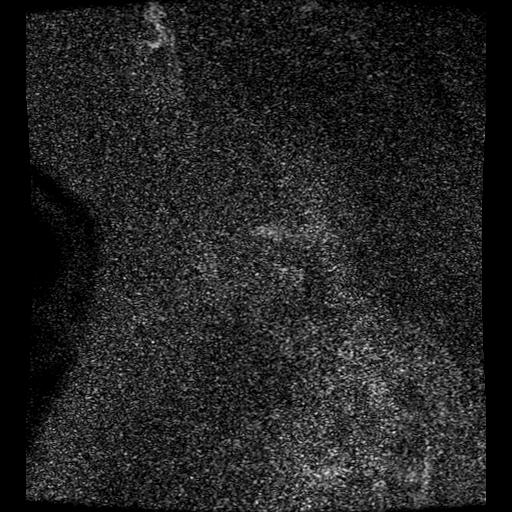
[im 308/360]
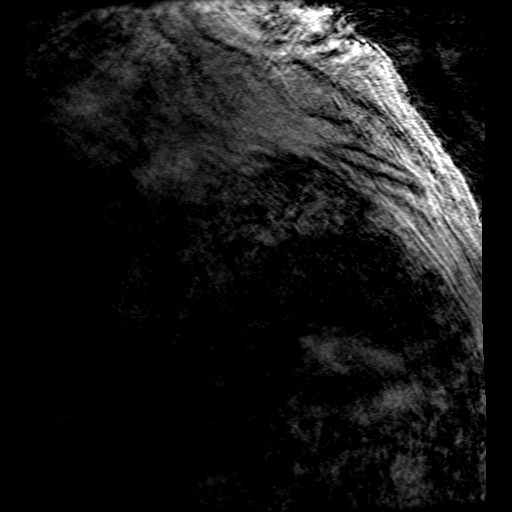
[im 347/360]
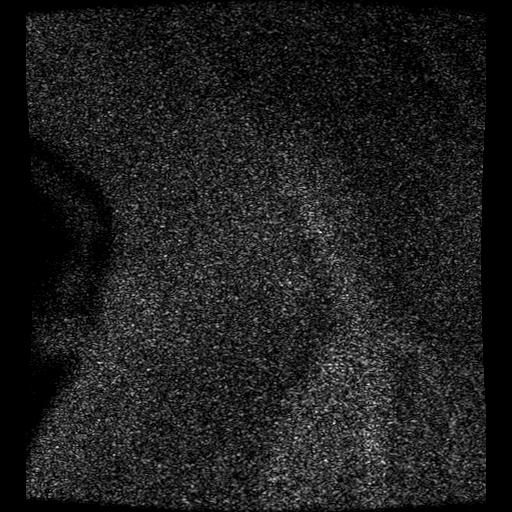

[19 of 48 positions shown; findings below may reference images not displayed]

FINDINGS: The examination is significantly motion degraded, precluding
adequate evaluation for stenoses and significantly limiting
evaluation for aneurysms.

The bilateral common carotid, internal carotid and vertebral
arteries are patent within the neck with antegrade flow.

No definite vertebral artery aneurysm is identified, although
evaluation is limited for reasons described.
IMPRESSION: The examination is significantly motion degraded, precluding
adequate evaluation for stenoses and significantly limiting
evaluation for aneurysms.

The bilateral common carotid, internal carotid and vertebral
arteries are patent within the neck with antegrade flow.

Within described limitations, no definite vertebral artery aneurysm
is identified.

## 2020-06-15 IMAGING — MR MR MRA HEAD W/O CM
3 of 4 series · 19 of 48 positions shown · non-contrast
Comparison: Brain MRI [DATE]

CLINICAL DATA: Posterior circulation infarcts

EXAM:
MRA HEAD WITHOUT CONTRAST
TECHNIQUE: Angiographic images of the Circle of Willis were obtained using MRA
technique without intravenous contrast.

[Series 2: ax (id) · axial · 1.0mm · 0.43mm/px · z∈[-105,-20]mm · 16 of 184 slices shown]
[im 1/184]
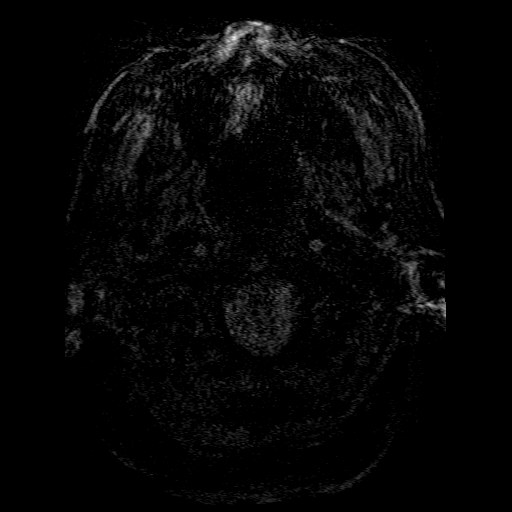
[im 9/184]
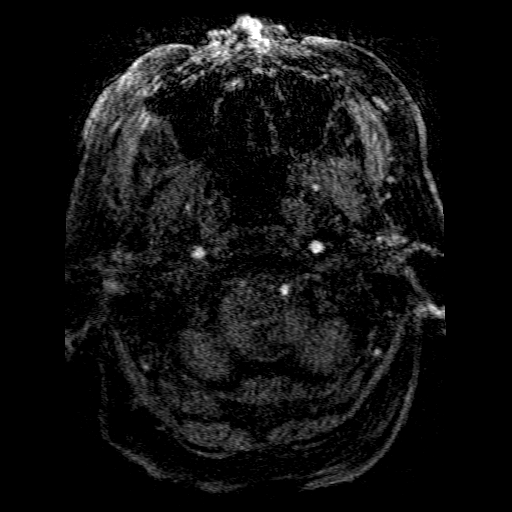
[im 17/184]
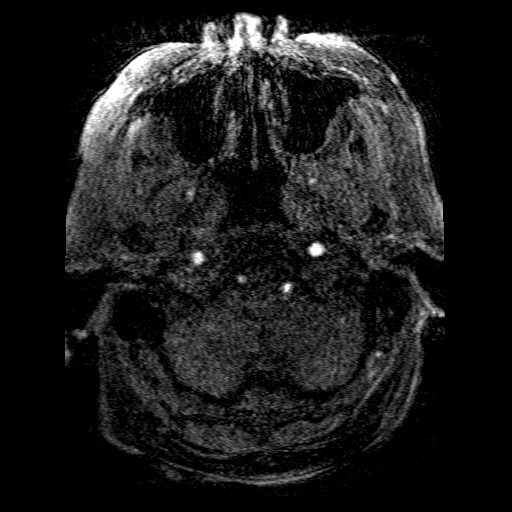
[im 25/184]
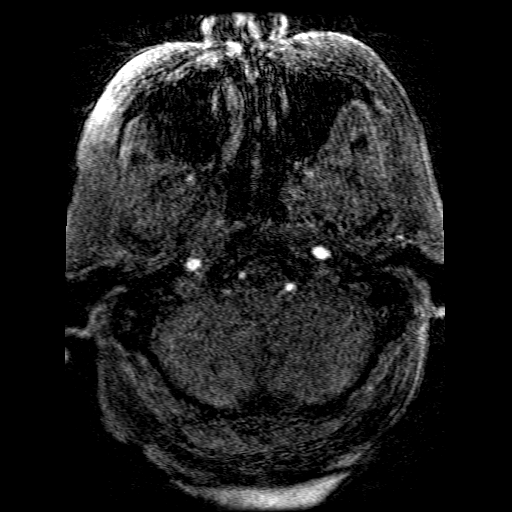
[im 34/184]
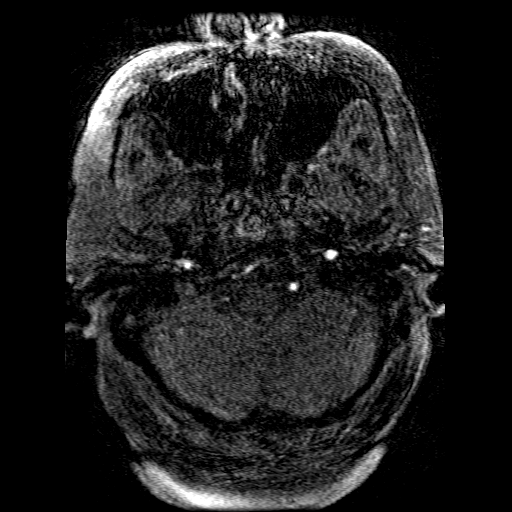
[im 42/184]
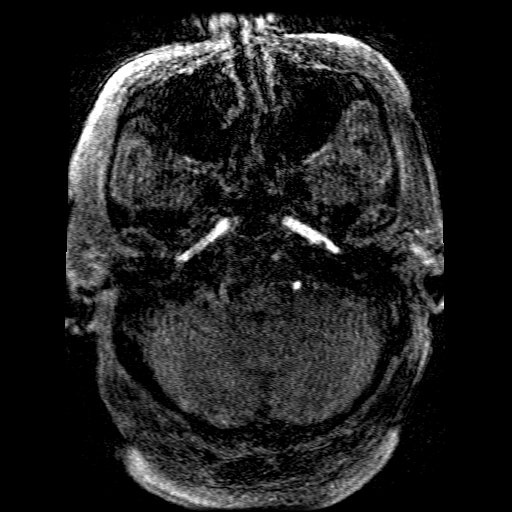
[im 50/184]
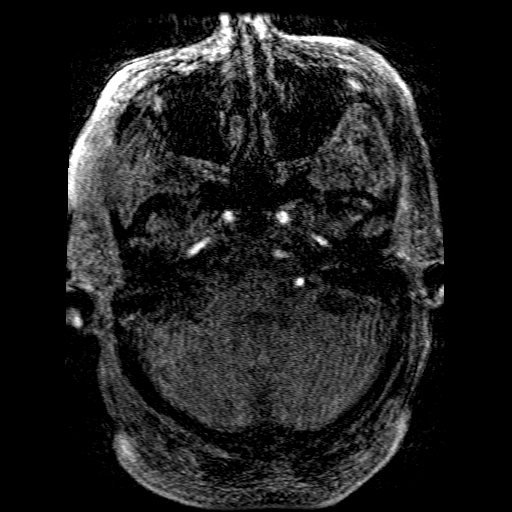
[im 59/184]
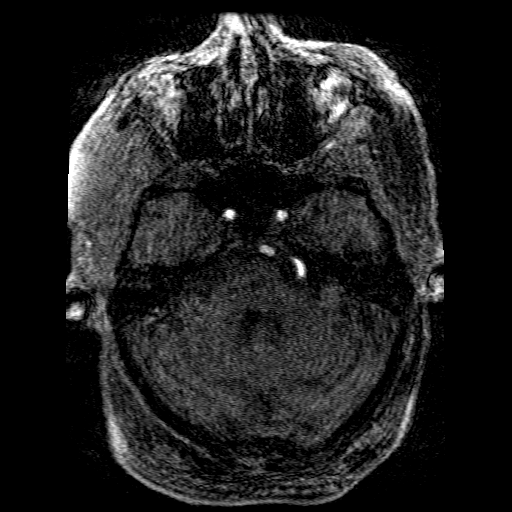
[im 67/184]
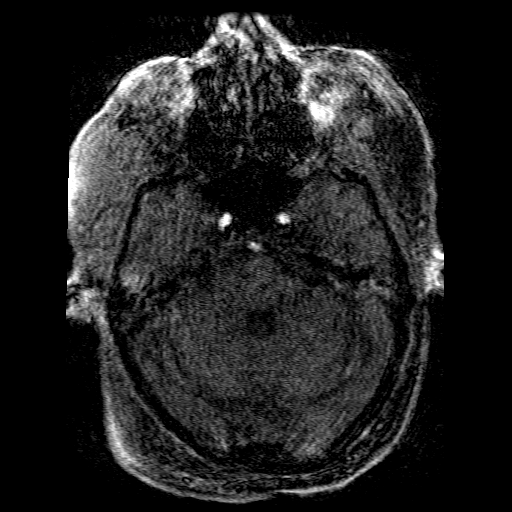
[im 84/184]
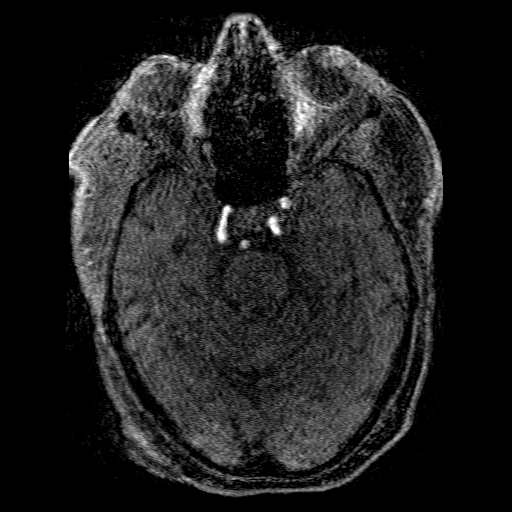
[im 92/184]
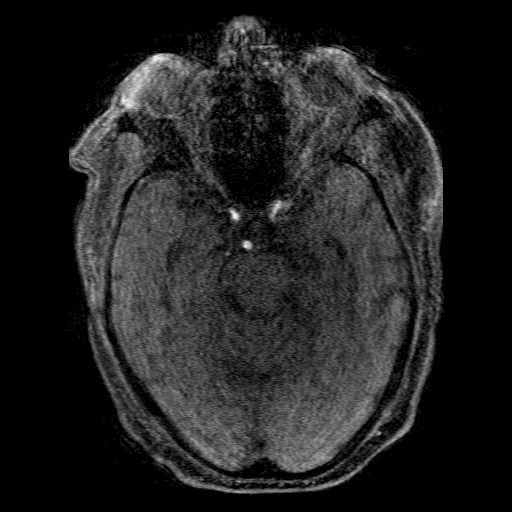
[im 100/184]
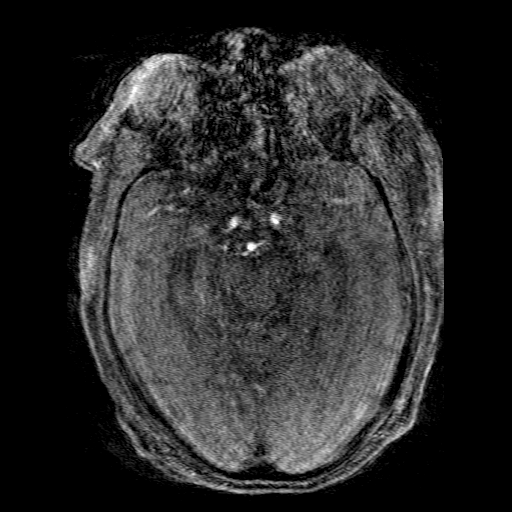
[im 125/184]
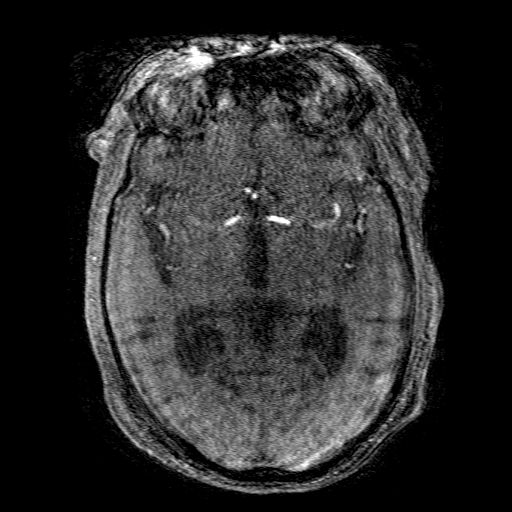
[im 150/184]
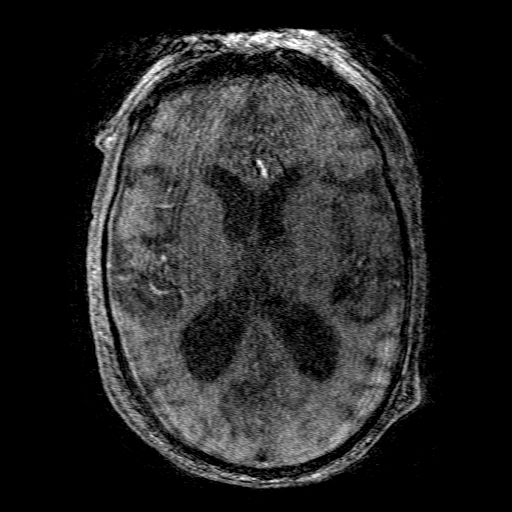
[im 159/184]
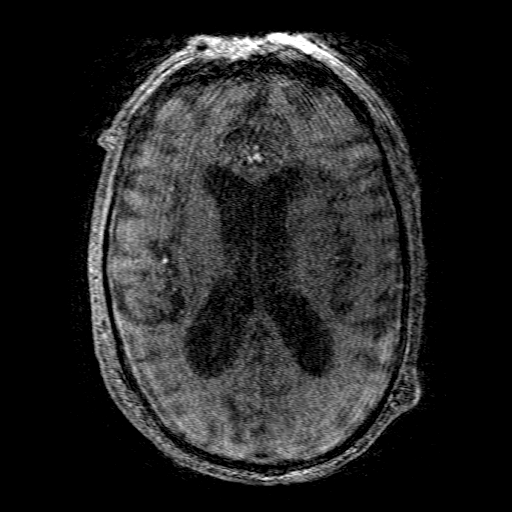
[im 175/184]
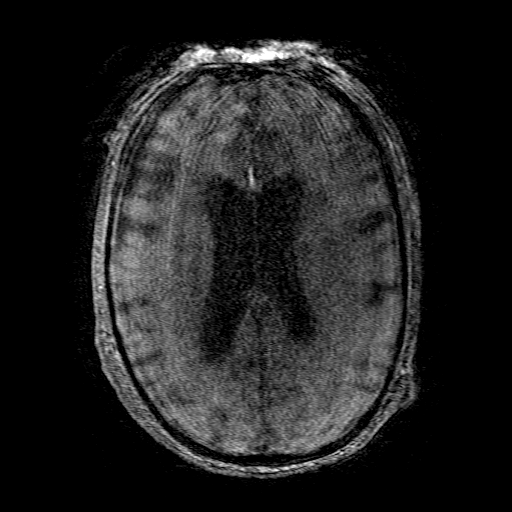

[Series 200: col:ax (id) · axial · 1.0mm · 0.43mm/px · 1 of 1 slices shown]
[im 1/1]
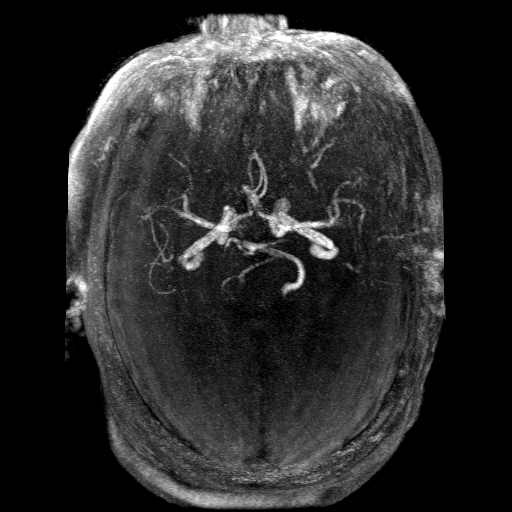

[Series 201: pjn:ax (id) · sagittal · 1.0mm · 0.43mm/px · 2 of 18 slices shown]
[im 1/18]
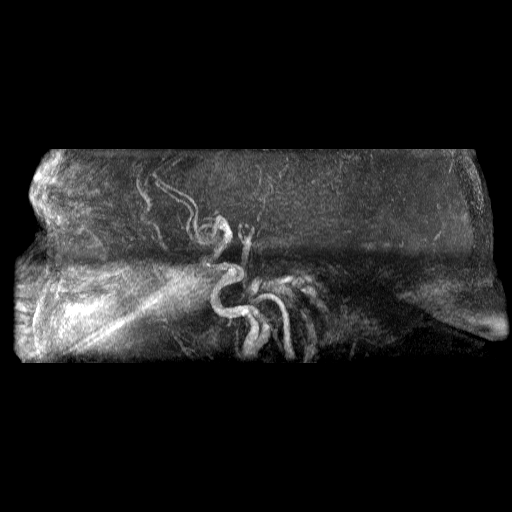
[im 18/18]
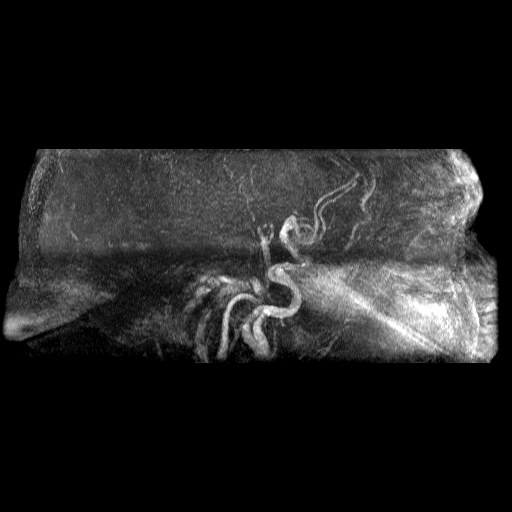

[19 of 48 positions shown; findings below may reference images not displayed]

FINDINGS: POSTERIOR CIRCULATION:

--Vertebral arteries: Normal

--Inferior cerebellar arteries: Poor visualization due to motion.

--Basilar artery: Patent.

--Superior cerebellar arteries: Normal.

--Posterior cerebral arteries: Right P1 and proximal P2 segments are
patent. More distally assessment is limited by motion. On the left,
the PCA is occluded at the P1 P2 junction.

ANTERIOR CIRCULATION:

--Intracranial internal carotid arteries: Normal.

--Anterior cerebral arteries (ACA): Normal.

--Middle cerebral arteries (MCA): Bilaterally, the M1 and proximal
M2 segments are patent.

ANATOMIC VARIANTS: None
IMPRESSION: 1. Occlusion of the left PCA at the P1 P2 junction.
2. Motion degraded examination, but the basilar artery is patent.

## 2020-06-15 IMAGING — DX DG CHEST 1V
1 series · 1 of 1 positions shown · non-contrast
Comparison: [DATE], [DATE]

CLINICAL DATA: 79-year-old male status post feeding tube placement
and central line placement

EXAM:
CHEST  1 VIEW

[chest ap]
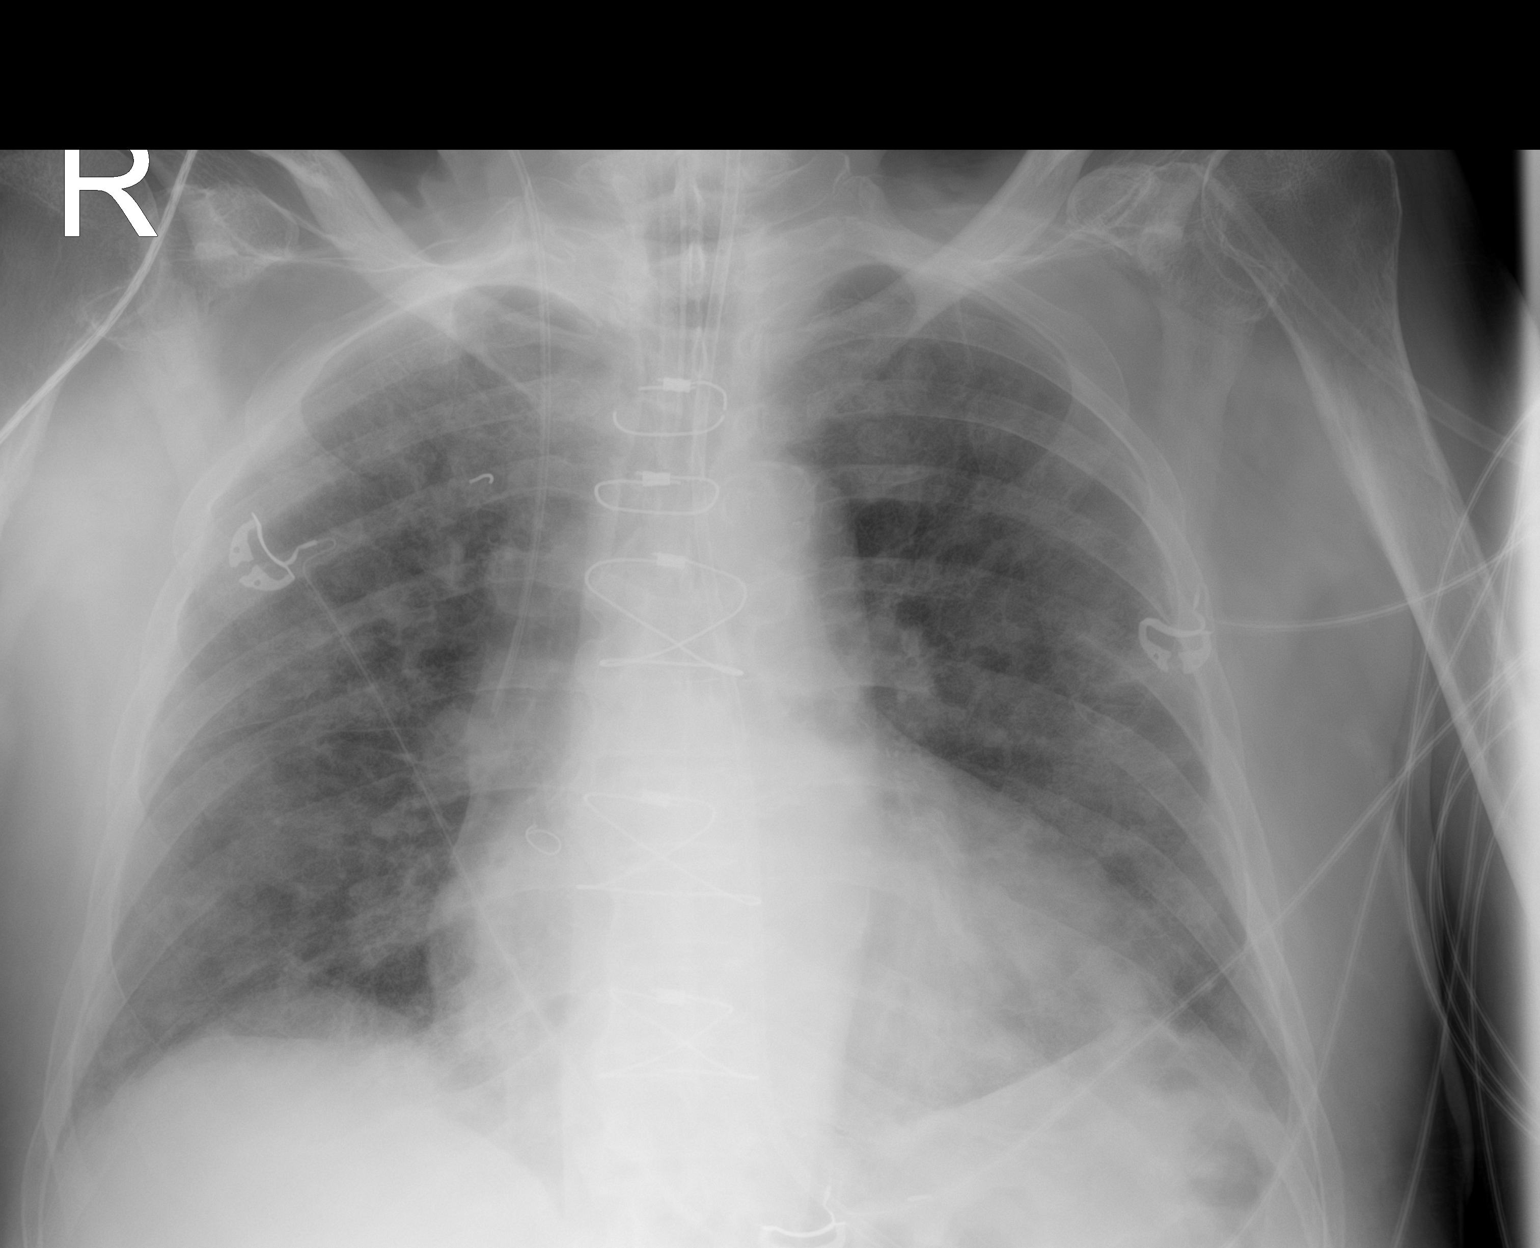

[1 of 1 positions shown; findings below may reference images not displayed]

FINDINGS: Cardiomediastinal silhouette unchanged in size and contour. Surgical
changes of median sternotomy.

Unchanged endotracheal tube terminating 4.1 cm above the carina.

Enteric feeding tube has been placed in the interval, projecting
over the mediastinum, with removal of the gastric tube. The enteric
tube terminates out of the field of view.

Interval placement of right IJ central venous catheter which appears
to terminate superior vena cava.

No pneumothorax.

Multifocal airspace opacity and associated interstitial opacities.
No large pleural effusion.

Surgical changes of median sternotomy
IMPRESSION: Interval removal of gastric tube and placement of enteric feeding
tube, which terminates in the abdomen out of the field of view.

Interval placement of right IJ central venous catheter without
pneumothorax.

Unchanged endotracheal tube.

Similar appearance of multifocal airspace and interstitial disease.

## 2020-06-15 IMAGING — CT CT HEAD W/O CM
3 series · 14 of 47 positions shown, 16 images · non-contrast
Comparison: MRI/MRA head [DATE].

CLINICAL DATA: Mental status change, unknown cause.

EXAM:
CT HEAD WITHOUT CONTRAST
TECHNIQUE: Contiguous axial images were obtained from the base of the skull
through the vertex without intravenous contrast.

[Series 2: head 5.0 h30s · axial · 0.46mm/px · z∈[-89,+46]mm · 8 of 33 slices shown, 10 images]
[im 3/33  brain]
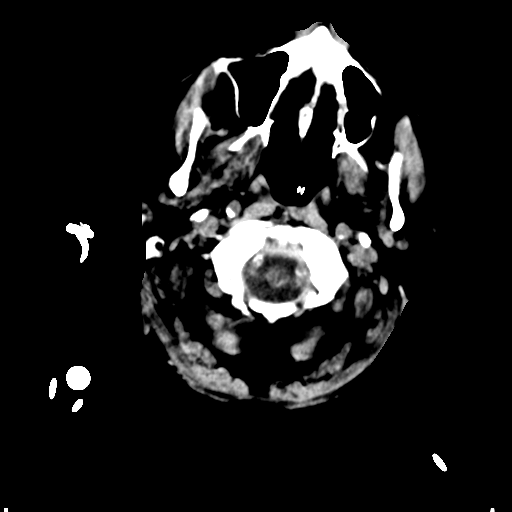
[im 3/33  bone]
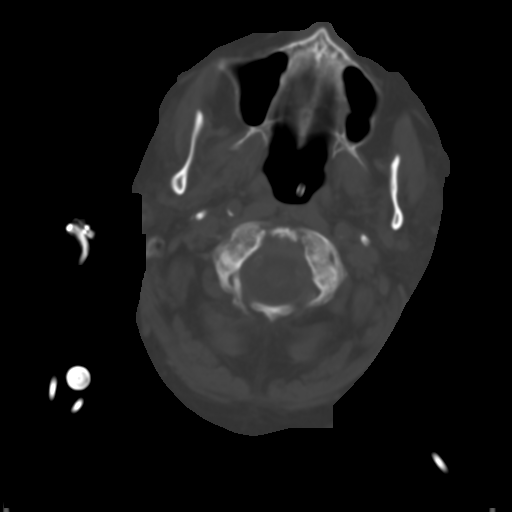
[im 7/33  brain]
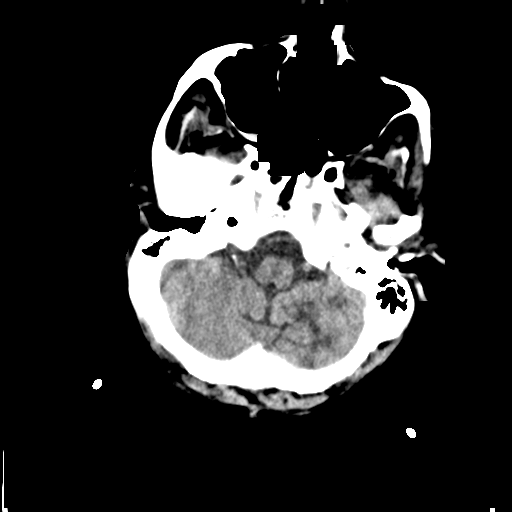
[im 10/33  brain]
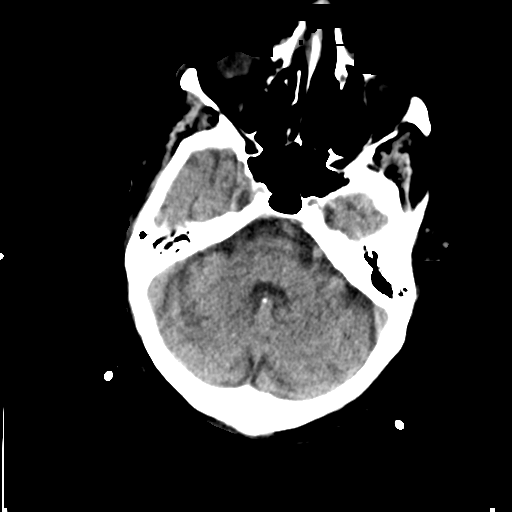
[im 15/33  brain]
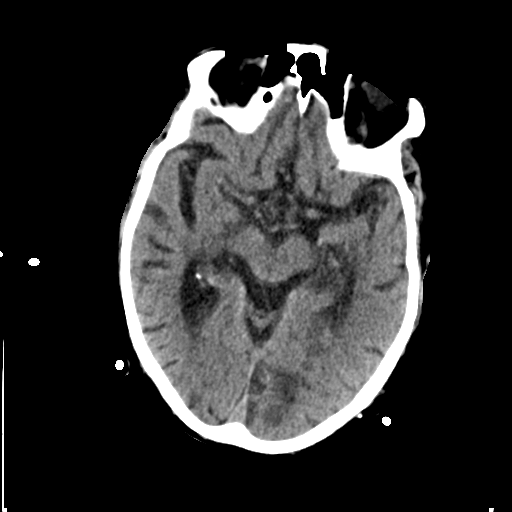
[im 18/33  brain]
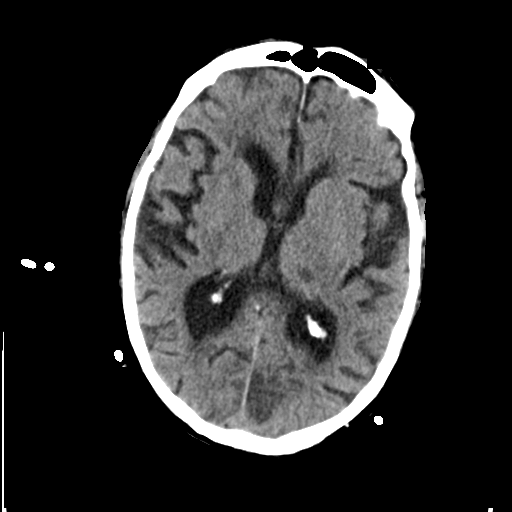
[im 18/33  bone]
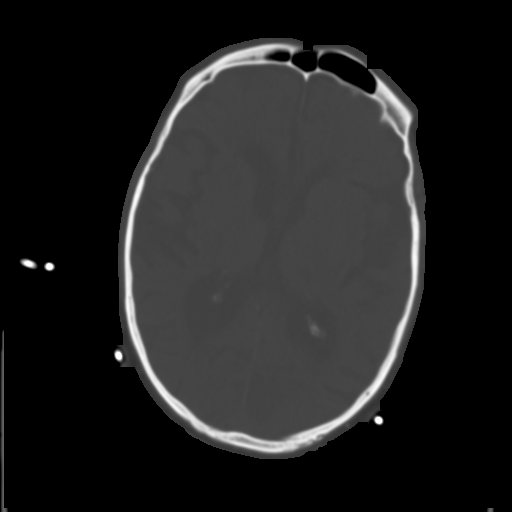
[im 23/33  brain]
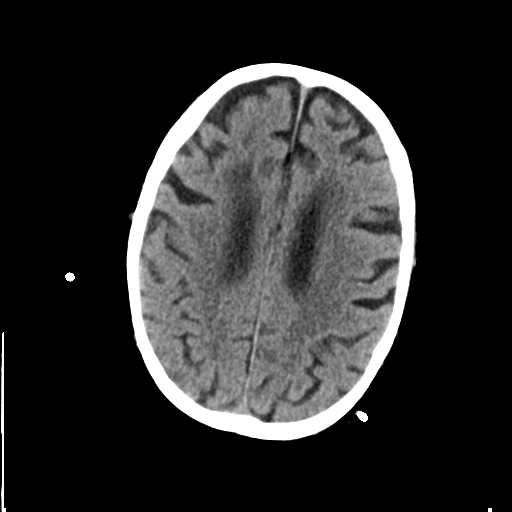
[im 26/33  brain]
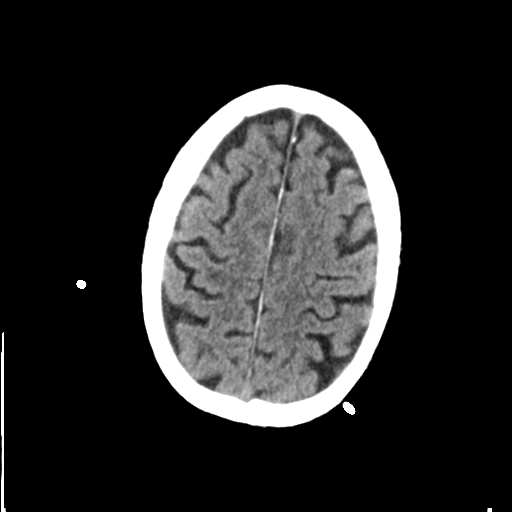
[im 30/33  brain]
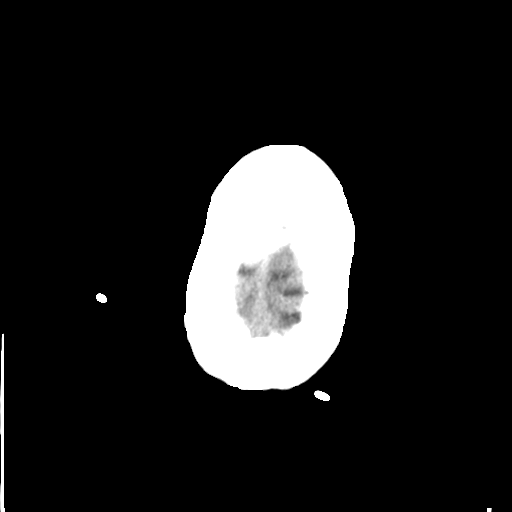

[Series 4: head 3.0 mpr cor · coronal · 0.31mm/px · 3 of 75 slices shown]
[im 25/75  brain]
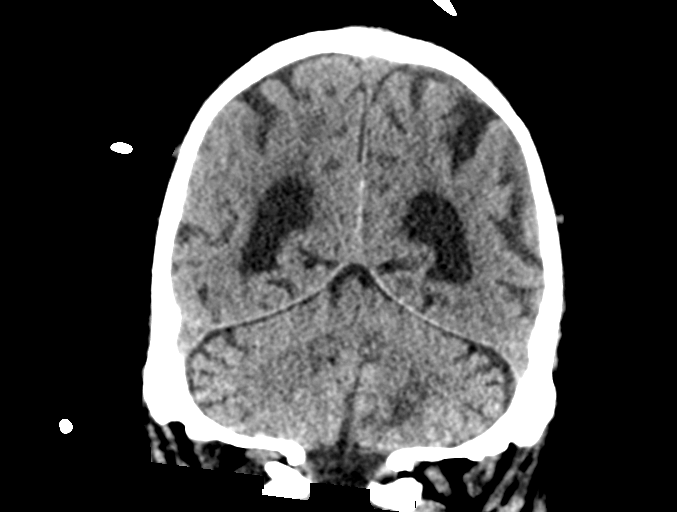
[im 33/75  brain]
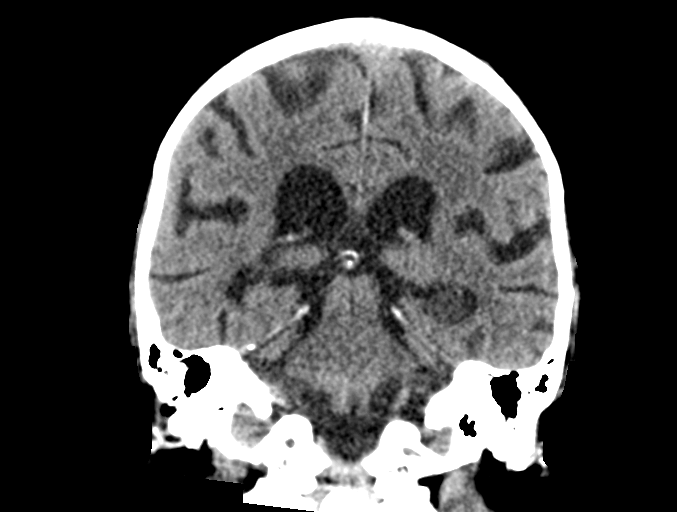
[im 42/75  brain]
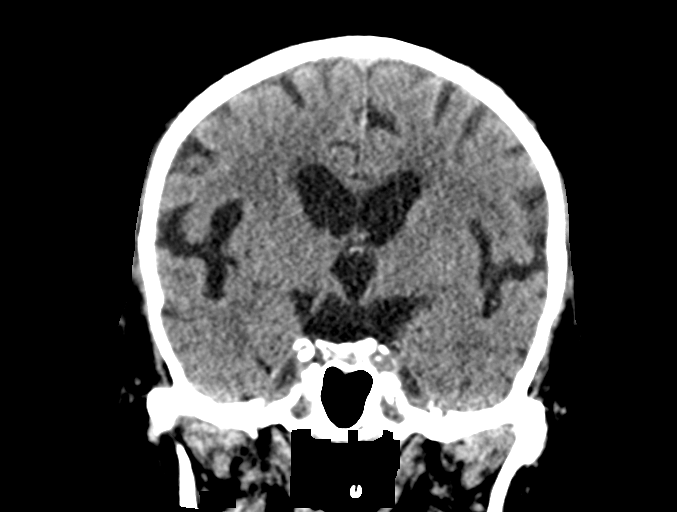

[Series 5: head 3.0 mpr sag · sagittal · 0.33mm/px · 3 of 67 slices shown]
[im 23/67  brain]
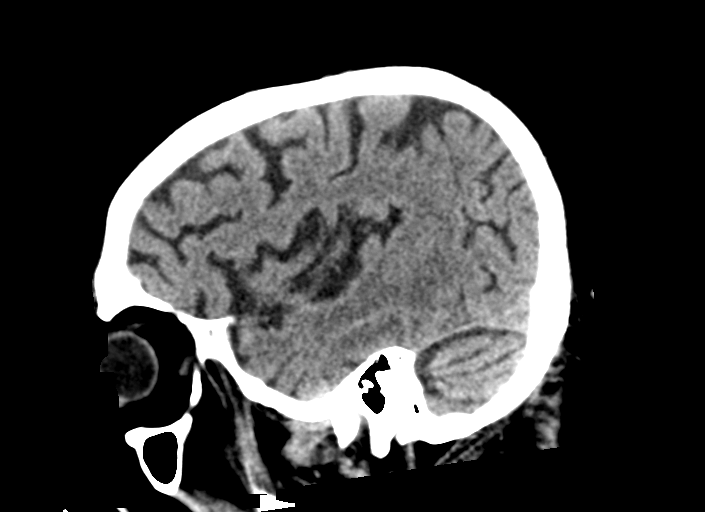
[im 34/67  brain]
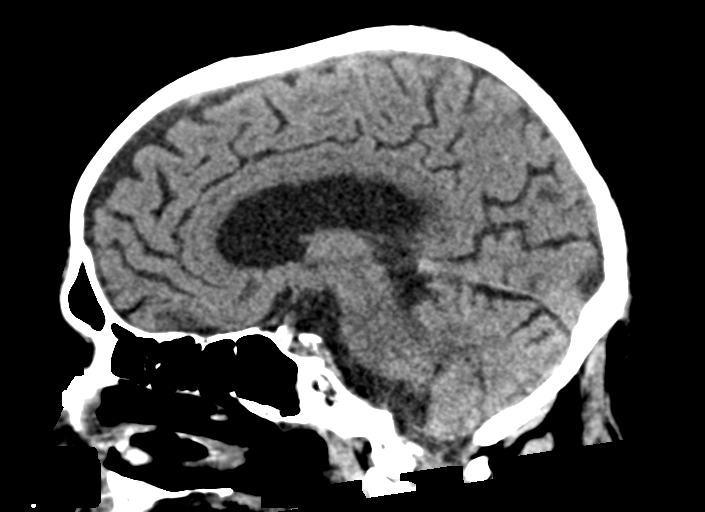
[im 45/67  brain]
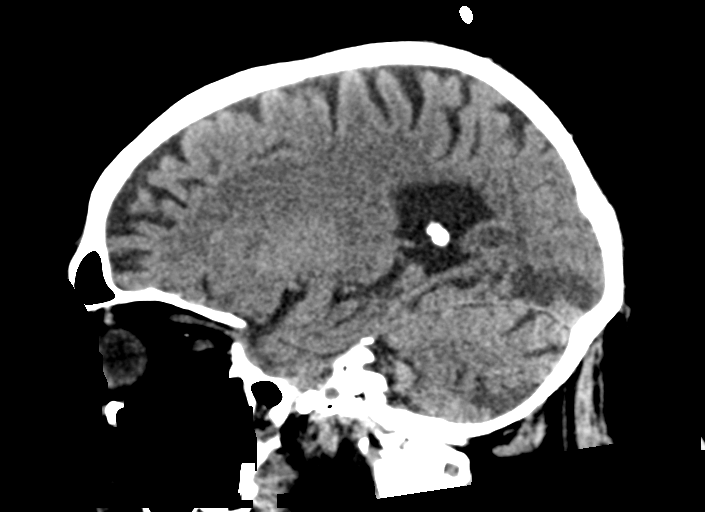

[14 of 47 positions shown; findings below may reference images not displayed]

FINDINGS: Brain:

Multifocal hypodensity at sites of known acute/early subacute
infarcts within the supratentorial and infratentorial brain. As
before, the left PCA territory (thalamus and temporal occipital
lobe) and left PICA territory (cerebellum) are most notably
affected.

No interval intracranial abnormality is appreciable by CT. No
evidence of hemorrhagic conversion.

Stable background generalized cerebral atrophy and chronic small
vessel ischemic disease.

No extra-axial fluid collection.

No evidence of intracranial mass.

No midline shift.

Vascular: No hyperdense vessel. Of note, left PCA occlusion was
demonstrated on the MRA head performed earlier today.
Atherosclerotic calcifications.

Skull: Normal. Negative for fracture or focal lesion.

Sinuses/Orbits: Visualized orbits show no acute finding. Mild
scattered paranasal sinus mucosal thickening. Frothy secretions
within the right sphenoid sinus.

Other: Bilateral mastoid effusions.
IMPRESSION: Redemonstrated multifocal acute/early subacute infarcts within the
supratentorial and infratentorial brain. As before, the left PCA
territory (thalamus and temporal occipital lobes) and left PICA
territory (cerebellum) are most notably affected. Findings are
suspicious for an embolic process.

No interval intracranial abnormality appreciable by CT. No evidence
of hemorrhagic conversion.

Stable background cerebral atrophy and chronic small vessel ischemic
disease.

Paranasal sinus disease as described.

Bilateral mastoid effusions.

## 2020-06-15 IMAGING — DX DG CHEST 1V PORT
1 series · 1 of 1 positions shown · non-contrast
Comparison: [DATE].

CLINICAL DATA: Endotracheal tube.

EXAM:
PORTABLE CHEST 1 VIEW

[chest]
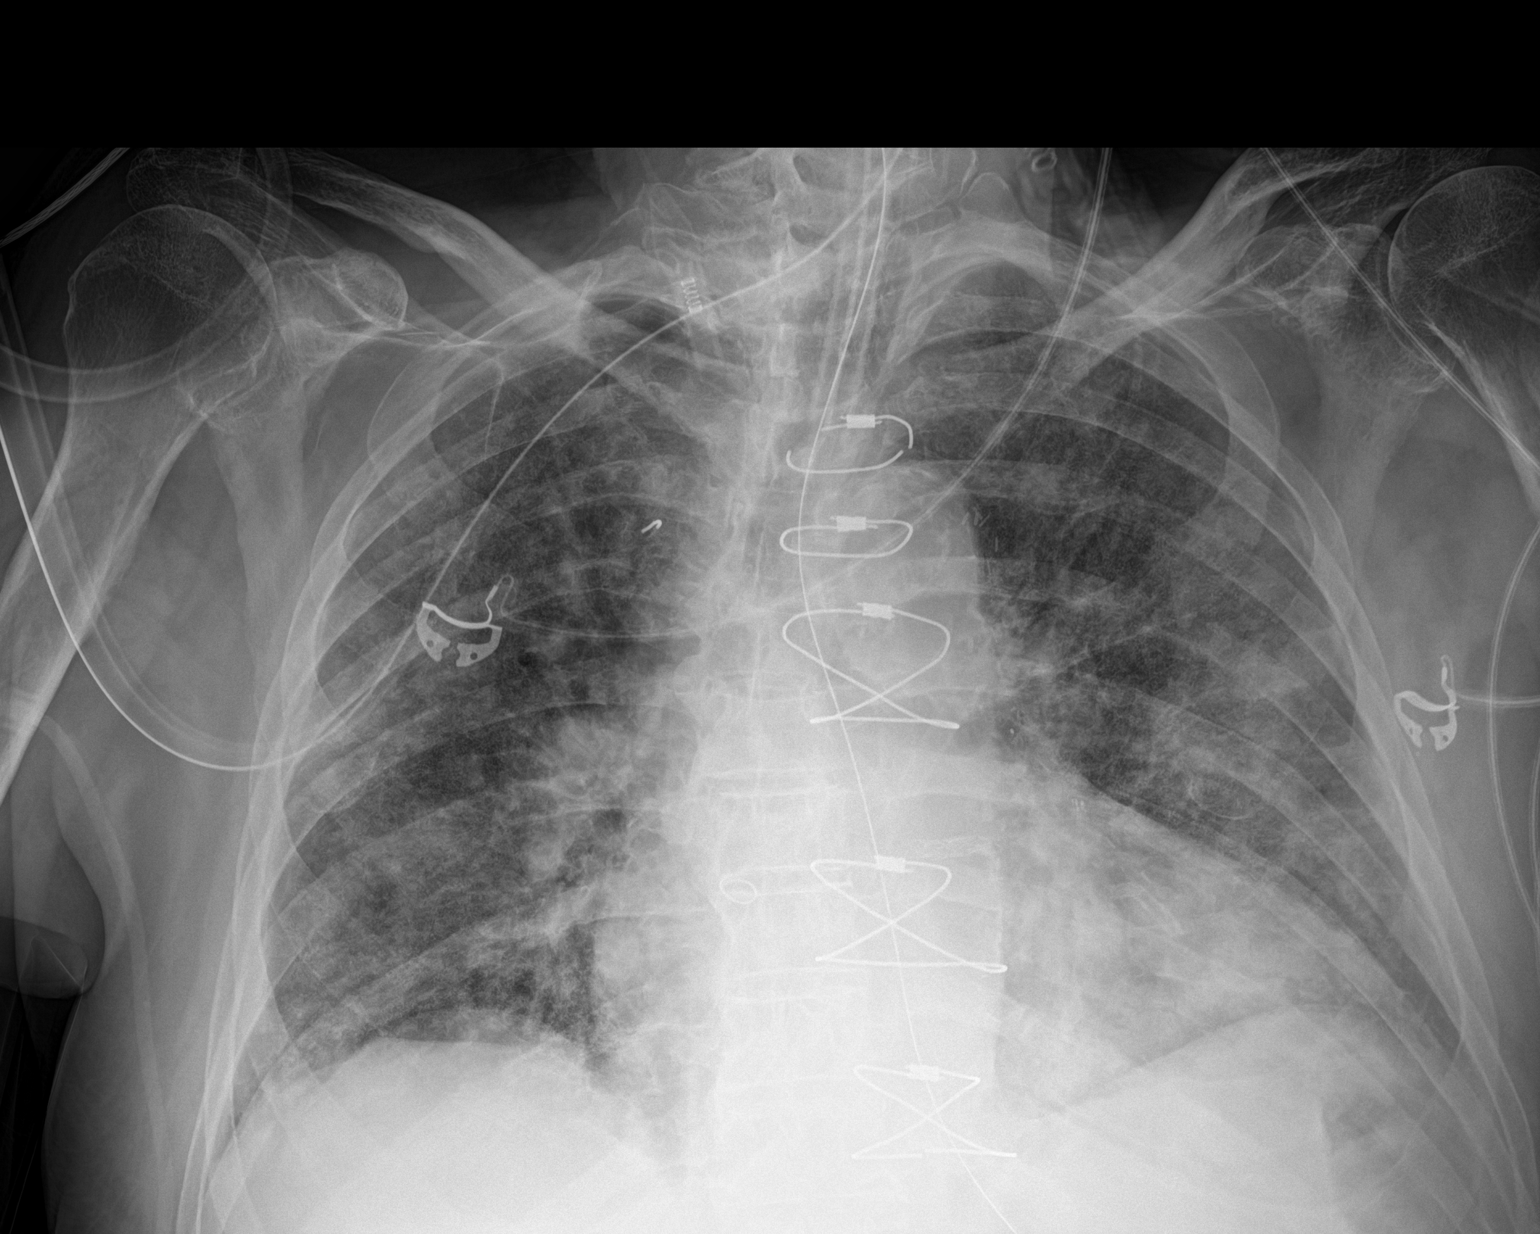

[1 of 1 positions shown; findings below may reference images not displayed]

FINDINGS: Stable cardiomediastinal silhouette. Endotracheal and nasogastric
tubes appear to be in good position. No pneumothorax or pleural
effusion is noted. Stable patchy bilateral lung opacities are noted
consistent with multifocal pneumonia. Bony thorax is unremarkable.
IMPRESSION: Endotracheal and nasogastric tubes in good position. Stable patchy
bilateral lung opacities consistent with multifocal pneumonia.

## 2020-06-15 MED ORDER — INSULIN ASPART 100 UNIT/ML ~~LOC~~ SOLN: 0.0000 [IU] | Freq: Three times a day (TID) | SUBCUTANEOUS | Status: DC

## 2020-06-15 MED ORDER — POLYETHYLENE GLYCOL 3350 17 G PO PACK
17.0000 g | PACK | Freq: Every day | ORAL | Status: DC
  Administered 2020-06-15 – 2020-06-17 (×3): 17 g
  Filled 2020-06-15 (×3): qty 1

## 2020-06-15 MED ORDER — DEXMEDETOMIDINE HCL IN NACL 400 MCG/100ML IV SOLN
0.0000 ug/kg/h | INTRAVENOUS | Status: DC
  Administered 2020-06-15 (×2): 0.4 ug/kg/h via INTRAVENOUS
  Filled 2020-06-15: qty 100

## 2020-06-15 MED ORDER — ORAL CARE MOUTH RINSE
15.0000 mL | OROMUCOSAL | Status: DC
  Administered 2020-06-15 – 2020-06-19 (×38): 15 mL via OROMUCOSAL

## 2020-06-15 MED ORDER — SODIUM CHLORIDE 0.9 % IV SOLN
3.0000 g | Freq: Two times a day (BID) | INTRAVENOUS | Status: DC
  Administered 2020-06-15 (×2): 3 g via INTRAVENOUS
  Filled 2020-06-15 (×2): qty 8

## 2020-06-15 MED ORDER — SENNOSIDES-DOCUSATE SODIUM 8.6-50 MG PO TABS: 1.0000 | ORAL_TABLET | Freq: Every evening | ORAL | Status: DC | PRN

## 2020-06-15 MED ORDER — IPRATROPIUM-ALBUTEROL 0.5-2.5 (3) MG/3ML IN SOLN: 3.0000 mL | RESPIRATORY_TRACT | Status: DC | PRN

## 2020-06-15 MED ORDER — FAMOTIDINE 40 MG/5ML PO SUSR
20.0000 mg | Freq: Every day | ORAL | Status: DC
  Administered 2020-06-15 – 2020-06-17 (×3): 20 mg
  Filled 2020-06-15 (×2): qty 2.5

## 2020-06-15 MED ORDER — VITAL HIGH PROTEIN PO LIQD
1000.0000 mL | ORAL | Status: DC
  Administered 2020-06-15: 1000 mL

## 2020-06-15 MED ORDER — AMIODARONE HCL 200 MG PO TABS
100.0000 mg | ORAL_TABLET | Freq: Every day | ORAL | Status: DC
  Filled 2020-06-15: qty 1

## 2020-06-15 MED ORDER — LACTATED RINGERS IV BOLUS
500.0000 mL | Freq: Once | INTRAVENOUS | Status: AC
  Administered 2020-06-15: 500 mL via INTRAVENOUS

## 2020-06-15 MED ORDER — FENTANYL CITRATE (PF) 100 MCG/2ML IJ SOLN: 25.0000 ug | INTRAMUSCULAR | Status: DC | PRN

## 2020-06-15 MED ORDER — PERFLUTREN LIPID MICROSPHERE
1.0000 mL | INTRAVENOUS | Status: AC | PRN
  Administered 2020-06-15: 2 mL via INTRAVENOUS
  Filled 2020-06-15: qty 10

## 2020-06-15 MED ORDER — DOCUSATE SODIUM 50 MG/5ML PO LIQD
100.0000 mg | Freq: Two times a day (BID) | ORAL | Status: DC
  Administered 2020-06-15 – 2020-06-17 (×5): 100 mg
  Filled 2020-06-15 (×6): qty 10

## 2020-06-15 MED ORDER — BARICITINIB 1 MG PO TABS
1.0000 mg | ORAL_TABLET | Freq: Every day | ORAL | Status: DC
  Filled 2020-06-15: qty 1

## 2020-06-15 MED ORDER — CHLORHEXIDINE GLUCONATE 0.12% ORAL RINSE (MEDLINE KIT)
15.0000 mL | Freq: Two times a day (BID) | OROMUCOSAL | Status: DC
  Administered 2020-06-15 – 2020-06-18 (×8): 15 mL via OROMUCOSAL

## 2020-06-15 MED ORDER — FUROSEMIDE 10 MG/ML IJ SOLN
60.0000 mg | Freq: Once | INTRAMUSCULAR | Status: AC
  Administered 2020-06-15: 60 mg via INTRAVENOUS
  Filled 2020-06-15: qty 6

## 2020-06-15 MED ORDER — ETOMIDATE 2 MG/ML IV SOLN
20.0000 mg | Freq: Once | INTRAVENOUS | Status: AC
  Administered 2020-06-15: 20 mg via INTRAVENOUS

## 2020-06-15 MED ORDER — MIDAZOLAM HCL 2 MG/2ML IJ SOLN
INTRAMUSCULAR | Status: AC
  Administered 2020-06-15: 2 mg
  Filled 2020-06-15: qty 2

## 2020-06-15 MED ORDER — CHLORHEXIDINE GLUCONATE CLOTH 2 % EX PADS
6.0000 | MEDICATED_PAD | Freq: Every day | CUTANEOUS | Status: DC
  Administered 2020-06-15 – 2020-06-18 (×4): 6 via TOPICAL

## 2020-06-15 MED ORDER — POLYETHYLENE GLYCOL 3350 17 G PO PACK
17.0000 g | PACK | Freq: Every day | ORAL | Status: DC
  Filled 2020-06-15: qty 1

## 2020-06-15 MED ORDER — BARICITINIB 1 MG PO TABS
1.0000 mg | ORAL_TABLET | Freq: Every day | ORAL | Status: DC
  Administered 2020-06-15: 1 mg
  Filled 2020-06-15: qty 1

## 2020-06-15 MED ORDER — FENTANYL CITRATE (PF) 100 MCG/2ML IJ SOLN
25.0000 ug | INTRAMUSCULAR | Status: DC | PRN
  Administered 2020-06-15 (×3): 100 ug via INTRAVENOUS
  Administered 2020-06-16 (×3): 50 ug via INTRAVENOUS
  Administered 2020-06-16: 100 ug via INTRAVENOUS
  Administered 2020-06-16: 50 ug via INTRAVENOUS
  Administered 2020-06-17: 100 ug via INTRAVENOUS
  Administered 2020-06-17: 50 ug via INTRAVENOUS
  Filled 2020-06-15 (×10): qty 2

## 2020-06-15 MED ORDER — BARICITINIB 1 MG PO TABS: 4.0000 mg | ORAL_TABLET | Freq: Every day | ORAL | Status: DC

## 2020-06-15 MED ORDER — FAMOTIDINE IN NACL 20-0.9 MG/50ML-% IV SOLN: 20.0000 mg | Freq: Every day | INTRAVENOUS | Status: DC

## 2020-06-15 MED ORDER — DOCUSATE SODIUM 50 MG/5ML PO LIQD
100.0000 mg | Freq: Two times a day (BID) | ORAL | Status: DC
  Filled 2020-06-15: qty 10

## 2020-06-15 MED ORDER — DIPHENHYDRAMINE HCL 50 MG/ML IJ SOLN
25.0000 mg | Freq: Once | INTRAMUSCULAR | Status: DC
Start: 2020-06-15 — End: 2020-06-15

## 2020-06-15 MED ORDER — INSULIN ASPART 100 UNIT/ML ~~LOC~~ SOLN
0.0000 [IU] | SUBCUTANEOUS | Status: DC
  Administered 2020-06-15: 2 [IU] via SUBCUTANEOUS
  Administered 2020-06-15 (×2): 5 [IU] via SUBCUTANEOUS

## 2020-06-15 MED ORDER — METHYLPREDNISOLONE SODIUM SUCC 125 MG IJ SOLR
50.0000 mg | Freq: Two times a day (BID) | INTRAMUSCULAR | Status: AC
  Administered 2020-06-15 – 2020-06-16 (×5): 50 mg via INTRAVENOUS
  Filled 2020-06-15 (×5): qty 2

## 2020-06-15 MED ORDER — INSULIN ASPART 100 UNIT/ML ~~LOC~~ SOLN: 0.0000 [IU] | Freq: Every day | SUBCUTANEOUS | Status: DC

## 2020-06-15 MED ORDER — MIDAZOLAM HCL 2 MG/2ML IJ SOLN: 2.0000 mg | Freq: Once | INTRAMUSCULAR | Status: DC

## 2020-06-15 MED ORDER — HEPARIN SODIUM (PORCINE) 5000 UNIT/ML IJ SOLN
5000.0000 [IU] | Freq: Three times a day (TID) | INTRAMUSCULAR | Status: DC
  Administered 2020-06-16: 5000 [IU] via SUBCUTANEOUS
  Filled 2020-06-15 (×2): qty 1

## 2020-06-15 MED ORDER — LEVOTHYROXINE SODIUM 75 MCG PO TABS: 175.0000 ug | ORAL_TABLET | Freq: Every day | ORAL | Status: DC

## 2020-06-15 MED ORDER — DIPHENHYDRAMINE HCL 50 MG/ML IJ SOLN
25.0000 mg | Freq: Once | INTRAMUSCULAR | Status: AC | PRN
  Administered 2020-06-15: 25 mg via INTRAVENOUS
  Filled 2020-06-15: qty 1

## 2020-06-15 MED ORDER — INSULIN DETEMIR 100 UNIT/ML ~~LOC~~ SOLN
10.0000 [IU] | Freq: Two times a day (BID) | SUBCUTANEOUS | Status: DC
  Administered 2020-06-15 – 2020-06-18 (×8): 10 [IU] via SUBCUTANEOUS
  Filled 2020-06-15 (×9): qty 0.1

## 2020-06-15 MED ORDER — FENTANYL CITRATE (PF) 100 MCG/2ML IJ SOLN
INTRAMUSCULAR | Status: AC
  Filled 2020-06-15: qty 2

## 2020-06-15 MED ORDER — AMIODARONE HCL 200 MG PO TABS
100.0000 mg | ORAL_TABLET | Freq: Every day | ORAL | Status: DC
  Administered 2020-06-16 – 2020-06-17 (×2): 100 mg
  Filled 2020-06-15 (×2): qty 1

## 2020-06-15 MED ORDER — LEVOTHYROXINE SODIUM 75 MCG PO TABS
175.0000 ug | ORAL_TABLET | Freq: Every day | ORAL | Status: DC
  Administered 2020-06-16 – 2020-06-18 (×3): 175 ug
  Filled 2020-06-15 (×3): qty 1

## 2020-06-15 NOTE — Procedures (Signed)
Intubation Procedure Note  Roberto Haley  [id number]  02/23/1941  Date:06/15/20  Time:9:37 AM   Provider Performing:Taronda Comacho V. Mackayla Mullins    Procedure: Intubation (31500)  Indication(s) Respiratory Failure  Consent Risks of the procedure as well as the alternatives and risks of each were explained to the patient and/or caregiver.  Consent for the procedure was obtained and is signed in the bedside chart   Anesthesia Etomidate, Versed and Fentanyl   Time Out Verified patient identification, verified procedure, site/side was marked, verified correct patient position, special equipment/implants available, medications/allergies/relevant history reviewed, required imaging and test results available.   Sterile Technique Usual hand hygeine, masks, and gloves were used   Procedure Description Patient positioned in bed supine.  Sedation given as noted above.  Patient was intubated with endotracheal tube using Glidescope.  View was Grade 1 full glottis .  Number of attempts was 1.  Colorimetric CO2 detector was consistent with tracheal placement.   Complications/Tolerance None; patient tolerated the procedure well. Chest X-ray is ordered to verify placement.   EBL Minimal   Specimen(s) None  Charleton Deyoung V. Elsworth Soho MD

## 2020-06-15 NOTE — Progress Notes (Signed)
Returned from MRI without complication.

## 2020-06-15 NOTE — Progress Notes (Signed)
[facility] Progress Note Patient Name: Roberto Haley DOB: [birth year] MRN: [id number]   Date of Service  06/15/2020  HPI/Events of Note  Patient with a change in mental status.  eICU Interventions  Stat ABG to r/o hypercapnia with CO2 narcosis, If ABG unremarkable for CO2 retention, stat CT brain for decline in mental status.  I  Called Dr. Tacy Learn and  signed this out to him.        Kerry Kass Levi Klaiber 06/15/2020, 6:48 AM

## 2020-06-15 NOTE — Progress Notes (Signed)
Notified E-link that patient is more difficult to arouse and no longer following simple commands, still nods to questions.  Also notified Dr. Lorrin Goodell of patient's change in neuro status.

## 2020-06-15 NOTE — Progress Notes (Signed)
OT Cancellation Note  Patient Details Name: Roberto Haley MRN: [id number] DOB: [birth year]   Cancelled Treatment:    Reason Eval/Treat Not Completed: Patient not medically ready (Pt recently intubated. Will hold until pt more stable and approrpiate for OT intervention)   Zenovia Jarred, MSOT, OTR/L [facility] Office Number: [phone] Pager: [phone]  Zenovia Jarred 06/15/2020, 11:49 AM

## 2020-06-15 NOTE — Progress Notes (Signed)
Unable to perform MRA Neck due to pt condition. Pt breathing heavily causing motion degration on the images and rendering them non-diagnostic. Attempted twice, per nurse unable to give any meds at this time.

## 2020-06-15 NOTE — Consult Note (Signed)
NAME:  Roberto Haley, MRN:  [id number], DOB:  Feb 25, 1941, LOS: 0 ADMISSION DATE:  06/08/2020, CONSULTATION DATE:  11/9 REFERRING MD:  Dr. Lorrin Goodell (Neurology), CHIEF COMPLAINT:  COVID/Stroke   Brief History   79 year old male admitted to [facility] with COVID pneumonia. He had abrupt change in mental status diagnosed as stroke. Transferred to [name] 11/9 for MRI/MRA.  History of present illness   79 year old male with PMH as below, which is significant for CKD, DM, HTN, and DM2. He has received both doses of [place] vaccination. Presented to [facility] ED 11/4 with complaints of SOB with oxygen saturations found to be in the 60s on room air. COVID tested positive. He was admitted and quickly escalated to requirement of 100% NRB. Treatment regimen included decadron, remdesivir, and baricitinib. He initially ahd improved and oxygen was weaning down. 11/8 he had acute change in mental status and CT was concerning for stroke. 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 No CTA due to contrast allergy. The outside hospital was unable to obtain MRA for COVID positive patient, and thus he was transferred to [facility] ICU for further evaluation. He was admitted to the neurology service and PCCM was consulted for concerns of respiratory decline.   Past Medical History   has a past medical history of Arthritis, Chronic kidney disease, Coronary artery disease, Diabetes mellitus, Groin hematoma (02/19/12), Hyperlipidemia, Hypertension, Neuropathy due to type 2 diabetes mellitus ([place]), Obesity, PVC (premature ventricular contraction), and SDH (subdural hematoma) ([place]) (02/06/2013).   Significant Hospital Events   11/4 admit 11/9 tx to [facility]  Consults:    Procedures:    Significant Diagnostic Tests:  11/8 CT head > Low-density in the left occipital lobe suggesting acute to subacute infarction.  Possible infarction in inferior left cerebellum.  No hemorrhage  or mass-effect.  Chronic small vessel ischemic changes elsewhere throughout the brain. 11/9 MRI/MRA >   Micro Data:    Antimicrobials:    Interim history/subjective:    Objective   Blood pressure (!) 146/85, pulse (!) 106, temperature 98.1 F (36.7 C), temperature source Axillary, resp. rate (!) 26, SpO2 (!) 87 %.    FiO2 (%):  [100 %] 100 %   Intake/Output Summary (Last 24 hours) at 06/13/2020 2158 Last data filed at 06/17/2020 1900 Gross per 24 hour  Intake --  Output 50 ml  Net -50 ml   There were no vitals filed for this visit.  Examination: General: Elderly male in moderate distress HENT: [place]/AT, PERRL, no JVD Lungs: Coarse wheeze and crackles thorughout Cardiovascular: Tachy, regular, no MRG Abdomen: Soft, non-tender, non-distended Extremities: No acute deformity Neuro: Agitated, delirious, oriented to self only.    Resolved Hospital Problem list     Assessment & Plan:   Acute hypoxemic respiratory failure secondary to COVID-10 pneumonia. Lung ultrasound consistent with pulmonary edema.  - Supplemental O2 to keep SpO2 > 88% - may require intubation - ongoing steroids, baricitinib. Low threshold to DC with worsening renal failure.  - Lasix 60mg  - s/p 5 days remdesivir - PRN duoneb   CVA - MRI/MRA pending - Plan per neurology  Acute encephalopathy: most likely due to stroke - Precedex to limit flailing for MRI - per neurology  PAF on Eliquis CAD s/p CABG - Anticoagulation per neuro - Holding amiodarone,  Metoprolol, isosorbide, hydralazine,   Hypothyroid - continue home synthroid   DM - hold metformin - CBG monitoring and SSI   Best  practice:  Diet: NPO Pain/Anxiety/Delirium protocol (if indicated): precedex VAP protocol (if indicated): NA DVT prophylaxis: heparin infusion GI prophylaxis: PPI Glucose control: CBG monitoring and SSI Mobility: BR Code Status: FULL CODE Family Communication: per primary Disposition: ICU  Labs   CBC: No  results for input(s): WBC, NEUTROABS, HGB, HCT, MCV, PLT in the last 168 hours.  Basic Metabolic Panel: No results for input(s): NA, K, CL, CO2, GLUCOSE, BUN, CREATININE, CALCIUM, MG, PHOS in the last 168 hours. GFR: CrCl cannot be calculated (Patient's most recent lab result is older than the maximum 21 days allowed.). No results for input(s): PROCALCITON, WBC, LATICACIDVEN in the last 168 hours.  Liver Function Tests: No results for input(s): AST, ALT, ALKPHOS, BILITOT, PROT, ALBUMIN in the last 168 hours. No results for input(s): LIPASE, AMYLASE in the last 168 hours. No results for input(s): AMMONIA in the last 168 hours.  ABG No results found for: PHART, PCO2ART, PO2ART, HCO3, TCO2, ACIDBASEDEF, O2SAT   Coagulation Profile: No results for input(s): INR, PROTIME in the last 168 hours.  Cardiac Enzymes: No results for input(s): CKTOTAL, CKMB, CKMBINDEX, TROPONINI in the last 168 hours.  HbA1C: Hgb A1c MFr Bld  Date/Time Value Ref Range Status  11/01/2014 12:00 AM 10.7 (A) 4.0 - 6.0 % Final    CBG: Recent Labs  Lab 06/07/2020 2107  GLUCAP 204*    Review of Systems:   Unable as patient is encephalopathic and intubated  Past Medical History  He,  has a past medical history of Arthritis, Chronic kidney disease, Coronary artery disease, Diabetes mellitus, Groin hematoma (02/19/12), Hyperlipidemia, Hypertension, Neuropathy due to type 2 diabetes mellitus ([place]), Obesity, PVC (premature ventricular contraction), and SDH (subdural hematoma) ([place]) (02/06/2013).   Surgical History    Past Surgical History:  Procedure Laterality Date  . CARDIAC CATHETERIZATION  01/14/12   LV FXN EF 50-55% W/MILD DISTAL INFERIOR HYPOKINESIS; PCI: LAD PTCA/STENT W/NEW 2.75X22 RESOLUTE DES IN MID LAD POST DIALTED TO 3.08 TO 2.97 TAPER: 99%-80% TO 0; LCX: PTCA VIA LM STENT W/2.25X12 SPRINTER BALLOON: 90%-5; LAD: PATENT PROXIMAL STENT EXTENDING INTO LM W/30-40% SMOOTH NARROWING IN DISTAL PORTION OF STENT;  99% IN STENT RESTENOSIS OF MID LAD STENT   . CARDIAC CATHETERIZATION N/A 12/02/2015   Procedure: Left Heart Cath and Coronary Angiography;  Surgeon: Troy Sine, MD;  Location: [facility] CV LAB;  Service: Cardiovascular;  Laterality: N/A;  . CARDIAC CATHETERIZATION N/A 12/02/2015   Procedure: Coronary Stent Intervention;  Surgeon: Troy Sine, MD;  Location: [place] CV LAB;  Service: Cardiovascular;  Laterality: N/A;  . CARDIOVERSION N/A 09/26/2018   Procedure: CARDIOVERSION;  Surgeon: Troy Sine, MD;  Location: [facility] ENDOSCOPY;  Service: Cardiovascular;  Laterality: N/A;  . CORONARY ARTERY BYPASS GRAFT  1997   LIMA to the LAD and vein to the RCA;  . CORONARY STENT PLACEMENT  12/02/2015   PAD  . DOPPLER ECHOCARDIOGRAPHY  01/09/12   LV NORMAL IN SIZE, NORMAL WAL THICKNESS, EF 50-55%; MILD POSTERIOR WALL HYOPKINESIS, THERE IS MILD INFERIOR WALL HYPOKINESIS  . HYPOTHENAR FAT PAD TRANSFER    . LEFT HEART CATHETERIZATION WITH CORONARY/GRAFT ANGIOGRAM N/A 01/14/2012   Procedure: LEFT HEART CATHETERIZATION WITH Beatrix Fetters;  Surgeon: Troy Sine, MD;  Location: [facility] CATH LAB;  Service: Cardiovascular;  Laterality: N/A;  . LOWER ARTERIAL DOPPLER  01/24/12   ESSENTIALLY NORMAL RIGHT GROIN DUPLEX EVALUATION S/P THROMBIN INJECTION  . LOWER VENOUS DUPLEX  02/05/12   ESSENTIALLY NORMAL RIGHT LOWER EXTRIMTY VENOUS DUPLEX  DOPPLER EVALUATION  . NM MYOVIEW LTD  01/09/12   HIGH RISK SCAN. COMPARED TO PREVIOUS STUDY, THERE IS NOW ISCHEMIA PRESENT. ABNORMAL MYOCARDIAL PERFUSION STUDY. THERE IS NEW MILD INFEROLATERAL ISCHEMIA TOWARDS THE BASE; PT TO FOLLOW UP WITH DR. [name]  . PERCUTANEOUS CORONARY STENT INTERVENTION (PCI-S) N/A 01/14/2012   Procedure: PERCUTANEOUS CORONARY STENT INTERVENTION (PCI-S);  Surgeon: Troy Sine, MD;  Location: [facility] CATH LAB;  Service: Cardiovascular;  Laterality: N/A;     Social History   reports that he has never smoked. He has never used smokeless tobacco. He  reports that he does not drink alcohol and does not use drugs.   Family History   His family history includes Cancer in his father; Diabetes in his paternal aunt. There is no history of Heart disease.   Allergies Allergies  Allergen Reactions  . Diltiazem Other (See Comments)    Unknown  . Lovastatin Other (See Comments)    Unknown  . Proton Pump Inhibitors Other (See Comments)    Unknown   . Tramadol Nausea And Vomiting  . Codeine Rash     Home Medications  Prior to Admission medications   Medication Sig Start Date End Date Taking? Authorizing Provider  atorvastatin (LIPITOR) 40 MG tablet TAKE 1 TABLET(40 MG) BY MOUTH DAILY 05/06/20   Troy Sine, MD  acetaminophen (TYLENOL) 500 MG tablet Take 500 mg by mouth every 6 (six) hours as needed.    [provider]  amiodarone (PACERONE) 200 MG tablet TAKE 1 TABLET(200 MG) BY MOUTH TWICE DAILY 06/06/20   Troy Sine, MD  amLODipine (NORVASC) 5 MG tablet Take 1 tablet (5 mg total) by mouth daily. 03/14/20 06/12/20  Troy Sine, MD  benazepril (LOTENSIN) 40 MG tablet Take 1 tablet by mouth daily. 01/19/19   [provider]  cholecalciferol (VITAMIN D) 1000 UNITS tablet Take 1,000 Units by mouth daily.    [provider]  clopidogrel (PLAVIX) 75 MG tablet TAKE 1 TABLET(75 MG) BY MOUTH DAILY WITH BREAKFAST 01/05/20   Troy Sine, MD  DUREZOL 0.05 % EMUL  06/23/19   [provider]  ELIQUIS 5 MG TABS tablet TAKE 1 TABLET(5 MG) BY MOUTH TWICE DAILY 04/04/20   Troy Sine, MD  furosemide (LASIX) 40 MG tablet Take one tablet (40 mg) by mouth every morning and half a tablet (20 mg) by mouth EVERY afternoon. 04/22/20   Troy Sine, MD  hydrALAZINE (APRESOLINE) 50 MG tablet TAKE 1 AND 1/2 TABLETS(75 MG) BY MOUTH TWICE DAILY 06/06/20   Troy Sine, MD  insulin aspart (NOVOLOG) 100 UNIT/ML FlexPen Inject 14 Units into the skin 2 (two) times daily with a meal.     [provider]  isosorbide  mononitrate (IMDUR) 60 MG 24 hr tablet TAKE 1 AND 1/2 TABLETS BY MOUTH EVERY AM AND TAKE 0.5 TABLET BY MOUTH EVERY EVENING 01/05/20   Troy Sine, MD  ketoconazole (NIZORAL) 2 % cream Apply 1 fingertip amount to each foot daily. 12/29/19   Evelina Bucy, DPM  Lancets (ONETOUCH DELICA PLUS [id number]) MISC USE AS DIRECTED TO TEST BLOOD GLUCOSE BID 05/17/19   [provider]  LEVEMIR FLEXTOUCH 100 UNIT/ML Pen Inject 44 Units into the skin 2 (two) times daily.  10/07/14   [provider]  levothyroxine (SYNTHROID) 150 MCG tablet Take 150 mcg by mouth every morning. 10/13/19   [provider]  LINZESS 145 MCG CAPS capsule Take 145 mcg by mouth daily before  breakfast.  11/15/15   [provider]  metFORMIN (GLUCOPHAGE-XR) 750 MG 24 hr tablet Take 750 mg by mouth daily with breakfast.    [provider]  metoprolol succinate (TOPROL-XL) 50 MG 24 hr tablet TAKE 1 TABLET(50 MG) BY MOUTH DAILY 04/15/20   Troy Sine, MD  Multiple Vitamin (MULTIVITAMIN WITH MINERALS) TABS Take 1 tablet by mouth daily.    [provider]  nitroGLYCERIN (NITROSTAT) 0.4 MG SL tablet Place 1 tablet (0.4 mg total) under the tongue every 5 (five) minutes as needed. For chest pain 12/03/15 04/22/20  Barrett, Evelene Croon, PA-C  NOVOFINE AUTOCOVER 30G X 8 MM MISC  05/26/14   [provider]  ofloxacin (OCUFLOX) 0.3 % ophthalmic solution  06/23/19   [provider]  ONE TOUCH ULTRA TEST test strip  09/01/14   [provider]  oxybutynin (DITROPAN) 5 MG tablet Take 1 tablet by mouth daily. 06/26/13   [provider]  potassium chloride SA (KLOR-CON) 20 MEQ tablet TAKE 1 TABLET BY MOUTH DAILY AS NEEDED 02/09/20   Almyra Deforest, PA  TRULICITY 1.5 LS/9.3TD SOPN Inject 1.5 mg as directed once a week.  03/25/15   [provider]  vitamin C (ASCORBIC ACID) 500 MG tablet Take 500 mg by mouth daily.    [provider]     Critical care time: 46  min      Georgann Housekeeper, AGACNP-BC [place] for personal pager PCCM on call pager [phone]  07/05/2020 9:58 PM

## 2020-06-15 NOTE — Progress Notes (Signed)
Speech Language Pathology Discharge Patient Details Name: Roberto Haley MRN: [id number] DOB: 1941/03/06 Today's Date: 06/15/2020 Time:  -     Patient discharged from SLP services secondary to medical decline - will need to re-order SLP to resume therapy services.Intubated this morning Please see latest therapy progress note for current level of functioning and progress toward goals.    Progress and discharge plan discussed with patient and/or caregiver: Patient unable to participate in discharge planning and no caregivers available  GO     Houston Siren 06/15/2020, 11:22 AM   Orbie Pyo Colvin Caroli.Ed Risk analyst [phone] Office [phone]

## 2020-06-15 NOTE — Progress Notes (Addendum)
NAME:  Roberto Haley, MRN:  [id number], DOB:  03-12-1941, LOS: 1 ADMISSION DATE:  06/24/2020, CONSULTATION DATE:  11/9 REFERRING MD:  Dr. Lorrin Goodell (Neurology), CHIEF COMPLAINT:  COVID/Stroke   Brief History   79 year old male admitted to [facility] with COVID pneumonia. He had abrupt change in mental status diagnosed as stroke. Transferred to [name] 11/9 for MRI/MRA.  History of present illness   79 year old male with PMH as below, which is significant for CKD, DM, HTN, and DM2. He has received both doses of [place] vaccination. Presented to [facility] ED 11/4 with complaints of SOB with oxygen saturations found to be in the 60s on room air. COVID tested positive. He was admitted and quickly escalated to requirement of 100% NRB. Treatment regimen included decadron, remdesivir, and baricitinib. He initially  improved and oxygen was weaning down. 11/8 he had acute change in mental status and CT was concerning for stroke. 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 No CTA due to contrast allergy. The outside hospital was unable to obtain MRA for COVID positive patient, and thus he was transferred to [facility] ICU for further evaluation. He was admitted to the neurology service and PCCM was consulted for concerns of respiratory decline.   Past Medical History   has a past medical history of Arthritis, Chronic kidney disease, Coronary artery disease, Diabetes mellitus, Groin hematoma (02/19/12), Hyperlipidemia, Hypertension, Neuropathy due to type 2 diabetes mellitus ([place]), Obesity, PVC (premature ventricular contraction), and SDH (subdural hematoma) ([place]) (02/06/2013).   Significant Hospital Events   11/4 admit 11/9 tx to [name] 11/10 intubated  Consults:    Procedures:   11/10 ETT >>  Significant Diagnostic Tests:  11/8 CT head > Low-density in the left occipital lobe suggesting acute to subacute infarction.  Possible infarction in inferior  left cerebellum.  No hemorrhage or mass-effect.  Chronic small vessel ischemic changes elsewhere throughout the brain. 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 On high flow nasal cannula and nonrebreather Afebrile  Objective   Blood pressure 119/71, pulse 68, temperature (!) 96.6 F (35.9 C), temperature source Axillary, resp. rate 15, height 5\' 8"  (1.727 m), weight 91 kg, SpO2 100 %.    Vent Mode: PRVC FiO2 (%):  [40 %-100 %] 40 % Set Rate:  [20 bmp] 20 bmp Vt Set:  [550 mL] 550 mL PEEP:  [5 cmH20] 5 cmH20 Plateau Pressure:  [18 cmH20] 18 cmH20   Intake/Output Summary (Last 24 hours) at 06/15/2020 1046 Last data filed at 06/15/2020 1027 Gross per 24 hour  Intake 30.75 ml  Output 1400 ml  Net -1369.25 ml   Filed Weights   06/15/20 0000  Weight: 91 kg    Examination: General: Elderly male in moderate distress, on NRB and HFNC HENT: [place]/AT, PERRL, no JVD Lungs: Coarse wheeze and crackles thorughout , accessory muscle use/abdominal breathing Cardiovascular: Tachy, regular, no MRG Abdomen: Soft, non-tender, non-distended Extremities: No acute deformity Neuro: Confused, does not follow commands, opens eyes, right hemiplegia, pupils unequal, left 4 mm, right 3 mm both reactive to light  Resolved Hospital Problem list     Assessment & Plan:   Acute  hypoxemic respiratory failure secondary to COVID-10 pneumonia. Lung ultrasound consistent with pulmonary edema.  -Some response to Lasix , but hypoxia persisted.  Due to worsening mental status, he was intubated for airway protection and hypoxia -Lung protective ventilation, low plateau pressure, PF ratio of is high so does not need  proning - add empiric unasyn for aspiration, obtain reps cx    COVID-19  with Acute Pneumonia  - s/p 5 days remdesivir - -Continue steroids, baricitinib-adjust to renal dose  Acute CVA -brainstem involvement explains his inability to maintain airway, guarded prognosis but basilar artery is open so hopefully he can have some recovery Acute encephalopathy: Related to hypoxia and stroke - Precedex and intermittent fentanyl with goal RASS 0 to -1 - per neurology -Maintain systolic blood pressure 161 range  PAF on Eliquis CAD s/p CABG - Anticoagulation per neuro - Holding amiodarone,  Metoprolol, isosorbide, hydralazine,   AKI -does have history of CKD,?  Baseline IV normal saline at 50/hour  Hypothyroid - continue home synthroid   DM - hold metformin - CBG monitoring and SSI -start TFs   Best practice:  Diet: NPO Pain/Anxiety/Delirium protocol (if indicated): precedex VAP protocol (if indicated): NA DVT prophylaxis: heparin sq GI prophylaxis: PPI Glucose control: CBG monitoring and SSI Mobility: BR Code Status: FULL CODE Family Communication: Daughter updated, wife has dementia Disposition: ICU  Labs   CBC: Recent Labs  Lab 06/15/20 0016 06/15/20 0746 06/15/20 1024  WBC 19.4*  --   --   HGB 12.0* 10.9* 9.9*  HCT 36.8* 32.0* 29.0*  MCV 89.5  --   --   PLT 229  --   --     Basic Metabolic Panel: Recent Labs  Lab 06/15/20 0016 06/15/20 0746 06/15/20 1024  NA 140 143 143  K 4.6 4.6 4.5  CL 109  --   --   CO2 16*  --   --   GLUCOSE 213*  --   --   BUN 61*  --   --   CREATININE 2.29*  2.25*  --   --   CALCIUM 8.5*  --   --    GFR: Estimated Creatinine Clearance: 29.6 mL/min (A) (by C-G formula based on SCr of 2.25 mg/dL (H)). Recent Labs  Lab 06/15/20 0016  PROCALCITON 0.57  WBC 19.4*    Liver Function Tests: Recent Labs  Lab 06/15/20 0016  AST 64*  ALT 40  ALKPHOS 229*  BILITOT 1.0  PROT 6.3*  ALBUMIN 2.3*   No results for input(s):  LIPASE, AMYLASE in the last 168 hours. No results for input(s): AMMONIA in the last 168 hours.  ABG    Component Value Date/Time   PHART 7.335 (L) 06/15/2020 1024   PCO2ART 39.1 06/15/2020 1024   PO2ART 475 (H) 06/15/2020 1024   HCO3 20.9 06/15/2020 1024   TCO2 22 06/15/2020 1024   ACIDBASEDEF 5.0 (H) 06/15/2020 1024   O2SAT 100.0 06/15/2020 1024      Critical care time: 40 min     Kara Mead MD. FCCP. [facility] See Amion for pager  If no response to pager , please call [phone]  After 7:00 pm call Elink  [phone]    06/15/2020 10:46 AM

## 2020-06-15 NOTE — Progress Notes (Signed)
Pharmacy Antibiotic Note  Roberto Haley is a 79 y.o. male admitted on 06/22/2020 as transfer from [facility] with COVID-19 after change in mental status found to have a stroke. Patient intubated on 11/10 due to worsening mental status. Pharmacy has been consulted for Unasyn dosing for aspiration pneumonia. Per [facility] health records patient received ceftriaxone and doxycycline starting on 11/5. Current CrCl is 29.6 ml/min. WBC 19.4.   Plan: Start Unasyn 3g q12h  Monitor renal function closely and adjust as needed Monitor renal function, cultures/sensitivites, and clinical progression   Height: 5\' 8"  (172.7 cm) Weight: 91 kg (200 lb 9.9 oz) IBW/kg (Calculated) : 68.4  Temp (24hrs), Avg:97.6 F (36.4 C), Min:96.6 F (35.9 C), Max:98.1 F (36.7 C)  Recent Labs  Lab 06/15/20 0016  WBC 19.4*  CREATININE 2.29*  2.25*    Estimated Creatinine Clearance: 29.6 mL/min (A) (by C-G formula based on SCr of 2.25 mg/dL (H)).    Allergies  Allergen Reactions  . Diltiazem Other (See Comments)    Unknown  . Lovastatin Other (See Comments)    Unknown  . Proton Pump Inhibitors Other (See Comments)    Unknown   . Tramadol Nausea And Vomiting  . Codeine Rash  . Contrast Media [Iodinated Diagnostic Agents] Rash    Antimicrobials this admission: Unasyn 11/10 >>  Dose adjustments this admission: N/a  Microbiology results: 11/10 MRSA PCR: pending  11/10 Resp cx: pending   Thank you for allowing pharmacy to be a part of this patient's care.  Cristela Felt, PharmD Clinical Pharmacist  06/15/2020 11:06 AM

## 2020-06-15 NOTE — Procedures (Signed)
Cortrak  Person Inserting Tube:  Reyanne Hussar, Creola Corn, RD Tube Type:  Cortrak - 43 inches Tube Location:  Left nare Initial Placement:  Postpyloric Secured by: Bridle Technique Used to Measure Tube Placement:  Documented cm marking at nare/ corner of mouth Cortrak Secured At:  94 cm    Cortrak Tube Team Note:  Consult received to place a Cortrak feeding tube.      X-ray is required, abdominal x-ray has been ordered by the Cortrak team. Please confirm tube placement before using the Cortrak tube.   If the tube becomes dislodged please keep the tube and contact the Cortrak team at [email or web address] ([email or web address]) for replacement.  If after hours and replacement cannot be delayed, place a NG tube and confirm placement with an abdominal x-ray.    Larkin Ina, MS, RD, LDN RD pager number and weekend/on-call pager number located in [place].

## 2020-06-15 NOTE — Progress Notes (Signed)
PT Cancellation Note  Patient Details Name: JOHAAN RYSER MRN: [id number] DOB: [birth year]   Cancelled Treatment:    Reason Eval/Treat Not Completed: Medical issues which prohibited therapy.  Newly intubated this morning.  PT will hold for today and check on tomorrow.  Thanks,  Verdene Lennert, PT, DPT  Acute Rehabilitation [phone] pager #[phone] office       Barbarann Ehlers Keyonte [place]ston 06/15/2020, 12:38 PM

## 2020-06-15 NOTE — Plan of Care (Cosign Needed)
Patient is intubated and sedated. Unable to progress with current care plan.   Problem: Education: Goal: Knowledge of disease or condition will improve Outcome: Not Progressing Goal: Knowledge of secondary prevention will improve Outcome: Not Progressing Goal: Knowledge of patient specific risk factors addressed and post discharge goals established will improve Outcome: Not Progressing   Problem: Coping: Goal: Will verbalize positive feelings about self Outcome: Not Progressing Goal: Will identify appropriate support needs Outcome: Not Progressing   Problem: Self-Care: Goal: Ability to participate in self-care as condition permits will improve Outcome: Not Progressing

## 2020-06-15 NOTE — Progress Notes (Signed)
  Echocardiogram 2D Echocardiogram was attempted but  patient was in MRI. We will try again as the schedule permits.   Jennette Dubin 06/15/2020, 1:55 PM

## 2020-06-15 NOTE — Procedures (Signed)
Central Venous Catheter Insertion Procedure Note  KADEEM HYLE  [id number]  April 17, 1941  Date:06/15/20  Time:11:38 AM   Provider Performing:Brooke Moshe Cipro   Procedure: Insertion of Non-tunneled Central Venous [phone]) with US guidance (18563)   Indication(s) Medication administration and Difficult access  Consent Risks of the procedure as well as the alternatives and risks of each were explained to the patient and/or caregiver.  Consent for the procedure was obtained and is signed in the bedside chart  Anesthesia Topical only with 1% lidocaine   Timeout Verified patient identification, verified procedure, site/side was marked, verified correct patient position, special equipment/implants available, medications/allergies/relevant history reviewed, required imaging and test results available.  Sterile Technique Maximal sterile technique including full sterile barrier drape, hand hygiene, sterile gown, sterile gloves, mask, hair covering, sterile ultrasound probe cover (if used).  Procedure Description Area of catheter insertion was cleaned with chlorhexidine and draped in sterile fashion.  With real-time ultrasound guidance a central venous catheter was placed into the right internal jugular vein to 16 cm.  Nonpulsatile blood flow and easy flushing noted in all ports.  The catheter was sutured in place, biopatch placed, and sterile dressing applied.     Complications/Tolerance None; patient tolerated the procedure well. Chest X-ray is ordered to verify placement for internal jugular cannulation.     EBL Minimal  Specimen(s) None   Kennieth Rad, ACNP [facility] 06/15/2020, 11:39 AM  See Amion for personal pager PCCM on call pager [phone]

## 2020-06-15 NOTE — Progress Notes (Signed)
STROKE TEAM PROGRESS NOTE   INTERVAL HISTORY I have personally reviewed history of presenting illness, electronic medical records and imaging films in PACS. He was transferred from [facility] through 4 concern for potential basilar artery thrombosis/occlusion and left occipital and cerebellar hypodensity and concerns for large infarct. MRA showed patent basilar artery. Overnight he had decrease in mental status with inability to protect airway requiring intubation early this morning. Is currently on ventilatory support for respiratory failure. He is Covid positive but has received vaccination.  Vitals:   06/15/20 0800 06/15/20 0900 06/15/20 0905 06/15/20 1000  BP: 137/78 135/77  119/71  Pulse: 84 82  68  Resp: 19 20  15   Temp:      TempSrc:      SpO2: 95%  100% 100%  Weight:      Height:   5\' 8"  (1.727 m)    CBC:  Recent Labs  Lab 06/15/20 0016 06/15/20 0746  WBC 19.4*  --   HGB 12.0* 10.9*  HCT 36.8* 32.0*  MCV 89.5  --   PLT 229  --    Basic Metabolic Panel:  Recent Labs  Lab 06/15/20 0016 06/15/20 0746  NA 140 143  K 4.6 4.6  CL 109  --   CO2 16*  --   GLUCOSE 213*  --   BUN 61*  --   CREATININE 2.29*  2.25*  --   CALCIUM 8.5*  --    Lipid Panel:  Recent Labs  Lab 06/15/20 0016  CHOL 83  TRIG 63  HDL 28*  CHOLHDL 3.0  VLDL 13  LDLCALC 42   HgbA1c:  Recent Labs  Lab 06/15/20 0016  HGBA1C 7.7*   Urine Drug Screen: No results for input(s): LABOPIA, COCAINSCRNUR, LABBENZ, AMPHETMU, THCU, LABBARB in the last 168 hours.  Alcohol Level No results for input(s): ETH in the last 168 hours.  IMAGING past 24 hours MR ANGIO HEAD WO CONTRAST  Result Date: 06/15/2020 CLINICAL DATA:  Posterior circulation infarcts EXAM: MRA HEAD WITHOUT CONTRAST TECHNIQUE: Angiographic images of the Circle of Willis were obtained using MRA technique without intravenous contrast. COMPARISON:  Brain MRI 06/11/2020 FINDINGS: POSTERIOR CIRCULATION: --Vertebral arteries: Normal  --Inferior cerebellar arteries: Poor visualization due to motion. --Basilar artery: Patent. --Superior cerebellar arteries: Normal. --Posterior cerebral arteries: Right P1 and proximal P2 segments are patent. More distally assessment is limited by motion. On the left, the PCA is occluded at the P1 P2 junction. ANTERIOR CIRCULATION: --Intracranial internal carotid arteries: Normal. --Anterior cerebral arteries (ACA): Normal. --Middle cerebral arteries (MCA): Bilaterally, the M1 and proximal M2 segments are patent. ANATOMIC VARIANTS: None IMPRESSION: 1. 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 2. Motion degraded examination, but the basilar artery is patent. Electronically Signed   By: Ulyses Jarred M.D.   On: 06/15/2020 02:58   MR BRAIN WO CONTRAST  Result Date: 06/15/2020 CLINICAL DATA:  Stroke follow-up EXAM: MRI HEAD WITHOUT CONTRAST TECHNIQUE: Multiplanar, multiecho pulse sequences of the brain and surrounding structures were obtained without intravenous contrast. COMPARISON:  None. FINDINGS: Brain: There is multifocal acute ischemia throughout both hemispheres, worst in the left PCA territory and left PICA territory. There is multifocal periventricular white matter hyperintensity, most often a result of chronic microvascular ischemia. There is generalized atrophy without lobar predilection. Vascular: Normal flow voids. Skull and upper cervical spine: Normal marrow signal. Sinuses/Orbits: Negative. Other: None. IMPRESSION: Multifocal acute ischemia throughout both hemispheres, worst in the left PCA territory and left PICA territory. No  hemorrhage or mass effect. Electronically Signed   By: Ulyses Jarred M.D.   On: 06/15/2020 01:10   Portable Chest x-ray  Result Date: 06/15/2020 CLINICAL DATA:  Endotracheal tube. EXAM: PORTABLE CHEST 1 VIEW COMPARISON:  June 13, 2020. FINDINGS: Stable cardiomediastinal silhouette. Endotracheal and nasogastric tubes appear to be in good position. No  pneumothorax or pleural effusion is noted. Stable patchy bilateral lung opacities are noted consistent with multifocal pneumonia. Bony thorax is unremarkable. IMPRESSION: Endotracheal and nasogastric tubes in good position. Stable patchy bilateral lung opacities consistent with multifocal pneumonia. Electronically Signed   By: Marijo Conception M.D.   On: 06/15/2020 09:38   DG Chest Port 1 View  Result Date: 06/06/2020 CLINICAL DATA:  COVID pneumonia EXAM: PORTABLE CHEST 1 VIEW COMPARISON:  June 12, 2020 FINDINGS: There are persistent diffuse hazy bilateral airspace opacities which have improved from prior study. There is cardiomegaly. The patient is status post prior CABG. There is no pneumothorax. No large pleural effusion. The superior most sternotomy wire is again fractured. IMPRESSION: Improving bilateral multifocal airspace opacities. Electronically Signed   By: Constance Holster M.D.   On: 06/09/2020 22:42    PHYSICAL EXAM Elderly Caucasian male who is sedated on Precedex drip and intubated and on ventilatory support. . Afebrile. Head is nontraumatic. Neck is supple without bruit.    Cardiac exam no murmur or gallop. Lungs are clear to auscultation. Distal pulses are well felt. Neurological Exam : Patient is sedated and intubated. Eyes are closed. Responds to sternal rub by partially opening eyes. Eyes are slightly disconjugate and skew. Pupils equal reactive. Doll's eye movements are sluggish. Fundi not visualized. Right lower facial weakness. Tongue midline. No spontaneous extremity movements but response to sternal rub lab purposeful movement in the left upper extremity. Withdraws left lower extremity. Trace withdrawal in the right lower extremity. Right upper extremity is flaccid with no withdrawal. Reflexes are depressed on the right present on the left. Right plantar upgoing left downgoing. ASSESSMENT/PLAN [name] is a 79 y.o. male with history of  DM2, CAD, HTN, HLD, PVC,  obesity, prior SDH, atrial fibrillation on Eliquis presenting to [facility] with 1 weeks hx SOB found to have COVID PNA following full vaccination.  CT showed L posterior infarct w/ concern for BA occlusion. Transferred to [facility] for further workup.    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 L PCA occlusion at P1/2  Repeat CT head posterior circulation infarcts, largest L PCA and L PICA - stable   MRA neck motion - B CCA, ICA and VAs patents. No aneurysms   2D Echo pending   LDL 42  HgbA1c 7.7  VTE prophylaxis - Heparin 5000 units sq tid   clopidogrel 75 mg daily and Eliquis (apixaban) daily prior to admission, now on aspirin 325 mg daily.   IV heparin  Drip if no bleed on Ct today   Therapy recommendations:  pending   Disposition:  pending   Acute Hypoxemia Respiratory Failure  COVID-19 PNA  Steroids, remdesivir, baricifinib, vitamins  Increasing O2 needs  Change in mental status   Intubated for airway protection 11/10  Sedated  CCM on board  Paroxysmal Atrial Fibrillation  Home anticoagulation:  clopidogrel  75 mg daily and Eliquis (apixaban) daily   On Pacerone 200 bid PTA ->100 daily   On Metoprolol 50 PTA   IV heparin  Drip till able to swallow then switch to Eliquis   Will need to continue long-term at d/c  Hypertension  Home meds:  norvasc 5, lotensin 40, lasix 40, hydralazine 75 bid, isosorbide 90 am+30pm, metoprolol 50  Stable . Permissive hypertension (OK if < 220/120) but gradually normalize in 5-7 days . Long-term BP goal normotensive  Hyperlipidemia  Home meds:  lipitor 40  Slightly elevated LFTs 64/40  Statin on hold  LDL 42, goal < 70  Continue statin once stable /  discharge  Diabetes type II Uncontrolled Neuropathy  Home meds:  levemir 44u bid, linzess 145, metformin 750 w/ B  HgbA1c 7.7, goal < 7.0  CBGs Recent Labs    06/28/2020 2359 06/15/20 0348 06/15/20 0734  GLUCAP 193* 191* 220*      SSI  AKI on CKD stage 3  Cre 2.25/2.29   Dysphagia . Secondary to stroke . NPO . Cortrak w/ TF  . Speech on board   Other Stroke Risk Factors  Advanced Age >/= 73   Obesity, Body mass index is 30.5 kg/m., BMI >/= 30 associated with increased stroke risk, recommend weight loss, diet and exercise as appropriate   Coronary artery disease s/p CABG 1997, stent 2017  Other Active Problems  Hx traumatic SDH  Hypothyroid on synthroid PTA, resumed  Hospital day # 1 Continue ventilatory support for respiratory failure as per CCM. Check CT scan of the head and if no hemorrhage or significant brainstem compression consider starting IV heparin to prevent further thromboembolism from his A. fib. Patient prognosis is guarded given Covid pneumonia as well as inability to protect airway from his posterior circulation strokes. I had a long discussion over the phone with the patient's daughter and explained his prognosis and plan of care and answered questions. Discussed with Dr. Elsworth Soho critical care medicine. This patient is critically ill and at significant risk of neurological worsening, death and care requires constant monitoring of vital signs, hemodynamics,respiratory and cardiac monitoring, extensive review of multiple databases, frequent neurological assessment, discussion with family, other specialists and medical decision making of high complexity.I have made any additions or clarifications directly to the above note.This critical care time does not reflect procedure time, or teaching time or supervisory time of PA/NP/Med Resident etc but could involve care discussion time.  I spent 35 minutes of neurocritical care time  in the care of  this patient.    Antony Contras, MD   To contact Stroke Continuity provider, please refer to [email or web address] After hours, contact General Neurology

## 2020-06-15 NOTE — Progress Notes (Signed)
  Echocardiogram 2D Echocardiogram has been performed.  Jennette Dubin 06/15/2020, 4:20 PM

## 2020-06-16 DIAGNOSIS — I639 Cerebral infarction, unspecified: Secondary | ICD-10-CM | POA: Diagnosis not present

## 2020-06-16 DIAGNOSIS — U071 COVID-19: Secondary | ICD-10-CM | POA: Diagnosis not present

## 2020-06-16 DIAGNOSIS — J80 Acute respiratory distress syndrome: Secondary | ICD-10-CM

## 2020-06-16 LAB — CBC
HCT: 47.7 % (ref 39.0–52.0)
Hemoglobin: 14.1 g/dL (ref 13.0–17.0)
MCH: 27.5 pg (ref 26.0–34.0)
MCHC: 29.6 g/dL — ABNORMAL LOW (ref 30.0–36.0)
MCV: 93.2 fL (ref 80.0–100.0)
Platelets: 200 10*3/uL (ref 150–400)
RBC: 5.12 MIL/uL (ref 4.22–5.81)
RDW: 15.9 % — ABNORMAL HIGH (ref 11.5–15.5)
WBC: 10.3 10*3/uL (ref 4.0–10.5)
nRBC: 0.2 % (ref 0.0–0.2)

## 2020-06-16 LAB — HEPARIN LEVEL (UNFRACTIONATED): Heparin Unfractionated: >2.2 IU/mL — ABNORMAL HIGH (ref 0.30–0.70)

## 2020-06-16 LAB — BASIC METABOLIC PANEL
Anion gap: 8 (ref 5–15)
BUN: 26 mg/dL — ABNORMAL HIGH (ref 8–23)
CO2: 33 mmol/L — ABNORMAL HIGH (ref 22–32)
Calcium: 8.6 mg/dL — ABNORMAL LOW (ref 8.9–10.3)
Chloride: 109 mmol/L (ref 98–111)
Creatinine, Ser: 0.98 mg/dL (ref 0.61–1.24)
GFR, Estimated: >60 mL/min (ref >60)
Glucose, Bld: 244 mg/dL — ABNORMAL HIGH (ref 70–99)
Potassium: 4.8 mmol/L (ref 3.5–5.1)
Sodium: 150 mmol/L — ABNORMAL HIGH (ref 135–145)

## 2020-06-16 LAB — GLUCOSE, CAPILLARY
Glucose-Capillary: 114 mg/dL — ABNORMAL HIGH (ref 70–99)
Glucose-Capillary: 83 mg/dL (ref 70–99)
Glucose-Capillary: 132 mg/dL — ABNORMAL HIGH (ref 70–99)
Glucose-Capillary: 181 mg/dL — ABNORMAL HIGH (ref 70–99)
Glucose-Capillary: 253 mg/dL — ABNORMAL HIGH (ref 70–99)
Glucose-Capillary: 236 mg/dL — ABNORMAL HIGH (ref 70–99)

## 2020-06-16 LAB — PHOSPHORUS: Phosphorus: 2.6 mg/dL (ref 2.5–4.6)

## 2020-06-16 LAB — APTT: aPTT: >200 seconds (ref 24–36)

## 2020-06-16 LAB — MAGNESIUM: Magnesium: 2.3 mg/dL (ref 1.7–2.4)

## 2020-06-16 MED ORDER — METOPROLOL TARTRATE 25 MG/10 ML ORAL SUSPENSION
25.0000 mg | Freq: Two times a day (BID) | ORAL | Status: DC
  Administered 2020-06-16 – 2020-06-18 (×5): 25 mg
  Filled 2020-06-16 (×7): qty 10

## 2020-06-16 MED ORDER — VITAL 1.5 CAL PO LIQD
1000.0000 mL | ORAL | Status: DC
  Administered 2020-06-16 – 2020-06-18 (×3): 1000 mL
  Filled 2020-06-16 (×2): qty 1000

## 2020-06-16 MED ORDER — INSULIN ASPART 100 UNIT/ML ~~LOC~~ SOLN
0.0000 [IU] | SUBCUTANEOUS | Status: DC
  Administered 2020-06-16: 2 [IU] via SUBCUTANEOUS
  Administered 2020-06-16: 5 [IU] via SUBCUTANEOUS
  Administered 2020-06-16: 1 [IU] via SUBCUTANEOUS
  Administered 2020-06-16: 3 [IU] via SUBCUTANEOUS
  Administered 2020-06-17: 7 [IU] via SUBCUTANEOUS
  Administered 2020-06-17: 2 [IU] via SUBCUTANEOUS
  Administered 2020-06-17: 5 [IU] via SUBCUTANEOUS
  Administered 2020-06-17: 3 [IU] via SUBCUTANEOUS
  Administered 2020-06-17: 2 [IU] via SUBCUTANEOUS
  Administered 2020-06-17: 5 [IU] via SUBCUTANEOUS
  Administered 2020-06-18: 7 [IU] via SUBCUTANEOUS

## 2020-06-16 MED ORDER — PROSOURCE PLUS PO LIQD
30.0000 mL | Freq: Two times a day (BID) | ORAL | Status: DC
  Filled 2020-06-16 (×2): qty 30

## 2020-06-16 MED ORDER — BARICITINIB 1 MG PO TABS
4.0000 mg | ORAL_TABLET | Freq: Every day | ORAL | Status: DC
  Administered 2020-06-16 – 2020-06-17 (×2): 4 mg
  Filled 2020-06-16 (×2): qty 4

## 2020-06-16 MED ORDER — VITAL HIGH PROTEIN PO LIQD
1000.0000 mL | ORAL | Status: DC
  Administered 2020-06-16: 1000 mL

## 2020-06-16 MED ORDER — SODIUM CHLORIDE 0.9% FLUSH
10.0000 mL | Freq: Two times a day (BID) | INTRAVENOUS | Status: DC
  Administered 2020-06-17 – 2020-06-18 (×4): 10 mL

## 2020-06-16 MED ORDER — METHYLPREDNISOLONE SODIUM SUCC 40 MG IJ SOLR: 20.0000 mg | Freq: Every day | INTRAMUSCULAR | Status: DC

## 2020-06-16 MED ORDER — METOPROLOL TARTRATE 25 MG/10 ML ORAL SUSPENSION
12.5000 mg | Freq: Two times a day (BID) | ORAL | Status: DC
  Filled 2020-06-16 (×2): qty 5

## 2020-06-16 MED ORDER — SODIUM CHLORIDE 0.9 % IV SOLN
3.0000 g | Freq: Four times a day (QID) | INTRAVENOUS | Status: DC
  Administered 2020-06-16 – 2020-06-17 (×4): 3 g via INTRAVENOUS
  Filled 2020-06-16 (×4): qty 8

## 2020-06-16 MED ORDER — SODIUM CHLORIDE 0.9% FLUSH: 10.0000 mL | INTRAVENOUS | Status: DC | PRN

## 2020-06-16 MED ORDER — HEPARIN (PORCINE) 25000 UT/250ML-% IV SOLN: 800.0000 [IU]/h | INTRAVENOUS | Status: DC

## 2020-06-16 MED ORDER — METHYLPREDNISOLONE SODIUM SUCC 40 MG IJ SOLR
40.0000 mg | Freq: Two times a day (BID) | INTRAMUSCULAR | Status: AC
  Administered 2020-06-17 (×2): 40 mg via INTRAVENOUS
  Filled 2020-06-16 (×2): qty 1

## 2020-06-16 MED ORDER — HEPARIN (PORCINE) 25000 UT/250ML-% IV SOLN
1100.0000 [IU]/h | INTRAVENOUS | Status: DC
  Administered 2020-06-16: 1100 [IU]/h via INTRAVENOUS
  Filled 2020-06-16: qty 250

## 2020-06-16 MED ORDER — METHYLPREDNISOLONE SODIUM SUCC 40 MG IJ SOLR
20.0000 mg | Freq: Two times a day (BID) | INTRAMUSCULAR | Status: DC
  Filled 2020-06-16: qty 1

## 2020-06-16 MED ORDER — METHYLPREDNISOLONE SODIUM SUCC 40 MG IJ SOLR
30.0000 mg | Freq: Two times a day (BID) | INTRAMUSCULAR | Status: AC
  Administered 2020-06-18 (×2): 30 mg via INTRAVENOUS
  Filled 2020-06-16 (×2): qty 1

## 2020-06-16 MED ORDER — FREE WATER
200.0000 mL | Status: DC
  Administered 2020-06-16 – 2020-06-19 (×17): 200 mL

## 2020-06-16 NOTE — Progress Notes (Signed)
ANTICOAGULATION CONSULT NOTE - Initial Consult  Pharmacy Consult for heparin Indication: atrial fibrillation and stroke  Allergies  Allergen Reactions  . Diltiazem Other (See Comments)    Unknown  . Lovastatin Other (See Comments)    Unknown  . Proton Pump Inhibitors Other (See Comments)    Unknown   . Tramadol Nausea And Vomiting  . Codeine Rash  . Contrast Media [Iodinated Diagnostic Agents] Rash    Patient Measurements: Height: '5\' 8"'  (172.7 cm) Weight: 84.4 kg (186 lb 1.1 oz) IBW/kg (Calculated) : 68.4 Heparin Dosing Weight: 87.2 kg  Vital Signs: Temp: 98.7 F (37.1 C) (11/11 0800) Temp Source: Axillary (11/11 0800) BP: 154/90 (11/11 0800) Pulse Rate: 109 (11/11 0800)  Labs: Recent Labs    06/15/20 0016 06/15/20 0016 06/15/20 0746 06/15/20 0746 06/15/20 1024 06/16/20 0424  HGB 12.0*   < > 10.9*   < > 9.9* 14.1  HCT 36.8*   < > 32.0*  --  29.0* 47.7  PLT 229  --   --   --   --  200  CREATININE 2.29*  2.25*  --   --   --   --  0.98   < > = values in this interval not displayed.    Estimated Creatinine Clearance: 65.7 mL/min (by C-G formula based on SCr of 0.98 mg/dL).   Medical History: Past Medical History:  Diagnosis Date  . Arthritis   . Chronic kidney disease    CKD STAGE 3;  CHRONIC RENAL INSUFFICIENCY  . Coronary artery disease   . Diabetes mellitus   . Groin hematoma 02/19/12   RIGHT  . Hyperlipidemia   . Hypertension   . Neuropathy due to type 2 diabetes mellitus ([place])   . Obesity   . PVC (premature ventricular contraction)   . SDH (subdural hematoma) ([place]) 02/06/2013   R parafalcine SDH after a fall, rx at [facility]    Medications:  Medications Prior to Admission  Medication Sig Dispense Refill Last Dose  . acetaminophen (TYLENOL) 500 MG tablet Take 500 mg by mouth every 6 (six) hours as needed.   Unknown  . amiodarone (PACERONE) 200 MG tablet TAKE 1 TABLET(200 MG) BY MOUTH TWICE DAILY 180 tablet 2 06/08/2020 at Unknown time  .  atorvastatin (LIPITOR) 40 MG tablet TAKE 1 TABLET(40 MG) BY MOUTH DAILY 90 tablet 0 06/08/2020  . benazepril (LOTENSIN) 40 MG tablet Take 1 tablet by mouth daily.   06/08/2020  . cholecalciferol (VITAMIN D) 1000 UNITS tablet Take 1,000 Units by mouth daily.   06/08/2020  . clopidogrel (PLAVIX) 75 MG tablet TAKE 1 TABLET(75 MG) BY MOUTH DAILY WITH BREAKFAST 90 tablet 3 06/08/2020  . ELIQUIS 5 MG TABS tablet TAKE 1 TABLET(5 MG) BY MOUTH TWICE DAILY 180 tablet 2 06/08/2020  . insulin aspart (NOVOLOG) 100 UNIT/ML FlexPen Inject 10-20 Units into the skin 2 (two) times daily with a meal.    06/08/2020  . isosorbide mononitrate (IMDUR) 60 MG 24 hr tablet TAKE 1 AND 1/2 TABLETS BY MOUTH EVERY AM AND TAKE 0.5 TABLET BY MOUTH EVERY EVENING 180 tablet 3 06/08/2020  . Lancets (ONETOUCH DELICA PLUS [id number]) MISC USE AS DIRECTED TO TEST BLOOD GLUCOSE BID     . metFORMIN (GLUCOPHAGE-XR) 750 MG 24 hr tablet Take 750 mg by mouth daily with breakfast.   06/08/2020  . metoprolol succinate (TOPROL-XL) 50 MG 24 hr tablet TAKE 1 TABLET(50 MG) BY MOUTH DAILY 90 tablet 1 06/15/2020 at Unknown time  . NOVOFINE AUTOCOVER 30G  X 8 MM MISC      . ONE TOUCH ULTRA TEST test strip   4   . TRULICITY 1.5 FY/1.0FB SOPN Inject 1.5 mg as directed once a week.   12 05/25/2020  . amLODipine (NORVASC) 5 MG tablet Take 1 tablet (5 mg total) by mouth daily. 90 tablet 3 06/08/2020  . DUREZOL 0.05 % EMUL      . furosemide (LASIX) 40 MG tablet Take one tablet (40 mg) by mouth every morning and half a tablet (20 mg) by mouth EVERY afternoon. 135 tablet 6   . hydrALAZINE (APRESOLINE) 50 MG tablet TAKE 1 AND 1/2 TABLETS(75 MG) BY MOUTH TWICE DAILY (Patient taking differently: 50 mg 3 (three) times daily. ) 180 tablet 2 06/08/2020  . ketoconazole (NIZORAL) 2 % cream Apply 1 fingertip amount to each foot daily. 30 g 0   . LEVEMIR FLEXTOUCH 100 UNIT/ML Pen Inject 44 Units into the skin 2 (two) times daily.   12 06/08/2020  . levothyroxine (SYNTHROID) 150  MCG tablet Take 175 mcg by mouth every morning.    06/08/2020  . LINZESS 145 MCG CAPS capsule Take 145 mcg by mouth daily before breakfast.   11   . Multiple Vitamin (MULTIVITAMIN WITH MINERALS) TABS Take 1 tablet by mouth daily.   06/08/2020  . nitroGLYCERIN (NITROSTAT) 0.4 MG SL tablet Place 1 tablet (0.4 mg total) under the tongue every 5 (five) minutes as needed. For chest pain 25 tablet 12   . ofloxacin (OCUFLOX) 0.3 % ophthalmic solution      . oxybutynin (DITROPAN) 5 MG tablet Take 1 tablet by mouth daily.     . potassium chloride SA (KLOR-CON) 20 MEQ tablet TAKE 1 TABLET BY MOUTH DAILY AS NEEDED 90 tablet 1   . vitamin C (ASCORBIC ACID) 500 MG tablet Take 500 mg by mouth daily.      Scheduled:  .  stroke: mapping our early stages of recovery book   Does not apply Once  . amiodarone  100 mg Per Tube Daily  . baricitinib  4 mg Per Tube Daily  . chlorhexidine gluconate (MEDLINE KIT)  15 mL Mouth Rinse BID  . Chlorhexidine Gluconate Cloth  6 each Topical Daily  . docusate  100 mg Per Tube BID  . famotidine  20 mg Per Tube Daily  . feeding supplement (VITAL HIGH PROTEIN)  1,000 mL Per Tube Q24H  . insulin aspart  0-15 Units Subcutaneous Q4H  . insulin detemir  10 Units Subcutaneous BID  . levothyroxine  175 mcg Per Tube Q0600  . mouth rinse  15 mL Mouth Rinse 10 times per day  . methylPREDNISolone (SOLU-MEDROL) injection  50 mg Intravenous BID  . [START ON 06/17/2020] methylPREDNISolone (SOLU-MEDROL) injection  40 mg Intravenous Q12H   Followed by  . [START ON 06/18/2020] methylPREDNISolone (SOLU-MEDROL) injection  30 mg Intravenous Q12H   Followed by  . [START ON 06-22-2020] methylPREDNISolone (SOLU-MEDROL) injection  20 mg Intravenous Q12H   Followed by  . [START ON 06/20/2020] methylPREDNISolone (SOLU-MEDROL) injection  20 mg Intravenous Daily  . polyethylene glycol  17 g Per Tube Daily   Infusions:  . ampicillin-sulbactam (UNASYN) IV     PRN: acetaminophen **OR** acetaminophen  (TYLENOL) oral liquid 160 mg/5 mL **OR** acetaminophen, fentaNYL (SUBLIMAZE) injection, fentaNYL (SUBLIMAZE) injection, ipratropium-albuterol, senna-docusate   Anti-infectives (From admission, onward)   Start     Dose/Rate Route Frequency Ordered Stop   06/16/20 1000  Ampicillin-Sulbactam (UNASYN) 3 g in sodium chloride 0.9 %  100 mL IVPB        3 g 200 mL/hr over 30 Minutes Intravenous Every 6 hours 06/16/20 0900     06/15/20 1130  Ampicillin-Sulbactam (UNASYN) 3 g in sodium chloride 0.9 % 100 mL IVPB  Status:  Discontinued        3 g 200 mL/hr over 30 Minutes Intravenous Every 12 hours 06/15/20 1117 06/16/20 0900      Assessment: 79 yo male presents from an [place] with concern for stroke and is also COVID-19. PTA the patient is on apixaban for atrial fibrillation. The patient also takes clopidogrel PTA which is currently on hold. Apixaban is on hold and pharmacy is consulted to dose heparin.  The patient's exact last dose of apixaban is unknown, however, it was held at the [facility] and it is unlikely that apixaban will affect the heparin level. Will monitor therapy using heparin levels for now. CBC and platelets are stable.    Goal of Therapy:  Heparin level 0.3-0.5 units/ml Monitor platelets by anticoagulation protocol: Yes   Plan:  Initiate heparin IV 1100 units/hour Discontinue both aspirin 325 mg daily and heparin 5000 units SubQ Obtain 8 hour heparin level Monitor for signs and symptoms of bleeding Monitor daily CBC and heparin level   Shauna Hugh, PharmD, [place]  PGY-1 Pharmacy Resident 06/16/2020 9:27 AM  Please check [email or web address] for unit-specific pharmacy phone numbers.

## 2020-06-16 NOTE — Progress Notes (Signed)
PT Cancellation Note  Patient Details Name: Roberto Haley MRN: [id number] DOB: September 09, 1940   Cancelled Treatment:    Reason Eval/Treat Not Completed: Medical issues which prohibited therapy..  Per RN, not ready for PT today.  Pt remains intubated, MD note indicates trials of PS/wean, remains on bedrest, so will need that removed as well to proceed with therapy eval.  PT to check back tomorrow to monitor for medical stability.  Thanks,  Verdene Lennert, PT, DPT  Acute Rehabilitation [phone] pager #[phone] office       Roberto Haley 06/16/2020, 12:36 PM

## 2020-06-16 NOTE — Progress Notes (Signed)
OT Cancellation Note  Patient Details Name: Roberto Haley MRN: [id number] DOB: [birth year]   Cancelled Treatment:    Reason Eval/Treat Not Completed: Medical issues which prohibited therapy;Patient not medically ready (Intubated. Sedated. Will hold and return as pt medically ready.)  [place], OTR/L Acute Rehab Pager: [phone] Office: [phone] 06/16/2020, 7:39 AM

## 2020-06-16 NOTE — Progress Notes (Signed)
STROKE TEAM PROGRESS NOTE   INTERVAL HISTORY Patient remains on ventilatory support for respiratory failure due to Covid related ARDS and inability to protect airway from his posterior circulation strokes.  He is not on any sedation but remains very poorly responsive.  He is currently on pressure support and weaning off ventilator support this morning.  Vital signs stable.  He has been started on IV heparin drip  Vitals:   06/16/20 0700 06/16/20 0745 06/16/20 0800 06/16/20 1242  BP: (!) 143/92  (!) 154/90   Pulse: 97 (!) 118 (!) 109   Resp: 15 16 14    Temp:   98.7 F (37.1 C)   TempSrc:   Axillary   SpO2: 100% 100% 100% 100%  Weight:      Height:       CBC:  Recent Labs  Lab 06/15/20 0016 06/15/20 0746 06/15/20 1024 06/16/20 0424  WBC 19.4*  --   --  10.3  HGB 12.0*   < > 9.9* 14.1  HCT 36.8*   < > 29.0* 47.7  MCV 89.5  --   --  93.2  PLT 229  --   --  200   < > = values in this interval not displayed.   Basic Metabolic Panel:  Recent Labs  Lab 06/15/20 0016 06/15/20 0746 06/15/20 1024 06/16/20 0424  NA 140   < > 143 150*  K 4.6   < > 4.5 4.8  CL 109  --   --  109  CO2 16*  --   --  33*  GLUCOSE 213*  --   --  244*  BUN 61*  --   --  26*  CREATININE 2.29*  2.25*  --   --  0.98  CALCIUM 8.5*  --   --  8.6*  MG  --   --   --  2.3  PHOS  --   --   --  2.6   < > = values in this interval not displayed.   Lipid Panel:  Recent Labs  Lab 06/15/20 0016  CHOL 83  TRIG 63  HDL 28*  CHOLHDL 3.0  VLDL 13  LDLCALC 42   HgbA1c:  Recent Labs  Lab 06/15/20 0016  HGBA1C 7.7*   Urine Drug Screen: No results for input(s): LABOPIA, COCAINSCRNUR, LABBENZ, AMPHETMU, THCU, LABBARB in the last 168 hours.  Alcohol Level No results for input(s): ETH in the last 168 hours.  IMAGING past 24 hours ECHOCARDIOGRAM COMPLETE  Result Date: 06/15/2020    ECHOCARDIOGRAM REPORT   Patient Name:   TRE SANKER Date of Exam: 06/15/2020 Medical Rec #:  [id number]      Height:        68.0 in Accession #:    [id number]     Weight:       200.6 lb Date of Birth:  1941/02/18      BSA:          2.046 m Patient Age:    79 years       BP:           118/95 mmHg Patient Gender: M              HR:           60 bpm. Exam Location:  Inpatient Procedure: 2D Echo and Intracardiac Opacification Agent Indications:    Stroke I163.9  History:        Patient has prior history of Echocardiogram examinations, most  recent 05/28/2018. CAD, Prior CABG, COVID-19 Positive; Risk                 Factors:Hypertension, Dyslipidemia and Diabetes.  Sonographer:    Mikki Santee RDCS (AE) Referring Phys: [id number] [place]  1. Left ventricular ejection fraction, by estimation, is 40 to 45%. The left ventricle has mildly decreased function. The left ventricle demonstrates global hypokinesis. There is mild left ventricular hypertrophy. Left ventricular diastolic parameters are indeterminate.  2. Right ventricular systolic function is normal. The right ventricular size is normal. There is mildly elevated pulmonary artery systolic pressure. The estimated right ventricular systolic pressure is 41.6 mmHg.  3. Left atrial size was moderately dilated.  4. Mitral valve regurgitation may be underestimated due to "splay" artifact. The mitral valve is degenerative. Moderate mitral valve regurgitation. No evidence of mitral stenosis.  5. Tricuspid valve regurgitation is moderate.  6. The aortic valve is calcified. There is moderate calcification of the aortic valve. There is moderate thickening of the aortic valve. Aortic valve regurgitation is mild. Mild to moderate aortic valve sclerosis/calcification is present, without any evidence of aortic stenosis.  7. The inferior vena cava is dilated in size with <50% respiratory variability, suggesting right atrial pressure of 15 mmHg. Comparison(s): Prior images unable to be directly viewed, comparison made by report only. No significant change from prior  study. Prior EF 45%. Conclusion(s)/Recommendation(s): No intracardiac source of embolism detected on this transthoracic study. A transesophageal echocardiogram is recommended to exclude cardiac source of embolism if clinically indicated. FINDINGS  Left Ventricle: Left ventricular ejection fraction, by estimation, is 40 to 45%. The left ventricle has mildly decreased function. The left ventricle demonstrates global hypokinesis. Definity contrast agent was given IV to delineate the left ventricular  endocardial borders. The left ventricular internal cavity size was normal in size. There is mild left ventricular hypertrophy. Left ventricular diastolic parameters are indeterminate. Right Ventricle: The right ventricular size is normal. No increase in right ventricular wall thickness. Right ventricular systolic function is normal. There is mildly elevated pulmonary artery systolic pressure. The tricuspid regurgitant velocity is 2.38  m/s, and with an assumed right atrial pressure of 15 mmHg, the estimated right ventricular systolic pressure is 60.6 mmHg. Left Atrium: Left atrial size was moderately dilated. Right Atrium: Right atrial size was normal in size. Pericardium: There is no evidence of pericardial effusion. Mitral Valve: Mitral valve regurgitation may be underestimated due to "splay" artifact. The mitral valve is degenerative in appearance. Moderate mitral valve regurgitation, with eccentric anteriorly directed jet. No evidence of mitral valve stenosis. Tricuspid Valve: The tricuspid valve is normal in structure. Tricuspid valve regurgitation is moderate . No evidence of tricuspid stenosis. Aortic Valve: The aortic valve is calcified. There is moderate calcification of the aortic valve. There is moderate thickening of the aortic valve. Aortic valve regurgitation is mild. Aortic regurgitation PHT measures 665 msec. Mild to moderate aortic valve sclerosis/calcification is present, without any evidence of aortic  stenosis. Pulmonic Valve: The pulmonic valve was normal in structure. Pulmonic valve regurgitation is mild. No evidence of pulmonic stenosis. Aorta: The aortic root is normal in size and structure. Venous: The inferior vena cava is dilated in size with less than 50% respiratory variability, suggesting right atrial pressure of 15 mmHg. IAS/Shunts: No atrial level shunt detected by color flow Doppler.  LEFT VENTRICLE PLAX 2D LVIDd:         4.80 cm LVIDs:         4.20 cm LV PW:  1.20 cm LV IVS:        1.20 cm LVOT diam:     2.40 cm LV SV:         59 LV SV Index:   29 LVOT Area:     4.52 cm  LEFT ATRIUM              Index       RIGHT ATRIUM           Index LA diam:        4.50 cm  2.20 cm/m  RA Area:     24.10 cm LA Vol (A2C):   110.0 ml 53.75 ml/m RA Volume:   69.50 ml  33.96 ml/m LA Vol (A4C):   49.1 ml  23.99 ml/m LA Biplane Vol: 75.6 ml  36.94 ml/m  AORTIC VALVE LVOT Vmax:   59.20 cm/s LVOT Vmean:  37.300 cm/s LVOT VTI:    0.131 m AI PHT:      665 msec  AORTA Ao Root diam: 3.50 cm TRICUSPID VALVE TR Peak grad:   22.7 mmHg TR Vmax:        238.00 cm/s  SHUNTS Systemic VTI:  0.13 m Systemic Diam: 2.40 cm Candee Furbish MD Electronically signed by Candee Furbish MD Signature Date/Time: 06/15/2020/5:16:22 PM    Final     PHYSICAL EXAM Elderly Caucasian male who is not sedated and is  intubated and on ventilatory support. . Afebrile. Head is nontraumatic. Neck is supple without bruit.    Cardiac exam no murmur or gallop. Lungs are clear to auscultation. Distal pulses are well felt. Neurological Exam : Patient is intubated. Eyes are closed. Responds to sternal rub by partially opening eyes. Eyes are slightly dysconjugate and skew. Pupils equal reactive. Doll's eye movements are sluggish. Fundi not visualized. Right lower facial weakness. Tongue midline. No spontaneous extremity movements but response to sternal rub lab purposeful movement in the left upper extremity. Withdraws left lower extremity  semipurposefully. Trace withdrawal in the right lower extremity. Right upper extremity is flaccid with no withdrawal. Reflexes are depressed on the right present on the left. Right plantar upgoing left downgoing. ASSESSMENT/PLAN Mr. DELWIN RACZKOWSKI is a 79 y.o. male with history of  DM2, CAD, HTN, HLD, PVC, obesity, prior SDH, atrial fibrillation on Eliquis presenting to [facility] with 1 weeks hx SOB found to have COVID PNA following full vaccination.  CT showed L posterior infarct w/ concern for BA occlusion. Transferred to [facility] for further workup.    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 L PCA occlusion at P1/2  Repeat CT head posterior circulation infarcts, largest L PCA and L PICA - stable   MRA neck motion - B CCA, ICA and VAs patents. No aneurysms   2D Echo pending   LDL 42  HgbA1c 7.7  VTE prophylaxis - Heparin 5000 units sq tid   clopidogrel 75 mg daily and Eliquis (apixaban) daily prior to admission, now on aspirin 325 mg daily.   IV heparin  Drip if no bleed on Ct today   Therapy recommendations:  pending   Disposition:  pending   Acute Hypoxemia Respiratory Failure  COVID-19 PNA  Steroids, remdesivir, baricifinib, vitamins  Increasing O2  needs  Change in mental status   Intubated for airway protection 11/10  Sedated  CCM on board  Paroxysmal Atrial Fibrillation  Home anticoagulation:  clopidogrel 75 mg daily and Eliquis (apixaban) daily   On Pacerone 200 bid PTA ->100 daily   On Metoprolol 50 PTA   IV heparin  Drip till able to swallow then switch to Eliquis   Will need to continue long-term at d/c  Hypertension  Home meds:  norvasc 5, lotensin 40, lasix 40, hydralazine 75 bid,  isosorbide 90 am+30pm, metoprolol 50  Stable . Permissive hypertension (OK if < 220/120) but gradually normalize in 5-7 days . Long-term BP goal normotensive  Hyperlipidemia  Home meds:  lipitor 40  Slightly elevated LFTs 64/40  Statin on hold  LDL 42, goal < 70  Continue statin once stable / discharge  Diabetes type II Uncontrolled Neuropathy  Home meds:  levemir 44u bid, linzess 145, metformin 750 w/ B  HgbA1c 7.7, goal < 7.0  CBGs Recent Labs    06/16/20 0327 06/16/20 0845 06/16/20 1210  GLUCAP 114* 83 132*      SSI  AKI on CKD stage 3  Cre 2.25/2.29   Dysphagia . Secondary to stroke . NPO . Cortrak w/ TF  . Speech on board   Other Stroke Risk Factors  Advanced Age >/= 15   Obesity, Body mass index is 28.29 kg/m., BMI >/= 30 associated with increased stroke risk, recommend weight loss, diet and exercise as appropriate   Coronary artery disease s/p CABG 1997, stent 2017  Other Active Problems  Hx traumatic SDH  Hypothyroid on synthroid PTA, resumed  Hospital day # 2 Continue ventilatory support for respiratory failure and wean as per CCM.  Continue IV heparin drip stroke protocol to prevent further thromboembolism from his A. fib. Patient prognosis is guarded given Covid pneumonia as well as inability to protect airway from his posterior circulation strokes. I had a long discussion over the phone with the patient's daughter yesterday and explained his prognosis and plan of care and answered questions.  If patient's condition does not improve significantly over the next few days family may need to make long-term decisions about trach/PEG and nursing home.  Discussed with Dr. Tacy Learn critical care medicine. This patient is critically ill and at significant risk of neurological worsening, death and care requires constant monitoring of vital signs, hemodynamics,respiratory and cardiac monitoring, extensive review of multiple databases, frequent neurological  assessment, discussion with family, other specialists and medical decision making of high complexity.I have made any additions or clarifications directly to the above note.This critical care time does not reflect procedure time, or teaching time or supervisory time of PA/NP/Med Resident etc but could involve care discussion time.  I spent 30 minutes of neurocritical care time  in the care of  this patient.   Antony Contras, MD   To contact Stroke Continuity provider, please refer to [email or web address] After hours, contact General Neurology

## 2020-06-16 NOTE — Progress Notes (Signed)
PHARMACY NOTE:  ANTIMICROBIAL RENAL DOSAGE ADJUSTMENT  Current antimicrobial regimen includes a mismatch between antimicrobial dosage and estimated renal function.  As per policy approved by the Pharmacy & Therapeutics and Medical Executive Committees, the antimicrobial dosage will be adjusted accordingly.  Current antimicrobial dosage:  Unasyn 3g q12h and baricitinib  Indication: aspiration pneumonia and COVID-19  Renal Function:  Estimated Creatinine Clearance: 65.7 mL/min (by C-G formula based on SCr of 0.98 mg/dL). []      On intermittent HD, scheduled: []      On CRRT    Antimicrobial dosage has been changed to: Unasyn 3g q6h and baricitinib 4 mg daily    Thank you for allowing pharmacy to be a part of this patient's care.   Shauna Hugh, PharmD, [place]  PGY-1 Pharmacy Resident 06/16/2020 9:01 AM  Please check [email or web address] for unit-specific pharmacy phone numbers.

## 2020-06-16 NOTE — Progress Notes (Addendum)
NAME:  Roberto Haley, MRN:  [id number], DOB:  11-24-1940, LOS: 2 ADMISSION DATE:  06/10/2020, CONSULTATION DATE:  11/9 REFERRING MD:  Dr. Lorrin Goodell (Neurology), CHIEF COMPLAINT:  COVID/Stroke   Brief History   79 year old male admitted to [facility] with COVID pneumonia. He had abrupt change in mental status diagnosed as stroke. Transferred to [name] 11/9 for MRI/MRA.  History of present illness   79 year old male with PMH as below, which is significant for CKD, DM, HTN, and DM2. He has received both doses of [place] vaccination. Presented to [facility] ED 11/4 with complaints of SOB with oxygen saturations found to be in the 60s on room air. COVID tested positive. He was admitted and quickly escalated to requirement of 100% NRB. Treatment regimen included decadron, remdesivir, and baricitinib. He initially  improved and oxygen was weaning down. 11/8 he had acute change in mental status and CT was concerning for stroke. 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 No CTA due to contrast allergy. The outside hospital was unable to obtain MRA for COVID positive patient, and thus he was transferred to [facility] ICU for further evaluation. He was admitted to the neurology service and PCCM was consulted for concerns of respiratory decline.   Past Medical History   has a past medical history of Arthritis, Chronic kidney disease, Coronary artery disease, Diabetes mellitus, Groin hematoma (02/19/12), Hyperlipidemia, Hypertension, Neuropathy due to type 2 diabetes mellitus ([place]), Obesity, PVC (premature ventricular contraction), and SDH (subdural hematoma) ([place]) (02/06/2013).   Significant Hospital Events   11/4 admit [name] 11/9 tx to [facility]/ admitted by Neurology  11/10 intubated given poor mental status   Consults:    Procedures:  11/10 ETT >> 11/10 R IJ CVL >> Foley   Significant Diagnostic Tests:  11/8 CT head >  Low-density in the left  occipital lobe suggesting acute to subacute infarction.  Possible infarction in inferior left cerebellum.  No hemorrhage or mass-effect.  Chronic small vessel ischemic changes elsewhere throughout the brain.  11/9 MRI/MRA >  Multifocal acute ischemia throughout both hemispheres, worst in the left PCA territory and left PICA territory Occlusion of the left PCA at the P1 P2 junction,  the basilar artery is patent  11/10 TTE  1. Left ventricular ejection fraction, by estimation, is 40 to 45%. The left ventricle has mildly decreased function. The left ventricle demonstrates global hypokinesis. There is mild left ventricular hypertrophy. Left ventricular diastolic parameters  are indeterminate.  2. Right ventricular systolic function is normal. The right ventricular  size is normal. There is mildly elevated pulmonary artery systolic pressure. The estimated right ventricular systolic pressure is 97.0 mmHg.  3. Left atrial size was moderately dilated.  4. Mitral valve regurgitation may be underestimated due to "splay" artifact. The mitral valve is degenerative. Moderate mitral valve regurgitation. No evidence of mitral stenosis.  5. Tricuspid valve regurgitation is moderate.  6. The aortic valve is calcified. There is moderate calcification of the aortic valve. There is moderate thickening of the aortic valve. Aortic valve regurgitation is mild. Mild to moderate aortic valve sclerosis/calcification is present, without any  evidence of aortic stenosis.  7. The inferior vena cava is dilated in size with <50% respiratory variability, suggesting right atrial pressure of 15 mmHg.   11/10 CTH >> Redemonstrated multifocal acute/early subacute infarcts within the supratentorial and infratentorial brain. As before, the left PCA territory (thalamus and temporal occipital lobes) and left PICA territory (cerebellum)  are most notably affected. Findings are suspicious for an embolic process. No interval  intracranial abnormality appreciable by CT. No evidence of hemorrhagic conversion. Stable background cerebral atrophy and chronic small vessel ischemic disease. Paranasal sinus disease as described. Bilateral mastoid effusions.  Micro Data:  [place] Fast 11/10 >> MRSA 11/11 > neg  Antimicrobials:  Unasyn 11/10 >>  Interim history/subjective:  No events overnight  Tmax 98.7 Slightly more responsive, still not following commands, required 3 prn fentanyl doses overnight   Objective   Blood pressure (!) 154/90, pulse (!) 109, temperature 98.7 F (37.1 C), temperature source Axillary, resp. rate 14, height 5\' 8"  (1.727 m), weight 84.4 kg, SpO2 100 %.    Vent Mode: PRVC FiO2 (%):  [40 %] 40 % Set Rate:  [20 bmp] 20 bmp Vt Set:  [550 mL] 550 mL PEEP:  [5 cmH20] 5 cmH20 Plateau Pressure:  [15 MVH84-69 cmH20] 28 cmH20   Intake/Output Summary (Last 24 hours) at 06/16/2020 0920 Last data filed at 06/16/2020 0800 Gross per 24 hour  Intake 633.47 ml  Output 1250 ml  Net -616.53 ml   Filed Weights   06/15/20 0000 06/15/20 1400 06/16/20 0500  Weight: 91 kg 91 kg 84.4 kg   Examination: General: Elderly male in NAD intubated on MV  HEENT: MM pink/dry, R pupil 3 reactive, L 5/fixed, ETT, L nare cortrak, anicteric  Neuro: Eyes open (R more than Left), grimaces, does not f/c, flaccid in RUE, withdrawals in RLE and intermittently in LLE, LUE- extensor posturing at time but then will spont move CV: IRIR, afib, no murmur PULM:  MV supported breaths, clear, flipped to SBT- 5/5 taking  ~700 TV however became agitated, increased heart rate and tachypneic- flipped back to full support  GI: soft, bs+, foley (yellow with some brown supplement) Extremities: warm/dry, +2 LE edema  Skin: no rashes, bruising to legs/ arms   I/Os -636/ net -1.6 No CXR today  WBC 19.4-> 10.3, sCr 2.29-> 0.98  Resolved Hospital Problem list     Assessment & Plan:   Acute hypoxemic respiratory failure secondary  to COVID-10 pneumonia, pulmonary edema s/p lasix and ongoing hypoxia with worsening mental intubated 11/10 - Full MV support, PRVC 8cc/kg - Wean FiO2/ PEEP for sat goal >88% - Target Plateau Pressure < 30cm H20; Target driving pressure less than 15 cm of water- currently within goal Ventilator associated pneumonia prevention protocol - Daily SBT- re-attempt later today, mental status will be a barrier to extubation - Continue unasyn - Follow cultures  - Trend WBC/ fever curve (WBC remains within normal, PCT 11/10 0.57) - Intermittent CXR - VAP bundle   COVID-19  with Acute Pneumonia  - s/p 5 days remdesivir - Continue baricitinib-adjust to renal dose - solumedrol taper   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, embolic in setting of afib on eliquis -brainstem involvement explains his inability to maintain airway, guarded prognosis but basilar artery is open so hopefully he can have some recovery Acute encephalopathy: Related to hypoxia and stroke - Precedex stopped given bradycardia and hypotension  - repeat CTH 11/10 stable L PICA and largest L PCA infarcts  - PAD protocol with prn fentanyl for RASS goal 0/-1 - Per Neurology  - ongoing neuro checks - allowing permissive hypertension   PAF, prior on Eliquis CAD s/p CABG - Anticoagulation per neuro, starting heparin gtt today given stable CTH - Holding amiodarone, Metoprolol, isosorbide, hydralazine - TTE noted for EF 40-45%, LV global hypokinesis, normal RV systolic function,  mod MR, mod TR, mild AR (no significant change from prior echo 2019)   AKI, resolving - does have history of CKD, unclear baseline  - Trend renal indices - Strict I/Os  Hypothyroid - continue home synthroid   DM - holding metformin - continue SSI moderate-> reduce to sensitive and levemir 10 units BID (home dose 44 units BID  Hypernatremia, mild  - Add free water flushes   Best practice:  Diet: NPO; EN per cortrak, increase rate to 40  ml/hr Pain/Anxiety/Delirium protocol (if indicated): prn fentanyl  VAP protocol (if indicated): yes DVT prophylaxis: heparin sq for now GI prophylaxis: Pepcid Glucose control: CBG monitoring and SSI Mobility: BR Code Status: full code Family Communication: pending  Disposition: ICU  Labs   CBC: Recent Labs  Lab 06/15/20 0016 06/15/20 0746 06/15/20 1024 06/16/20 0424  WBC 19.4*  --   --  10.3  HGB 12.0* 10.9* 9.9* 14.1  HCT 36.8* 32.0* 29.0* 47.7  MCV 89.5  --   --  93.2  PLT 229  --   --  712    Basic Metabolic Panel: Recent Labs  Lab 06/15/20 0016 06/15/20 0746 06/15/20 1024 06/16/20 0424  NA 140 143 143 150*  K 4.6 4.6 4.5 4.8  CL 109  --   --  109  CO2 16*  --   --  33*  GLUCOSE 213*  --   --  244*  BUN 61*  --   --  26*  CREATININE 2.29*  2.25*  --   --  0.98  CALCIUM 8.5*  --   --  8.6*  MG  --   --   --  2.3  PHOS  --   --   --  2.6   GFR: Estimated Creatinine Clearance: 65.7 mL/min (by C-G formula based on SCr of 0.98 mg/dL). Recent Labs  Lab 06/15/20 0016 06/16/20 0424  PROCALCITON 0.57  --   WBC 19.4* 10.3    Liver Function Tests: Recent Labs  Lab 06/15/20 0016  AST 64*  ALT 40  ALKPHOS 229*  BILITOT 1.0  PROT 6.3*  ALBUMIN 2.3*   No results for input(s): LIPASE, AMYLASE in the last 168 hours. No results for input(s): AMMONIA in the last 168 hours.  ABG    Component Value Date/Time   PHART 7.335 (L) 06/15/2020 1024   PCO2ART 39.1 06/15/2020 1024   PO2ART 475 (H) 06/15/2020 1024   HCO3 20.9 06/15/2020 1024   TCO2 22 06/15/2020 1024   ACIDBASEDEF 5.0 (H) 06/15/2020 1024   O2SAT 100.0 06/15/2020 1024      Critical care time: 35 min     Kennieth Rad, ACNP [facility] 06/16/2020, 9:59 AM  See Shea Evans for personal pager PCCM on call pager [phone]

## 2020-06-16 NOTE — Progress Notes (Addendum)
ANTICOAGULATION CONSULT NOTE -  Follow Up Consult  Pharmacy Consult for heparin Indication: atrial fibrillation and stroke  Allergies  Allergen Reactions  . Diltiazem Other (See Comments)    Unknown  . Lovastatin Other (See Comments)    Unknown  . Proton Pump Inhibitors Other (See Comments)    Unknown   . Tramadol Nausea And Vomiting  . Codeine Rash  . Contrast Media [Iodinated Diagnostic Agents] Rash    Patient Measurements: Height: '5\' 8"'  (172.7 cm) Weight: 84.4 kg (186 lb 1.1 oz) IBW/kg (Calculated) : 68.4 Heparin Dosing Weight: 87.2 kg  Vital Signs: Temp: 97.7 F (36.5 C) (11/11 1959) Temp Source: Oral (11/11 1959) BP: 140/84 (11/11 2000) Pulse Rate: 111 (11/11 2000)  Labs: Recent Labs    06/15/20 0016 06/15/20 0016 06/15/20 0746 06/15/20 0746 06/15/20 1024 06/16/20 0424 06/16/20 1755  HGB 12.0*   < > 10.9*   < > 9.9* 14.1  --   HCT 36.8*   < > 32.0*  --  29.0* 47.7  --   PLT 229  --   --   --   --  200  --   HEPARINUNFRC  --   --   --   --   --   --  >2.20*  CREATININE 2.29*  2.25*  --   --   --   --  0.98  --    < > = values in this interval not displayed.    Estimated Creatinine Clearance: 65.7 mL/min (by C-G formula based on SCr of 0.98 mg/dL).   Medical History: Past Medical History:  Diagnosis Date  . Arthritis   . Chronic kidney disease    CKD STAGE 3;  CHRONIC RENAL INSUFFICIENCY  . Coronary artery disease   . Diabetes mellitus   . Groin hematoma 02/19/12   RIGHT  . Hyperlipidemia   . Hypertension   . Neuropathy due to type 2 diabetes mellitus ([place])   . Obesity   . PVC (premature ventricular contraction)   . SDH (subdural hematoma) ([place]) 02/06/2013   R parafalcine SDH after a fall, rx at [facility]    Medications:  Medications Prior to Admission  Medication Sig Dispense Refill Last Dose  . acetaminophen (TYLENOL) 500 MG tablet Take 500 mg by mouth every 6 (six) hours as needed.   Unknown  . amiodarone (PACERONE) 200 MG tablet TAKE 1  TABLET(200 MG) BY MOUTH TWICE DAILY 180 tablet 2 06/08/2020 at Unknown time  . atorvastatin (LIPITOR) 40 MG tablet TAKE 1 TABLET(40 MG) BY MOUTH DAILY 90 tablet 0 06/08/2020  . benazepril (LOTENSIN) 40 MG tablet Take 1 tablet by mouth daily.   06/08/2020  . cholecalciferol (VITAMIN D) 1000 UNITS tablet Take 1,000 Units by mouth daily.   06/08/2020  . clopidogrel (PLAVIX) 75 MG tablet TAKE 1 TABLET(75 MG) BY MOUTH DAILY WITH BREAKFAST 90 tablet 3 06/08/2020  . ELIQUIS 5 MG TABS tablet TAKE 1 TABLET(5 MG) BY MOUTH TWICE DAILY 180 tablet 2 06/08/2020  . insulin aspart (NOVOLOG) 100 UNIT/ML FlexPen Inject 10-20 Units into the skin 2 (two) times daily with a meal.    06/08/2020  . isosorbide mononitrate (IMDUR) 60 MG 24 hr tablet TAKE 1 AND 1/2 TABLETS BY MOUTH EVERY AM AND TAKE 0.5 TABLET BY MOUTH EVERY EVENING 180 tablet 3 06/08/2020  . Lancets (ONETOUCH DELICA PLUS [id number]) MISC USE AS DIRECTED TO TEST BLOOD GLUCOSE BID     . metFORMIN (GLUCOPHAGE-XR) 750 MG 24 hr tablet Take 750 mg  by mouth daily with breakfast.   06/08/2020  . metoprolol succinate (TOPROL-XL) 50 MG 24 hr tablet TAKE 1 TABLET(50 MG) BY MOUTH DAILY 90 tablet 1 06/15/2020 at Unknown time  . NOVOFINE AUTOCOVER 30G X 8 MM MISC      . ONE TOUCH ULTRA TEST test strip   4   . TRULICITY 1.5 TU/8.8KC SOPN Inject 1.5 mg as directed once a week.   12 05/25/2020  . amLODipine (NORVASC) 5 MG tablet Take 1 tablet (5 mg total) by mouth daily. 90 tablet 3 06/08/2020  . DUREZOL 0.05 % EMUL      . furosemide (LASIX) 40 MG tablet Take one tablet (40 mg) by mouth every morning and half a tablet (20 mg) by mouth EVERY afternoon. 135 tablet 6   . hydrALAZINE (APRESOLINE) 50 MG tablet TAKE 1 AND 1/2 TABLETS(75 MG) BY MOUTH TWICE DAILY (Patient taking differently: 50 mg 3 (three) times daily. ) 180 tablet 2 06/08/2020  . ketoconazole (NIZORAL) 2 % cream Apply 1 fingertip amount to each foot daily. 30 g 0   . LEVEMIR FLEXTOUCH 100 UNIT/ML Pen Inject 44 Units into  the skin 2 (two) times daily.   12 06/08/2020  . levofloxacin (LEVAQUIN) 750 MG tablet Take 750 mg by mouth daily.     Marland Kitchen levothyroxine (SYNTHROID) 150 MCG tablet Take 175 mcg by mouth every morning.    06/08/2020  . levothyroxine (SYNTHROID) 175 MCG tablet Take 175 mcg by mouth daily before breakfast.     . LINZESS 145 MCG CAPS capsule Take 145 mcg by mouth daily before breakfast.   11   . Multiple Vitamin (MULTIVITAMIN WITH MINERALS) TABS Take 1 tablet by mouth daily.   06/08/2020  . nitroGLYCERIN (NITROSTAT) 0.4 MG SL tablet Place 1 tablet (0.4 mg total) under the tongue every 5 (five) minutes as needed. For chest pain 25 tablet 12   . ofloxacin (OCUFLOX) 0.3 % ophthalmic solution      . ondansetron (ZOFRAN-ODT) 4 MG disintegrating tablet Take 4 mg by mouth every 4 (four) hours as needed for nausea or vomiting. DISSOLVE ORALLY     . oxybutynin (DITROPAN) 5 MG tablet Take 1 tablet by mouth daily.     . potassium chloride SA (KLOR-CON) 20 MEQ tablet TAKE 1 TABLET BY MOUTH DAILY AS NEEDED 90 tablet 1   . prednisoLONE acetate (PRED FORTE) 1 % ophthalmic suspension      . predniSONE (DELTASONE) 20 MG tablet Take 20 mg by mouth daily.     . vitamin C (ASCORBIC ACID) 500 MG tablet Take 500 mg by mouth daily.      Scheduled:  .  stroke: mapping our early stages of recovery book   Does not apply Once  . [START ON 06/17/2020] (feeding supplement) PROSource Plus  30 mL Oral BID BM  . amiodarone  100 mg Per Tube Daily  . baricitinib  4 mg Per Tube Daily  . chlorhexidine gluconate (MEDLINE KIT)  15 mL Mouth Rinse BID  . Chlorhexidine Gluconate Cloth  6 each Topical Daily  . docusate  100 mg Per Tube BID  . famotidine  20 mg Per Tube Daily  . free water  200 mL Per Tube Q4H  . insulin aspart  0-9 Units Subcutaneous Q4H  . insulin detemir  10 Units Subcutaneous BID  . levothyroxine  175 mcg Per Tube Q0600  . mouth rinse  15 mL Mouth Rinse 10 times per day  . methylPREDNISolone (SOLU-MEDROL) injection  50 mg Intravenous BID  . [START ON 06/17/2020] methylPREDNISolone (SOLU-MEDROL) injection  40 mg Intravenous Q12H   Followed by  . [START ON 06/18/2020] methylPREDNISolone (SOLU-MEDROL) injection  30 mg Intravenous Q12H   Followed by  . [START ON 07-04-20] methylPREDNISolone (SOLU-MEDROL) injection  20 mg Intravenous Q12H   Followed by  . [START ON 06/20/2020] methylPREDNISolone (SOLU-MEDROL) injection  20 mg Intravenous Daily  . metoprolol tartrate  25 mg Per Tube BID  . polyethylene glycol  17 g Per Tube Daily   Infusions:  . ampicillin-sulbactam (UNASYN) IV Stopped (06/16/20 1759)  . feeding supplement (VITAL 1.5 CAL) 1,000 mL (06/16/20 1729)  . heparin 1,100 Units/hr (06/16/20 2000)   PRN: acetaminophen **OR** acetaminophen (TYLENOL) oral liquid 160 mg/5 mL **OR** acetaminophen, fentaNYL (SUBLIMAZE) injection, fentaNYL (SUBLIMAZE) injection, ipratropium-albuterol, senna-docusate   Anti-infectives (From admission, onward)   Start     Dose/Rate Route Frequency Ordered Stop   06/16/20 1000  Ampicillin-Sulbactam (UNASYN) 3 g in sodium chloride 0.9 % 100 mL IVPB        3 g 200 mL/hr over 30 Minutes Intravenous Every 6 hours 06/16/20 0900     06/15/20 1130  Ampicillin-Sulbactam (UNASYN) 3 g in sodium chloride 0.9 % 100 mL IVPB  Status:  Discontinued        3 g 200 mL/hr over 30 Minutes Intravenous Every 12 hours 06/15/20 1117 06/16/20 0900      Assessment: 79 yo male presents from an [place] with concern for stroke and is also COVID-19. PTA the patient is on apixaban for atrial fibrillation. The patient also takes clopidogrel PTA which is currently on hold. Apixaban is on hold and pharmacy is consulted to dose heparin.  The patient's exact last dose of apixaban is unknown, however, it was held at the [facility] and it is unlikely that apixaban will affect the heparin level.   Heparin level > 2.20 on heparin 1100 units/hr which was drawn correctly per RN. Stat aPTT obtained to confirm and found  to be > 200 sec. No reported bleeding.     Goal of Therapy:  Heparin level 0.3-0.5 units/ml Monitor platelets by anticoagulation protocol: Yes   Plan:  Hold heparin for 1 and 1/2 hour Decrease heparin to 800 units/hr after holding heparin Check heparin level and aPTT at 0730 Monitor heparin level, CBC, and S/S of bleeding daily  Cristela Felt, PharmD Clinical Pharmacist  06/16/2020 9:25 PM  Please check [email or web address] for unit-specific pharmacy phone numbers.

## 2020-06-16 NOTE — Progress Notes (Signed)
Nutrition Follow-up  DOCUMENTATION CODES:   Not applicable  INTERVENTION:   Tube Feeding via Cortrak: Vital 1.5 at 55 ml/hr Pro-Source TF 45 mL BID Provides 2060 kcals 111 g of protein and 1003 mL of free water Meets 100% estimated calorie and protein needs  Total free water from TF plus current free water flushes 200 mL q 4 hours: 2203 mL of free water   NUTRITION DIAGNOSIS:   Inadequate oral intake related to acute illness as evidenced by NPO status.  GOAL:   Patient will meet greater than or equal to 90% of their needs   MONITOR:   Vent status, Labs, Weight trends, TF tolerance  REASON FOR ASSESSMENT:   Consult, Ventilator Enteral/tube feeding initiation and management  ASSESSMENT:   79 yo male admitted with acute respiratory failure due to COVID-19 pneumonia/ARDS and acute stroke. PMH includes CAD, DM, HTN, HLD  11/04 Admitted  11/09 Transfer to [facility] 11/10 Cortrak placed, Intubated  Pt remains on vent support, not on sedation but poorly responsive  Vital High Protein started at 20 ml/hr yesterday by MD via Adult TF Protocol; increased to 40 ml/hr today. Tolerating TF   Hypernatremic; Free water flush of 200 mL q 4 hours  Unable to obtain diet and weight history from patient at this time. Current wt 84.4 kg. Unsure how well patient was eating during hospitalization at [facility] prior to transfer  Labs: sodium 150 (H) Meds: ss novolog, levemir, solumedrol, miralax  Diet Order:   Diet Order            Diet NPO time specified  Diet effective now                 EDUCATION NEEDS:   Not appropriate for education at this time  Skin:  Skin Assessment: Skin Integrity Issues: Skin Integrity Issues:: Stage I Stage I: buttocks  Last BM:  PTA  Height:   Ht Readings from Last 1 Encounters:  06/15/20 5\' 8"  (1.727 m)    Weight:   Wt Readings from Last 1 Encounters:  06/16/20 84.4 kg    BMI:  Body mass index is 28.29 kg/m.  Estimated  Nutritional Needs:   Kcal:  2000-2500 kcals  Protein:  100-125 g  Fluid:  >/= 2 L  Kerman Passey MS, RDN, LDN, CNSC Registered Dietitian III Clinical Nutrition RD Pager and On-Call Pager Number Located in [place]

## 2020-06-17 ENCOUNTER — Inpatient Hospital Stay (HOSPITAL_COMMUNITY): Payer: Medicare Other

## 2020-06-17 DIAGNOSIS — I639 Cerebral infarction, unspecified: Secondary | ICD-10-CM | POA: Diagnosis not present

## 2020-06-17 DIAGNOSIS — U071 COVID-19: Secondary | ICD-10-CM | POA: Diagnosis not present

## 2020-06-17 DIAGNOSIS — G9340 Encephalopathy, unspecified: Secondary | ICD-10-CM | POA: Diagnosis not present

## 2020-06-17 LAB — COMPREHENSIVE METABOLIC PANEL
ALT: 37 U/L (ref 0–44)
AST: 48 U/L — ABNORMAL HIGH (ref 15–41)
Albumin: 1.6 g/dL — ABNORMAL LOW (ref 3.5–5.0)
Alkaline Phosphatase: 149 U/L — ABNORMAL HIGH (ref 38–126)
Anion gap: 11 (ref 5–15)
BUN: 99 mg/dL — ABNORMAL HIGH (ref 8–23)
CO2: 21 mmol/L — ABNORMAL LOW (ref 22–32)
Calcium: 7.7 mg/dL — ABNORMAL LOW (ref 8.9–10.3)
Chloride: 117 mmol/L — ABNORMAL HIGH (ref 98–111)
Creatinine, Ser: 2.63 mg/dL — ABNORMAL HIGH (ref 0.61–1.24)
GFR, Estimated: 24 mL/min — ABNORMAL LOW (ref >60)
Glucose, Bld: 210 mg/dL — ABNORMAL HIGH (ref 70–99)
Potassium: 4.1 mmol/L (ref 3.5–5.1)
Sodium: 149 mmol/L — ABNORMAL HIGH (ref 135–145)
Total Bilirubin: 0.9 mg/dL (ref 0.3–1.2)
Total Protein: 4.5 g/dL — ABNORMAL LOW (ref 6.5–8.1)

## 2020-06-17 LAB — GLUCOSE, CAPILLARY
Glucose-Capillary: 169 mg/dL — ABNORMAL HIGH (ref 70–99)
Glucose-Capillary: 197 mg/dL — ABNORMAL HIGH (ref 70–99)
Glucose-Capillary: 248 mg/dL — ABNORMAL HIGH (ref 70–99)
Glucose-Capillary: 281 mg/dL — ABNORMAL HIGH (ref 70–99)
Glucose-Capillary: 289 mg/dL — ABNORMAL HIGH (ref 70–99)
Glucose-Capillary: 344 mg/dL — ABNORMAL HIGH (ref 70–99)

## 2020-06-17 LAB — APTT
aPTT: 116 seconds — ABNORMAL HIGH (ref 24–36)
aPTT: 89 seconds — ABNORMAL HIGH (ref 24–36)

## 2020-06-17 LAB — HEPARIN LEVEL (UNFRACTIONATED)
Heparin Unfractionated: 1.98 IU/mL — ABNORMAL HIGH (ref 0.30–0.70)
Heparin Unfractionated: 1.74 IU/mL — ABNORMAL HIGH (ref 0.30–0.70)

## 2020-06-17 LAB — PROCALCITONIN: Procalcitonin: 0.37 ng/mL

## 2020-06-17 IMAGING — DX DG CHEST 1V PORT
1 series · 1 of 1 positions shown · non-contrast
Comparison: [DATE]

CLINICAL DATA: Respiratory failure.

EXAM:
PORTABLE CHEST 1 VIEW

[chest ap]
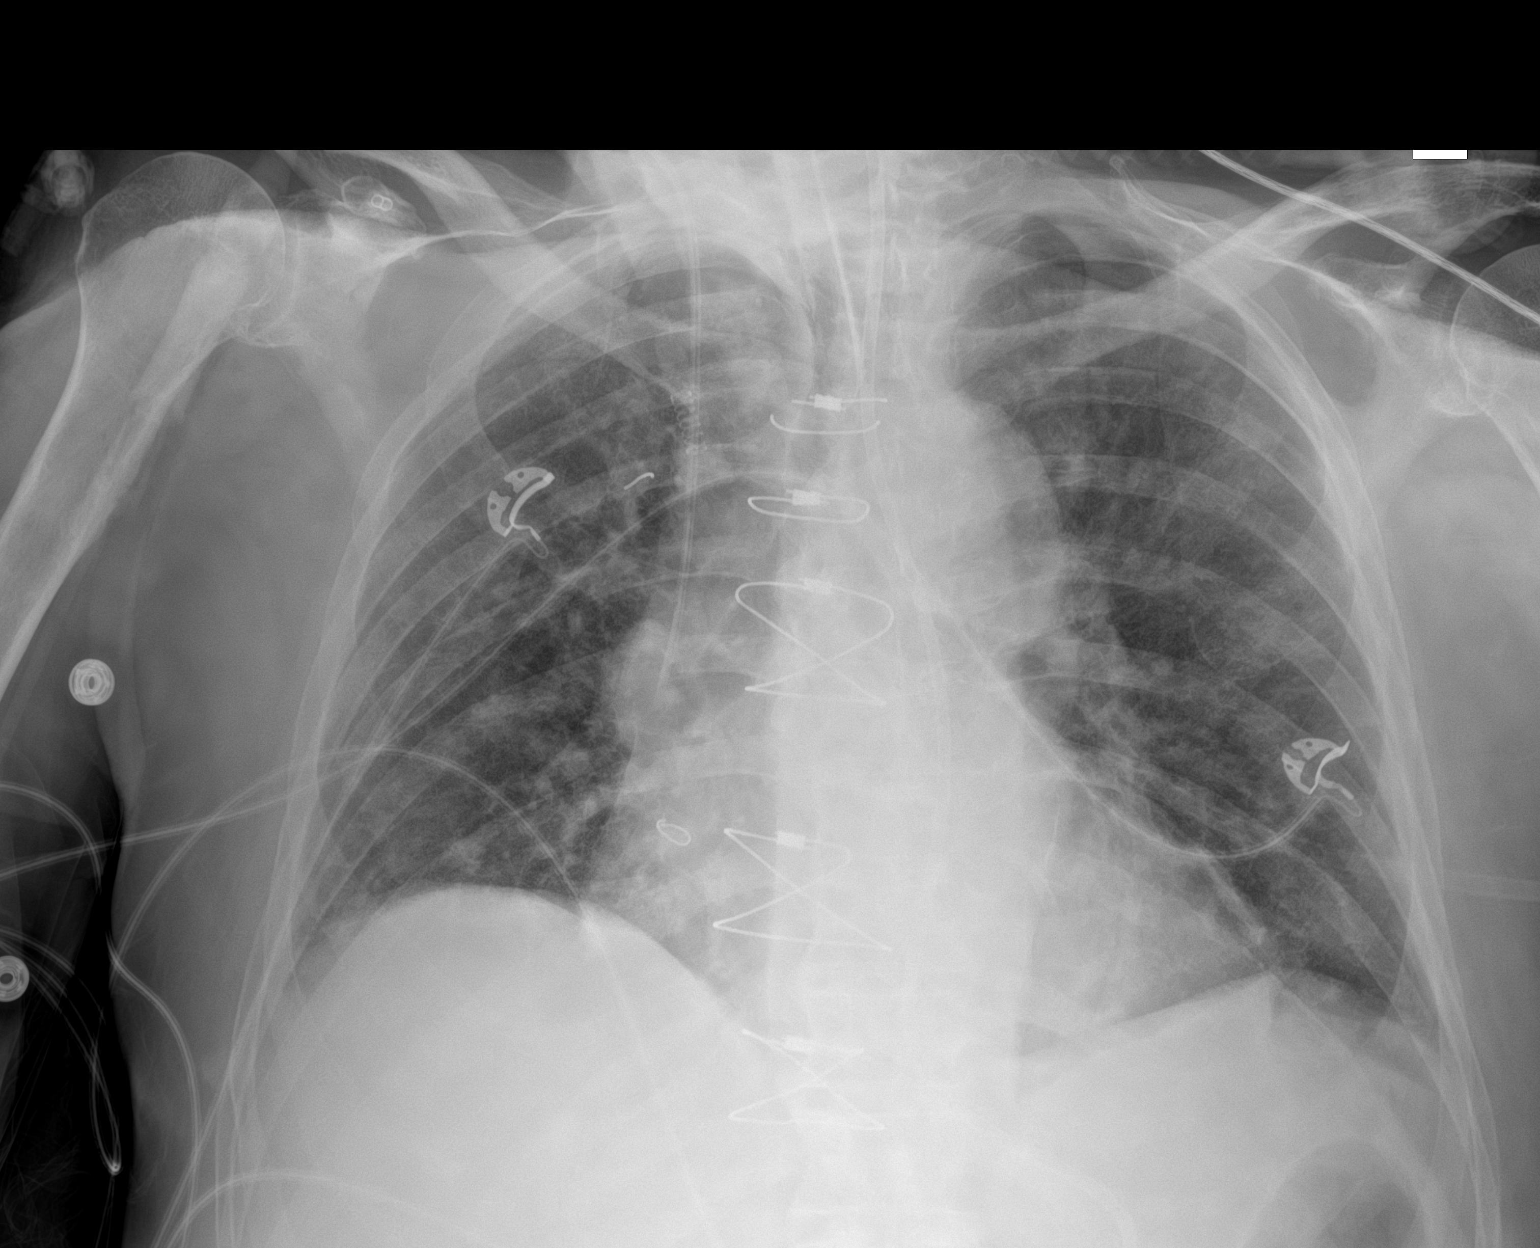

[1 of 1 positions shown; findings below may reference images not displayed]

FINDINGS: The cardiac silhouette, mediastinal and hilar contours are stable.

The endotracheal tube, feeding tube and right IJ catheters are
stable.

Persistent interstitial and airspace process in the lungs. No
pleural effusions or pneumothorax.
IMPRESSION: 1. Stable support apparatus.
2. Persistent interstitial and airspace process.

## 2020-06-17 MED ORDER — PROSOURCE TF PO LIQD
45.0000 mL | Freq: Two times a day (BID) | ORAL | Status: DC
  Administered 2020-06-17 – 2020-06-18 (×4): 45 mL
  Filled 2020-06-17 (×4): qty 45

## 2020-06-17 MED ORDER — AMIODARONE HCL 200 MG PO TABS
200.0000 mg | ORAL_TABLET | Freq: Two times a day (BID) | ORAL | Status: DC
  Administered 2020-06-17 – 2020-06-18 (×2): 200 mg
  Filled 2020-06-17 (×2): qty 1

## 2020-06-17 MED ORDER — HEPARIN (PORCINE) 25000 UT/250ML-% IV SOLN
450.0000 [IU]/h | INTRAVENOUS | Status: DC
  Administered 2020-06-17: 600 [IU]/h via INTRAVENOUS
  Filled 2020-06-17: qty 250

## 2020-06-17 MED ORDER — BISACODYL 10 MG RE SUPP
10.0000 mg | Freq: Once | RECTAL | Status: DC
  Filled 2020-06-17: qty 1

## 2020-06-17 MED ORDER — BARICITINIB 1 MG PO TABS
1.0000 mg | ORAL_TABLET | Freq: Every day | ORAL | Status: DC
  Administered 2020-06-18: 1 mg
  Filled 2020-06-17: qty 1

## 2020-06-17 MED ORDER — SODIUM CHLORIDE 0.9 % IV SOLN
3.0000 g | Freq: Two times a day (BID) | INTRAVENOUS | Status: DC
  Administered 2020-06-17 – 2020-06-18 (×3): 3 g via INTRAVENOUS
  Filled 2020-06-17 (×3): qty 8

## 2020-06-17 MED ORDER — AMIODARONE HCL 200 MG PO TABS
100.0000 mg | ORAL_TABLET | Freq: Every day | ORAL | Status: AC
  Administered 2020-06-17: 100 mg
  Filled 2020-06-17: qty 1

## 2020-06-17 NOTE — Progress Notes (Addendum)
PHARMACY NOTE:  ANTIMICROBIAL RENAL DOSAGE ADJUSTMENT  Current antimicrobial regimen includes a mismatch between antimicrobial dosage and estimated renal function.  As per policy approved by the Pharmacy & Therapeutics and Medical Executive Committees, the antimicrobial dosage will be adjusted accordingly.  Current antimicrobial dosage:  Unasyn 3g q6h and baricitinib 4 mg daily  Indication: aspiration pneumonia  Renal Function:  Estimated Creatinine Clearance: 24.5 mL/min (A) (by C-G formula based on SCr of 2.63 mg/dL (H)). []      On intermittent HD, scheduled: []      On CRRT    Antimicrobial dosage has been changed to:  Unasyn 3g q12h and baricitinib 1 mg daily  Thank you for allowing pharmacy to be a part of this patient's care.   Shauna Hugh, PharmD, [place]  PGY-1 Pharmacy Resident 06/17/2020 7:51 AM  Please check [email or web address] for unit-specific pharmacy phone numbers.

## 2020-06-17 NOTE — Progress Notes (Signed)
NAME:  [facility], MRN:  [id number], DOB:  Nov 17, 1940, LOS: 3 ADMISSION DATE:  06/06/2020, CONSULTATION DATE:  11/9 REFERRING MD:  Dr. Lorrin Goodell (Neurology), CHIEF COMPLAINT:  COVID/Stroke   Brief History   79 year old male admitted to [facility] with COVID pneumonia. He had abrupt change in mental status diagnosed as stroke. Transferred to [facility] 11/9 for MRI/MRA.  History of present illness   79 year old male with PMH as below, which is significant for CKD, DM, HTN, and DM2. He has received both doses of [place] vaccination. Presented to [facility] ED 11/4 with complaints of SOB with oxygen saturations found to be in the 60s on room air. COVID tested positive. He was admitted and quickly escalated to requirement of 100% NRB. Treatment regimen included decadron, remdesivir, and baricitinib. He initially  improved and oxygen was weaning down. 11/8 he had acute change in mental status and CT was concerning for stroke. 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 No CTA due to contrast allergy. The outside hospital was unable to obtain MRA for COVID positive patient, and thus he was transferred to [facility] for further evaluation. He was admitted to the neurology service and PCCM was consulted for concerns of respiratory decline.   Past Medical History   has a past medical history of Arthritis, Chronic kidney disease, Coronary artery disease, Diabetes mellitus, Groin hematoma (02/19/12), Hyperlipidemia, Hypertension, Neuropathy due to type 2 diabetes mellitus ([facility]), Obesity, PVC (premature ventricular contraction), and SDH (subdural hematoma) ([facility]) (02/06/2013).   Significant Hospital Events   11/4 admit [facility] 11/9 tx to [facility]/ admitted by Neurology  11/10 intubated given poor mental status   Consults:    Procedures:  11/10 ETT >> 11/10 R IJ CVL >> Foley   Significant Diagnostic Tests:  11/8 CT head >  Low-density in the left  occipital lobe suggesting acute to subacute infarction.  Possible infarction in inferior left cerebellum.  No hemorrhage or mass-effect.  Chronic small vessel ischemic changes elsewhere throughout the brain.  11/9 MRI/MRA >  Multifocal acute ischemia throughout both hemispheres, worst in the left PCA territory and left PICA territory Occlusion of the left PCA at the P1 P2 junction,  the basilar artery is patent  11/10 TTE  1. Left ventricular ejection fraction, by estimation, is 40 to 45%. The left ventricle has mildly decreased function. The left ventricle demonstrates global hypokinesis. There is mild left ventricular hypertrophy. Left ventricular diastolic parameters  are indeterminate.  2. Right ventricular systolic function is normal. The right ventricular  size is normal. There is mildly elevated pulmonary artery systolic pressure. The estimated right ventricular systolic pressure is 88.8 mmHg.  3. Left atrial size was moderately dilated.  4. Mitral valve regurgitation may be underestimated due to "splay" artifact. The mitral valve is degenerative. Moderate mitral valve regurgitation. No evidence of mitral stenosis.  5. Tricuspid valve regurgitation is moderate.  6. The aortic valve is calcified. There is moderate calcification of the aortic valve. There is moderate thickening of the aortic valve. Aortic valve regurgitation is mild. Mild to moderate aortic valve sclerosis/calcification is present, without any  evidence of aortic stenosis.  7. The inferior vena cava is dilated in size with <50% respiratory variability, suggesting right atrial pressure of 15 mmHg.   11/10 CTH >> Redemonstrated multifocal acute/early subacute infarcts within the supratentorial and infratentorial brain. As before, the left PCA territory (thalamus and temporal occipital lobes) and left PICA territory (cerebellum)  are most notably affected. Findings are suspicious for an embolic process. No interval  intracranial abnormality appreciable by CT. No evidence of hemorrhagic conversion. Stable background cerebral atrophy and chronic small vessel ischemic disease. Paranasal sinus disease as described. Bilateral mastoid effusions.  Micro Data:  [place] Fast 11/10 >> MRSA 11/11 > neg  Antimicrobials:  Unasyn 11/10 >>  Interim history/subjective:   Patient remains critically ill intubated on mechanical life support. No significant neurologic responses. He will grimace.  Objective   Blood pressure 98/66, pulse 96, temperature 98.2 F (36.8 C), temperature source Oral, resp. rate 20, height 5\' 8"  (1.727 m), weight 84.5 kg, SpO2 100 %.    Vent Mode: PRVC FiO2 (%):  [40 %] 40 % Set Rate:  [20 bmp] 20 bmp Vt Set:  [450 mL-550 mL] 450 mL PEEP:  [5 cmH20] 5 cmH20 Pressure Support:  [5 cmH20] 5 cmH20 Plateau Pressure:  [14 BLT90-30 cmH20] 14 cmH20   Intake/Output Summary (Last 24 hours) at 06/17/2020 0718 Last data filed at 06/17/2020 0600 Gross per 24 hour  Intake 2567.39 ml  Output 1410 ml  Net 1157.39 ml   Filed Weights   06/15/20 1400 06/16/20 0500 06/17/20 0409  Weight: 91 kg 84.4 kg 84.5 kg   Examination: General: Elderly male intubated on mechanical life support HEENT: NCAT right pupil reactive left fixed, endotracheal tube in place Neuro: I can get him to grimace with painful stimuli but no spontaneous movements CV: Irregularly irregular S1-S2 PULM: Bilateral mechanically ventilated breath sounds GI: Soft nontender nondistended Extremities: No significant edema Skin: No rash  Resolved Hospital Problem list     Assessment & Plan:   Acute hypoxemic respiratory failure secondary to COVID-10 pneumonia, pulmonary edema s/p lasix and ongoing hypoxia with worsening mental intubated 11/10 -Patient remains on full mechanical vent support -Wean PEEP and FiO2 as tolerated -Continue SBT as tolerated -SAT as tolerated, has remained off sedation for 2 days with no significant  neurologic improvement. -5 days Unasyn stop date -Chest x-ray as needed.   COVID-19 with with bilateral infiltrates, possible pneumonia - s/p 5 days remdesivir -Complete course of baricitinib -Solu-Medrol taper already in place.  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 -Unable to extubate at this time due to mental status  PAF, prior on Eliquis CAD s/p CABG Chronic systolic heart failure, prior ejection fraction 40 to 45% Plan: Heparin drip started per neurology Continue amiodarone Restart PTA metoprolol Hold isosorbide and hydralazine  AKI, resolving - follow BMP   Hypothyroid - continue synthroid   DM - hold metformin from PTA - continue levemir and SSI   Hypernatremia, mild  - FWF   Best practice:  Diet: NPO; EN per cortrak, increase rate to 40 ml/hr Pain/Anxiety/Delirium protocol (if indicated): prn fentanyl  VAP protocol (if indicated): yes DVT prophylaxis: heparin sq for now GI prophylaxis: Pepcid Glucose control: CBG monitoring and SSI Mobility: BR Code Status: full code Family Communication: I called and spoke with Rose the  patients daughter.  Disposition: ICU  Labs   CBC: Recent Labs  Lab 06/15/20 0016 06/15/20 0746 06/15/20 1024 06/16/20 0424  WBC 19.4*  --   --  10.3  HGB 12.0* 10.9* 9.9* 14.1  HCT 36.8* 32.0* 29.0* 47.7  MCV 89.5  --   --  93.2  PLT 229  --   --  200     Basic Metabolic Panel: Recent Labs  Lab 06/15/20 0016 06/15/20 0746 06/15/20 1024 06/16/20 0424 06/17/20 0427  NA 140 143 143 150* 149*  K 4.6 4.6 4.5 4.8 4.1  CL 109  --   --  109 117*  CO2 16*  --   --  33* 21*  GLUCOSE 213*  --   --  244* 210*  BUN 61*  --   --  26* 99*  CREATININE 2.29*  2.25*  --   --  0.98 2.63*  CALCIUM 8.5*  --   --  8.6* 7.7*  MG  --   --   --  2.3  --   PHOS  --   --   --  2.6  --    GFR: Estimated Creatinine Clearance: 24.5 mL/min (A) (by C-G formula based on SCr of 2.63 mg/dL (H)). Recent Labs  Lab 06/15/20 0016 06/16/20 0424 06/17/20 0427  PROCALCITON 0.57  --  0.37  WBC 19.4* 10.3  --     Liver Function Tests: Recent Labs  Lab 06/15/20 0016 06/17/20 0427  AST 64* 48*  ALT 40 37  ALKPHOS 229* 149*  BILITOT 1.0 0.9  PROT 6.3* 4.5*  ALBUMIN 2.3* 1.6*   No results for input(s): LIPASE, AMYLASE in the last 168 hours. No results for input(s): AMMONIA in the last 168 hours.  ABG    Component Value Date/Time   PHART 7.335 (L) 06/15/2020 1024   PCO2ART 39.1 06/15/2020 1024   PO2ART 475 (H) 06/15/2020 1024   HCO3 20.9 06/15/2020 1024   TCO2 22 06/15/2020 1024   ACIDBASEDEF 5.0 (H) 06/15/2020 1024   O2SAT 100.0 06/15/2020 1024      This patient is critically ill with multiple organ system failure; which, requires frequent high complexity decision making, assessment, support, evaluation, and titration of therapies. This was completed through the application of advanced monitoring technologies and extensive interpretation of multiple databases. During this encounter critical care time was devoted to patient care services described in this note for 33 minutes.  Garner Nash, DO [facility] 06/17/2020 7:19 AM

## 2020-06-17 NOTE — Progress Notes (Signed)
ANTICOAGULATION CONSULT NOTE  Pharmacy Consult:  Heparin Indication: atrial fibrillation and stroke  Allergies  Allergen Reactions  . Diltiazem Other (See Comments)    Unknown  . Lovastatin Other (See Comments)    Unknown  . Proton Pump Inhibitors Other (See Comments)    Unknown   . Tramadol Nausea And Vomiting  . Codeine Rash  . Contrast Media [Iodinated Diagnostic Agents] Rash    Patient Measurements: Height: 5\' 8"  (172.7 cm) Weight: 84.5 kg (186 lb 4.6 oz) IBW/kg (Calculated) : 68.4 Heparin Dosing Weight: 87.2 kg  Vital Signs: Temp: 98.4 F (36.9 C) (11/12 1545) Temp Source: Axillary (11/12 1545) BP: 103/72 (11/12 0900) Pulse Rate: 90 (11/12 1545)  Labs: Recent Labs    06/15/20 0016 06/15/20 0016 06/15/20 0746 06/15/20 0746 06/15/20 1024 06/16/20 0424 06/16/20 1755 06/16/20 2014 06/17/20 0427 06/17/20 0535 06/17/20 0542 06/17/20 1658  HGB 12.0*   < > 10.9*   < > 9.9* 14.1  --   --   --   --   --   --   HCT 36.8*   < > 32.0*  --  29.0* 47.7  --   --   --   --   --   --   PLT 229  --   --   --   --  200  --   --   --   --   --   --   APTT  --   --   --   --   --   --   --  >200*  --  116*  --  89*  HEPARINUNFRC  --   --   --   --   --   --  >2.20*  --   --   --  1.98* 1.74*  CREATININE 2.29*  2.25*  --   --   --   --  0.98  --   --  2.63*  --   --   --    < > = values in this interval not displayed.    Estimated Creatinine Clearance: 24.5 mL/min (A) (by C-G formula based on SCr of 2.63 mg/dL (H)).   Assessment: 79 yo male presents from an [facility] with concern for stroke and is also COVID-19. PTA the patient is on apixaban for atrial fibrillation.  Apixaban is on hold and pharmacy is consulted to dose heparin.  APTT slightly supra-therapeutic and heparin level is elevated due to Eliquis' effect.  Lab drawn appropriately.  No bleeding reported.  Goal of Therapy:  Heparin level 0.3-0.5 units/ml  APTT 66-85 sec Monitor platelets by anticoagulation protocol:  Yes   Plan:  Reduce heparin infusion to 550 units/hr Repeat aPTT in 8 hrs  Michae Grimley D. Mina Marble, PharmD, BCPS, [place] 06/17/2020, 6:18 PM

## 2020-06-17 NOTE — Progress Notes (Signed)
ANTICOAGULATION CONSULT NOTE - Follow Up Consult  Pharmacy Consult for heparin Indication: atrial fibrillation and stroke  Labs: Recent Labs    06/15/20 0016 06/15/20 0016 06/15/20 0746 06/15/20 0746 06/15/20 1024 06/16/20 0424 06/16/20 1755 06/16/20 2014 06/17/20 0427 06/17/20 0535 06/17/20 0542  HGB 12.0*   < > 10.9*   < > 9.9* 14.1  --   --   --   --   --   HCT 36.8*   < > 32.0*  --  29.0* 47.7  --   --   --   --   --   PLT 229  --   --   --   --  200  --   --   --   --   --   APTT  --   --   --   --   --   --   --  >200*  --  116*  --   HEPARINUNFRC  --   --   --   --   --   --  >2.20*  --   --   --  1.98*  CREATININE 2.29*  2.25*  --   --   --   --  0.98  --   --  2.63*  --   --    < > = values in this interval not displayed.    Assessment: 79yo male remains supratherapeutic on heparin after rate change though improving; no gtt issues or signs of bleeding per RN.  Goal of Therapy:  aPTT 66-85 seconds   Plan:  Will hold heparin x1h and decrease heparin gtt by 3 units/kg/hr to 600 units/hr and check PTT in 8 hours.    Wynona Neat, PharmD, BCPS  06/17/2020,6:58 AM

## 2020-06-17 NOTE — Progress Notes (Signed)
STROKE TEAM PROGRESS NOTE   INTERVAL HISTORY Patient remains on ventilatory support for respiratory failure due to Covid related ARDS and inability to protect airway from his posterior circulation strokes.  He is not on any sedation but remains very poorly responsive.    Vitals:   06/17/20 0731 06/17/20 0748 06/17/20 1121 06/17/20 1128  BP: (!) 92/58     Pulse: 88     Resp: (!) 23     Temp:  99.1 F (37.3 C) 98.8 F (37.1 C)   TempSrc:  Axillary Axillary   SpO2: 100%   100%  Weight:      Height:       CBC:  Recent Labs  Lab 06/15/20 0016 06/15/20 0746 06/15/20 1024 06/16/20 0424  WBC 19.4*  --   --  10.3  HGB 12.0*   < > 9.9* 14.1  HCT 36.8*   < > 29.0* 47.7  MCV 89.5  --   --  93.2  PLT 229  --   --  200   < > = values in this interval not displayed.   Basic Metabolic Panel:  Recent Labs  Lab 06/16/20 0424 06/17/20 0427  NA 150* 149*  K 4.8 4.1  CL 109 117*  CO2 33* 21*  GLUCOSE 244* 210*  BUN 26* 99*  CREATININE 0.98 2.63*  CALCIUM 8.6* 7.7*  MG 2.3  --   PHOS 2.6  --    Lipid Panel:  Recent Labs  Lab 06/15/20 0016  CHOL 83  TRIG 63  HDL 28*  CHOLHDL 3.0  VLDL 13  LDLCALC 42   HgbA1c:  Recent Labs  Lab 06/15/20 0016  HGBA1C 7.7*   Urine Drug Screen: No results for input(s): LABOPIA, COCAINSCRNUR, LABBENZ, AMPHETMU, THCU, LABBARB in the last 168 hours.  Alcohol Level No results for input(s): ETH in the last 168 hours.  IMAGING past 24 hours DG Chest Port 1 View  Result Date: 06/17/2020 CLINICAL DATA:  Respiratory failure. EXAM: PORTABLE CHEST 1 VIEW COMPARISON:  06/15/2020 FINDINGS: The cardiac silhouette, mediastinal and hilar contours are stable. The endotracheal tube, feeding tube and right IJ catheters are stable. Persistent interstitial and airspace process in the lungs. No pleural effusions or pneumothorax. IMPRESSION: 1. Stable support apparatus. 2. Persistent interstitial and airspace process. Electronically Signed   By: Marijo Sanes  M.D.   On: 06/17/2020 07:21    PHYSICAL EXAM Elderly Caucasian male who is not sedated and is  intubated and on ventilatory support. . Afebrile. Head is nontraumatic. Neck is supple without bruit.     Neurological Exam : Patient is intubated. Eyes are closed. Does not respond to sternal rub. Resists eye opening slightly. Eyes are slightly dysconjugate and skew. Pupils equal reactive. Doll's eye movements are sluggish. Fundi not visualized. Right lower facial weakness. Tongue midline. No spontaneous extremity movements.  Does not withdraw to painful stim in any extremity. Right plantar upgoing left downgoing. ASSESSMENT/PLAN [facility] is a 79 y.o. male with history of  DM2, CAD, HTN, HLD, PVC, obesity, prior SDH, atrial fibrillation on Eliquis presenting to [facility] with 1 weeks hx SOB found to have COVID PNA following full vaccination.  CT showed L posterior infarct w/ concern for BA occlusion. Transferred to [facility] for further workup.    Stroke:   L>R  posterior cirulation infarcts embolic secondary to known AF on Eliquis in setting of COVID-19 PNA  At [facility] unable to do CTA d/t contrast  allergy  MRI  Multifocal B posterior circulation infarcts, largest L PCA and L PICA  MRA head BA patent. L PCA occlusion at P1/2  Repeat CT head posterior circulation infarcts, largest L PCA and L PICA - stable   MRA neck motion - B CCA, ICA and VAs patents. No aneurysms   2D Echo pending   LDL 42  HgbA1c 7.7  VTE prophylaxis - Heparin 5000 units sq tid   clopidogrel 75 mg daily and Eliquis (apixaban) daily prior to admission, now on aspirin 325 mg daily. On  IV heparin  Drip.  Therapy recommendations:  pending   Disposition:  pending   Acute Hypoxemia Respiratory Failure  COVID-19 PNA  Steroids, remdesivir, baricifinib, vitamins  Increasing O2 needs  Change in mental status   Intubated for airway protection 11/10  Sedated  CCM  on board  Paroxysmal Atrial Fibrillation  Home anticoagulation:  clopidogrel 75 mg daily and Eliquis (apixaban) daily   On Pacerone 200 bid PTA ->100 daily   On Metoprolol 50 PTA   IV heparin  Drip till able to swallow then switch to Eliquis   Will need to continue long-term at d/c  Hypertension  Home meds:  norvasc 5, lotensin 40, lasix 40, hydralazine 75 bid, isosorbide 90 am+30pm, metoprolol 50  Stable . Permissive hypertension (OK if < 220/120) but gradually normalize in 5-7 days . Long-term BP goal normotensive  Hyperlipidemia  Home meds:  lipitor 40  Slightly elevated LFTs 64/40  Statin on hold  LDL 42, goal < 70  Continue statin once stable / discharge  Diabetes type II Uncontrolled Neuropathy  Home meds:  levemir 44u bid, linzess 145, metformin 750 w/ B  HgbA1c 7.7, goal < 7.0  CBGs Recent Labs    06/17/20 0335 06/17/20 0743 06/17/20 1122  GLUCAP 197* 169* 248*      SSI  AKI on CKD stage 3  Cre 2.63  Dysphagia . Secondary to stroke . NPO . Cortrak w/ TF  . Speech on board   Other Stroke Risk Factors  Advanced Age >/= 45   Overweight, Body mass index is 28.33 kg/m., BMI >/= 30 associated with increased stroke risk, recommend weight loss, diet and exercise as appropriate   Coronary artery disease s/p CABG 1997, stent 2017  Other Active Problems  Hx traumatic SDH  Hypothyroid on synthroid PTA, resumed  Hospital day # 3 Continue ventilatory support for respiratory failure and wean as per CCM.  Continue IV heparin drip stroke protocol to prevent further thromboembolism from his A. fib. Will need Eliquis at discharge. Patient prognosis is guarded given Covid pneumonia as well as inability to protect airway from his posterior circulation strokes. Dr. Leonie Man had a long discussion over the phone with the patient's daughter yesterday and explained his prognosis and plan of care and answered questions.  If patient's condition does not improve  significantly over the next few days family may need to make long-term decisions about trach/PEG and nursing home.   Personally examined patient and images, and have participated in and made any corrections needed to history, physical, neuro exam,assessment and plan as stated above.  I have personally obtained the history, evaluated lab date, reviewed imaging studies and agree with radiology interpretations.    Sarina Ill, MD Stroke Neurology  This patient is critically ill and at significant risk of neurological worsening, death and care requires constant monitoring of vital signs, hemodynamics,respiratory and cardiac monitoring,review of multiple databases, neurological assessment, discussion with family, other specialists  and medical decision making of high complexity.I  I spent 30 minutes of neurocritical care time in the care of this patient.     To contact Stroke Continuity provider, please refer to [email or web address] After hours, contact General Neurology

## 2020-06-18 ENCOUNTER — Inpatient Hospital Stay (HOSPITAL_COMMUNITY): Payer: Medicare Other

## 2020-06-18 DIAGNOSIS — Z66 Do not resuscitate: Secondary | ICD-10-CM | POA: Diagnosis not present

## 2020-06-18 DIAGNOSIS — I482 Chronic atrial fibrillation, unspecified: Secondary | ICD-10-CM

## 2020-06-18 DIAGNOSIS — I9589 Other hypotension: Secondary | ICD-10-CM

## 2020-06-18 DIAGNOSIS — E861 Hypovolemia: Secondary | ICD-10-CM

## 2020-06-18 DIAGNOSIS — Z4659 Encounter for fitting and adjustment of other gastrointestinal appliance and device: Secondary | ICD-10-CM

## 2020-06-18 DIAGNOSIS — D62 Acute posthemorrhagic anemia: Secondary | ICD-10-CM

## 2020-06-18 DIAGNOSIS — Z7189 Other specified counseling: Secondary | ICD-10-CM | POA: Diagnosis not present

## 2020-06-18 DIAGNOSIS — I6340 Cerebral infarction due to embolism of unspecified cerebral artery: Secondary | ICD-10-CM | POA: Diagnosis not present

## 2020-06-18 DIAGNOSIS — U071 COVID-19: Secondary | ICD-10-CM | POA: Diagnosis not present

## 2020-06-18 DIAGNOSIS — G9340 Encephalopathy, unspecified: Secondary | ICD-10-CM | POA: Diagnosis not present

## 2020-06-18 DIAGNOSIS — Z515 Encounter for palliative care: Secondary | ICD-10-CM | POA: Diagnosis not present

## 2020-06-18 DIAGNOSIS — I639 Cerebral infarction, unspecified: Secondary | ICD-10-CM | POA: Diagnosis not present

## 2020-06-18 DIAGNOSIS — K922 Gastrointestinal hemorrhage, unspecified: Secondary | ICD-10-CM

## 2020-06-18 DIAGNOSIS — J9600 Acute respiratory failure, unspecified whether with hypoxia or hypercapnia: Secondary | ICD-10-CM

## 2020-06-18 LAB — CBC
HCT: 20.5 % — ABNORMAL LOW (ref 39.0–52.0)
Hemoglobin: 6.5 g/dL — CL (ref 13.0–17.0)
MCH: 29.4 pg (ref 26.0–34.0)
MCHC: 31.7 g/dL (ref 30.0–36.0)
MCV: 92.8 fL (ref 80.0–100.0)
Platelets: 207 10*3/uL (ref 150–400)
RBC: 2.21 MIL/uL — ABNORMAL LOW (ref 4.22–5.81)
RDW: 16.2 % — ABNORMAL HIGH (ref 11.5–15.5)
WBC: 13.5 10*3/uL — ABNORMAL HIGH (ref 4.0–10.5)
nRBC: 1.5 % — ABNORMAL HIGH (ref 0.0–0.2)

## 2020-06-18 LAB — BASIC METABOLIC PANEL
Anion gap: 18 — ABNORMAL HIGH (ref 5–15)
BUN: 143 mg/dL — ABNORMAL HIGH (ref 8–23)
CO2: 12 mmol/L — ABNORMAL LOW (ref 22–32)
Calcium: 7.4 mg/dL — ABNORMAL LOW (ref 8.9–10.3)
Chloride: 113 mmol/L — ABNORMAL HIGH (ref 98–111)
Creatinine, Ser: 4.48 mg/dL — ABNORMAL HIGH (ref 0.61–1.24)
GFR, Estimated: 13 mL/min — ABNORMAL LOW (ref >60)
Glucose, Bld: 370 mg/dL — ABNORMAL HIGH (ref 70–99)
Potassium: 6.7 mmol/L (ref 3.5–5.1)
Sodium: 143 mmol/L (ref 135–145)

## 2020-06-18 LAB — PREPARE RBC (CROSSMATCH)

## 2020-06-18 LAB — POCT I-STAT EG7
Acid-base deficit: 20 mmol/L — ABNORMAL HIGH (ref 0.0–2.0)
Bicarbonate: 10.7 mmol/L — ABNORMAL LOW (ref 20.0–28.0)
Calcium, Ion: 1.03 mmol/L — ABNORMAL LOW (ref 1.15–1.40)
HCT: 21 % — ABNORMAL LOW (ref 39.0–52.0)
Hemoglobin: 7.1 g/dL — ABNORMAL LOW (ref 13.0–17.0)
O2 Saturation: 44 %
Potassium: 6.7 mmol/L (ref 3.5–5.1)
Sodium: 142 mmol/L (ref 135–145)
TCO2: 12 mmol/L — ABNORMAL LOW (ref 22–32)
pCO2, Ven: 52.5 mmHg (ref 44.0–60.0)
pH, Ven: 6.919 — CL (ref 7.250–7.430)
pO2, Ven: 40 mmHg (ref 32.0–45.0)

## 2020-06-18 LAB — GLUCOSE, CAPILLARY
Glucose-Capillary: 276 mg/dL — ABNORMAL HIGH (ref 70–99)
Glucose-Capillary: 301 mg/dL — ABNORMAL HIGH (ref 70–99)
Glucose-Capillary: 311 mg/dL — ABNORMAL HIGH (ref 70–99)
Glucose-Capillary: 320 mg/dL — ABNORMAL HIGH (ref 70–99)
Glucose-Capillary: 335 mg/dL — ABNORMAL HIGH (ref 70–99)
Glucose-Capillary: 336 mg/dL — ABNORMAL HIGH (ref 70–99)

## 2020-06-18 LAB — HEMOGLOBIN AND HEMATOCRIT, BLOOD
HCT: 25 % — ABNORMAL LOW (ref 39.0–52.0)
Hemoglobin: 7.4 g/dL — ABNORMAL LOW (ref 13.0–17.0)

## 2020-06-18 LAB — APTT: aPTT: 84 seconds — ABNORMAL HIGH (ref 24–36)

## 2020-06-18 LAB — HEPARIN LEVEL (UNFRACTIONATED): Heparin Unfractionated: 1.38 IU/mL — ABNORMAL HIGH (ref 0.30–0.70)

## 2020-06-18 LAB — ABO/RH: ABO/RH(D): A NEG

## 2020-06-18 IMAGING — CT CT HEAD W/O CM
3 series · 16 of 47 positions shown, 19 images · non-contrast
Comparison: [DATE] CT and MRI.

CLINICAL DATA: Mental status changes of unknown etiology. Unequal
pupils. Anticoagulated.

EXAM:
CT HEAD WITHOUT CONTRAST
TECHNIQUE: Contiguous axial images were obtained from the base of the skull
through the vertex without intravenous contrast.

[Series 4: head 5.0 h30s · axial · 0.43mm/px · z∈[-114,+31]mm · 10 of 35 slices shown, 13 images]
[im 3/35  brain]
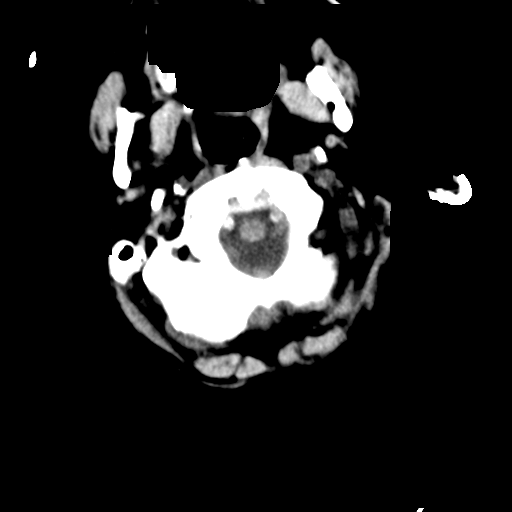
[im 3/35  bone]
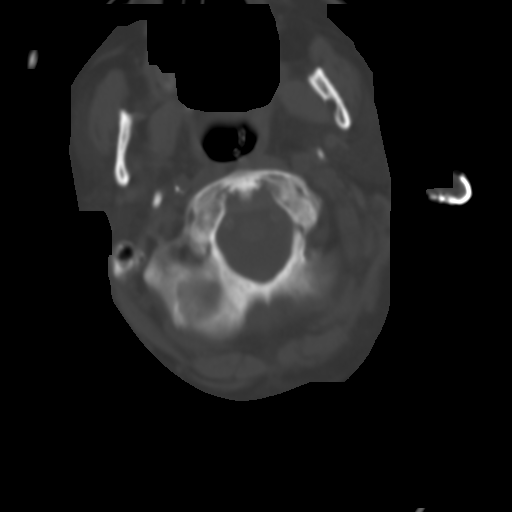
[im 6/35  brain]
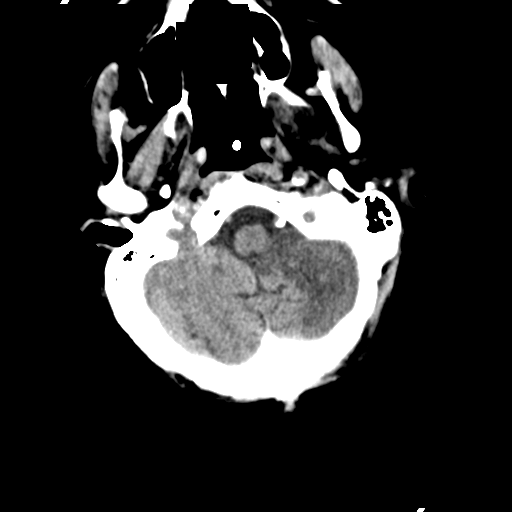
[im 10/35  brain]
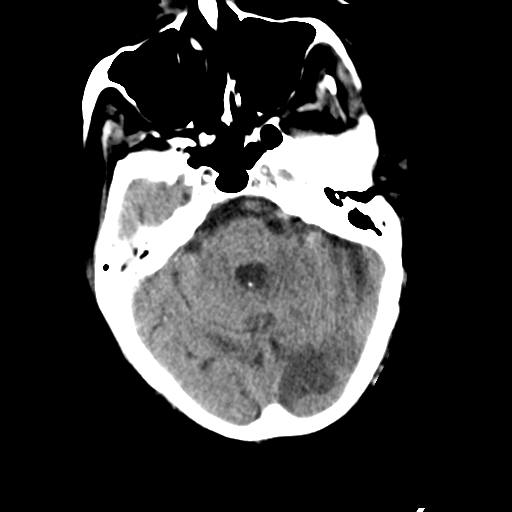
[im 12/35  brain]
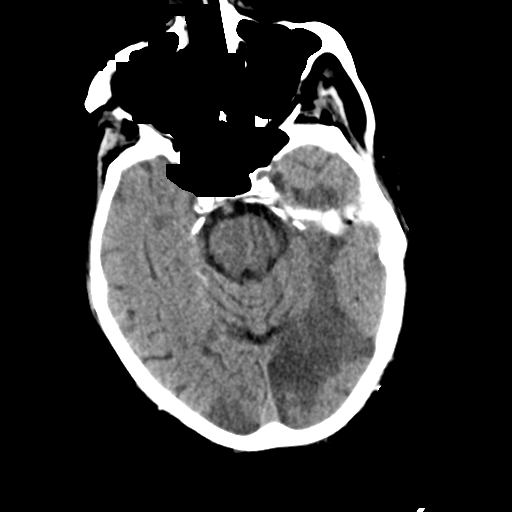
[im 16/35  brain]
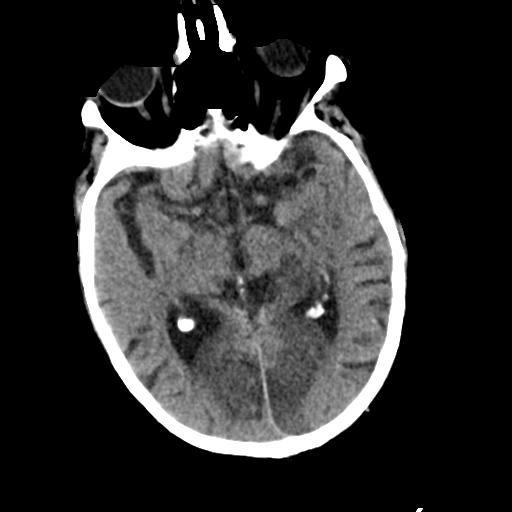
[im 16/35  bone]
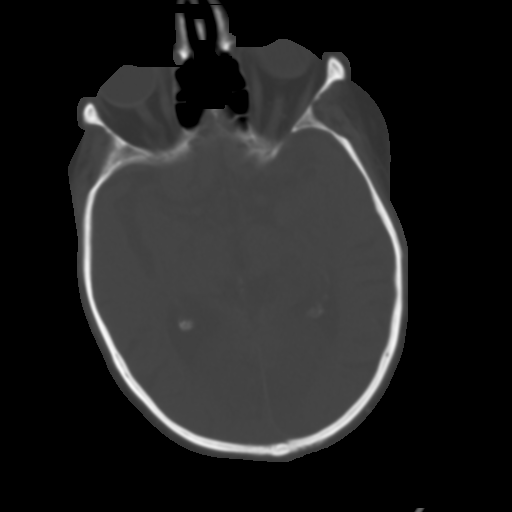
[im 19/35  brain]
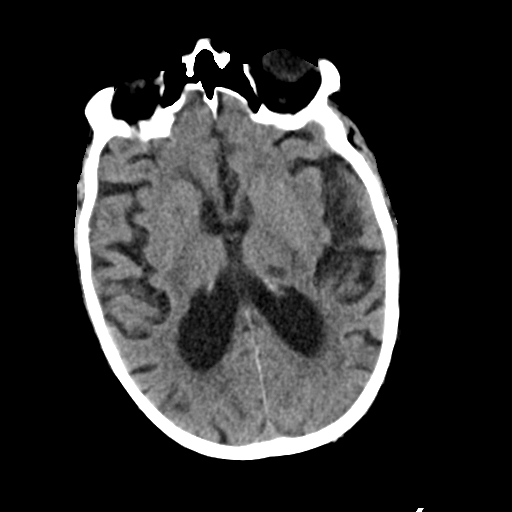
[im 23/35  brain]
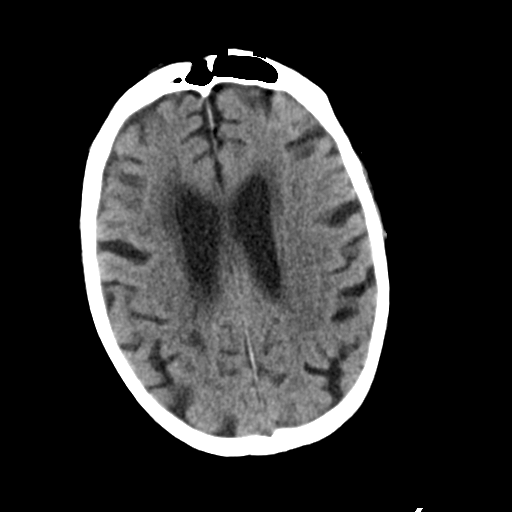
[im 26/35  brain]
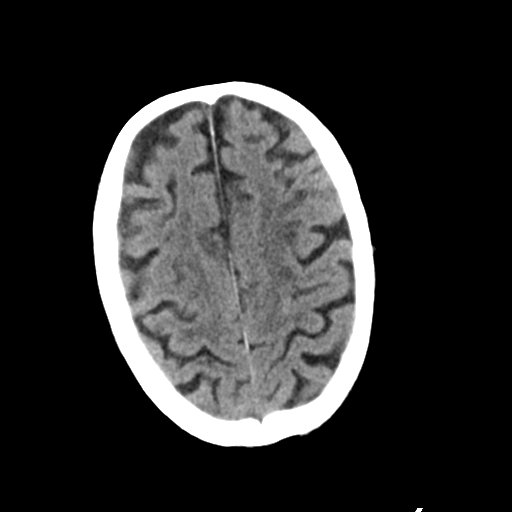
[im 29/35  brain]
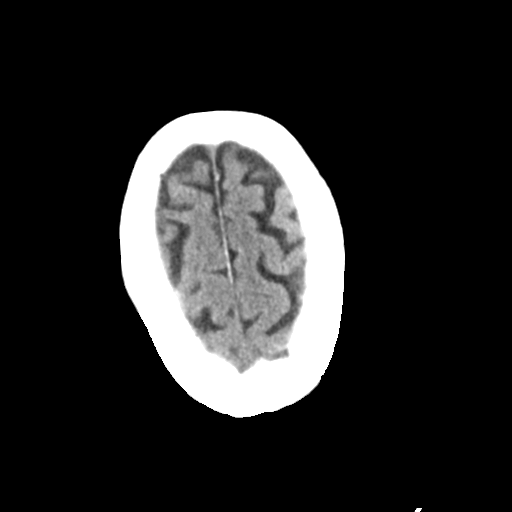
[im 29/35  bone]
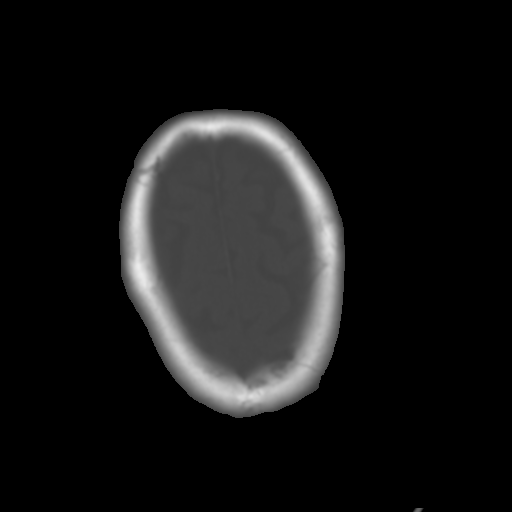
[im 32/35  brain]
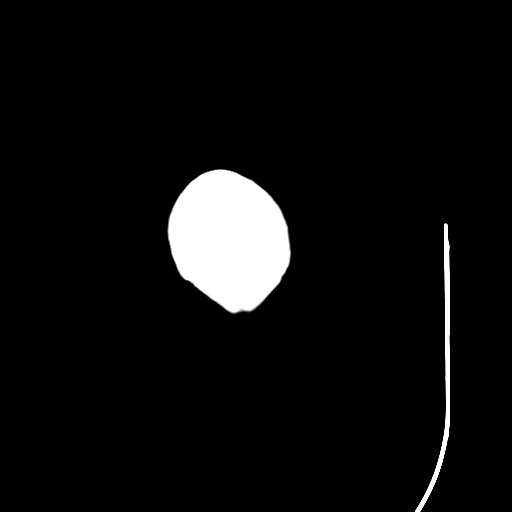

[Series 5: head 3.0 mpr cor · coronal · 0.34mm/px · 3 of 75 slices shown]
[im 25/75  brain]
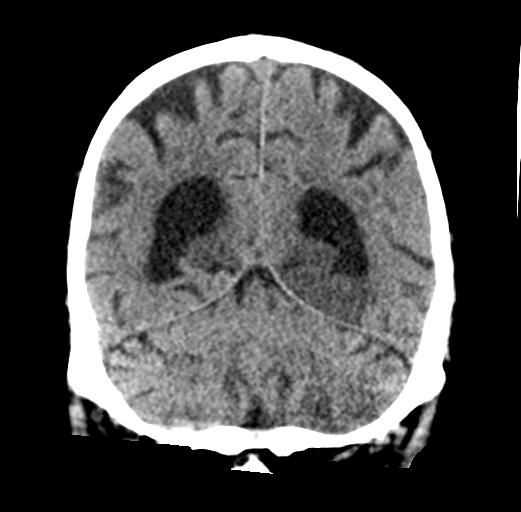
[im 33/75  brain]
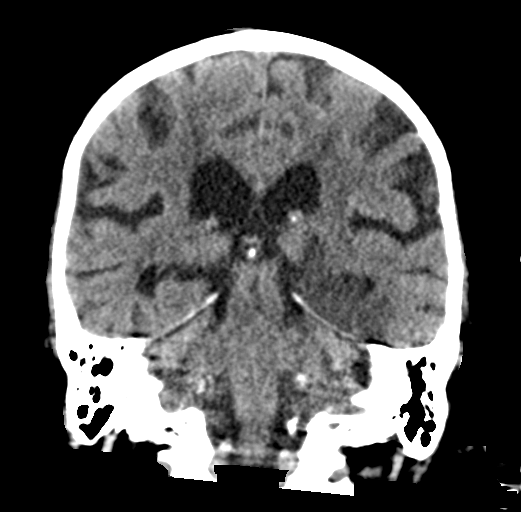
[im 42/75  brain]
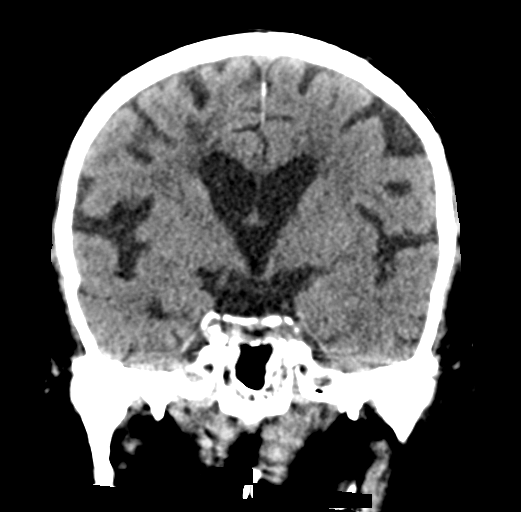

[Series 6: head 3.0 mpr sag · sagittal · 0.38mm/px · 3 of 59 slices shown]
[im 20/59  brain]
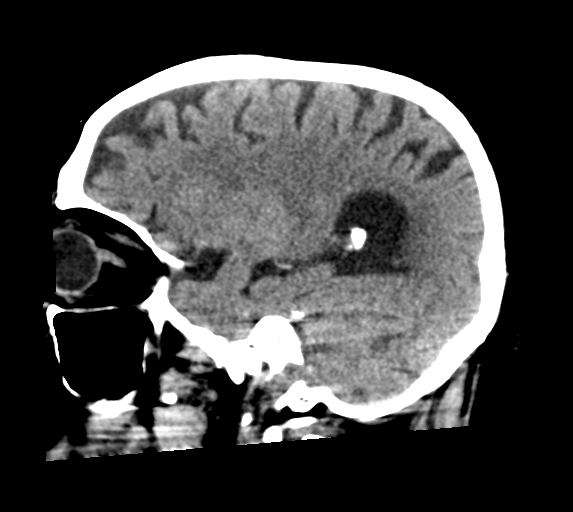
[im 30/59  brain]
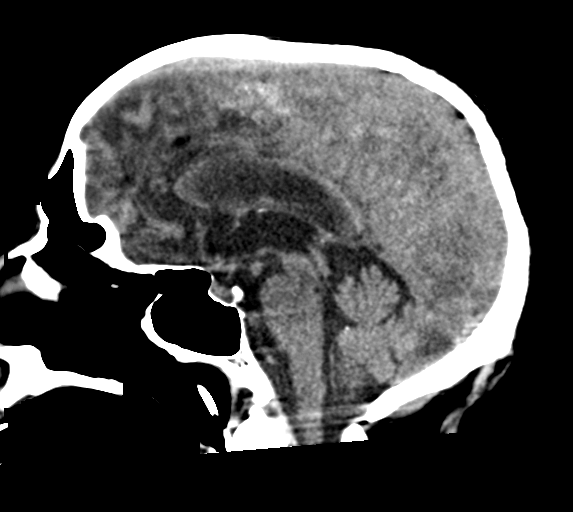
[im 39/59  brain]
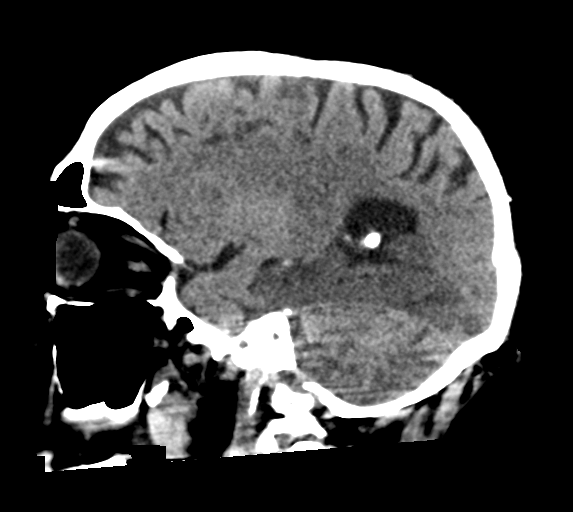

[16 of 47 positions shown; findings below may reference images not displayed]

FINDINGS: Brain: Acute infarctions affecting the cerebellum left more
extensively than right are progressive, particularly in the left
inferior cerebellar region. No evidence of hemorrhage or fourth
ventricular compromise.

Acute infarctions in both posterior cerebral artery territories are
progressive. We appear to be dealing with complete vascular
distribution infarctions today, including involvement of both
thalami I left more extensive than right. No hemorrhagic
transformation. Moderate swelling.

Other small acute infarctions within the frontal and parietal lobes
previously shown by MRI cannot be specifically demonstrated on this
noncontrast CT. There is atrophy an extensive chronic small-vessel
ischemic change elsewhere. No hydrocephalus. No extra-axial
collection.

Vascular: There is atherosclerotic calcification of the major
vessels at the base of the brain.

Skull: Normal

Sinuses/Orbits: Clear/normal

Other: Bilateral mastoid effusions.
IMPRESSION: Progressive posterior circulation infarctions affecting the
cerebellum left more than right and both posterior cerebral artery
territories slightly more extensive on the left than the right. No
evidence of hemorrhagic transformation.

## 2020-06-18 MED ORDER — NOREPINEPHRINE 4 MG/250ML-% IV SOLN
0.0000 ug/min | INTRAVENOUS | Status: DC
  Administered 2020-06-18 (×2): 50 ug/min via INTRAVENOUS
  Filled 2020-06-18: qty 250

## 2020-06-18 MED ORDER — NOREPINEPHRINE 16 MG/250ML-% IV SOLN
0.0000 ug/min | INTRAVENOUS | Status: DC
  Filled 2020-06-18: qty 250

## 2020-06-18 MED ORDER — STERILE WATER FOR INJECTION IV SOLN
Freq: Once | INTRAVENOUS | Status: AC
  Filled 2020-06-18: qty 850

## 2020-06-18 MED ORDER — SODIUM CHLORIDE 0.9% IV SOLUTION: Freq: Once | INTRAVENOUS | Status: AC

## 2020-06-18 MED ORDER — SODIUM BICARBONATE 8.4 % IV SOLN
200.0000 meq | Freq: Once | INTRAVENOUS | Status: AC
  Administered 2020-06-18: 200 meq via INTRAVENOUS
  Filled 2020-06-18: qty 200

## 2020-06-18 MED ORDER — SODIUM BICARBONATE 8.4 % IV SOLN: 100.0000 meq | Freq: Once | INTRAVENOUS | Status: AC

## 2020-06-18 MED ORDER — NOREPINEPHRINE 16 MG/250ML-% IV SOLN
0.0000 ug/min | INTRAVENOUS | Status: DC
  Administered 2020-06-19 (×2): 50 ug/min via INTRAVENOUS
  Filled 2020-06-18: qty 250
  Filled 2020-06-18: qty 500
  Filled 2020-06-18 (×2): qty 250

## 2020-06-18 MED ORDER — INSULIN ASPART 100 UNIT/ML ~~LOC~~ SOLN
0.0000 [IU] | SUBCUTANEOUS | Status: DC
  Administered 2020-06-18: 8 [IU] via SUBCUTANEOUS
  Administered 2020-06-18 (×4): 11 [IU] via SUBCUTANEOUS

## 2020-06-18 MED ORDER — EPINEPHRINE HCL 5 MG/250ML IV SOLN IN NS
INTRAVENOUS | Status: AC
  Administered 2020-06-18: 20:00:00 15 ug/min via INTRAVENOUS
  Filled 2020-06-18: qty 250

## 2020-06-18 MED ORDER — SODIUM CHLORIDE 0.9 % IV SOLN
8.0000 mg/h | INTRAVENOUS | Status: DC
  Administered 2020-06-18 (×2): 8 mg/h via INTRAVENOUS
  Filled 2020-06-18 (×2): qty 80

## 2020-06-18 MED ORDER — PHENYLEPHRINE CONCENTRATED 100MG/250ML (0.4 MG/ML) INFUSION SIMPLE
0.0000 ug/min | INTRAVENOUS | Status: DC
  Administered 2020-06-18: 170 ug/min via INTRAVENOUS
  Administered 2020-06-18: 400 ug/min via INTRAVENOUS
  Administered 2020-06-18: 100 ug/min via INTRAVENOUS
  Administered 2020-06-19: 400 ug/min via INTRAVENOUS
  Filled 2020-06-18 (×7): qty 250

## 2020-06-18 MED ORDER — PANTOPRAZOLE SODIUM 40 MG IV SOLR: 40.0000 mg | Freq: Two times a day (BID) | INTRAVENOUS | Status: DC

## 2020-06-18 MED ORDER — NOREPINEPHRINE 4 MG/250ML-% IV SOLN
INTRAVENOUS | Status: AC
  Administered 2020-06-18: 20 ug/min via INTRAVENOUS
  Filled 2020-06-18: qty 250

## 2020-06-18 MED ORDER — INSULIN ASPART 100 UNIT/ML IV SOLN
10.0000 [IU] | Freq: Once | INTRAVENOUS | Status: AC
  Administered 2020-06-18: 10 [IU] via INTRAVENOUS

## 2020-06-18 MED ORDER — SODIUM CHLORIDE 0.9 % IV BOLUS
500.0000 mL | Freq: Once | INTRAVENOUS | Status: AC
  Administered 2020-06-18: 500 mL via INTRAVENOUS

## 2020-06-18 MED ORDER — NOREPINEPHRINE BITARTRATE 1 MG/ML IV SOLN
0.0000 ug/min | INTRAVENOUS | Status: DC
  Filled 2020-06-18: qty 32

## 2020-06-18 MED ORDER — SODIUM CHLORIDE 0.9 % IV SOLN
80.0000 mg | Freq: Once | INTRAVENOUS | Status: AC
  Administered 2020-06-18: 80 mg via INTRAVENOUS
  Filled 2020-06-18: qty 80

## 2020-06-18 MED ORDER — VASOPRESSIN 20 UNITS/100 ML INFUSION FOR SHOCK
0.0000 [IU]/min | INTRAVENOUS | Status: DC
  Administered 2020-06-18: 0.04 [IU]/min via INTRAVENOUS
  Administered 2020-06-18: 0.03 [IU]/min via INTRAVENOUS
  Filled 2020-06-18 (×3): qty 100

## 2020-06-18 MED ORDER — PHENYLEPHRINE HCL-NACL 10-0.9 MG/250ML-% IV SOLN
0.0000 ug/min | INTRAVENOUS | Status: DC
  Administered 2020-06-18: 20 ug/min via INTRAVENOUS
  Filled 2020-06-18 (×2): qty 250

## 2020-06-18 MED ORDER — EPINEPHRINE HCL 5 MG/250ML IV SOLN IN NS
0.5000 ug/min | INTRAVENOUS | Status: DC
  Filled 2020-06-18 (×3): qty 250

## 2020-06-18 MED ORDER — CALCIUM GLUCONATE-NACL 1-0.675 GM/50ML-% IV SOLN
1.0000 g | Freq: Once | INTRAVENOUS | Status: AC
  Administered 2020-06-18: 1000 mg via INTRAVENOUS
  Filled 2020-06-18: qty 50

## 2020-06-18 MED ORDER — SODIUM ZIRCONIUM CYCLOSILICATE 10 G PO PACK
10.0000 g | PACK | Freq: Once | ORAL | Status: AC
  Administered 2020-06-18: 10 g
  Filled 2020-06-18: qty 1

## 2020-06-18 MED ORDER — SODIUM BICARBONATE 8.4 % IV SOLN
INTRAVENOUS | Status: AC
  Administered 2020-06-18: 100 meq via INTRAVENOUS
  Filled 2020-06-18: qty 100

## 2020-06-18 MED ORDER — DEXTROSE 50 % IV SOLN
1.0000 | Freq: Once | INTRAVENOUS | Status: AC
  Administered 2020-06-18: 50 mL via INTRAVENOUS
  Filled 2020-06-18: qty 50

## 2020-06-18 NOTE — Progress Notes (Addendum)
eLink Physician-Brief Progress Note Patient Name: [facility] DOB: Nov 04, 1940 MRN: [id number]   Date of Service  06/18/2020  HPI/Events of Note  GI bleed - Difficult situation as the patient is on a Heparin IV infusion for AFIB and  posterior circulation ischemic CVA. Now has had 3-4 dark stools. He is obviously GI bleeding. Hgb now = 6.5. To further complicate the clinical picture, his medical record contains an unknown allergy to Proton Pump Inhibitors.   eICU Interventions  Plan: 1. Hold Heparin IV infusion until patient is re-evaluated by rounding team in AM.  2. Transfuse 1 unit PRBC.  3. Protonix IV load and infusion. Will need to monitor closely for any possible  reaction to PPI.     Intervention Category Major Interventions: Other:  Athen Riel Cornelia Copa 06/18/2020, 5:03 AM

## 2020-06-18 NOTE — Progress Notes (Signed)
NAME:  Roberto Haley, MRN:  [id number], DOB:  [birth year], LOS: 4 ADMISSION DATE:  06/23/2020, CONSULTATION DATE:  11/9 REFERRING MD:  Dr. Lorrin Goodell (Neurology), CHIEF COMPLAINT:  COVID/Stroke   Brief History   79 year old male admitted to [facility] with COVID pneumonia. He had abrupt change in mental status diagnosed as stroke. Transferred to [name] 11/9 for MRI/MRA.  History of present illness   79 year old male with PMH as below, which is significant for CKD, DM, HTN, and DM2. He has received both doses of Meg[place]el vaccination. Presented to [facility] ED 11/4 with complaints of SOB with oxygen saturations found to be in the 60s on room air. COVID tested positive. He was admitted and quickly escalated to requirement of 100% NRB. Treatment regimen included decadron, remdesivir, and baricitinib. He initially  improved and oxygen was weaning down. 11/8 he had acute change in mental status and CT was concerning for stroke. 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 No CTA due to contrast allergy. The outside hospital was unable to obtain MRA for COVID positive patient, and thus he was transferred to [facility] ICU for further evaluation. He was admitted to the neurology service and PCCM was consulted for concerns of respiratory decline.   Past Medical History   has a past medical history of Arthritis, Chronic kidney disease, Coronary artery disease, Diabetes mellitus, Groin hematoma (02/19/12), Hyperlipidemia, Hypertension, Neuropathy due to type 2 diabetes mellitus ([place]), Obesity, PVC (premature ventricular contraction), and SDH (subdural hematoma) ([place]) (02/06/2013).   Significant Hospital Events   11/4 admit [name] 11/9 tx to [facility]/ admitted by Neurology  11/10 intubated given poor mental status   Consults:    Procedures:  11/10 ETT >> 11/10 R IJ CVL >> Foley   Significant Diagnostic Tests:  11/8 CT head >  Low-density in the left  occipital lobe suggesting acute to subacute infarction.  Possible infarction in inferior left cerebellum.  No hemorrhage or mass-effect.  Chronic small vessel ischemic changes elsewhere throughout the brain.  11/9 MRI/MRA >  Multifocal acute ischemia throughout both hemispheres, worst in the left PCA territory and left PICA territory Occlusion of the left PCA at the P1 P2 junction,  the basilar artery is patent  11/10 TTE  1. Left ventricular ejection fraction, by estimation, is 40 to 45%. The left ventricle has mildly decreased function. The left ventricle demonstrates global hypokinesis. There is mild left ventricular hypertrophy. Left ventricular diastolic parameters  are indeterminate.  2. Right ventricular systolic function is normal. The right ventricular  size is normal. There is mildly elevated pulmonary artery systolic pressure. The estimated right ventricular systolic pressure is 21.3 mmHg.  3. Left atrial size was moderately dilated.  4. Mitral valve regurgitation may be underestimated due to "splay" artifact. The mitral valve is degenerative. Moderate mitral valve regurgitation. No evidence of mitral stenosis.  5. Tricuspid valve regurgitation is moderate.  6. The aortic valve is calcified. There is moderate calcification of the aortic valve. There is moderate thickening of the aortic valve. Aortic valve regurgitation is mild. Mild to moderate aortic valve sclerosis/calcification is present, without any  evidence of aortic stenosis.  7. The inferior vena cava is dilated in size with <50% respiratory variability, suggesting right atrial pressure of 15 mmHg.   11/10 CTH >> Redemonstrated multifocal acute/early subacute infarcts within the supratentorial and infratentorial brain. As before, the left PCA territory (thalamus and temporal occipital lobes) and left PICA territory (cerebellum)  are most notably affected. Findings are suspicious for an embolic process. No interval  intracranial abnormality appreciable by CT. No evidence of hemorrhagic conversion. Stable background cerebral atrophy and chronic small vessel ischemic disease. Paranasal sinus disease as described. Bilateral mastoid effusions.  CT Head 06/18/2020 - pending   Micro Data:  Trach asp 11/10 >> MRSA 11/11 > neg  Antimicrobials:  Unasyn 11/10 >>  Interim history/subjective:   Hypotension overnight. Now on neo. Hgb dropped to 6.5. transfusion started.   Objective   Blood pressure (!) 92/57, pulse 75, temperature 97.9 F (36.6 C), temperature source Axillary, resp. rate (!) 22, height 5\' 8"  (1.727 m), weight 85.3 kg, SpO2 100 %. CVP:  [8 mmHg] 8 mmHg  Vent Mode: PRVC FiO2 (%):  [40 %] 40 % Set Rate:  [20 bmp] 20 bmp Vt Set:  [450 mL] 450 mL PEEP:  [5 [place] Pressure:  [20 cmH20-22 cmH20] 20 cmH20   Intake/Output Summary (Last 24 hours) at 06/18/2020 1027 Last data filed at 06/18/2020 0600 Gross per 24 hour  Intake 2207.45 ml  Output 650 ml  Net 1557.45 ml   Filed Weights   06/16/20 0500 06/17/20 0409 06/18/20 0349  Weight: 84.4 kg 84.5 kg 85.3 kg   Examination: General: elderly male, intubated on life support  HEENT: Right pupil dilated 39mm, left smaller, both non-reactive to light  Neuro: no response to painful stimuli this morning, off sedation for ~3 days  CV: Iirregular, s1 s2  PULM: BL mech vented breaths  GI: soft, nt nd  Extremities: no edema  Skin: no rash   Resolved Hospital Problem list     Assessment & Plan:   Acute hypoxemic respiratory failure secondary to COVID-10 pneumonia, bilateral infiltrates Plan: Remains on full vent support  weanign as tolerated Unable to complete SBT or SAT due to mental status  No with ongoing gi bleeding   Acute blood loss anemia  Dark stools  Acute GI bleeding  Plan: PRBCs transfusions Stopped heparin  PPI infusion    COVID-19 with with bilateral infiltrates, possible pneumonia - s/p 5 days  remdesivir -Complete course of baricitinib  - steroid taper in place   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 Erlinda Hong He will speak with family today as well   PAF, prior on Eliquis CAD s/p CABG Chronic systolic heart failure, prior ejection fraction 40 to 45% Plan: Continue amio and metoprolol  Stopped heparin due to bleeding   AKI, resolving - follow bmp and uop  Hypothyroid - continue synthroid   DM - holding metoformin cbg with ssi   Hypernatremia, mild  - FWF   Best practice:  Diet: NPO; EN per cortrak, increase rate to 40 ml/hr Pain/Anxiety/Delirium protocol (if indicated): prn fentanyl  VAP protocol (if indicated): yes DVT prophylaxis: heparin sq for now GI prophylaxis: Pepcid Glucose control: CBG monitoring and SSI Mobility: BR Code Status: full code Family Communication: team to update family. I have request neurology to speak with them today  Disposition: ICU  Labs   CBC: Recent Labs  Lab 06/15/20 0016 06/15/20 0746 06/15/20 1024 06/16/20 0424 06/18/20 0300  WBC 19.4*  --   --  10.3 13.5*  HGB 12.0* 10.9* 9.9* 14.1 6.5*  HCT 36.8* 32.0* 29.0* 47.7 20.5*  MCV 89.5  --   --  93.2 92.8  PLT 229  --   --  200 622    Basic Metabolic Panel: Recent Labs  Lab 06/15/20 0016 06/15/20 0746 06/15/20 1024 06/16/20 0424 06/17/20 0427  NA 140 143 143 150* 149*  K 4.6 4.6 4.5 4.8 4.1  CL 109  --   --  109 117*  CO2 16*  --   --  33* 21*  GLUCOSE 213*  --   --  244* 210*  BUN 61*  --   --  26*  99*  CREATININE 2.29*  2.25*  --   --  0.98 2.63*  CALCIUM 8.5*  --   --  8.6* 7.7*  MG  --   --   --  2.3  --   PHOS  --   --   --  2.6  --    GFR: Estimated Creatinine Clearance: 24.6 mL/min (A) (by C-G formula based on SCr of 2.63 mg/dL (H)). Recent Labs  Lab 06/15/20 0016 06/16/20 0424 06/17/20 0427 06/18/20 0300  PROCALCITON 0.57  --  0.37  --   WBC 19.4* 10.3  --  13.5*    Liver Function Tests: Recent Labs  Lab 06/15/20 0016 06/17/20 0427  AST 64* 48*  ALT 40 37  ALKPHOS 229* 149*  BILITOT 1.0 0.9  PROT 6.3* 4.5*  ALBUMIN 2.3* 1.6*   No results for input(s): LIPASE, AMYLASE in the last 168 hours. No results for input(s): AMMONIA in the last 168 hours.  ABG    Component Value Date/Time   PHART 7.335 (L) 06/15/2020 1024   PCO2ART 39.1 06/15/2020 1024   PO2ART 475 (H) 06/15/2020 1024   HCO3 20.9 06/15/2020 1024   TCO2 22 06/15/2020 1024   ACIDBASEDEF 5.0 (H) 06/15/2020 1024   O2SAT 100.0 06/15/2020 1024     This patient is critically ill with multiple organ system failure; which, requires frequent high complexity decision making, assessment, support, evaluation, and titration of therapies. This was completed through the application of advanced monitoring technologies and extensive interpretation of multiple databases. During this encounter critical care time was devoted to patient care services described in this note for 34 minutes.  Garner Nash, DO [place] 06/18/2020 7:14 AM

## 2020-06-18 NOTE — Progress Notes (Signed)
eLink Physician-Brief Progress Note Patient Name: Roberto Haley DOB: 05/26/1941 MRN: [id number]   Date of Service  06/18/2020  HPI/Events of Note  Hypotension - BP = 78/47 - CVP = 8. LVEF = 40-45%  eICU Interventions  Plan: 1. Bolus with 0.9 NaCl 500 mL IV over 30 minutes now.     Intervention Category Major Interventions: Hypotension - evaluation and management  Tiani Stanbery Eugene 06/18/2020, 3:30 AM

## 2020-06-18 NOTE — Progress Notes (Signed)
Ground team at bedside. Blood pressure and heart rate decreasing. Levophed and Vasopressin added with continued decreases. CVP 7.  Epinephrine drip added.  Patient not breathing on CPAP mode. Changed to [facility] by RT.

## 2020-06-18 NOTE — Progress Notes (Signed)
Patient has had several dark stools with odor of blood. Aquilla notified. Elink also notified of critical hemoglobin of 6.5. Awaiting orders.  Pharmacy notified of both secondary to heparin drip.

## 2020-06-18 NOTE — Progress Notes (Signed)
MD aware of patient condition and has been at bedside.  Temperature at 32.5C currently with warming blanket device on. No urine output for shift. Patient on multiple pressors and extremities are cold to touch. Unable to palpate pulses in extremities. Doppler utilized.  Unable to consistently monitor oxygen saturation with cardiac monitor secondary to extremity perfusion.  MD aware. Protonix stopped at this time secondary to incompatibility with pressors and Sodium bicarbonate. Verified incompatibility with pharmacy.

## 2020-06-18 NOTE — Progress Notes (Addendum)
[place] Progress Note Patient Name: Roberto Haley DOB: 1941-06-25 MRN: [id number]   Date of Service  06/18/2020  HPI/Events of Note  Hypotension and Bradycardia - BP = 54/39 with MAP = 46 and HR + 53.  eICU Interventions  Plan: 1. CVP measurement STAT. 2. Add Norepinephrine IV infusion. Titrate to MAP >= 65. 3. H/H, ABG and BMP STAT. 4. Add Vasopressin at shock dose.  5. Will request that the ground team evaluate the patient at bedside.     Intervention Category Major Interventions: Hypotension - evaluation and management;Arrhythmia - evaluation and management  Tayvien Kane [place] 06/18/2020, 7:22 PM

## 2020-06-18 NOTE — Progress Notes (Signed)
Verbal order given by Md to increase vasopressin to 0.04 units/min.

## 2020-06-18 NOTE — Consult Note (Signed)
Palliative Medicine Inpatient Consult Note  Reason for consult:  Goals of Care "Poor prognosis. Multiple medical issues. Dr Erlinda Hong spoke with daughter but she needs help with decision to progress to comfort care"  HPI:  Per intake H&P --> No family at bedside. Patient still on vent with weaning, however, not following commands or spontaneously open eyes.  Able to grimace for pain. Overnight developed hypotension, and put on Neo. Then developed 3-4 dark stools, hemoglobin dropped from 14.1 yesterday to 6.5 today.  Heparin IV discontinued.  Status post blood transfusion.  Repeat CT showed bilateral large PCA infarct, more extensive than before, possibly due to hypotension overnight.   Clinical Assessment/Goals of Care: I have reviewed medical records including EPIC notes, labs and imaging, received report from bedside RN, assessed the patient who was intubated and sedated.    I called Roberto Haley (daughter) to further discuss diagnosis prognosis, GOC, EOL wishes, disposition and options.   I introduced Palliative Medicine as specialized medical care for people living with serious illness. It focuses on providing relief from the symptoms and stress of a serious illness. The goal is to improve quality of life for both the patient and the family.  I asked Rose to tell me about Roberto Haley. She shares that he lives in [place], [place]. He is married to his wife, Roberto Haley who suffers from dementia. They have been married for the past 28 years. They share one child and two grandchildren. He owned a Therapist, sports. He gets joy out of seeing his grandchildren. He is of the [facility] [employer] [facility] denomination.  Prior to October 25th patient was fully functional. He was able to perform all bADL's and iADL's independently. He was still driving and would regularly take his wife out to eat. On October 25th he received an influenza vaccine. Per Rose it has been "downhill" since then. He has suffered from  illness and weakness secondary to what she believes is the flu shot.   A detailed discussion was had today regarding advanced directives - he does have these, his daughter is going to find them and bring in a copy to scan into our records.    Concepts specific to code status, artifical feeding and hydration, continued IV antibiotics and rehospitalization was had.  Patient shared with her that he would never want prolonged measures or aggressive life support - he has been made a DNR.  The difference between a aggressive medical intervention path  and a palliative comfort care path for this patient at this time was had. I shared that it is important we consider Roberto Haley's quality of life and the reality that he will more likely than not be unable to regain the level of function he had prior to these insults.   Roberto Haley shares that he had a "brain bleed" after a fall in 2014 and was able to fully recover from this. We discussed how he is older now and recovery from the types of infarctions he has suffered quite difficult often resulting in a completely new baseline level of functioning and in his case likely dependence on caregivers. I stated that alternatively consideration of comfort focused care may offer Roberto Haley relief and allow him to pass on more easily from this life to the next.   Rose expresses that she does not know what to do with her mother as she too needs care and she does not have access to the resources her father had to care for her. I shared I can request our  MSW reach out to discuss possible solutions.  At this point Roberto Haley states that she has a lot on her plate as her husband is ill with hypotensive episodes and her fathers brother just tragically died in a car accident. She states that she would like to speak to more family members prior to making any additional decisions.   Discussed the importance of continued conversation with family and their  medical providers regarding overall plan of care  and treatment options, ensuring decisions are within the context of the patients values and GOCs.  Decision Maker: Roberto Haley (daughter)   SUMMARY OF RECOMMENDATIONS   DNAR/DNI  Patients daughter is trying retrieve his living will to aid in decision making  [place]  MSW to help daughter decide upon how to care for her mother (patients wife) who suffers from dementia  Ongoing PMT support  Code Status/Advance Care Planning: DNAR/DNI   Palliative Prophylaxis:   Oral care, mobility,   Additional Recommendations (Limitations, Scope, Preferences):  Continue with current scope of care  Psycho-social/Spiritual:   Desire for further Chaplaincy support: Yes - [employer]  Additional Recommendations: Education on acute illness trajectory associated with present infarcts   Prognosis: Poor in the setting of multiple CVAs  Discharge Planning: Unclear  Vitals:   06/18/20 1200 06/18/20 1300  BP: 105/68 101/64  Pulse: 75 77  Resp: 16 19  Temp:    SpO2: 100% 100%    Intake/Output Summary (Last 24 hours) at 06/18/2020 1528 Last data filed at 06/18/2020 1500 Gross per 24 hour  Intake 2764.97 ml  Output 375 ml  Net 2389.97 ml   Last Weight  Most recent update: 06/18/2020  3:49 AM   Weight  85.3 kg (188 lb 0.8 oz)           Gen:  Elderly M intubated and sedated HEENT: Coretrack in place, dry mucous membranes CV: Regular rate and rhythm  PULM: Intubated ABD: soft/nontender EXT: No edema Neuro: Somnolent - sedated  PPS: 10%   This conversation/these recommendations were discussed with patient primary care team, Dr. Erlinda Hong  Time In: 1530 Time Out: 1640 Total Time: 70 Greater than 50%  of this time was spent counseling and coordinating care related to the above assessment and plan.  [facility] Team Team Cell Phone: [phone] Please utilize secure chat with additional questions, if there is no response within  30 minutes please call the above phone number  Palliative Medicine Team providers are available by phone from 7am to 7pm daily and can be reached through the team cell phone.  Should this patient require assistance outside of these hours, please call the patient's attending physician.

## 2020-06-18 NOTE — Progress Notes (Addendum)
eLink Physician-Brief Progress Note Patient Name: Roberto Haley DOB: [birth year] MRN: [id number]   Date of Service  06/18/2020  HPI/Events of Note  Hypotension - BP = 56/47 with MAP = 52. Last pH = 6.919. Currently on 4 vasopressors - Norepinephrine, Epinephrine, Phenylephrine and Vasopressin IV infusions. CVP = 7.Hyperkalemai - K+ = 6.7 - Already given Calcium gluconate. He is failing maximal hemodynamic support and prognosis is extremely poor. Patient is DNR.   eICU Interventions  Plan: 1. NaHCO3 200 meq IV now.  2. Start NaHCO3 IV infusion.  3. D50 1 amp IV now.  4. Novolog 10 units IV now.  5. 0.9 NaCl 500 mL IV over 30 minutes now.   6. Lokelma 10 gm per tube now.  7 . Ground team notified of need for further CV access.      Intervention Category Major Interventions: Hypotension - evaluation and management  Lysle Dingwall 06/18/2020, 9:55 PM

## 2020-06-18 NOTE — Progress Notes (Signed)
STROKE TEAM PROGRESS NOTE   INTERVAL HISTORY No family at bedside. Patient still on vent with weaning, however, not following commands or spontaneously open eyes.  Able to grimace for pain. Overnight developed hypotension, and put on Neo. Then developed 3-4 dark stools, hemoglobin dropped from 14.1 yesterday to 6.5 today.  Heparin IV discontinued.  Status post blood transfusion.  Repeat CT showed bilateral large PCA infarct, more extensive than before, possibly due to hypotension overnight.   Per daughter, pt had left eye laser surgery in 03/2020, since then pt has left large pupil. So this time left large pupil not new.    Vitals:   06/18/20 0515 06/18/20 0530 06/18/20 0545 06/18/20 0600  BP: (!) 84/56 93/66 (!) 88/61 (!) 92/57  Pulse: 85 82 (!) 58 75  Resp: (!) 21 (!) 24 (!) 21 (!) 22  Temp:      TempSrc:      SpO2: 100% 100% 100% 100%  Weight:      Height:       CBC:  Recent Labs  Lab 06/16/20 0424 06/18/20 0300  WBC 10.3 13.5*  HGB 14.1 6.5*  HCT 47.7 20.5*  MCV 93.2 92.8  PLT 200 700   Basic Metabolic Panel:  Recent Labs  Lab 06/16/20 0424 06/17/20 0427  NA 150* 149*  K 4.8 4.1  CL 109 117*  CO2 33* 21*  GLUCOSE 244* 210*  BUN 26* 99*  CREATININE 0.98 2.63*  CALCIUM 8.6* 7.7*  MG 2.3  --   PHOS 2.6  --    Lipid Panel:  Recent Labs  Lab 06/15/20 0016  CHOL 83  TRIG 63  HDL 28*  CHOLHDL 3.0  VLDL 13  LDLCALC 42   HgbA1c:  Recent Labs  Lab 06/15/20 0016  HGBA1C 7.7*   Urine Drug Screen: No results for input(s): LABOPIA, COCAINSCRNUR, LABBENZ, AMPHETMU, THCU, LABBARB in the last 168 hours.  Alcohol Level No results for input(s): ETH in the last 168 hours.  IMAGING past 24 hours No results found.  PHYSICAL EXAM  Temp:  [97.9 F (36.6 C)-98.9 F (37.2 C)] 97.9 F (36.6 C) (11/13 0812) Pulse Rate:  [58-107] 81 (11/13 1100) Resp:  [11-30] 16 (11/13 1100) BP: (76-121)/(47-75) 106/60 (11/13 1100) SpO2:  [95 %-100 %] 100 % (11/13 1100) FiO2 (%):   [40 %] 40 % (11/13 1100) Weight:  [85.3 kg] 85.3 kg (11/13 0349)  General - Well nourished, well developed, intubated off sedation.  Ophthalmologic - fundi not visualized due to noncooperation.  Cardiovascular - irregularly irregular heart rate and rhythm  Neuro - intubated off sedation, eyes closed, not following commands. With forced eye opening, eyes mild disconjugate with left eye slight upward deviation, chronic large left pupil 6 mm, sluggish to light, right pupil 3 mm, reactive to light. Not blinking to visual threat, doll's eyes absent, not tracking. Corneal reflex diminished on the left, weakly present on the right, gag and cough present. Breathing over the vent.  Facial symmetry not able to test due to ET tube.  Tongue protrusion not cooperative. On pain stimulation, bilateral upper extremity extension posturing, bilateral lower extremity mild withdraw. DTR 1+ and no babinski. Sensation, coordination and gait not tested.   ASSESSMENT/PLAN Roberto Haley is a 79 y.o. male with history of  DM2, CAD, HTN, HLD, PVC, obesity, prior SDH, atrial fibrillation on Eliquis presenting to [facility] with 1 weeks hx SOB found to have COVID PNA following full vaccination.  CT showed L posterior infarct  w/ concern for BA occlusion. Transferred to [facility] for further workup.    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 L PCA occlusion at P1/2  Repeat CT head posterior circulation infarcts, largest L PCA and L PICA   MRA neck motion - B CCA, ICA and VAs patents. No aneurysms   CT repeat 11/13 - progressive b/l large PCA infarcts, and L  PICA infarcts  2D Echo EF 40-45%   LDL 42  HgbA1c 7.7  VTE prophylaxis - SCDs   clopidogrel 75 mg daily and Eliquis (apixaban) daily prior to admission, was on  IV heparin drip. Heparin IV d/c'ed due to severe anemia with GIB  Disposition:  Poor prognosis, talked with daughter, will have palliative care involve.    Acute Hypoxemia Respiratory Failure  COVID-19 PNA  Steroids, remdesivir, baricifinib, vitamins  Increasing O2 needs  Change in mental status   Intubated for airway protection 11/10  Off sedation now  CCM on board  On weaning 11/13 but not candidate for extubation  PAF  Home anticoagulation:  clopidogrel 75 mg daily and Eliquis (apixaban) daily   On Pacerone 200 bid   On Metoprolol 25mg  bid   IV heparin discontinued due to severe anemia with GIB   High risk of ongoing stroke  UGIB Severe anemia  Overnight 11/12 - 3-4 dark stool  Hb 14.1->6.5->PRBC  S/p PRBC  On PPI infusion  Heparin IV discontinued  Hx of hypertension Hypotension   Home meds:  norvasc 5, lotensin 40, lasix 40, hydralazine 75 bid, isosorbide 90 am+30pm, metoprolol 50  11/12 overnight low BP . On Neo IV now . CCM on board . Long-term BP goal normotensive  Hyperlipidemia  Home meds:  lipitor 40  Slightly elevated LFTs 64/40->48/37  Statin on hold  LDL 42, goal < 70  May consdier statin on discharge  Diabetes type II Uncontrolled Neuropathy  Home meds:  levemir 44u bid, linzess 145, metformin 750 w/ B  HgbA1c 7.7, goal < 7.0  CBGs  Still hyperglycemia  On levemir  SSI  CCM on board  AKI on CKD stage 3  Cre 2.29->2.63  On TF  BMP monitoring  CCM on board  Dysphagia . Secondary to stroke . NPO . Cortrak w/ TF  . Speech on board   Other Stroke Risk Factors  Advanced Age >/= 62   Overweight, Body mass index is 28.59 kg/m., BMI >/= 30 associated with increased stroke risk, recommend weight loss, diet and exercise as appropriate    Coronary artery disease s/p CABG 1997, stent 2017  Other Active Problems  Hx traumatic SDH  Hypothyroid on synthroid PTA, resumed  Hospital day # 4  This patient is critically ill due to large posterior circulation infarcts, COVID-19 infection, Covid pneumonia with intubation, severe anemia needing blood transfusion, severe hypotension on pressors, GI bleeding on PPI, A. fib and at significant risk of neurological worsening, death form worsening respiratory failure, progressive cerebral infarction, heart failure, cardio arrest, shock, seizure. This patient's care requires constant monitoring of vital signs, hemodynamics, respiratory and cardiac monitoring, review of multiple databases, neurological assessment, discussion with family, other specialists and medical decision making of high complexity.  I spent 45 minutes of neurocritical care time in the care of this patient.  I discussed with CCM Dr. Valeta Harms.  Discussed with daughter over the phone, she is trying to find outpatient living well, will have palliative care consult to discuss with her.  Rosalin Hawking, MD PhD Stroke Neurology 06/18/2020 12:32 PM   To contact Stroke Continuity provider, please refer to [email or web address] After hours, contact General Neurology

## 2020-06-18 NOTE — Progress Notes (Signed)
Elink notified of decreased urine output and decrease in blood pressure. Current BP 80/55.

## 2020-06-18 NOTE — Progress Notes (Addendum)
Event note  Called to bedside to evaluate need for another central line to run protonix , while blood products and pressors running.  Map current 60-70 after push of 4 amps of bicarb; previously running 50's  on 4 pressors levophed at 50, vasopressor 0.04, phenylephrine at 400, epi at 20; bicarb drip and one unit prbc, bolus of IVF.  We are giving maximal therapy for hypovolemic shock with patient with poor prognosis given covid and stroke.  I discussed with daughter and notified her that we were on maximal therapy and that his death was imminent.  She understands and is making every attempt to bring her mother with dementia to hospital given rapid deterioration in patients condition.

## 2020-06-18 NOTE — Progress Notes (Signed)
Verbal order given from MD to increase Levophed to 50 mcg/min.

## 2020-06-18 NOTE — Progress Notes (Addendum)
Event note  Called down to evaluate patient by elink Pt map of 40-50's Previously on neosynephrine Started on levophed, vasopressin per elink  Continued with map of 50's vbg showed bicarb of 10, given 2 amps of bicarp; started on bicarb gtt for 1L at 115ml/hr k 6.7; plan for calcium gluconate Pending cbc with hb  Called daughter rose and update her on the situation  She continues with dnr but wants to continue to be aggressive with vasopressors  - will repeat abg in one hour  ADDENDUM HB 7.2 - WILL TRANSFUSE 1 UNIT PRBC - WILL RECHECK H/H AFTER 1 UNIT

## 2020-06-18 NOTE — Progress Notes (Signed)
Titrations of pressors per MD/patient condition. See MAR.

## 2020-06-18 NOTE — Progress Notes (Addendum)
ANTICOAGULATION CONSULT NOTE - Follow Up Consult  Pharmacy Consult for heparin Indication: atrial fibrillation and stroke  Labs: Recent Labs    06/15/20 0746 06/15/20 0746 06/15/20 1024 06/16/20 0424 06/16/20 1755 06/16/20 2014 06/17/20 0427 06/17/20 0535 06/17/20 0542 06/17/20 1658 06/18/20 0300  HGB 10.9*   < > 9.9* 14.1  --   --   --   --   --   --   --   HCT 32.0*  --  29.0* 47.7  --   --   --   --   --   --   --   PLT  --   --   --  200  --   --   --   --   --   --   --   APTT  --   --   --   --   --    < >  --  116*  --  89* 84*  HEPARINUNFRC  --   --   --   --  >2.20*  --   --   --  1.98* 1.74*  --   CREATININE  --   --   --  0.98  --   --  2.63*  --   --   --   --    < > = values in this interval not displayed.    Assessment/Plan:  79yo male therapeutic on heparin after rate changes. Will continue gtt at current rate and confirm stable with additional PTT.   Wynona Neat, PharmD, BCPS  06/18/2020,4:05 AM   Addendum: RN now reports stool suspicious for GIB and Hgb down to 6.5 from 14. Pt was at upper end of goal so there is room to move with heparin rate; will decrease to 450 units/hr and check PTT in ~6hr.   VB 4:51 AM

## 2020-06-18 NOTE — Progress Notes (Signed)
[facility] Progress Note Patient Name: Roberto Haley DOB: 1940/12/25 MRN: [id number]   Date of Service  06/18/2020  HPI/Events of Note  Hypotension and Oliguria - BP = 78/47 with MAP = 58. No CVP available.   eICU Interventions  Plan: 1. Phenylephrine IV infusion. Titrate to MAP >= 65. 2. Monitor CVP now and Q 4 hours.      Intervention Category Major Interventions: Hypotension - evaluation and management  Jennings Corado Eugene 06/18/2020, 3:21 AM

## 2020-06-19 LAB — CBC
HCT: 26.2 % — ABNORMAL LOW (ref 39.0–52.0)
Hemoglobin: 7.9 g/dL — ABNORMAL LOW (ref 13.0–17.0)
MCH: 29.6 pg (ref 26.0–34.0)
MCHC: 30.2 g/dL (ref 30.0–36.0)
MCV: 98.1 fL (ref 80.0–100.0)
Platelets: 159 10*3/uL (ref 150–400)
RBC: 2.67 MIL/uL — ABNORMAL LOW (ref 4.22–5.81)
RDW: 16.9 % — ABNORMAL HIGH (ref 11.5–15.5)
WBC: 34.6 10*3/uL — ABNORMAL HIGH (ref 4.0–10.5)
nRBC: 4.1 % — ABNORMAL HIGH (ref 0.0–0.2)

## 2020-06-19 LAB — TYPE AND SCREEN
ABO/RH(D): A NEG
Antibody Screen: NEGATIVE
Unit division: 0
Unit division: 0

## 2020-06-19 LAB — CULTURE, RESPIRATORY W GRAM STAIN: Gram Stain: NONE SEEN

## 2020-06-19 LAB — BPAM RBC
Blood Product Expiration Date: 202111252359
Blood Product Expiration Date: 202111262359
ISSUE DATE / TIME: 202111130748
ISSUE DATE / TIME: 202111132121
Unit Type and Rh: 600
Unit Type and Rh: 600

## 2020-06-19 LAB — GLUCOSE, CAPILLARY: Glucose-Capillary: 145 mg/dL — ABNORMAL HIGH (ref 70–99)

## 2020-06-19 MED ORDER — DEXTROSE 10 % IV SOLN: INTRAVENOUS | Status: DC

## 2020-07-04 ENCOUNTER — Ambulatory Visit: Payer: Medicare Other | Admitting: Podiatry

## 2020-07-06 NOTE — Progress Notes (Signed)
Daughter, Kalman Shan updated on patient continued decline. Offered her visitation and ability to camera in to patient. Daughter declined.

## 2020-07-06 NOTE — Progress Notes (Signed)
Patient time of death 54. Asystole displayed on monitor.

## 2020-07-06 NOTE — Progress Notes (Signed)
CDS notified of TOD Referral number [id number]

## 2020-07-06 NOTE — Progress Notes (Signed)
eLink Physician-Brief Progress Note Patient Name: Roberto Haley DOB: 1941-03-14 MRN: [id number]   Date of Service  07/03/2020  HPI/Events of Note  Tube feeds coming from mouth. Tube feeds via [employer] stopped by nurse. Patient on Levemir insulin. Blood glucose = 145.   eICU Interventions  Plan: 1. Place OGT to LIS 2. Hold tube feeds.  3. D10W to run IV at 30 mL/hour.      Intervention Category Major Interventions: Other:  Jevaun Strick Cornelia Copa 07/03/2020, 4:06 AM

## 2020-07-06 NOTE — Progress Notes (Signed)
Unable to palpate nor auscultate carotid pulse with doppler. Pauses on monitor.  Heart rate displayed as 36.  Elink camera assessing. Order given to stop pressors.

## 2020-07-06 NOTE — Progress Notes (Signed)
Abdomen distended. Tube feeds draining from mouth. Tube feeds immediately stopped. Mouth suctioned. Airway clear.  Left thigh, hematoma observed. Raised, warm and hardened to touch. Site of previous bruising.   Elink notified of above. Order given for gastric tube.

## 2020-07-06 NOTE — Death Summary Note (Addendum)
Death Summary    Patient ID: ison Roberto   MRN: [id number]        DOB: 05-12-1941  Date of Admission: 06/15/2020 Date of Death: 2020-06-20  Attending Physician:  No att. providers found, Stroke MD Consultant(s):   Critical Care Medicine - Rigoberto Noel, MD  ; Palliative Care Medicine - Loistine Chance, MD  Patient's PCP:  Nicoletta Dress, MD  DIAGNOSIS:  Active Problems: Occipital stroke ([place]) Pressure injury of skin COVID-19 Encephalopathy acute Palliative care by specialist Goals of care, counseling/discussion DNR (do not resuscitate)  Gi bleed with severe blood loss anemia Hypotension Cardiomyopathy - EF 40 - 45% Acute on chronic kidney injury. Chronic kidney disease stage 3 Atrial Fibrillation Anticoagulation Covid pneumonia with respiratory failure requiring ventilator support Hyperlipidemia Dysphagia secondary to stroke requiring tube feedings    Past Medical History:  Diagnosis Date   Arthritis    Chronic kidney disease    CKD STAGE 3;  CHRONIC RENAL INSUFFICIENCY   Coronary artery disease    Diabetes mellitus    Groin hematoma 02/19/12   RIGHT   Hyperlipidemia    Hypertension    Neuropathy due to type 2 diabetes mellitus (HCC)    Obesity    PVC (premature ventricular contraction)    SDH (subdural hematoma) ([place]) 02/06/2013   R parafalcine SDH after a fall, rx at [facility]   Past Surgical History:  Procedure Laterality Date   CARDIAC CATHETERIZATION  01/14/12   LV FXN EF 50-55% W/MILD DISTAL INFERIOR HYPOKINESIS; PCI: LAD PTCA/STENT W/NEW 2.75X22 RESOLUTE DES IN MID LAD POST DIALTED TO 3.08 TO 2.97 TAPER: 99%-80% TO 0; LCX: PTCA VIA LM STENT W/2.25X12 SPRINTER BALLOON: 90%-5; LAD: PATENT PROXIMAL STENT EXTENDING INTO LM W/30-40% SMOOTH NARROWING IN DISTAL PORTION OF STENT; 99% IN STENT RESTENOSIS OF MID LAD STENT    CARDIAC CATHETERIZATION N/A 12/02/2015   Procedure: Left Heart Cath and Coronary Angiography;  Surgeon: Troy Sine, MD;  Location: [place] [facility];  Service: Cardiovascular;  Laterality: N/A;   CARDIAC CATHETERIZATION N/A 12/02/2015   Procedure: Coronary Stent Intervention;  Surgeon: Troy Sine, MD;  Location: [place] [facility];  Service: Cardiovascular;  Laterality: N/A;   CARDIOVERSION N/A 09/26/2018   Procedure: CARDIOVERSION;  Surgeon: Troy Sine, MD;  Location: [facility] ENDOSCOPY;  Service: Cardiovascular;  Laterality: N/A;   CORONARY ARTERY BYPASS GRAFT  1997   LIMA to the LAD and vein to the RCA;   CORONARY STENT PLACEMENT  12/02/2015   PAD   DOPPLER ECHOCARDIOGRAPHY  01/09/12   LV NORMAL IN SIZE, NORMAL WAL THICKNESS, EF 50-55%; MILD POSTERIOR WALL HYOPKINESIS, THERE IS MILD INFERIOR WALL HYPOKINESIS   HYPOTHENAR FAT PAD TRANSFER     LEFT HEART CATHETERIZATION WITH CORONARY/GRAFT ANGIOGRAM N/A 01/14/2012   Procedure: LEFT HEART CATHETERIZATION WITH Beatrix Fetters;  Surgeon: Troy Sine, MD;  Location: [facility] CATH LAB;  Service: Cardiovascular;  Laterality: N/A;   LOWER ARTERIAL DOPPLER  01/24/12   ESSENTIALLY NORMAL RIGHT GROIN DUPLEX EVALUATION S/P THROMBIN INJECTION   LOWER VENOUS DUPLEX  02/05/12   ESSENTIALLY NORMAL RIGHT LOWER EXTRIMTY VENOUS DUPLEX DOPPLER EVALUATION   NM MYOVIEW LTD  01/09/12   HIGH RISK SCAN. COMPARED TO PREVIOUS STUDY, THERE IS NOW ISCHEMIA PRESENT. ABNORMAL MYOCARDIAL PERFUSION STUDY. THERE IS NEW MILD INFEROLATERAL ISCHEMIA TOWARDS THE BASE; PT TO FOLLOW UP WITH DR. [name]   PERCUTANEOUS CORONARY STENT INTERVENTION (PCI-S) N/A 01/14/2012   Procedure: PERCUTANEOUS CORONARY STENT INTERVENTION (  PCI-S);  Surgeon: Troy Sine, MD;  Location: [facility] CATH LAB;  Service: Cardiovascular;  Laterality: N/A;    Family History Family History  Problem Relation Age of Onset   Cancer Father    Diabetes Paternal Aunt    Heart disease Neg Hx     Social History  reports that he has never smoked. He has never used smokeless tobacco. He reports that he does not drink alcohol and does not use  drugs.   LABORATORY STUDIES CBC    Component Value Date/Time   WBC 34.6 (H) 06-23-2020 0140   RBC 2.67 (L) Jun 23, 2020 0140   HGB 7.9 (L) 23-Jun-2020 0140   HGB 12.2 (L) 09/22/2018 1405   HCT 26.2 (L) 2020-06-23 0140   HCT 37.1 (L) 09/22/2018 1405   PLT 159 2020/06/23 0140   PLT 205 09/22/2018 1405   MCV 98.1 06-23-20 0140   MCV 91 09/22/2018 1405   MCH 29.6 06/23/20 0140   MCHC 30.2 2020-06-23 0140   RDW 16.9 (H) 2020/06/23 0140   RDW 13.9 09/22/2018 1405   CMP    Component Value Date/Time   NA 142 06/18/2020 1959   NA 135 03/13/2019 1419   K 6.7 (HH) 06/18/2020 1959   CL 113 (H) 06/18/2020 1936   CO2 12 (L) 06/18/2020 1936   GLUCOSE 370 (H) 06/18/2020 1936   BUN 143 (H) 06/18/2020 1936   BUN 27 03/13/2019 1419   CREATININE 4.48 (H) 06/18/2020 1936   CREATININE 1.46 (H) 11/25/2015 1512   CALCIUM 7.4 (L) 06/18/2020 1936   PROT 4.5 (L) 06/17/2020 0427   PROT 6.6 06/02/2018 0811   ALBUMIN 1.6 (L) 06/17/2020 0427   ALBUMIN 4.2 06/02/2018 0811   AST 48 (H) 06/17/2020 0427   ALT 37 06/17/2020 0427   ALKPHOS 149 (H) 06/17/2020 0427   BILITOT 0.9 06/17/2020 0427   BILITOT 0.8 06/02/2018 0811   GFRNONAA 13 (L) 06/18/2020 1936   GFRAA 39 (L) 03/13/2019 1419   COAGS Lab Results  Component Value Date   INR 1.07 11/25/2015   INR 1.19 01/18/2012   Lipid Panel    Component Value Date/Time   CHOL 83 06/15/2020 0016   CHOL 97 (L) 06/02/2018 0811   TRIG 63 06/15/2020 0016   HDL 28 (L) 06/15/2020 0016   HDL 34 (L) 06/02/2018 0811   CHOLHDL 3.0 06/15/2020 0016   VLDL 13 06/15/2020 0016   LDLCALC 42 06/15/2020 0016   LDLCALC 47 06/02/2018 0811   HgbA1C  Lab Results  Component Value Date   HGBA1C 7.7 (H) 06/15/2020   Urinalysis No results found for: COLORURINE, APPEARANCEUR, LABSPEC, PHURINE, GLUCOSEU, HGBUR, BILIRUBINUR, KETONESUR, PROTEINUR, UROBILINOGEN, NITRITE, LEUKOCYTESUR Urine Drug Screen No results found for: LABOPIA, COCAINSCRNUR, LABBENZ, AMPHETMU,  THCU, LABBARB  Alcohol Level No results found for: [facility]   SIGNIFICANT DIAGNOSTIC STUDIES  DG Chest 1 View  Result Date: 06/15/2020 CLINICAL DATA:  79 year old male status post feeding tube placement and central line placement EXAM: CHEST  1 VIEW COMPARISON:  06/15/2020, 06/26/2020 FINDINGS: Cardiomediastinal silhouette unchanged in size and contour. Surgical changes of median sternotomy. Unchanged endotracheal tube terminating 4.1 cm above the carina. Enteric feeding tube has been placed in the interval, projecting over the mediastinum, with removal of the gastric tube. The enteric tube terminates out of the field of view. Interval placement of right IJ central venous catheter which appears to terminate superior vena cava. No pneumothorax. Multifocal airspace opacity and associated interstitial opacities. No large pleural effusion. Surgical changes of  median sternotomy IMPRESSION: Interval removal of gastric tube and placement of enteric feeding tube, which terminates in the abdomen out of the field of view. Interval placement of right IJ central venous catheter without pneumothorax. Unchanged endotracheal tube. Similar appearance of multifocal airspace and interstitial disease. Electronically Signed   By: Corrie Mckusick D.O.   On: 06/15/2020 12:29   CT HEAD WO CONTRAST  Result Date: 06/18/2020 CLINICAL DATA:  Mental status changes of unknown etiology. Unequal pupils. Anticoagulated. EXAM: CT HEAD WITHOUT CONTRAST TECHNIQUE: Contiguous axial images were obtained from the base of the skull through the vertex without intravenous contrast. COMPARISON:  06/15/2020 CT and MRI. FINDINGS: Brain: Acute infarctions affecting the cerebellum left more extensively than right are progressive, particularly in the left inferior cerebellar region. No evidence of hemorrhage or fourth ventricular compromise. Acute infarctions in both posterior cerebral artery territories are progressive. We appear to be dealing with  complete vascular distribution infarctions today, including involvement of both thalami I left more extensive than right. No hemorrhagic transformation. Moderate swelling. Other small acute infarctions within the frontal and parietal lobes previously shown by MRI cannot be specifically demonstrated on this noncontrast CT. There is atrophy an extensive chronic small-vessel ischemic change elsewhere. No hydrocephalus. No extra-axial collection. Vascular: There is atherosclerotic calcification of the major vessels at the base of the brain. Skull: Normal Sinuses/Orbits: Clear/normal Other: Bilateral mastoid effusions. IMPRESSION: Progressive posterior circulation infarctions affecting the cerebellum left more than right and both posterior cerebral artery territories slightly more extensive on the left than the right. No evidence of hemorrhagic transformation. Electronically Signed   By: Nelson Chimes M.D.   On: 06/18/2020 11:02   CT HEAD WO CONTRAST  Result Date: 06/15/2020 CLINICAL DATA:  Mental status change, unknown cause. EXAM: CT HEAD WITHOUT CONTRAST TECHNIQUE: Contiguous axial images were obtained from the base of the skull through the vertex without intravenous contrast. COMPARISON:  MRI/MRA head 06/15/2020. FINDINGS: Brain: Multifocal hypodensity at sites of known acute/early subacute infarcts within the supratentorial and infratentorial brain. As before, the left PCA territory (thalamus and temporal occipital lobe) and left PICA territory (cerebellum) are most notably affected. No interval intracranial abnormality is appreciable by CT. No evidence of hemorrhagic conversion. Stable background generalized cerebral atrophy and chronic small vessel ischemic disease. No extra-axial fluid collection. No evidence of intracranial mass. No midline shift. Vascular: No hyperdense vessel. Of note, left PCA occlusion was demonstrated on the MRA head performed earlier today. Atherosclerotic calcifications. Skull:  Normal. Negative for fracture or focal lesion. Sinuses/Orbits: Visualized orbits show no acute finding. Mild scattered paranasal sinus mucosal thickening. Frothy secretions within the right sphenoid sinus. Other: Bilateral mastoid effusions. IMPRESSION: Redemonstrated multifocal acute/early subacute infarcts within the supratentorial and infratentorial brain. As before, the left PCA territory (thalamus and temporal occipital lobes) and left PICA territory (cerebellum) are most notably affected. Findings are suspicious for an embolic process. No interval intracranial abnormality appreciable by CT. No evidence of hemorrhagic conversion. Stable background cerebral atrophy and chronic small vessel ischemic disease. Paranasal sinus disease as described. Bilateral mastoid effusions. Electronically Signed   By: Kellie Simmering DO   On: 06/15/2020 13:32   MR ANGIO HEAD WO CONTRAST  Result Date: 06/15/2020 CLINICAL DATA:  Posterior circulation infarcts EXAM: MRA HEAD WITHOUT CONTRAST TECHNIQUE: Angiographic images of the Circle of Willis were obtained using MRA technique without intravenous contrast. COMPARISON:  Brain MRI 06/12/2020 FINDINGS: POSTERIOR CIRCULATION: --Vertebral arteries: Normal --Inferior cerebellar arteries: Poor visualization due to motion. --Basilar artery: Patent. --Superior  cerebellar arteries: Normal. --Posterior cerebral arteries: Right P1 and proximal P2 segments are patent. More distally assessment is limited by motion. On the left, the PCA is occluded at the P1 P2 junction. ANTERIOR CIRCULATION: --Intracranial internal carotid arteries: Normal. --Anterior cerebral arteries (ACA): Normal. --Middle cerebral arteries (MCA): Bilaterally, the M1 and proximal M2 segments are patent. ANATOMIC VARIANTS: None IMPRESSION: 1. 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 2. Motion degraded examination, but the basilar artery is patent. Electronically Signed   By: Ulyses Jarred M.D.   On: 06/15/2020 02:58    MR ANGIO NECK WO CONTRAST  Result Date: 06/15/2020 CLINICAL DATA:  Vertebral artery aneurysm. EXAM: MRA NECK WITHOUT CONTRAST TECHNIQUE: Angiographic images of the neck were obtained using MRA technique without intravenous contrast. Carotid stenosis measurements (when applicable) are obtained utilizing NASCET criteria, using the distal internal carotid diameter as the denominator. COMPARISON:  Noncontrast head CT 06/15/2020, MRI/MRA head 06/15/2020. FINDINGS: The examination is significantly motion degraded, precluding adequate evaluation for stenoses and significantly limiting evaluation for aneurysms. The bilateral common carotid, internal carotid and vertebral arteries are patent within the neck with antegrade flow. No definite vertebral artery aneurysm is identified, although evaluation is limited for reasons described. IMPRESSION: The examination is significantly motion degraded, precluding adequate evaluation for stenoses and significantly limiting evaluation for aneurysms. The bilateral common carotid, internal carotid and vertebral arteries are patent within the neck with antegrade flow. Within described limitations, no definite vertebral artery aneurysm is identified. Electronically Signed   By: Kellie Simmering DO   On: 06/15/2020 14:07   MR BRAIN WO CONTRAST  Result Date: 06/15/2020 CLINICAL DATA:  Stroke follow-up EXAM: MRI HEAD WITHOUT CONTRAST TECHNIQUE: Multiplanar, multiecho pulse sequences of the brain and surrounding structures were obtained without intravenous contrast. COMPARISON:  None. FINDINGS: Brain: There is multifocal acute ischemia throughout both hemispheres, worst in the left PCA territory and left PICA territory. There is multifocal periventricular white matter hyperintensity, most often a result of chronic microvascular ischemia. There is generalized atrophy without lobar predilection. Vascular: Normal flow voids. Skull and upper cervical spine: Normal marrow signal.  Sinuses/Orbits: Negative. Other: None. IMPRESSION: Multifocal acute ischemia throughout both hemispheres, worst in the left PCA territory and left PICA territory. No hemorrhage or mass effect. Electronically Signed   By: Ulyses Jarred M.D.   On: 06/15/2020 01:10   DG Chest Port 1 View  Result Date: 06/17/2020 CLINICAL DATA:  Respiratory failure. EXAM: PORTABLE CHEST 1 VIEW COMPARISON:  06/15/2020 FINDINGS: The cardiac silhouette, mediastinal and hilar contours are stable. The endotracheal tube, feeding tube and right IJ catheters are stable. Persistent interstitial and airspace process in the lungs. No pleural effusions or pneumothorax. IMPRESSION: 1. Stable support apparatus. 2. Persistent interstitial and airspace process. Electronically Signed   By: Marijo Sanes M.D.   On: 06/17/2020 07:21   Portable Chest x-ray  Result Date: 06/15/2020 CLINICAL DATA:  Endotracheal tube. EXAM: PORTABLE CHEST 1 VIEW COMPARISON:  June 13, 2020. FINDINGS: Stable cardiomediastinal silhouette. Endotracheal and nasogastric tubes appear to be in good position. No pneumothorax or pleural effusion is noted. Stable patchy bilateral lung opacities are noted consistent with multifocal pneumonia. Bony thorax is unremarkable. IMPRESSION: Endotracheal and nasogastric tubes in good position. Stable patchy bilateral lung opacities consistent with multifocal pneumonia. Electronically Signed   By: Marijo Conception M.D.   On: 06/15/2020 09:38   DG Chest Port 1 View  Result Date: 06/13/2020 CLINICAL DATA:  COVID pneumonia EXAM: PORTABLE CHEST 1 VIEW  COMPARISON:  June 12, 2020 FINDINGS: There are persistent diffuse hazy bilateral airspace opacities which have improved from prior study. There is cardiomegaly. The patient is status post prior CABG. There is no pneumothorax. No large pleural effusion. The superior most sternotomy wire is again fractured. IMPRESSION: Improving bilateral multifocal airspace opacities. Electronically  Signed   By: Constance Holster M.D.   On: 06/08/2020 22:42   DG Abd Portable 1V  Result Date: 06/15/2020 CLINICAL DATA:  Feeding tube placement. EXAM: PORTABLE ABDOMEN - 1 VIEW COMPARISON:  Dec 08, 2018. FINDINGS: The bowel gas pattern is normal. Distal portion of feeding tube is seen looped within the stomach. No radio-opaque calculi or other significant radiographic abnormality are seen. IMPRESSION: Distal portion of feeding tube seen looped within the stomach. Electronically Signed   By: Marijo Conception M.D.   On: 06/15/2020 12:28   ECHOCARDIOGRAM COMPLETE  Result Date: 06/15/2020    ECHOCARDIOGRAM REPORT   Patient Name:   Roberto Haley Date of Exam: 06/15/2020 Medical Rec #:  [id number]      Height:       68.0 in Accession #:    [id number]     Weight:       200.6 lb Date of Birth:  24-Dec-1940      BSA:          2.046 m Patient Age:    23 years       BP:           118/95 mmHg Patient Gender: M              HR:           60 bpm. Exam Location:  Inpatient Procedure: 2D Echo and Intracardiac Opacification Agent Indications:    Stroke I163.9  History:        Patient has prior history of Echocardiogram examinations, most                 recent 05/28/2018. CAD, Prior CABG, COVID-19 Positive; Risk                 Factors:Hypertension, Dyslipidemia and Diabetes.  Sonographer:    Mikki Santee RDCS (AE) Referring Phys: [id number] [place]  1. Left ventricular ejection fraction, by estimation, is 40 to 45%. The left ventricle has mildly decreased function. The left ventricle demonstrates global hypokinesis. There is mild left ventricular hypertrophy. Left ventricular diastolic parameters are indeterminate.  2. Right ventricular systolic function is normal. The right ventricular size is normal. There is mildly elevated pulmonary artery systolic pressure. The estimated right ventricular systolic pressure is 89.1 mmHg.  3. Left atrial size was moderately dilated.  4. Mitral valve regurgitation  may be underestimated due to "splay" artifact. The mitral valve is degenerative. Moderate mitral valve regurgitation. No evidence of mitral stenosis.  5. Tricuspid valve regurgitation is moderate.  6. The aortic valve is calcified. There is moderate calcification of the aortic valve. There is moderate thickening of the aortic valve. Aortic valve regurgitation is mild. Mild to moderate aortic valve sclerosis/calcification is present, without any evidence of aortic stenosis.  7. The inferior vena cava is dilated in size with <50% respiratory variability, suggesting right atrial pressure of 15 mmHg. Comparison(s): Prior images unable to be directly viewed, comparison made by report only. No significant change from prior study. Prior EF 45%. Conclusion(s)/Recommendation(s): No intracardiac source of embolism detected on this transthoracic study. A transesophageal echocardiogram is recommended to exclude cardiac source of embolism if  clinically indicated. FINDINGS  Left Ventricle: Left ventricular ejection fraction, by estimation, is 40 to 45%. The left ventricle has mildly decreased function. The left ventricle demonstrates global hypokinesis. Definity contrast agent was given IV to delineate the left ventricular  endocardial borders. The left ventricular internal cavity size was normal in size. There is mild left ventricular hypertrophy. Left ventricular diastolic parameters are indeterminate. Right Ventricle: The right ventricular size is normal. No increase in right ventricular wall thickness. Right ventricular systolic function is normal. There is mildly elevated pulmonary artery systolic pressure. The tricuspid regurgitant velocity is 2.38  m/s, and with an assumed right atrial pressure of 15 mmHg, the estimated right ventricular systolic pressure is 32.5 mmHg. Left Atrium: Left atrial size was moderately dilated. Right Atrium: Right atrial size was normal in size. Pericardium: There is no evidence of pericardial  effusion. Mitral Valve: Mitral valve regurgitation may be underestimated due to "splay" artifact. The mitral valve is degenerative in appearance. Moderate mitral valve regurgitation, with eccentric anteriorly directed jet. No evidence of mitral valve stenosis. Tricuspid Valve: The tricuspid valve is normal in structure. Tricuspid valve regurgitation is moderate . No evidence of tricuspid stenosis. Aortic Valve: The aortic valve is calcified. There is moderate calcification of the aortic valve. There is moderate thickening of the aortic valve. Aortic valve regurgitation is mild. Aortic regurgitation PHT measures 665 msec. Mild to moderate aortic valve sclerosis/calcification is present, without any evidence of aortic stenosis. Pulmonic Valve: The pulmonic valve was normal in structure. Pulmonic valve regurgitation is mild. No evidence of pulmonic stenosis. Aorta: The aortic root is normal in size and structure. Venous: The inferior vena cava is dilated in size with less than 50% respiratory variability, suggesting right atrial pressure of 15 mmHg. IAS/Shunts: No atrial level shunt detected by color flow Doppler.  LEFT VENTRICLE PLAX 2D LVIDd:         4.80 cm LVIDs:         4.20 cm LV PW:         1.20 cm LV IVS:        1.20 cm LVOT diam:     2.40 cm LV SV:         59 LV SV Index:   29 LVOT Area:     4.52 cm  LEFT ATRIUM              Index       RIGHT ATRIUM           Index LA diam:        4.50 cm  2.20 cm/m  RA Area:     24.10 cm LA Vol (A2C):   110.0 ml 53.75 ml/m RA Volume:   69.50 ml  33.96 ml/m LA Vol (A4C):   49.1 ml  23.99 ml/m LA Biplane Vol: 75.6 ml  36.94 ml/m  AORTIC VALVE LVOT Vmax:   59.20 cm/s LVOT Vmean:  37.300 cm/s LVOT VTI:    0.131 m AI PHT:      665 msec  AORTA Ao Root diam: 3.50 cm TRICUSPID VALVE TR Peak grad:   22.7 mmHg TR Vmax:        238.00 cm/s  SHUNTS Systemic VTI:  0.13 m Systemic Diam: 2.40 cm Candee Furbish MD Electronically signed by Candee Furbish MD Signature Date/Time:  06/15/2020/5:16:22 PM    Final       HISTORY OF PRESENT ILLNESS (From H&P 06/24/2020 Donnetta Simpers, MD) Latina Craver is a 79 y.o. male with PMH  significant for DM2, CAD, HTN, HLD, PVC, obesity, prior SDH, atrial fibrillation on Eliquis who presents as a transfer from outside hospital for concern for potential basilar artery thrombosis/occlusion and left occipital and cerebellar hypodensity concerning for a large infarct. Per notes from the outside hospital, patient presented with 1 week history of dyspnea and found to have Covid pneumonia.  He was fully vaccinated. He was noted to have worsening confusion and potentially bilateral blindness on exam.  A CT head was obtained and demonstrated a left posterior infarct and hypodensity which was noted to be too large and focal to be an area of press.  There was concern for potential basilar occlusion given bilateral vision loss and confusion however patient has known iodinated contrast allergy and therefore vessel imaging with CT angio was not obtained.  The outside hospital was not equipped to obtained MR angios for Covid positive patients and therefore patient was transferred to [facility] for further workup. Patient is confused and moaning and unable to provide any meaningful history at the time of my evaluation. It seems like he was he was a nasal cannula at the outside hospital and maintaining saturation over 90%.  Per notes patient was full code and was treated aggressively with IV Decadron, IV remdesivir, baricitinib which was renally adjusted and COVID-19 vitamins. Hospitalization was complicated by increasing oxygen requirement on 06/10/2020 followed by worsening of the congestion on chest x-ray on 06/12/2020.  He was noted to have worsening confusion at the outside hospital and a CT head was obtained which demonstrated subacute infarct in the left occipital lobe along with a possible infarct in the inferior left cerebellum.  He was not  noted to have any hemorrhage or mass-effect.  He was continued on Eliquis and Plavix. Hospitalization was also complicated by acute on chronic kidney injury.  He was hydrated with fluids and creatinine was noted to be stable between 1.9-2.2.    HOSPITAL COURSE Mr. HEITOR STEINHOFF is a 79 y.o. male with history of  DM2, CAD, HTN, HLD, PVC, obesity, prior SDH, atrial fibrillation on Eliquis [facility] weeks hx SOB found to have COVID PNA following full vaccination.  CT showed L posterior infarct w/ concern for BA occlusion. Transferred to [facility] for further workup.     Stroke: bilateral anterior and posterior infarcts most prominent at bilateral PCA and left PICA, embolic secondary to known AF and hypercoagulable state from COVID-19 PNA, stroke progression likely due to hypotension [facility] unable to do CTA d/t contrast allergy MRI Multifocal B posterior circulation infarcts, largest L PCA and L PICA MRA head BA patent. L PCA occlusion at P1/2 Repeat CT head posterior circulation infarcts, largest L PCA and L PICA  MRA neck motion - B CCA, ICA and VAs patents. No aneurysms  CT repeat 11/13 - progressive b/l large PCA infarcts, and L PICA infarcts 2D Echo EF 40-45%  LDL 42 HgbA1c 7.7 VTE prophylaxis - SCDs  clopidogrel 75 mg daily and Eliquis (apixaban) daily prior to admission, was on  IV heparin drip. Heparin IV d/c'ed due to severe anemia with GIB Disposition:  Poor prognosis, talked with daughter, palliative care on board     Acute Hypoxemia Respiratory Failure  COVID-19 PNA Steroids, remdesivir, baricifinib, vitamins Increasing O2 needs Change in mental status  Intubated for airway protection 11/10 Off sedation  CCM on board On weaning 11/13 but not candidate for extubation   PAF Home anticoagulation:  clopidogrel 75 mg daily and Eliquis (apixaban) daily  On Pacerone 200 bid  On Metoprolol 25mg  bid  IV heparin  discontinued due to severe anemia with GIB  High risk of ongoing stroke   UGIB Severe anemia Overnight 11/12 - 3-4 dark stool Hb 14.1->6.5->PRBC S/p PRBC On PPI infusion Heparin IV discontinued   Hx of hypertension Hypotension  Home meds:  norvasc 5, lotensin 40, lasix 40, hydralazine 75 bid, isosorbide 90 am+30pm, metoprolol 50 11/12 overnight low BP On Neo IV  CCM on board  Hyperlipidemia Home meds:  lipitor 40 Slightly elevated LFTs 64/40->48/37 Statin on hold LDL 42, goal < 70   Diabetes type II Uncontrolled Neuropathy Home meds:  levemir 44u bid, linzess 145, metformin 750 w/ B HgbA1c 7.7, goal < 7.0 CBGs Still hyperglycemia On levemir SSI CCM on board   AKI on CKD stage 3 Cre 2.29->2.63 On TF BMP monitoring CCM on board   Dysphagia Secondary to stroke NPO Cortrak w/ TF  Speech on board   Other Stroke Risk Factors Advanced Age >/= 14  Overweight, Body mass index is 28.59 kg/m., BMI >/= 30 associated with increased stroke risk, recommend weight loss, diet and exercise as appropriate  Coronary artery disease s/p CABG 1997, stent 2017   Other Active Problems Hx traumatic SDH Hypothyroid on synthroid PTA, resumed  Palliative Care Consult 06/18/2020 SUMMARY OF RECOMMENDATIONS   DNAR/DNI Patients daughter is trying retrieve his living will to aid in decision making [place] MSW to help daughter decide upon how to care for her mother (patients wife) who suffers from dementia Ongoing PMT support   Code Status/Advance Care Planning: DNAR/DNI   Palliative Prophylaxis:  Oral care, mobility,    Additional Recommendations (Limitations, Scope, Preferences): Continue with current scope of care   Psycho-social/Spiritual:  Desire for further Chaplaincy support: Yes - [employer] Additional Recommendations: Education on acute illness trajectory associated with present infarcts   Prognosis: Poor in the setting of multiple  CVAs   SUMMARY  Dr Leonie Man, Dr Erlinda Hong and Dr Elsworth Soho all had discussions with the patient's daughter regarding the patients poor prognosis with virtually no hope of any meaningful recovery. The patient's condition was further complicated by a Gi bleed which precluded further anticoagulation. He remained on the ventilator and was transfused, however his hemoglobin continued to drop. A repeat CT of the head revealed extension of his stroke. The patient became extremely hypotensive. Gradually the patient's heart rate slowed to asystole on 07/18/20 at 0441. The patient's daughter and the physicians were notified.  25 minutes were spent preparing discharge.  Mikey Bussing PA-C [facility] Pager [phone] 07/18/2020, 2:46 PM

## 2020-07-06 NOTE — Progress Notes (Signed)
No pulses auscultated with doppler nor palpated.  Asystole on monitor.Elink on camera at time of death.   All drips stopped. All tubes and lines removed. Central line catheter intact. Post mortem care performed. Belongings placed in body bag and taken to morgue. Daughter, Kalman Shan notified of patient death.

## 2020-07-06 DEATH — deceased

## 2020-07-07 ENCOUNTER — Ambulatory Visit: Payer: Medicare Other | Admitting: Cardiovascular Disease

## 2020-08-09 ENCOUNTER — Ambulatory Visit: Payer: Medicare Other | Admitting: Cardiovascular Disease
